# Patient Record
Sex: Male | Born: 1965 | Race: White | Hispanic: No | Marital: Married | Smoking: Never smoker
Health system: Southern US, Community
[De-identification: ages and names within clinical notes are randomized; demographics above are authoritative.]

## PROBLEM LIST (undated history)

## (undated) DIAGNOSIS — N400 Enlarged prostate without lower urinary tract symptoms: Secondary | ICD-10-CM

## (undated) DIAGNOSIS — R51 Headache: Secondary | ICD-10-CM

## (undated) DIAGNOSIS — Z9109 Other allergy status, other than to drugs and biological substances: Secondary | ICD-10-CM

## (undated) DIAGNOSIS — K649 Unspecified hemorrhoids: Secondary | ICD-10-CM

## (undated) DIAGNOSIS — I2699 Other pulmonary embolism without acute cor pulmonale: Secondary | ICD-10-CM

## (undated) DIAGNOSIS — J40 Bronchitis, not specified as acute or chronic: Secondary | ICD-10-CM

## (undated) DIAGNOSIS — E663 Overweight: Secondary | ICD-10-CM

## (undated) DIAGNOSIS — M545 Low back pain, unspecified: Secondary | ICD-10-CM

## (undated) DIAGNOSIS — N529 Male erectile dysfunction, unspecified: Secondary | ICD-10-CM

## (undated) DIAGNOSIS — E119 Type 2 diabetes mellitus without complications: Secondary | ICD-10-CM

## (undated) DIAGNOSIS — E7800 Pure hypercholesterolemia, unspecified: Secondary | ICD-10-CM

## (undated) DIAGNOSIS — I82409 Acute embolism and thrombosis of unspecified deep veins of unspecified lower extremity: Secondary | ICD-10-CM

## (undated) DIAGNOSIS — I10 Essential (primary) hypertension: Secondary | ICD-10-CM

## (undated) DIAGNOSIS — J45909 Unspecified asthma, uncomplicated: Secondary | ICD-10-CM

## (undated) DIAGNOSIS — E291 Testicular hypofunction: Secondary | ICD-10-CM

## (undated) DIAGNOSIS — R519 Headache, unspecified: Secondary | ICD-10-CM

## (undated) HISTORY — DX: Other allergy status, other than to drugs and biological substances: Z91.09

## (undated) HISTORY — DX: Overweight: E66.3

## (undated) HISTORY — DX: Bronchitis, not specified as acute or chronic: J40

## (undated) HISTORY — DX: Benign prostatic hyperplasia without lower urinary tract symptoms: N40.0

## (undated) HISTORY — PX: CHOLECYSTECTOMY: SHX55

## (undated) HISTORY — DX: Testicular hypofunction: E29.1

## (undated) HISTORY — DX: Pure hypercholesterolemia, unspecified: E78.00

## (undated) HISTORY — DX: Essential (primary) hypertension: I10

## (undated) HISTORY — DX: Low back pain: M54.5

## (undated) HISTORY — DX: Unspecified asthma, uncomplicated: J45.909

## (undated) HISTORY — DX: Low back pain, unspecified: M54.50

## (undated) HISTORY — DX: Unspecified hemorrhoids: K64.9

## (undated) HISTORY — DX: Male erectile dysfunction, unspecified: N52.9

## (undated) HISTORY — DX: Type 2 diabetes mellitus without complications: E11.9

## (undated) SURGERY — AMPUTATION BELOW KNEE
Anesthesia: General | Site: Knee | Laterality: Right

---

## 1990-05-20 HISTORY — PX: BACK SURGERY: SHX140

## 2000-09-02 ENCOUNTER — Encounter: Admission: RE | Admit: 2000-09-02 | Discharge: 2000-09-02 | Payer: Self-pay | Admitting: Infectious Diseases

## 2007-02-09 ENCOUNTER — Ambulatory Visit: Payer: Self-pay | Admitting: Orthopaedic Surgery

## 2007-02-26 ENCOUNTER — Ambulatory Visit: Payer: Self-pay | Admitting: Unknown Physician Specialty

## 2007-03-21 ENCOUNTER — Ambulatory Visit: Payer: Self-pay | Admitting: Unknown Physician Specialty

## 2007-04-20 ENCOUNTER — Ambulatory Visit: Payer: Self-pay | Admitting: Unknown Physician Specialty

## 2007-05-21 ENCOUNTER — Ambulatory Visit: Payer: Self-pay | Admitting: Unknown Physician Specialty

## 2008-06-15 ENCOUNTER — Ambulatory Visit: Payer: Self-pay | Admitting: Family Medicine

## 2008-07-22 ENCOUNTER — Ambulatory Visit: Payer: Self-pay | Admitting: Gastroenterology

## 2008-08-30 ENCOUNTER — Ambulatory Visit: Payer: Self-pay | Admitting: Gastroenterology

## 2008-10-17 ENCOUNTER — Ambulatory Visit: Payer: Self-pay | Admitting: Unknown Physician Specialty

## 2008-10-18 ENCOUNTER — Ambulatory Visit: Payer: Self-pay | Admitting: Unknown Physician Specialty

## 2010-03-28 ENCOUNTER — Ambulatory Visit: Payer: Self-pay | Admitting: Unknown Physician Specialty

## 2011-06-27 ENCOUNTER — Ambulatory Visit: Payer: Self-pay | Admitting: Gastroenterology

## 2011-07-01 ENCOUNTER — Ambulatory Visit: Payer: Self-pay | Admitting: Gastroenterology

## 2011-07-30 ENCOUNTER — Ambulatory Visit: Payer: Self-pay | Admitting: Gastroenterology

## 2011-11-05 ENCOUNTER — Ambulatory Visit: Payer: Self-pay | Admitting: Family Medicine

## 2011-12-06 ENCOUNTER — Ambulatory Visit: Payer: Self-pay | Admitting: Gastroenterology

## 2011-12-30 ENCOUNTER — Ambulatory Visit: Payer: Self-pay | Admitting: Surgery

## 2011-12-30 DIAGNOSIS — I10 Essential (primary) hypertension: Secondary | ICD-10-CM

## 2012-01-06 ENCOUNTER — Ambulatory Visit: Payer: Self-pay | Admitting: Surgery

## 2012-01-07 LAB — PATHOLOGY REPORT

## 2012-04-09 ENCOUNTER — Ambulatory Visit: Payer: Self-pay | Admitting: Pain Medicine

## 2012-05-06 ENCOUNTER — Ambulatory Visit: Payer: Self-pay | Admitting: Family Medicine

## 2012-07-06 ENCOUNTER — Other Ambulatory Visit: Payer: Self-pay | Admitting: Neurosurgery

## 2012-07-06 DIAGNOSIS — M792 Neuralgia and neuritis, unspecified: Secondary | ICD-10-CM

## 2012-07-10 ENCOUNTER — Ambulatory Visit
Admission: RE | Admit: 2012-07-10 | Discharge: 2012-07-10 | Disposition: A | Discharge status: Home / Self Care | Payer: Medicare Other | Source: Ambulatory Visit | Attending: Neurosurgery | Admitting: Neurosurgery

## 2012-07-10 DIAGNOSIS — M792 Neuralgia and neuritis, unspecified: Secondary | ICD-10-CM

## 2012-07-10 MED ORDER — GADOBENATE DIMEGLUMINE 529 MG/ML IV SOLN
20.0000 mL | Freq: Once | INTRAVENOUS | Status: AC | PRN
  Administered 2012-07-10: 20 mL via INTRAVENOUS

## 2012-08-14 ENCOUNTER — Other Ambulatory Visit: Payer: Self-pay | Admitting: Neurosurgery

## 2012-08-14 DIAGNOSIS — M5417 Radiculopathy, lumbosacral region: Secondary | ICD-10-CM

## 2012-08-14 DIAGNOSIS — M545 Low back pain, unspecified: Secondary | ICD-10-CM

## 2012-08-14 DIAGNOSIS — M479 Spondylosis, unspecified: Secondary | ICD-10-CM

## 2012-08-20 ENCOUNTER — Other Ambulatory Visit: Payer: Medicare Other

## 2012-08-24 ENCOUNTER — Ambulatory Visit
Admission: RE | Admit: 2012-08-24 | Discharge: 2012-08-24 | Disposition: A | Discharge status: Home / Self Care | Payer: Medicare Other | Source: Ambulatory Visit | Attending: Neurosurgery | Admitting: Neurosurgery

## 2012-08-24 VITALS — BP 131/77 | HR 86 | Ht 70.0 in | Wt 264.0 lb

## 2012-08-24 DIAGNOSIS — M545 Low back pain, unspecified: Secondary | ICD-10-CM

## 2012-08-24 DIAGNOSIS — M5417 Radiculopathy, lumbosacral region: Secondary | ICD-10-CM

## 2012-08-24 DIAGNOSIS — M479 Spondylosis, unspecified: Secondary | ICD-10-CM

## 2012-08-24 MED ORDER — IOHEXOL 180 MG/ML  SOLN
18.0000 mL | Freq: Once | INTRAMUSCULAR | Status: AC | PRN
  Administered 2012-08-24: 18 mL via INTRATHECAL

## 2012-08-24 MED ORDER — DIAZEPAM 5 MG PO TABS
10.0000 mg | ORAL_TABLET | Freq: Once | ORAL | Status: AC
  Administered 2012-08-24: 10 mg via ORAL

## 2013-06-10 ENCOUNTER — Ambulatory Visit: Payer: Self-pay | Admitting: Pain Medicine

## 2013-06-18 ENCOUNTER — Ambulatory Visit: Payer: Self-pay | Admitting: Pain Medicine

## 2013-06-22 ENCOUNTER — Ambulatory Visit: Payer: Self-pay | Admitting: Pain Medicine

## 2013-07-08 ENCOUNTER — Ambulatory Visit: Payer: Self-pay | Admitting: Pain Medicine

## 2013-07-16 ENCOUNTER — Ambulatory Visit: Payer: Self-pay | Admitting: Orthopedic Surgery

## 2013-08-05 ENCOUNTER — Ambulatory Visit: Payer: Self-pay | Admitting: Pain Medicine

## 2013-08-16 ENCOUNTER — Ambulatory Visit: Payer: Self-pay | Admitting: Pain Medicine

## 2013-08-25 DIAGNOSIS — I10 Essential (primary) hypertension: Secondary | ICD-10-CM | POA: Insufficient documentation

## 2013-08-25 DIAGNOSIS — Z8719 Personal history of other diseases of the digestive system: Secondary | ICD-10-CM | POA: Insufficient documentation

## 2013-08-25 DIAGNOSIS — F32A Depression, unspecified: Secondary | ICD-10-CM | POA: Insufficient documentation

## 2013-08-25 DIAGNOSIS — M545 Low back pain, unspecified: Secondary | ICD-10-CM | POA: Insufficient documentation

## 2013-09-14 ENCOUNTER — Ambulatory Visit: Payer: Self-pay | Admitting: Pain Medicine

## 2013-09-27 ENCOUNTER — Ambulatory Visit: Payer: Self-pay | Admitting: Pain Medicine

## 2013-10-12 ENCOUNTER — Ambulatory Visit: Payer: Self-pay | Admitting: Pain Medicine

## 2013-10-25 ENCOUNTER — Ambulatory Visit: Payer: Self-pay | Admitting: Pain Medicine

## 2013-11-11 ENCOUNTER — Ambulatory Visit: Payer: Self-pay | Admitting: Pain Medicine

## 2013-12-09 ENCOUNTER — Ambulatory Visit: Payer: Self-pay | Admitting: Pain Medicine

## 2014-01-11 ENCOUNTER — Ambulatory Visit: Payer: Self-pay | Admitting: Pain Medicine

## 2014-02-09 ENCOUNTER — Ambulatory Visit: Payer: Self-pay | Admitting: Pain Medicine

## 2014-03-10 ENCOUNTER — Ambulatory Visit: Payer: Self-pay | Admitting: Pain Medicine

## 2014-04-12 ENCOUNTER — Ambulatory Visit: Payer: Self-pay | Admitting: Pain Medicine

## 2014-05-18 ENCOUNTER — Ambulatory Visit: Payer: Self-pay | Admitting: Pain Medicine

## 2014-06-14 ENCOUNTER — Ambulatory Visit: Payer: Self-pay | Admitting: Pain Medicine

## 2014-07-14 ENCOUNTER — Ambulatory Visit: Payer: Self-pay | Admitting: Pain Medicine

## 2014-08-11 ENCOUNTER — Ambulatory Visit: Payer: Self-pay | Admitting: Pain Medicine

## 2014-09-06 NOTE — Op Note (Signed)
PATIENT NAME:  Gerald Ellis, Gerald Ellis MR#:  [id number] DATE OF BIRTH:  [birth year]  DATE OF PROCEDURE:  01/06/2012  PREOPERATIVE DIAGNOSIS: Chronic cholecystitis.   POSTOPERATIVE DIAGNOSIS: Chronic cholecystitis, cholelithiasis.   PROCEDURE: Laparoscopic cholecystectomy.   SURGEON: Loreli Dollar, MD  ANESTHESIA: General.   INDICATIONS: This 49 year old male has prolonged history of nausea, abnormally low gallbladder ejection fraction of 5% and surgery was recommended for definitive treatment.   DESCRIPTION OF PROCEDURE: The patient was placed on the operating table in the supine position under general anesthesia. The abdomen was clipped and prepared with ChloraPrep, draped in a sterile manner.   A short incision was made in the inferior aspect of the umbilicus, carried down to the deep fascia which was grasped with laryngeal hook and elevated. A Veress needle was inserted, aspirated, and irrigated with a saline solution. Next, the peritoneal cavity was inflated with carbon dioxide. The Veress needle was removed. The 10 mm cannula was inserted. The 10 mm, 0 degrees laparoscope was inserted to view the peritoneal cavity. Another incision was made in the epigastrium slightly to the right of the midline to introduce an 11 mm cannula. Two incisions were made in the lateral aspect of the right upper quadrant to introduce two 5 mm cannulas.   Initial inspection revealed a fatty liver although the liver surface was smooth. The immediate omentum was noted and small portion of the stomach seen, appeared normal. Patient was turned to the reverse Trendelenburg position and turned some 5 degrees to the left. The gallbladder was retracted towards the right shoulder. Did have a thickened wall. A number of adhesions were taken down with blunt dissection. The infundibulum was retracted inferiorly and laterally. The porta hepatis was demonstrated. There was a large amount of fatty tissue attached to the neck of the  gallbladder and this fatty tissue was dissected away from the gallbladder exposing the cystic duct which was dissected free from surrounding structures. Also, the cystic artery was dissected free from surrounding structures. The gallbladder was further mobilized with incision of the visceral peritoneum. A critical view of safety was demonstrated. An endoclip was placed across the cystic duct. An incision was made in the cystic duct. An effort was made to thread in the Reddick catheter, however, the cystic duct appeared to be small in size and the Reddick catheter would not thread in therefore cholangiogram was not done. The Reddick catheter was removed. The cystic duct was doubly ligated with endoclips and divided. The cystic artery was controlled with endoclip and divided. The gallbladder was dissected free from the liver with hook and cautery. Bleeding was very minimal. Small amount of blood was aspirated. Hemostasis subsequently appeared to be intact. The gallbladder was brought up through the infraumbilical incision and opened and suctioned and with traction was removed. There was one small palpable stone within the gallbladder and the gallbladder was submitted in formalin for routine pathology. The right upper quadrant was further inspected. Hemostasis was intact. The cannulas were removed. Carbon dioxide allowed to escape from the peritoneal cavity. The fascial defect at the umbilicus was closed with a 0 Vicryl simple suture. The skin incisions were closed with interrupted 5-0 chromic subcuticular sutures, benzoin, and Steri-Strips. Dressings were applied with paper tape. The patient tolerated surgery satisfactorily and was then prepared for transfer to the recovery room.   ____________________________ Lenna Sciara. Rochel Brome, MD jws:cms D: 01/06/2012 09:03:25 ET T: 01/06/2012 11:03:36 ET JOB#: [id number]  cc: Loreli Dollar, MD, <Dictator> Loreli Dollar MD  ELECTRONICALLY SIGNED 01/09/2012 20:19

## 2014-09-13 ENCOUNTER — Ambulatory Visit
Admit: 2014-09-13 | Disposition: A | Discharge status: Home / Self Care | Payer: Self-pay | Attending: Pain Medicine | Admitting: Pain Medicine

## 2014-10-13 ENCOUNTER — Encounter: Payer: Self-pay | Admitting: Pain Medicine

## 2014-10-13 ENCOUNTER — Ambulatory Visit: Payer: Medicare Other | Attending: Pain Medicine | Admitting: Pain Medicine

## 2014-10-13 VITALS — BP 154/102 | HR 100 | Temp 98.3°F | Resp 18 | Ht 68.0 in | Wt 255.0 lb

## 2014-10-13 DIAGNOSIS — M4806 Spinal stenosis, lumbar region: Secondary | ICD-10-CM | POA: Insufficient documentation

## 2014-10-13 DIAGNOSIS — M5481 Occipital neuralgia: Secondary | ICD-10-CM

## 2014-10-13 DIAGNOSIS — Z9889 Other specified postprocedural states: Secondary | ICD-10-CM

## 2014-10-13 DIAGNOSIS — M19011 Primary osteoarthritis, right shoulder: Secondary | ICD-10-CM

## 2014-10-13 DIAGNOSIS — M19019 Primary osteoarthritis, unspecified shoulder: Secondary | ICD-10-CM | POA: Diagnosis not present

## 2014-10-13 DIAGNOSIS — M5136 Other intervertebral disc degeneration, lumbar region: Secondary | ICD-10-CM | POA: Diagnosis not present

## 2014-10-13 DIAGNOSIS — F329 Major depressive disorder, single episode, unspecified: Secondary | ICD-10-CM | POA: Insufficient documentation

## 2014-10-13 DIAGNOSIS — M5030 Other cervical disc degeneration, unspecified cervical region: Secondary | ICD-10-CM

## 2014-10-13 DIAGNOSIS — M5416 Radiculopathy, lumbar region: Secondary | ICD-10-CM | POA: Insufficient documentation

## 2014-10-13 DIAGNOSIS — M542 Cervicalgia: Secondary | ICD-10-CM | POA: Diagnosis present

## 2014-10-13 DIAGNOSIS — M51369 Other intervertebral disc degeneration, lumbar region without mention of lumbar back pain or lower extremity pain: Secondary | ICD-10-CM | POA: Insufficient documentation

## 2014-10-13 DIAGNOSIS — M545 Low back pain: Secondary | ICD-10-CM | POA: Diagnosis present

## 2014-10-13 MED ORDER — TRAMADOL HCL 50 MG PO TABS: ORAL_TABLET | ORAL | Status: DC

## 2014-10-13 MED ORDER — HYDROCODONE-ACETAMINOPHEN 5-325 MG PO TABS: ORAL_TABLET | ORAL | Status: DC

## 2014-10-13 NOTE — Patient Instructions (Addendum)
Continue present medications.  F/U PCP for evaliation of  BP and general medical  condition.. Please see your primary care physician today or this week for evaluation of blood pressure which is elevated  F/U surgical evaluation.. We have scheduled orthopedic evaluation of your hand. Please ask date of your appointment  F/U neurological evaluation.  May consider radiofrequency rhizolysis or intraspinal procedures pending response to present treatment and F/U evaluation.  [facility] to [facility] should patient have concerns prior to scheduled return appointment.  Marland Kitchenpm

## 2014-10-13 NOTE — Progress Notes (Signed)
Subjective:    Patient ID: Gerald Ellis, male    DOB: 1966/01/12, 49 y.o.   MRN: [id number]  HPI   patient is 49 year old gentleman [facility] further evaluation and treatment of pain involving the neck N regions and lower back lower extremity region. We discussed patient's condition including his blood pressure noted to be elevated on today's visit and patient stated he had not taken his antihypertensive medications. He also discuss patient's element of depression and proceed with scheduling patient for psych evaluation. The patient has been involved in litigation for very long time regarding his lower back lower extremity pain with there being discussion Re: Need for additional surgery or there being no need for additional surgery. This time we will avoid interventional treatment and continue patient's medications as prescribed as well as proceed with site evaluation and patient will follow up with primary care physician regarding his elevated blood pressure which she states is due to his not taking his medications on today's visit. The patient was understanding and agree with suggested treatment plan.      Review of Systems     Objective:   Physical Exam     palpation over the region of the cervical region cervical facet region reproduced pain of mild to moderate degree with mild to moderate tends of the splenius capitis and occipitalis muscles. Palpation of the acromioclavicular and glenohumeral joint region reproduced pain of mild to moderate degree with grip strength is slightly decreased. There was no crepitus of the thoracic region noted. Palpation over the thoracic muscles and cervical paraspinal musculature region reproduced mild to moderate discomfort.  Tinel and Phalen's maneuver without increased pain of significant degree. Patient of the lumbar paraspinal muscles and lumbar facet region reproduced severe disabling pain with limited range of motion  of the lumbar spine flexion-extension rotation and lateral bending all significantly reduced. Leg raising tolerates approximately 20 with without without a definite increase of pain with dorsiflexion noted. There was negative clonus negative Homans. There was question decreased sensation of the L5 dermatomal distribution. Decreased EHL strength noted. There was tennis over the PSIS and PII S region of moderate degree. Abdomen nontender no costovertebral angle tenderness noted.      Assessment & Plan:     degenerative disc disease of the cervical spine  Cervical facet syndrome  Occipital neuralgia   Degenerative disc disease lumbar spine  L3-4 or L4-5 and L5-S1 foraminal encroachment on the right L4-5 and L5-S1 levels. Prior L3 for bilateral laminectomy and L4-5 central stenosis and bilateral lateral recess stenosis, multifactorial L4-5 right lateral stenosis potentially affecting the S1 nerve root. status post surgical intervention of lumbar region   degenerative joint disease of shoulder  Relatively mildly displaced SLAP tear which extends into the biceps ankle or with fluid signal intensity between the subacromial and deltoid bursa and glenohumeral joint between the supraspinatus and infraspinatus tenderness suspicious for posterior supraspinatus full-thickness tear, partial tear and also insertional partial partial with tearing of the supraspinatus and infraspinatus along the articular surface with moderate supraspinatus and infraspinatus subscapularis tendinopathy   depression     Plan  Continue present medications.  F/U PCP for evaliation of  BP and general medical  Condition.   psych evaluation for elements of depression was scheduled today  F/U surgical evaluation.  F/U neurological evaluation.  May consider radiofrequency rhizolysis or intraspinal procedures pending response to present treatment and F/U evaluation.  [facility] to call [facility] should patient  have concerns prior to scheduled return appointment.

## 2014-10-13 NOTE — Progress Notes (Signed)
Discharged to home, ambulatory with script for tramadol and hydrocodone at 1134

## 2014-11-11 ENCOUNTER — Encounter: Payer: Self-pay | Admitting: *Deleted

## 2014-11-11 ENCOUNTER — Other Ambulatory Visit: Payer: Self-pay | Admitting: *Deleted

## 2014-11-15 ENCOUNTER — Ambulatory Visit: Payer: Medicare Other | Attending: Pain Medicine | Admitting: Pain Medicine

## 2014-11-15 VITALS — BP 136/93 | HR 106 | Temp 99.1°F | Resp 16 | Ht 70.0 in | Wt 255.0 lb

## 2014-11-15 DIAGNOSIS — M19019 Primary osteoarthritis, unspecified shoulder: Secondary | ICD-10-CM | POA: Diagnosis not present

## 2014-11-15 DIAGNOSIS — F419 Anxiety disorder, unspecified: Secondary | ICD-10-CM | POA: Insufficient documentation

## 2014-11-15 DIAGNOSIS — M5126 Other intervertebral disc displacement, lumbar region: Secondary | ICD-10-CM | POA: Diagnosis not present

## 2014-11-15 DIAGNOSIS — M5136 Other intervertebral disc degeneration, lumbar region: Secondary | ICD-10-CM

## 2014-11-15 DIAGNOSIS — M5481 Occipital neuralgia: Secondary | ICD-10-CM | POA: Insufficient documentation

## 2014-11-15 DIAGNOSIS — M5030 Other cervical disc degeneration, unspecified cervical region: Secondary | ICD-10-CM

## 2014-11-15 DIAGNOSIS — M4806 Spinal stenosis, lumbar region: Secondary | ICD-10-CM | POA: Insufficient documentation

## 2014-11-15 DIAGNOSIS — Z9889 Other specified postprocedural states: Secondary | ICD-10-CM

## 2014-11-15 DIAGNOSIS — M542 Cervicalgia: Secondary | ICD-10-CM | POA: Diagnosis present

## 2014-11-15 DIAGNOSIS — M19011 Primary osteoarthritis, right shoulder: Secondary | ICD-10-CM

## 2014-11-15 DIAGNOSIS — F329 Major depressive disorder, single episode, unspecified: Secondary | ICD-10-CM | POA: Diagnosis not present

## 2014-11-15 DIAGNOSIS — M5416 Radiculopathy, lumbar region: Secondary | ICD-10-CM

## 2014-11-15 DIAGNOSIS — M79601 Pain in right arm: Secondary | ICD-10-CM | POA: Diagnosis present

## 2014-11-15 DIAGNOSIS — M79602 Pain in left arm: Secondary | ICD-10-CM | POA: Diagnosis present

## 2014-11-15 MED ORDER — TRAMADOL HCL 50 MG PO TABS: ORAL_TABLET | ORAL | Status: DC

## 2014-11-15 MED ORDER — HYDROCODONE-ACETAMINOPHEN 5-325 MG PO TABS: ORAL_TABLET | ORAL | Status: DC

## 2014-11-15 NOTE — Progress Notes (Signed)
   Subjective:    Patient ID: Gerald Ellis, male    DOB: April 27, 1966, 49 y.o.   MRN: [id number]  HPI  Patient is 49 year old gentleman returns [facility] for further evaluation and treatment of pain involving the neck upper extremity regions headache lower back and lower extremity pain. Patient is continuing his negotiations with insurance company regarding need for more surgery of the lumbar region. Patient continues litigation in this regard. As patient's overall condition and patient appears to be with elements of depression and we will proceed with psych evaluation and will continue present medications of hydrocodone acetaminophen and tramadol. We will avoid interventional treatment as discussed with patient. The patient was understanding and in agreement with suggested treatment plan.     Review of Systems     Objective:   Physical Exam  There was tenderness over the splenius capitis and occipitalis musculature region of mild degree no new lesions of the head and neck were noted. There was tenderness over the cervical facet cervical paraspinal musculature region and thoracic facet thoracic paraspinal musculature region of moderate degree. Palpation of the acromioclavicular glenohumeral joint region was a tends to palpation of moderate degree with limited range of motion of the shoulders. Patient had some difficulty performing the drop test. Palpation over the thoracic facet thoracic paraspinal musculature region was a tends to palpation of mild degree with no crepitus of the thoracic region noted. Palpation over the lumbar paraspinal muscles region lumbar facet region was severely limited with extension and palpation of the lumbar facets reproducing severe discomfort. Straight leg raising was tolerates approximately 20 without an increase of pain with dorsiflexion noted. EHL strength appeared to be decreased definite sensory deficit of dermatomal distribution was detected and  there was negative clonus negative Homans. DTRs are difficult to elicit patient had developed relaxing. Mild tenderness of the greater trochanteric region iliotibial band region. Abdomen nontender and no costovertebral angle tenderness noted.      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  Degenerative joint disease of shoulder Mildly displaced SLAP tear that extends into the biceps anchor with fluid signal intensity between these's of arachnoid deltoid bursa and the glenohumeral joint between the supraspinatus and infraspinatus tendons suspicious for a posterior supraspinatus full-thickness tear, partial tear and was also insertional partial with tearing of the supraspinatus and infraspinatus along the articular surface with moderate supraspinatus and if spinatus subscapularis tendinopathy  Bilateral greater occipital neuralgia  Depression/Anxiety      Plan    Continue present medications tramadol and hydrocodone acetaminophen  F/U PCP Dr. Kary Kos for evaliation of  BP and general medical  condition.  F/U surgical evaluation  F/U neurological evaluation  Psych evaluation scheduled for evaluation of elements of depression/anxiety. We feel that psych evaluation including psych medications prescribed may be helpful in terms of reducing patient's pain and severity of symptoms  May consider radiofrequency rhizolysis or intraspinal procedures pending response to present treatment and F/U evaluation.  Patient to call [facility] should patient have concerns prior to scheduled return appointment.

## 2014-11-15 NOTE — Patient Instructions (Addendum)
Continue present medications tramadol and hydrocodone acetaminophen  F/U PCP for evaliation of  BP and general medical  condition.  F/U surgical evaluation.  F/U neurological evaluation.  Please obtain the date for your psych evaluation prior to discharge today  May consider radiofrequency rhizolysis or intraspinal procedures pending response to present treatment and F/U evaluation.  Patient to call [facility] should patient have concerns prior to scheduled return appointment.

## 2014-11-15 NOTE — Progress Notes (Signed)
Safety precautions to be maintained throughout the outpatient stay will include: orient to surroundings, keep bed in low position, maintain call bell within reach at all times, provide assistance with transfer out of bed and ambulation.  

## 2014-11-22 ENCOUNTER — Ambulatory Visit (INDEPENDENT_AMBULATORY_CARE_PROVIDER_SITE_OTHER): Payer: Self-pay | Admitting: Urology

## 2014-11-22 ENCOUNTER — Encounter: Payer: Self-pay | Admitting: Urology

## 2014-11-22 VITALS — BP 162/103 | HR 52 | Ht 70.0 in | Wt 234.0 lb

## 2014-11-22 DIAGNOSIS — N138 Other obstructive and reflux uropathy: Secondary | ICD-10-CM | POA: Insufficient documentation

## 2014-11-22 DIAGNOSIS — E291 Testicular hypofunction: Secondary | ICD-10-CM | POA: Insufficient documentation

## 2014-11-22 DIAGNOSIS — N401 Enlarged prostate with lower urinary tract symptoms: Secondary | ICD-10-CM

## 2014-11-22 DIAGNOSIS — N529 Male erectile dysfunction, unspecified: Secondary | ICD-10-CM | POA: Insufficient documentation

## 2014-11-22 DIAGNOSIS — N528 Other male erectile dysfunction: Secondary | ICD-10-CM

## 2014-11-22 LAB — BLADDER SCAN AMB NON-IMAGING: SCAN RESULT: 30

## 2014-11-22 MED ORDER — TESTOSTERONE CYPIONATE 200 MG/ML IM SOLN
200.0000 mg | Freq: Once | INTRAMUSCULAR | Status: AC
  Administered 2014-11-22: 200 mg via INTRAMUSCULAR

## 2014-11-22 MED ORDER — VARDENAFIL HCL 10 MG PO TABS: 10.0000 mg | ORAL_TABLET | Freq: Every day | ORAL | Status: DC | PRN

## 2014-11-22 NOTE — Progress Notes (Signed)
Testosterone IM Injection  Due to Hypogonadism patient is present today for a Testosterone Injection.  Medication: Testosterone Cypionate Dose: 24mL Location: right upper outer buttocks Lot: [id number] Exp:11/2015  Patient tolerated well, no complications were noted  Preformed by: Toniann Fail, LPN   Follow up: 3 weeks

## 2014-11-22 NOTE — Progress Notes (Signed)
11/22/2014 10:26 AM   [name] 05/08/1966 [id number]  Referring provider: No referring provider defined for this encounter.  Chief Complaint  Patient presents with  . Benign Prostatic Hypertrophy    6 month follow up  . Hypogonadism    HPI: Mr. Sereno is a 49 year old white male with hypogonadism, erectile dysfunction and BPH with LUTS who presents today for his biannual office visit.  Today, his IPS S score is 6/3, which is mild symptomatology. His PVR today is 30 mL.  He is not experiencing any dysuria, suprapubic pain or gross hematuria. He also denies any fevers, chills, nausea or vomiting.  PSA history:    0.3 ng/mL on 01/28/2013    0.5 ng/mL on 06/28/2013    0.3 ng/mL on 11/18/2013    0.4 ng/mL on 05/23/2014     IPSS      11/22/14 1000       International Prostate Symptom Score   How often have you had the sensation of not emptying your bladder? Not at All     How often have you had to urinate less than every two hours? About half the time     How often have you found you stopped and started again several times when you urinated? Not at All     How often have you found it difficult to postpone urination? Not at All     How often have you had a weak urinary stream? Not at All     How often have you had to strain to start urination? Not at All     How many times did you typically get up at night to urinate? None     Total IPSS Score 3     Quality of Life due to urinary symptoms   If you were to spend the rest of your life with your urinary condition just the way it is now how would you feel about that? Delighted        Score:  1-7 Mild 8-19 Moderate 20-35 Severe   Patient's hypogonadism is currently being treated with testosterone cypionate injections receiving 400 mg IM every 3 weeks. His last injection was the end of May.      Androgen Deficiency in the Aging Male      11/22/14 1000       Androgen Deficiency in the Aging Male   Do you  have a decrease in libido (sex drive) No     Do you have lack of energy Yes     Do you have a decrease in strength and/or endurance Yes     Have you lost height Yes     Have you noticed a decreased "enjoyment of life" Yes     Are you sad and/or grumpy Yes     Are your erections less strong Yes     Have you noticed a recent deterioration in your ability to play sports Yes     Are you falling asleep after dinner No     Has there been a recent deterioration in your work performance Yes        Patient's SH IM score today is 11. This is a moderate erectile dysfunction. He responds well to PDE 5 inhibitors, but he finds them cost prohibitive. His current sexual partner, his wife, is not interested in sexual activity.     SHIM      11/22/14 1017       SHIM: Over the last 6 months:  How do you rate your confidence that you could get and keep an erection? Low     When you had erections with sexual stimulation, how often were your erections hard enough for penetration (entering your partner)? A Few Times (much less than half the time)     During sexual intercourse, how often were you able to maintain your erection after you had penetrated (entered) your partner? Very Difficult     During sexual intercourse, how difficult was it to maintain your erection to completion of intercourse? Very Difficult     When you attempted sexual intercourse, how often was it satisfactory for you? Difficult     SHIM Total Score   SHIM 11        Score: 1-7 Severe ED 8-11 Moderate ED 12-16 Mild-Moderate ED 17-21 Mild ED 22-25 No ED   PMH: Past Medical History  Diagnosis Date  . Hypercholesteremia   . Hypertension   . Diabetes mellitus without complication   . Hemorrhoids   . Environmental allergies   . Lumbago   . Childhood asthma   . Hypogonadism in male   . Over weight   . Benign enlargement of prostate   . Erectile dysfunction     Surgical History: Past Surgical History  Procedure  Laterality Date  . Cholecystectomy    . Back surgery  1992    Home Medications:    Medication List       This list is accurate as of: 11/22/14 10:26 AM.  Always use your most recent med list.               cyclobenzaprine 10 MG tablet  Commonly known as:  FLEXERIL  Take 10 mg by mouth at bedtime.     DHA-EPA-VITAMIN E PO  Take by mouth.     diclofenac sodium 1 % Gel  Commonly known as:  VOLTAREN  Apply 2-4 g topically 4 (four) times daily.     fluticasone 50 MCG/ACT nasal spray  Commonly known as:  FLONASE  Place 1 spray into both nostrils daily.     FREESTYLE LITE test strip  Generic drug:  glucose blood  1 strip 3 (three) times daily before meals.     gabapentin 300 MG capsule  Commonly known as:  NEURONTIN  Take 300 mg by mouth at bedtime. 2 capsules at bedtime     glipiZIDE 10 MG tablet  Commonly known as:  GLUCOTROL  Take 10 mg by mouth daily before breakfast.     HYDROcodone-acetaminophen 5-325 MG per tablet  Commonly known as:  NORCO/VICODIN  Limit 2-3 tabs per mouth per day if tolerated     insulin aspart 100 UNIT/ML injection  Commonly known as:  novoLOG  Inject into the skin.     insulin detemir 100 UNIT/ML injection  Commonly known as:  LEVEMIR  Inject 40 Units into the skin at bedtime.     insulin lispro 100 UNIT/ML injection  Commonly known as:  HUMALOG  Inject 10 Units into the skin 3 (three) times daily before meals.     insulin NPH Human 100 UNIT/ML injection  Commonly known as:  HUMULIN N,NOVOLIN N  Inject into the skin.     insulin regular 100 units/mL injection  Commonly known as:  NOVOLIN R,HUMULIN R  14 units three times a day. Take 10 minutes before meals.     lisinopril 20 MG tablet  Commonly known as:  PRINIVIL,ZESTRIL  Take 20 mg by mouth 2 (two) times  daily.     metFORMIN 1000 MG tablet  Commonly known as:  GLUCOPHAGE  Take 1,000 mg by mouth 2 (two) times daily with a meal.     MULTI-VITAMINS Tabs  Take by mouth.       pantoprazole 40 MG tablet  Commonly known as:  PROTONIX  TAKE 1 TABLET BY MOUTH ONCE A DAY     pioglitazone 45 MG tablet  Commonly known as:  ACTOS  Take 45 mg by mouth daily.     simvastatin 40 MG tablet  Commonly known as:  ZOCOR  Take 40 mg by mouth daily.     testosterone cypionate 200 MG/ML injection  Commonly known as:  DEPOTESTOSTERONE CYPIONATE  Inject 200 mg into the muscle every 21 ( twenty-one) days.     traMADol 50 MG tablet  Commonly known as:  ULTRAM  Limit 1 tab by mouth twice a day 2 to 3 times a day if tolerated        Allergies:  Allergies  Allergen Reactions  . Ibuprofen Shortness Of Breath    Family History: Family History  Problem Relation Age of Onset  . Heart disease Mother     CABG  . Cancer Mother     Disseminated  . Diabetes Father     mother  . Alzheimer's disease    . Hypertension Mother   . Hyperlipidemia Mother   . Kidney disease Neg Hx   . Prostate cancer Neg Hx     Social History:  reports that he has never smoked. He has never used smokeless tobacco. He reports that he drinks alcohol. He reports that he does not use illicit drugs.  ROS: Urological Symptom Review  Patient is experiencing the following symptoms: Frequent urination Get up at night to urinate Erection problems (male only)   Review of Systems  Gastrointestinal (upper)  : Negative for upper GI symptoms  Gastrointestinal (lower) : Negative for lower GI symptoms  Constitutional : Negative for symptoms  Skin: Negative for skin symptoms  Eyes: Negative for eye symptoms  Ear/Nose/Throat : Negative for Ear/Nose/Throat symptoms  Hematologic/Lymphatic: Negative for Hematologic/Lymphatic symptoms  Cardiovascular : Negative for cardiovascular symptoms  Respiratory : Negative for respiratory symptoms  Endocrine: Negative for endocrine symptoms  Musculoskeletal: Back pain Joint pain  Neurological: Negative for neurological  symptoms  Psychologic: Negative for psychiatric symptoms   Physical Exam: BP 162/103 mmHg  Pulse 52  Ht 5\' 10"  (1.778 m)  Wt 234 lb (106.142 kg)  BMI 33.58 kg/m2  GU: Patient with uncircumcised phallus. Foreskin easily retracted  Urethral meatus is patent.  No penile discharge. No penile lesions or rashes. Scrotum without lesions, cysts, rashes and/or edema.  Testicles are located scrotally bilaterally. No masses are appreciated in the testicles. Left and right epididymis are normal. Rectal: Patient with  normal sphincter tone. Perineum without scarring or rashes. No rectal masses are appreciated. Prostate is approximately 45 grams, no nodules are appreciated. Seminal vesicles are normal.  Laboratory Data: Results for orders placed or performed in visit on 11/22/14  BLADDER SCAN AMB NON-IMAGING  Result Value Ref Range   Scan Result 30    No results found for: WBC, HGB, HCT, MCV, PLT  No results found for: CREATININE  No results found for: PSA  No results found for: TESTOSTERONE  No results found for: HGBA1C  Urinalysis No results found for: COLORURINE, APPEARANCEUR, LABSPEC, PHURINE, GLUCOSEU, HGBUR, BILIRUBINUR, KETONESUR, PROTEINUR, UROBILINOGEN, NITRITE, LEUKOCYTESUR  Pertinent Imaging:   Assessment & Plan:  1. BPH (benign prostatic hyperplasia) with LUTS:   Patient's IPS S score is 6/0. His PVR is 30 mL. Recheck his IPS S score, PVR, DRE and PSA in 6 months time.  - PSA - BLADDER SCAN AMB NON-IMAGING  2. Hypogonadism in male:   Patient's  hypogonadism is currently being managed with testosterone cypionate injections. He is receiving 400 mg IM every 3 weeks. He will have his morning testosterone checked every 3 months. His hematocrit will be checked on a biannual basis when he presents for office visit for his exam.  - Hematocrit  3. Erectile dysfunction:   Patient's SHIM score is 11. He responds well to PDE5 inhibitors, but finds them cost prohibitive. His wife  is not interested in sexual activity. He does not desire a prescription for PDE5 inhibitors because of the cost and his wife's lack of interest at this time.   Patient requested a presciprtion of Levitra after he received his injection.  No Follow-up on file.  Zara Council, [facility] [address] [place], [place] [address] [phone]

## 2014-11-23 ENCOUNTER — Telehealth: Payer: Self-pay

## 2014-11-23 DIAGNOSIS — E291 Testicular hypofunction: Secondary | ICD-10-CM

## 2014-11-23 LAB — HEMATOCRIT: HEMATOCRIT: 44.2 % (ref 37.5–51.0)

## 2014-11-23 LAB — PSA: Prostate Specific Ag, Serum: 0.4 ng/mL (ref 0.0–4.0)

## 2014-11-23 NOTE — Telephone Encounter (Signed)
-----   Message from Nori Riis, PA-C sent at 11/23/2014  8:09 AM EDT ----- Labs are good.  We will need to check a morning testosterone ( 8am-10am) in three months.

## 2014-11-23 NOTE — Telephone Encounter (Signed)
Spoke with pt and made aware of lab results. Pt is going to call back to make a lab appt. Future orders have been placed in epic. Cw,lpn

## 2014-12-08 ENCOUNTER — Encounter: Payer: Self-pay | Admitting: Pain Medicine

## 2014-12-08 ENCOUNTER — Ambulatory Visit: Payer: Medicare Other | Attending: Pain Medicine | Admitting: Pain Medicine

## 2014-12-08 VITALS — BP 151/89 | HR 97 | Temp 98.6°F | Resp 16 | Ht 70.0 in | Wt 255.0 lb

## 2014-12-08 DIAGNOSIS — M5416 Radiculopathy, lumbar region: Secondary | ICD-10-CM

## 2014-12-08 DIAGNOSIS — M5481 Occipital neuralgia: Secondary | ICD-10-CM | POA: Diagnosis not present

## 2014-12-08 DIAGNOSIS — M19019 Primary osteoarthritis, unspecified shoulder: Secondary | ICD-10-CM | POA: Insufficient documentation

## 2014-12-08 DIAGNOSIS — M51369 Other intervertebral disc degeneration, lumbar region without mention of lumbar back pain or lower extremity pain: Secondary | ICD-10-CM

## 2014-12-08 DIAGNOSIS — M5030 Other cervical disc degeneration, unspecified cervical region: Secondary | ICD-10-CM

## 2014-12-08 DIAGNOSIS — M19012 Primary osteoarthritis, left shoulder: Secondary | ICD-10-CM

## 2014-12-08 DIAGNOSIS — M19011 Primary osteoarthritis, right shoulder: Secondary | ICD-10-CM

## 2014-12-08 DIAGNOSIS — M542 Cervicalgia: Secondary | ICD-10-CM | POA: Diagnosis present

## 2014-12-08 DIAGNOSIS — M5136 Other intervertebral disc degeneration, lumbar region: Secondary | ICD-10-CM | POA: Diagnosis not present

## 2014-12-08 DIAGNOSIS — F329 Major depressive disorder, single episode, unspecified: Secondary | ICD-10-CM | POA: Insufficient documentation

## 2014-12-08 DIAGNOSIS — M4806 Spinal stenosis, lumbar region: Secondary | ICD-10-CM | POA: Insufficient documentation

## 2014-12-08 DIAGNOSIS — F419 Anxiety disorder, unspecified: Secondary | ICD-10-CM | POA: Insufficient documentation

## 2014-12-08 DIAGNOSIS — Z9889 Other specified postprocedural states: Secondary | ICD-10-CM

## 2014-12-08 DIAGNOSIS — M545 Low back pain: Secondary | ICD-10-CM | POA: Diagnosis present

## 2014-12-08 DIAGNOSIS — R51 Headache: Secondary | ICD-10-CM | POA: Diagnosis present

## 2014-12-08 MED ORDER — TRAMADOL HCL 50 MG PO TABS: ORAL_TABLET | ORAL | Status: DC

## 2014-12-08 MED ORDER — HYDROCODONE-ACETAMINOPHEN 5-325 MG PO TABS: ORAL_TABLET | ORAL | Status: DC

## 2014-12-08 NOTE — Progress Notes (Signed)
Safety precautions to be maintained throughout the outpatient stay will include: orient to surroundings, keep bed in low position, maintain call bell within reach at all times, provide assistance with transfer out of bed and ambulation.  

## 2014-12-08 NOTE — Patient Instructions (Signed)
Continue present medications tramadol and hydrocodone acetaminophen   F/U PCP  Dr. Kary Kos  for evaliation of  BP and general medical  condition.  F/U surgical evaluation as discussed  F/U neurological evaluation  May consider radiofrequency rhizolysis or intraspinal procedures pending response to present treatment and F/U evaluation.  [facility] to [facility] should patient have concerns prior to scheduled return appointment.

## 2014-12-08 NOTE — Progress Notes (Signed)
Subjective:    Patient ID: Gerald Ellis, male    DOB: [birth year], 49 y.o.   MRN: [id number]  HPI  Patient is 49 year old gentleman [facility] further evaluation and treatment of pain involving the neck headaches pain of the upper mid and lower back regions and lower extremity regions. Patient is without significant change in condition at this time. Patient is continuing to undergo evaluation with there being concern regarding the need for additional surgery versus the need to avoid additional surgery. Patient has been involved in litigation in this regard. Patient denies any trauma change in events of daily living the call significant change in symptomatology. We will continue presently prescribed medications at this time and will remain available to consider modification of treatment should there be significant change in patient's condition. The patient was understanding and in agreement with suggested treatment plan.    Review of Systems     Objective:   Physical Exam  There was tends to palpation splenius capitis and occipitalis musculature region of mild degree. There was tenderness over the cervical facet cervical paraspinal musculature region of mild to moderate degree. There was moderate tends to palpation of the acromioclavicular glenohumeral joint region with limited range of motion of the acromioclavicular glenohumeral joint region and difficulty performing the drop test. There was decreased grip strength. Tinel and Phalen's maneuver without increased pain of significant degree. There appeared to be unremarkable Spurling's maneuver. Palpation of the thoracic facet thoracic paraspinal muscular region was with evidence of significant muscle spasms in the lower thoracic paraspinal musculature. Palpation over the lumbar paraspinal muscles region lumbar facet region was increased pain with extension and palpation of the lumbar facets reproducing moderate to  moderately severe discomfort. Lateral bending and rotation extension and palpation of the lumbar facets reproduce moderate to moderately severe discomfort. Straight leg raising limited to approximately 20 without increased pain with dorsiflexion noted. There was negative clonus negative Homans. DTRs difficult to elicit patient had difficulty relaxing. There was mild tenderness of the greater trochanteric region. There was negative clonus negative Homans. Abdomen was nontender with no costovertebral angle tenderness noted.         Assessment & Plan:   degenerative disc disease lumbar spine  Progress Notes   KATRINA BROSH (MR# [id number])      Progress Notes Info    Author Note Status Last Update User Last Update Date/Time   Mohammed Kindle, MD Signed Mohammed Kindle, MD 11/15/2014 4:24 PM    Progress Notes    Expand All Collapse All     Subjective:    Patient ID: Gerald Ellis, male DOB: 01-Dec-1965, 49 y.o. MRN: [id number]  HPI  Patient is 49 year old gentleman returns [facility] for further evaluation and treatment of pain involving the neck upper extremity regions headache lower back and lower extremity pain. Patient is continuing his negotiations with insurance company regarding need for more surgery of the lumbar region. Patient continues litigation in this regard. As patient's overall condition and patient appears to be with elements of depression and we will proceed with psych evaluation and will continue present medications of hydrocodone acetaminophen and tramadol. We will avoid interventional treatment as discussed with patient. The patient was understanding and in agreement with suggested treatment plan.     Review of Systems     Objective:   Physical Exam  There was tenderness over the splenius capitis and occipitalis musculature region of mild degree no new lesions of  the head and neck were noted. There was tenderness over the cervical  facet cervical paraspinal musculature region and thoracic facet thoracic paraspinal musculature region of moderate degree. Palpation of the acromioclavicular glenohumeral joint region was a tends to palpation of moderate degree with limited range of motion of the shoulders. Patient had some difficulty performing the drop test. Palpation over the thoracic facet thoracic paraspinal musculature region was a tends to palpation of mild degree with no crepitus of the thoracic region noted. Palpation over the lumbar paraspinal muscles region lumbar facet region was severely limited with extension and palpation of the lumbar facets reproducing severe discomfort. Straight leg raising was tolerates approximately 20 without an increase of pain with dorsiflexion noted. EHL strength appeared to be decreased definite sensory deficit of dermatomal distribution was detected and there was negative clonus negative Homans. DTRs are difficult to elicit patient had developed relaxing. Mild tenderness of the greater trochanteric region iliotibial band region. Abdomen nontender and no costovertebral angle tenderness noted.      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  Degenerative joint disease of shoulder Mildly displaced SLAP tear that extends into the biceps anchor with fluid signal intensity between these's of arachnoid deltoid bursa and the glenohumeral joint between the supraspinatus and infraspinatus tendons suspicious for a posterior supraspinatus full-thickness tear, partial tear and was also insertional partial with tearing of the  supraspinatus and infraspinatus along the articular surface with moderate supraspinatus and if spinatus subscapularis tendinopathy  Bilateral greater occipital neuralgia  Depression/Anxiety      Plan    Continue present medications tramadol and hydrocodone acetaminophen  F/U PCP Dr. Kary Kos for evaliation of BP and general medical condition.  F/U surgical evaluation  F/U neurological evaluation  Psych evaluation as discussed  May consider radiofrequency rhizolysis or intraspinal procedures pending response to present treatment and F/U evaluation.  [facility] to call [facility] should patient have concerns prior to scheduled return appointment.

## 2014-12-12 ENCOUNTER — Encounter: Payer: Medicare Other | Admitting: Pain Medicine

## 2014-12-13 ENCOUNTER — Ambulatory Visit: Payer: Self-pay | Admitting: Urology

## 2014-12-20 ENCOUNTER — Other Ambulatory Visit: Payer: Self-pay | Admitting: Pain Medicine

## 2014-12-20 ENCOUNTER — Ambulatory Visit (INDEPENDENT_AMBULATORY_CARE_PROVIDER_SITE_OTHER): Payer: Medicare Other

## 2014-12-20 DIAGNOSIS — E291 Testicular hypofunction: Secondary | ICD-10-CM | POA: Diagnosis not present

## 2014-12-20 MED ORDER — TESTOSTERONE CYPIONATE 200 MG/ML IM SOLN
200.0000 mg | Freq: Once | INTRAMUSCULAR | Status: AC
  Administered 2014-12-20: 200 mg via INTRAMUSCULAR

## 2014-12-20 NOTE — Progress Notes (Signed)
Testosterone IM Injection  Due to Hypogonadism patient is present today for a Testosterone Injection.  Medication: Testosterone Cypionate Dose: 53mL Location: right upper outer buttocks Lot: [id number] Exp:11/2015  Patient tolerated well, no complications were noted  Preformed by: Toniann Fail, LPN  Follow up: 3 weeks

## 2014-12-30 ENCOUNTER — Other Ambulatory Visit: Payer: Self-pay

## 2014-12-30 DIAGNOSIS — Z7952 Long term (current) use of systemic steroids: Secondary | ICD-10-CM | POA: Diagnosis not present

## 2014-12-30 DIAGNOSIS — J209 Acute bronchitis, unspecified: Secondary | ICD-10-CM | POA: Insufficient documentation

## 2014-12-30 DIAGNOSIS — Z79899 Other long term (current) drug therapy: Secondary | ICD-10-CM | POA: Diagnosis not present

## 2014-12-30 DIAGNOSIS — Z791 Long term (current) use of non-steroidal anti-inflammatories (NSAID): Secondary | ICD-10-CM | POA: Insufficient documentation

## 2014-12-30 DIAGNOSIS — N481 Balanitis: Secondary | ICD-10-CM | POA: Diagnosis not present

## 2014-12-30 DIAGNOSIS — I10 Essential (primary) hypertension: Secondary | ICD-10-CM | POA: Diagnosis not present

## 2014-12-30 DIAGNOSIS — E1165 Type 2 diabetes mellitus with hyperglycemia: Secondary | ICD-10-CM | POA: Diagnosis not present

## 2014-12-30 DIAGNOSIS — Z794 Long term (current) use of insulin: Secondary | ICD-10-CM | POA: Insufficient documentation

## 2014-12-30 DIAGNOSIS — Z792 Long term (current) use of antibiotics: Secondary | ICD-10-CM | POA: Diagnosis not present

## 2014-12-30 DIAGNOSIS — R42 Dizziness and giddiness: Secondary | ICD-10-CM | POA: Diagnosis present

## 2014-12-30 NOTE — ED Notes (Signed)
Pt states he was treated for the flu 6 weeks ago but never got rid of the cough, states he went to the PCP at [facility] on Wednesday and place on 2 abx, cipro and amoxicillin..states being on them has had dizziness and confusion, just not felt himself with redness of the face and a tingling sensation, HA.Marland Kitchen

## 2014-12-30 NOTE — ED Notes (Signed)
Patient was seen by his doctor and labs were done there before he was sent to the emergency department.Triage  RN was able to pull lab results up, because patient requested we use the results from his doctor.

## 2014-12-31 ENCOUNTER — Emergency Department
Admission: EM | Admit: 2014-12-31 | Discharge: 2014-12-31 | Disposition: A | Discharge status: Home / Self Care | Payer: Medicare Other | Attending: Emergency Medicine | Admitting: Emergency Medicine

## 2014-12-31 DIAGNOSIS — E1165 Type 2 diabetes mellitus with hyperglycemia: Secondary | ICD-10-CM

## 2014-12-31 DIAGNOSIS — J40 Bronchitis, not specified as acute or chronic: Secondary | ICD-10-CM

## 2014-12-31 DIAGNOSIS — N481 Balanitis: Secondary | ICD-10-CM

## 2014-12-31 LAB — URINALYSIS COMPLETE WITH MICROSCOPIC (ARMC ONLY)
Bacteria, UA: NONE SEEN
Bilirubin Urine: NEGATIVE
Glucose, UA: >500 mg/dL — AB
HGB URINE DIPSTICK: NEGATIVE
KETONES UR: NEGATIVE mg/dL
LEUKOCYTES UA: NEGATIVE
NITRITE: NEGATIVE
Protein, ur: NEGATIVE mg/dL
SPECIFIC GRAVITY, URINE: 1.005 (ref 1.005–1.030)
pH: 5 (ref 5.0–8.0)

## 2014-12-31 LAB — BASIC METABOLIC PANEL
Anion gap: 10 (ref 5–15)
BUN: 6 mg/dL (ref 6–20)
CALCIUM: 8.8 mg/dL — AB (ref 8.9–10.3)
CHLORIDE: 101 mmol/L (ref 101–111)
CO2: 24 mmol/L (ref 22–32)
CREATININE: 0.7 mg/dL (ref 0.61–1.24)
GFR calc Af Amer: >60 mL/min (ref >60)
GLUCOSE: 199 mg/dL — AB (ref 65–99)
POTASSIUM: 3.2 mmol/L — AB (ref 3.5–5.1)
SODIUM: 135 mmol/L (ref 135–145)

## 2014-12-31 LAB — CBC
HCT: 45 % (ref 40.0–52.0)
Hemoglobin: 15.4 g/dL (ref 13.0–18.0)
MCH: 31.5 pg (ref 26.0–34.0)
MCHC: 34.2 g/dL (ref 32.0–36.0)
MCV: 91.9 fL (ref 80.0–100.0)
PLATELETS: 219 10*3/uL (ref 150–440)
RBC: 4.89 MIL/uL (ref 4.40–5.90)
RDW: 14.2 % (ref 11.5–14.5)
WBC: 10.2 10*3/uL (ref 3.8–10.6)

## 2014-12-31 MED ORDER — SODIUM CHLORIDE 0.9 % IV BOLUS (SEPSIS)
1000.0000 mL | Freq: Once | INTRAVENOUS | Status: AC
  Administered 2014-12-31: 1000 mL via INTRAVENOUS

## 2014-12-31 MED ORDER — FLUCONAZOLE 100 MG PO TABS
100.0000 mg | ORAL_TABLET | Freq: Once | ORAL | Status: AC
  Administered 2014-12-31: 100 mg via ORAL
  Filled 2014-12-31: qty 1

## 2014-12-31 NOTE — Discharge Instructions (Signed)
Your cough is likely due to a viral bronchitis. The redness and swelling of your penis could be from yeast and/or bacteria. Continue the antibiotic in and I used medication. Continue your current diabetes medications. Follow-up with your doctor this coming week. Return to the emergency department if you feel worse or have other urgent concerns.  Balanitis Balanitis is inflammation of the head of the penis (glans).  CAUSES  Balanitis has multiple causes, both infectious and noninfectious. Often balanitis is the result of poor personal hygiene, especially in uncircumcised males. Without adequate washing, viruses, bacteria, and yeast collect between the foreskin and the glans. This can cause an infection. Lack of air and irritation from a normal secretion called smegma contribute to the cause in uncircumcised males. Other causes include:  Chemical irritation from the use of certain soaps and shower gels (especially soaps with perfumes), condoms, personal lubricants, petroleum jelly, spermicides, and fabric conditioners.  Skin conditions, such as eczema, dermatitis, and psoriasis.  Allergies to drugs, such as tetracycline and sulfa.  Certain medical conditions, including liver cirrhosis, congestive heart failure, and kidney disease.  Morbid obesity. RISK FACTORS  Diabetes mellitus.  A tight foreskin that is difficult to pull back past the glans (phimosis).  Sex without the use of a condom. SIGNS AND SYMPTOMS  Symptoms may include:  Discharge coming from under the foreskin.  Tenderness.  Itching and inability to get an erection (because of the pain).  Redness and a rash.  Sores on the glans and on the foreskin. DIAGNOSIS Diagnosis of balanitis is confirmed through a physical exam. TREATMENT The treatment is based on the cause of the balanitis. Treatment may include:  Frequent cleansing.  Keeping the glans and foreskin dry.  Use of medicines such as creams, pain medicines,  antibiotics, or medicines to treat fungal infections.  Sitz baths. If the irritation has caused a scar on the foreskin that prevents easy retraction, a circumcision may be recommended.  HOME CARE INSTRUCTIONS  Sex should be avoided until the condition has cleared. MAKE SURE YOU:  Understand these instructions.  Will watch your condition.  Will get help right away if you are not doing well or get worse. Document Released: 09/22/2008 Document Revised: 05/11/2013 Document Reviewed: 10/26/2012 [facility] Patient Information 2015 [place], [place]. This information is not intended to replace advice given to you by your health care provider. Make sure you discuss any questions you have with your health care provider.

## 2014-12-31 NOTE — ED Provider Notes (Signed)
[facility] Emergency Department Provider Note  ____________________________________________  Time seen: 1:26 AM  I have reviewed the triage vital signs and the nursing notes.   HISTORY  Chief Complaint Dizziness and Altered Mental Status     HPI Gerald Ellis is a 49 y.o. male who has diabetes. He has had a cough and signs of bronchitis for 5-6 weeks. He reports this began when he had "the flu". He did not see a doctor when this problem first began, however he went this past Wednesday. The patient tells me how he was called today with abnormal results from his blood tests on Wednesday including concerns for an elevated white blood cell count and for elevated blood sugar level. Apparently his sugar on Wednesday was approximately 500. He was discharged from the doctor's office and went home, he mowed the yard, and he reports that over 2 hour. The glucose dropped from over 470 down to 74. He reports he was not feeling well with this sudden drop in glucose. He now reports his sugar has been running about 200-250.  He had a follow-up appointment at the [facility] office today to to reevaluate for his abnormally high white blood cell count, cough, and redness on his penis that was noted on Wednesday. He tells me at that time he was placed on amoxicillin and Cipro. Review of the records shows that he was actually prescribed Augmentin and Diflucan. The Diflucan was due to concerns for candidiasis with balanitis.  While in the office today, the patient felt uncomfortable, he became more red in the face, he felt weak and dizzy. While the chief complaint today list altered mental status as well, he denies having any altered mental status.  The patient gives a great deal of information, although some of it does not appear to be accurate. We have reviewed the records from his visit at [facility] clinic on Wednesday as well as the lab results that are available. See medical reasoning  below for further information on this.     Past Medical History  Diagnosis Date  . Hypercholesteremia   . Hypertension   . Diabetes mellitus without complication   . Hemorrhoids   . Environmental allergies   . Lumbago   . Childhood asthma   . Hypogonadism in male   . Over weight   . Benign enlargement of prostate   . Erectile dysfunction     Patient Active Problem List   Diagnosis Date Noted  . 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  . Hypogonadism in male 11/22/2014  . BPH with obstruction/lower urinary tract symptoms 11/22/2014  . DDD (degenerative disc disease), lumbar 10/13/2014  . Status post lumbar laminectomy 10/13/2014  . DJD of shoulder 10/13/2014  . Bilateral occipital neuralgia 10/13/2014  . Lumbar radiculopathy 10/13/2014  . DDD (degenerative disc disease), cervical 10/13/2014    Past Surgical History  Procedure Laterality Date  . Cholecystectomy    . Back surgery  1992    Current Outpatient Rx  Name  Route  Sig  Dispense  Refill  . amoxicillin-clavulanate (AUGMENTIN) 875-125 MG per tablet   Oral   Take 1 tablet by mouth 2 (two) times daily.         . cyclobenzaprine (FLEXERIL) 10 MG tablet   Oral   Take 10 mg by mouth at bedtime.         . DHA-EPA-VITAMIN E PO   Oral   Take 1 tablet by mouth daily.         Marland Kitchen  diclofenac sodium (VOLTAREN) 1 % GEL   Topical   Apply 2-4 g topically 4 (four) times daily.         . fluconazole (DIFLUCAN) 200 MG tablet   Oral   Take 1 tablet by mouth daily.         . fluticasone (FLONASE) 50 MCG/ACT nasal spray   Each Nare   Place 1 spray into both nostrils daily.         Marland Kitchen gabapentin (NEURONTIN) 300 MG capsule   Oral   Take 300 mg by mouth at bedtime. 2 capsules at bedtime         . glipiZIDE (GLUCOTROL) 10 MG tablet   Oral   Take 10 mg by mouth daily before breakfast.         . HYDROcodone-acetaminophen (NORCO/VICODIN) 5-325 MG per tablet      Limit 2-3 tabs per mouth per  day if tolerated   90 tablet   0   . insulin detemir (LEVEMIR) 100 UNIT/ML injection   Subcutaneous   Inject 28 Units into the skin every morning.          . insulin lispro (HUMALOG) 100 UNIT/ML injection   Subcutaneous   Inject 15 Units into the skin 3 (three) times daily with meals.          Marland Kitchen lisinopril (PRINIVIL,ZESTRIL) 20 MG tablet   Oral   Take 20 mg by mouth 2 (two) times daily.         . metFORMIN (GLUCOPHAGE) 1000 MG tablet   Oral   Take 1,000 mg by mouth 2 (two) times daily with a meal.         . Multiple Vitamins-Minerals (MULTIVITAMIN WITH MINERALS) tablet   Oral   Take 1 tablet by mouth daily.         . pioglitazone (ACTOS) 45 MG tablet   Oral   Take 45 mg by mouth daily.         . simvastatin (ZOCOR) 40 MG tablet   Oral   Take 40 mg by mouth daily.         Marland Kitchen testosterone cypionate (DEPOTESTOTERONE CYPIONATE) 200 MG/ML injection   Intramuscular   Inject 200 mg into the muscle every 21 ( twenty-one) days.         . traMADol (ULTRAM) 50 MG tablet      Limit 1 tab by mouth twice a day 2 to 3 times a day if tolerated   80 tablet   0     Allergies Ibuprofen  Family History  Problem Relation Age of Onset  . Heart disease Mother     CABG  . Cancer Mother     Disseminated  . Diabetes Father     mother  . Alzheimer's disease    . Hypertension Mother   . Hyperlipidemia Mother   . Kidney disease Neg Hx   . Prostate cancer Neg Hx     Social History Social History  Substance Use Topics  . Smoking status: Never Smoker   . Smokeless tobacco: Never Used  . Alcohol Use: 0.0 oz/week    0 Standard drinks or equivalent per week     Comment: occasionally    Review of Systems  Constitutional: Negative for fever. Uncontrolled blood sugar levels. Patient is diabetic2 ENT: Negative for sore throat. Cardiovascular: Negative for chest pain. Respiratory: positive for cough over the past 5-6 weeks. Gastrointestinal: Negative for abdominal  pain, vomiting and diarrhea. Genitourinary: redness on the  penis, diagnosed with balanitis and likely yeast infection. See history of present illness Musculoskeletal: No myalgias or injuries. Skin: Negative for rash. Neurological: Negative for headaches   10-point ROS otherwise negative.  ____________________________________________   PHYSICAL EXAM:  VITAL SIGNS: ED Triage Vitals  Enc Vitals Group     BP 12/30/14 1546 148/91 mmHg     Pulse Rate 12/30/14 1546 96     Resp 12/30/14 1546 18     Temp 12/30/14 1546 98.4 F (36.9 C)     Temp Source 12/30/14 1546 Oral     SpO2 12/30/14 1546 97 %     Weight 12/30/14 1546 238 lb (107.956 kg)     Height 12/30/14 1546 5\' 9"  (1.753 m)     Head Cir --      Peak Flow --      Pain Score --      Pain Loc --      Pain Edu? --      Excl. in [place]? --     Constitutional:  Alert and oriented. Overall well appearing and in no distress. ENT   Head: Normocephalic and atraumatic.   Nose: No congestion/rhinnorhea.   Mouth/Throat: Mucous membranes are moist. Cardiovascular: Normal rate, regular rhythm, no murmur noted Respiratory:  Normal respiratory effort, no tachypnea.    Breath sounds are clear and equal bilaterally.   O2 sat 100% on room air.  Gastrointestinal: Soft and nontender. No distention.  Genital: Uncircumcised. Mild erythema around the distal portion of the foreskin. There is some mild thickening, swelling to this area. No abnormal drainage. Back: No muscle spasm, no tenderness, no CVA tenderness. Musculoskeletal: No deformity noted. Nontender with normal range of motion in all extremities.  No noted edema. Neurologic:  Normal speech and language. No gross focal neurologic deficits are appreciated.  Skin:  Skin is warm, dry. No rash noted. Psychiatric: Mood and affect are overall normal, however the patient does provide a fair amount of extra strenuous information regarding his medical visits and medical  condition.  ____________________________________________    LABS (pertinent positives/negatives)  Labs Reviewed  BASIC METABOLIC PANEL - Abnormal; Notable for the following:    Potassium 3.2 (*)    Glucose, Bld 199 (*)    Calcium 8.8 (*)    All other components within normal limits  URINALYSIS COMPLETEWITH MICROSCOPIC ([facility] ONLY) - Abnormal; Notable for the following:    Color, Urine STRAW (*)    APPearance CLEAR (*)    Glucose, UA >500 (*)    Squamous Epithelial / LPF 0-5 (*)    All other components within normal limits  CBC     ____________________________________________   EKG  ED ECG REPORT I, Esthela Brandner W, the attending physician, personally viewed and interpreted this ECG.   Date: 12/31/2014  EKG Time: 2011  Rate: 88  Rhythm: Normal sinus rhythm  Axis: Normal  Intervals: Normal  ST&T Change: None noted  _____________________________________________   INITIAL IMPRESSION / ASSESSMENT AND PLAN / ED COURSE  Pertinent labs & imaging results that were available during my care of the patient were reviewed by me and considered in my medical decision making (see chart for details).  Overall well-appearing 49 year old male with diabetes and most likely bronchitis over the past 5-6 weeks. He also has balanitis which may be due to bacterial or yeast infection. He is already on Augmentin and Diflucan.   History of sudden labile lately. If they continue to be in the 200s, I am comfortable with that. We  will treat him with 1 L of normal saline.  We have reviewed the results from his visit at [facility] clinic on Wednesday. His white blood cell Was 10,000, which is overall normal. His blood sugar that time was elevated. His CO2 was normal.  ----------------------------------------- 4:17 AM on 12/31/2014 -----------------------------------------  The patient's white blood cell count is the same as it was 2 days ago. His hemoglobin level is good. He has a slightly low  potassium level at 3.2. Glucose of 199. Urine shows no sign of infection. He does have glucose greater than 500 in the urine.  The patient had received 1 L of normal saline IV. We have discussed his symptoms and the likelihood that his cough is due to a viral bronchitis.   I am more concerned for a bacterial infection with his balanitis. This inflammation and erythema may be due to yeast or bacteria. I would advise that he continue medicines for both.  Patient will continue with his current diabetes regimen and follow-up with his regular doctor, Dr. Kary Kos, this coming week.   ____________________________________________   FINAL CLINICAL IMPRESSION(S) / ED DIAGNOSES  Final diagnoses:  Bronchitis  Balanitis  Hyperglycemia due to type 2 diabetes mellitus      Ahmed Prima, MD 12/31/14 0422

## 2015-01-10 ENCOUNTER — Ambulatory Visit: Payer: Medicare Other

## 2015-01-10 ENCOUNTER — Encounter: Payer: Self-pay | Admitting: Pain Medicine

## 2015-01-10 ENCOUNTER — Ambulatory Visit: Payer: Medicare Other | Attending: Pain Medicine | Admitting: Pain Medicine

## 2015-01-10 ENCOUNTER — Ambulatory Visit (INDEPENDENT_AMBULATORY_CARE_PROVIDER_SITE_OTHER): Payer: Medicare Other

## 2015-01-10 VITALS — BP 121/84 | HR 88 | Temp 98.1°F | Resp 16 | Ht 70.0 in | Wt 255.0 lb

## 2015-01-10 DIAGNOSIS — M19019 Primary osteoarthritis, unspecified shoulder: Secondary | ICD-10-CM | POA: Diagnosis not present

## 2015-01-10 DIAGNOSIS — J40 Bronchitis, not specified as acute or chronic: Secondary | ICD-10-CM | POA: Insufficient documentation

## 2015-01-10 DIAGNOSIS — E291 Testicular hypofunction: Secondary | ICD-10-CM | POA: Diagnosis not present

## 2015-01-10 DIAGNOSIS — M4806 Spinal stenosis, lumbar region: Secondary | ICD-10-CM | POA: Diagnosis not present

## 2015-01-10 DIAGNOSIS — M5481 Occipital neuralgia: Secondary | ICD-10-CM | POA: Diagnosis not present

## 2015-01-10 DIAGNOSIS — M5136 Other intervertebral disc degeneration, lumbar region: Secondary | ICD-10-CM

## 2015-01-10 DIAGNOSIS — M5416 Radiculopathy, lumbar region: Secondary | ICD-10-CM

## 2015-01-10 DIAGNOSIS — M19011 Primary osteoarthritis, right shoulder: Secondary | ICD-10-CM

## 2015-01-10 DIAGNOSIS — M5126 Other intervertebral disc displacement, lumbar region: Secondary | ICD-10-CM | POA: Insufficient documentation

## 2015-01-10 DIAGNOSIS — M5030 Other cervical disc degeneration, unspecified cervical region: Secondary | ICD-10-CM

## 2015-01-10 DIAGNOSIS — Z9889 Other specified postprocedural states: Secondary | ICD-10-CM

## 2015-01-10 DIAGNOSIS — M25512 Pain in left shoulder: Secondary | ICD-10-CM | POA: Diagnosis present

## 2015-01-10 DIAGNOSIS — M19012 Primary osteoarthritis, left shoulder: Secondary | ICD-10-CM

## 2015-01-10 DIAGNOSIS — M25511 Pain in right shoulder: Secondary | ICD-10-CM | POA: Diagnosis present

## 2015-01-10 MED ORDER — TRAMADOL HCL 50 MG PO TABS: ORAL_TABLET | ORAL | Status: DC

## 2015-01-10 MED ORDER — TESTOSTERONE CYPIONATE 200 MG/ML IM SOLN
200.0000 mg | Freq: Once | INTRAMUSCULAR | Status: AC
  Administered 2015-01-10: 200 mg via INTRAMUSCULAR

## 2015-01-10 MED ORDER — HYDROCODONE-ACETAMINOPHEN 5-325 MG PO TABS: ORAL_TABLET | ORAL | Status: DC

## 2015-01-10 NOTE — Progress Notes (Signed)
Testosterone IM Injection  Due to Hypogonadism patient is present today for a Testosterone Injection.  Medication: Testosterone Cypionate Dose: 5mL Location: right upper outer buttocks Lot: [id number] Exp:01/2016  Patient tolerated well, no complications were noted  Preformed by: Toniann Fail, LPN  Follow up: 3 weeks

## 2015-01-10 NOTE — Patient Instructions (Addendum)
Continue present medicationsTramadol and hydrocodone acetaminophen  F/U PCP Dr. Kary Kos for evaliation of bronchitis, BP, and general medical  Condition.  Ask Dr. Kary Kos if you need to continue your antibiotic and if you need a chest x-ray or other studies to further evaluate your  condition  F/U surgical evaluation  F/U neurological evaluation  Psych evaluation as discussed  May consider radiofrequency rhizolysis or intraspinal procedures pending response to present treatment and F/U evaluation   Patient to call [facility] should patient have concerns prior to scheduled return appointmen.

## 2015-01-10 NOTE — Progress Notes (Signed)
Safety precautions to be maintained throughout the outpatient stay will include: orient to surroundings, keep bed in low position, maintain call bell within reach at all times, provide assistance with transfer out of bed and ambulation.  

## 2015-01-10 NOTE — Progress Notes (Signed)
Subjective:    Patient ID: Gerald Ellis, male    DOB: 08/15/1965, 49 y.o.   MRN: [id number]  HPI Patient is 49 year old gentleman [facility] and treatment of pain involving the shoulders upper back mid back lower back lower extremity region. Patient recently diagnosed with bronchitis. Patient is presently taking medications for treatment of his pulmonary condition. We will advise patient follow-up Dr. Kary Kos for further evaluation of his pulmonary condition and general medical condition at this time. We also reviewed patient's urine drug screen which was with some inconsistencies. We will have patient undergoes psych evaluation for treatment due to the inconsistent urine drug screen and due to some elements of depression. We will also reduce patient's medications on today's visit. We reduced the tramadol as well as her hydrocodone acetaminophen due to negative urine drug screen for these medications at time of previous appointment. The patient was understanding and states that he would comply and proceed with psych evaluation and follow-up with Dr. Kary Kos as discussed     Review of Systems     Objective:   Physical Exam  There was moderate tenderness to palpation of the splenius capitis and occipitalis musculature regions. There Was Moderate Tenderness of the Acromioclavicular Glenohumeral Joint Region. Patient with Mild Difficulty Performing the Drop Test. There Appeared to Be Unremarkable Spurling's Maneuver.. Tinel and Phalen's maneuver were without increased pain of significant degree. Palpation over the region of the thoracic facet thoracic paraspinal musculature region was with moderate muscle spasms of the lower thoracic region. Palpation over the lumbar paraspinal muscle lumbar facet region reproduced moderate to moderately severe discomfort. Lateral bending and rotation and extension to palpation of the lumbar facets reproduce moderately  severe discomfort. There was mild tenderness of the greater trochanteric region and iliotibial band region. DTRs difficult to elicit patient had difficulty relaxing straight leg raising was tolerates approximately 20 with what appeared to be questioned will increase of pain with dorsiflexion noted. There was negative clonus negative Homans. Mild tinnitus of the greater trochanteric region and iliotibial band region. Abdomen was nontender and no costovertebral angle tenderness was noted.     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  Degenerative joint disease of shoulder Mildly displaced SLAP tear that extends into the biceps anchor with fluid signal intensity between these's of arachnoid deltoid bursa and the glenohumeral joint between the supraspinatus and infraspinatus tendons suspicious for a posterior supraspinatus full-thickness tear, partial tear and was also insertional partial with tearing of the supraspinatus and infraspinatus along the articular surface with moderate supraspinatus and if spinatus subscapularis tendinopathy  Bilateral greater occipital neuralgia  Bronchitis   Plan   Continue present medication Ultram and hydrocodone acetaminophen. We reduced the quantities of Ultram and hydrocodone acetaminophen due to inconsistent urine drug screen results  F/U PCPDr. Kary Kos for evaliation of bronchitis BP and general medical  condition as discussed  F/U surgical evaluation as discussed  F/U neurological evaluation  Psych evaluation was scheduled. Patient with inconsistent urine drug screen and  with some elements of depression/anxiety patient will proceed with psych evaluation as discussed . Patient was in agreement with suggested psych evaluation  May consider radiofrequency rhizolysis or intraspinal procedures pending response to present treatment and F/U evaluation   [facility] should patient have concerns prior to scheduled return appointmen.

## 2015-01-10 NOTE — Progress Notes (Signed)
Dr Crisp notified of UDS report on chart 

## 2015-01-19 ENCOUNTER — Other Ambulatory Visit: Payer: Self-pay | Admitting: Pain Medicine

## 2015-01-31 ENCOUNTER — Ambulatory Visit (INDEPENDENT_AMBULATORY_CARE_PROVIDER_SITE_OTHER): Payer: Medicare Other

## 2015-01-31 DIAGNOSIS — E291 Testicular hypofunction: Secondary | ICD-10-CM

## 2015-01-31 MED ORDER — TESTOSTERONE CYPIONATE 200 MG/ML IM SOLN
200.0000 mg | Freq: Once | INTRAMUSCULAR | Status: AC
  Administered 2015-01-31: 200 mg via INTRAMUSCULAR

## 2015-01-31 NOTE — Progress Notes (Signed)
Testosterone IM Injection  Due to Hypogonadism patient is present today for a Testosterone Injection.  Medication: Testosterone Cypionate Dose: 49mL Location: right upper outer buttocks Lot: [id number] Exp:11/2015  Patient tolerated well, no complications were noted  Preformed by: Toniann Fail, LPN

## 2015-02-09 ENCOUNTER — Encounter: Payer: Self-pay | Admitting: Pain Medicine

## 2015-02-09 ENCOUNTER — Ambulatory Visit: Payer: Medicare Other | Attending: Pain Medicine | Admitting: Pain Medicine

## 2015-02-09 VITALS — BP 140/89 | HR 105 | Temp 97.1°F | Resp 18 | Ht 70.0 in | Wt 255.0 lb

## 2015-02-09 DIAGNOSIS — M4806 Spinal stenosis, lumbar region: Secondary | ICD-10-CM | POA: Insufficient documentation

## 2015-02-09 DIAGNOSIS — M5126 Other intervertebral disc displacement, lumbar region: Secondary | ICD-10-CM | POA: Insufficient documentation

## 2015-02-09 DIAGNOSIS — M25511 Pain in right shoulder: Secondary | ICD-10-CM | POA: Diagnosis present

## 2015-02-09 DIAGNOSIS — M5481 Occipital neuralgia: Secondary | ICD-10-CM

## 2015-02-09 DIAGNOSIS — M5030 Other cervical disc degeneration, unspecified cervical region: Secondary | ICD-10-CM

## 2015-02-09 DIAGNOSIS — Z9889 Other specified postprocedural states: Secondary | ICD-10-CM

## 2015-02-09 DIAGNOSIS — M25512 Pain in left shoulder: Secondary | ICD-10-CM | POA: Diagnosis present

## 2015-02-09 DIAGNOSIS — M19019 Primary osteoarthritis, unspecified shoulder: Secondary | ICD-10-CM | POA: Insufficient documentation

## 2015-02-09 DIAGNOSIS — M19012 Primary osteoarthritis, left shoulder: Secondary | ICD-10-CM

## 2015-02-09 DIAGNOSIS — M5136 Other intervertebral disc degeneration, lumbar region: Secondary | ICD-10-CM

## 2015-02-09 DIAGNOSIS — M5416 Radiculopathy, lumbar region: Secondary | ICD-10-CM

## 2015-02-09 DIAGNOSIS — M19011 Primary osteoarthritis, right shoulder: Secondary | ICD-10-CM

## 2015-02-09 MED ORDER — HYDROCODONE-ACETAMINOPHEN 5-325 MG PO TABS: ORAL_TABLET | ORAL | Status: DC

## 2015-02-09 MED ORDER — TRAMADOL HCL 50 MG PO TABS: ORAL_TABLET | ORAL | Status: DC

## 2015-02-09 NOTE — Patient Instructions (Addendum)
PLAN   Continue present medication tramadol and hydrocodone acetaminophen  F/U PCP for evaliation of  BP Dr. Kary Kos and general medical  condition  F/U surgical evaluation. May consider pending follow-up evaluations  F/U neurological evaluation. May consider pending follow-up evaluations  Psych evaluation as discussed Ask nurses and Caryl Pina the date of your psych appointment and please keep psych appointment as we discussed  May consider radiofrequency rhizolysis or intraspinal procedures pending response to present treatment and F/U evaluation   [facility] to call [facility] should patient have concerns prior to scheduled return appointment.

## 2015-02-09 NOTE — Progress Notes (Signed)
Safety precautions to be maintained throughout the outpatient stay will include: orient to surroundings, keep bed in low position, maintain call bell within reach at all times, provide assistance with transfer out of bed and ambulation.  

## 2015-02-09 NOTE — Progress Notes (Signed)
Subjective:    Patient ID: Gerald Ellis, male    DOB: [birth year], 49 y.o.   MRN: [id number]  HPI Patient is 49 year old gentleman [facility] further evaluation and treatment of pain involving the shoulders headache upper mid and lower back lower extremity region with predominant pain involving the lower back and lower extremity region. Patient is status post prior surgical intervention of the lumbar region. Patient has been involved in discussions regarding need for additional surgical intervention of the lumbar region versus no additional surgical intervention of the lumbar region. We discussed patient's condition on today's visit we also addressed patient's urine drug screen and inconsistency which is been on the urine drug screen. We informed patient that we wish to have patient undergoes psych evaluation to address the urine drug screen at inconsistencies and to address patient's anxiety and depression as well. The patient was in agreement with suggested treatment plan. We will continue medications as discussed on today's visit with the patient. The patient was in agreement with suggested treatment plan.     Review of Systems     Objective:   Physical Exam There was tenderness of the splenius capitis and occipitalis musculature region of moderate degree. There was moderate tends of the trapezius levator scapula and rhomboid musculature region. No masses of the head and neck were noted. There was limited range of motion of the shoulders. Patient had difficulty performing the drop test. Patient appeared to be with slightly decreased grip strength. Tinel and Phalen's maneuver were without increase of pain of significant degree. There was tends to palpation over the region of the thoracic facet thoracic paraspinal musculature region as well as the cervical facet cervical paraspinal musculature region with no crepitus of the thoracic region noted. Palpation over the  region of the lumbar paraspinal muscles region lumbar facet region was associated take to palpation of moderately severe degree. There was severely limited range of motion of the lower extremities. Straight leg raising limited to approximately 20 with no increased pain with dorsiflexion noted. DTRs are difficult to elicit patient had difficulty relaxing. There was tenderness of the greater trochanteric region iliotibial band region a mild degree. There was moderate tends to palpation of the PSIS PII S region as well as the gluteal and piriformis musculature region. There was question decreased sensation along the L5 dermatomal distribution. There was negative clonus negative Homans. Abdomen nontender with no costovertebral angle tenderness noted.     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  Degenerative joint disease of shoulder Mildly displaced SLAP tear that extends into the biceps anchor with fluid signal intensity between these's of arachnoid deltoid bursa and the glenohumeral joint between the supraspinatus and infraspinatus tendons suspicious for a posterior supraspinatus full-thickness tear, partial tear and was also insertional partial with tearing of the supraspinatus and infraspinatus along the articular surface with moderate supraspinatus and if spinatus subscapularis tendinopathy  Bilateral greater occipital neuralgia   PLAN   Continue present medication Tramadol and hydrocodone acetaminophen  F/U PCP Dr. Kary Kos for evaliation of  BP and general medical  condition  F/U surgical evaluation. Patient  undergo further evaluation as discussed and  as planned. There is been concern regarding the need for additional surgery of the lumbar region versus no need for additional surgery of the lumbar region  F/U neurological evaluation. May consider pending follow-up evaluations  Psych evaluation. We discussed patient's condition and schedule psych evaluation to address patient's urine drug screen and consistent since as well as patient's anxiety and depression. Patient aware of need to proceed with psych evaluation and expressed willingness to go to psych evaluation as scheduled  May consider radiofrequency rhizolysis or intraspinal procedures pending response to present treatment and F/U evaluation   [facility] to [facility] should patient have concerns prior to scheduled return appointment.

## 2015-02-21 ENCOUNTER — Ambulatory Visit: Payer: Medicare Other

## 2015-02-21 ENCOUNTER — Ambulatory Visit (INDEPENDENT_AMBULATORY_CARE_PROVIDER_SITE_OTHER): Payer: Medicare Other | Admitting: *Deleted

## 2015-02-21 DIAGNOSIS — E291 Testicular hypofunction: Secondary | ICD-10-CM | POA: Diagnosis not present

## 2015-02-21 MED ORDER — TESTOSTERONE CYPIONATE 200 MG/ML IM SOLN
200.0000 mg | Freq: Once | INTRAMUSCULAR | Status: AC
  Administered 2015-02-21: 200 mg via INTRAMUSCULAR

## 2015-02-21 NOTE — Progress Notes (Signed)
Testosterone IM Injection  Due to Hypogonadism patient is present today for a Testosterone Injection.  Medication: Testosterone Cypionate Dose: 1mL Location: right upper outer buttocks Lot: [id number] Exp:11/2015  Patient tolerated well, no complications were noted  Preformed by: Golden Hurter, CMA

## 2015-02-22 ENCOUNTER — Telehealth: Payer: Self-pay | Admitting: *Deleted

## 2015-02-22 LAB — TESTOSTERONE: TESTOSTERONE: 140 ng/dL — AB (ref 348–1197)

## 2015-02-22 NOTE — Telephone Encounter (Signed)
Spoke with patient and gave new results and new injection schedule. Patient ok with repeat labs in one month before 9. Patient was forwarded up front to change appointments.

## 2015-02-22 NOTE — Telephone Encounter (Signed)
-----   Message from Nori Riis, PA-C sent at 02/22/2015  9:10 AM EDT ----- Patient's testosterone level was low. I would suggest increasing his injection frequency from every 3 weeks to every 2 weeks. His new dosing will be testosterone cypionate 400 mg IM every 2 weeks.  Then recheck a morning testosterone before 9 AM in 1 month.

## 2015-02-23 ENCOUNTER — Other Ambulatory Visit: Payer: Self-pay

## 2015-03-07 ENCOUNTER — Encounter: Payer: Self-pay | Admitting: Pain Medicine

## 2015-03-07 ENCOUNTER — Ambulatory Visit: Payer: Medicare Other | Attending: Pain Medicine | Admitting: Pain Medicine

## 2015-03-07 ENCOUNTER — Ambulatory Visit (INDEPENDENT_AMBULATORY_CARE_PROVIDER_SITE_OTHER): Payer: Medicare Other

## 2015-03-07 VITALS — BP 166/91 | HR 108 | Temp 98.1°F | Resp 18

## 2015-03-07 DIAGNOSIS — M19019 Primary osteoarthritis, unspecified shoulder: Secondary | ICD-10-CM | POA: Diagnosis not present

## 2015-03-07 DIAGNOSIS — M5136 Other intervertebral disc degeneration, lumbar region: Secondary | ICD-10-CM | POA: Diagnosis not present

## 2015-03-07 DIAGNOSIS — R51 Headache: Secondary | ICD-10-CM | POA: Diagnosis present

## 2015-03-07 DIAGNOSIS — M4806 Spinal stenosis, lumbar region: Secondary | ICD-10-CM | POA: Insufficient documentation

## 2015-03-07 DIAGNOSIS — M19012 Primary osteoarthritis, left shoulder: Secondary | ICD-10-CM

## 2015-03-07 DIAGNOSIS — E291 Testicular hypofunction: Secondary | ICD-10-CM | POA: Diagnosis not present

## 2015-03-07 DIAGNOSIS — M542 Cervicalgia: Secondary | ICD-10-CM | POA: Diagnosis present

## 2015-03-07 DIAGNOSIS — M5481 Occipital neuralgia: Secondary | ICD-10-CM | POA: Diagnosis not present

## 2015-03-07 DIAGNOSIS — M5126 Other intervertebral disc displacement, lumbar region: Secondary | ICD-10-CM | POA: Insufficient documentation

## 2015-03-07 DIAGNOSIS — Z9889 Other specified postprocedural states: Secondary | ICD-10-CM

## 2015-03-07 DIAGNOSIS — M5416 Radiculopathy, lumbar region: Secondary | ICD-10-CM

## 2015-03-07 DIAGNOSIS — M19011 Primary osteoarthritis, right shoulder: Secondary | ICD-10-CM

## 2015-03-07 DIAGNOSIS — M5030 Other cervical disc degeneration, unspecified cervical region: Secondary | ICD-10-CM

## 2015-03-07 MED ORDER — TESTOSTERONE CYPIONATE 200 MG/ML IM SOLN
200.0000 mg | Freq: Once | INTRAMUSCULAR | Status: AC
  Administered 2015-03-07: 200 mg via INTRAMUSCULAR

## 2015-03-07 MED ORDER — TRAMADOL HCL 50 MG PO TABS: ORAL_TABLET | ORAL | Status: DC

## 2015-03-07 MED ORDER — HYDROCODONE-ACETAMINOPHEN 5-325 MG PO TABS: ORAL_TABLET | ORAL | Status: DC

## 2015-03-07 NOTE — Progress Notes (Signed)
Testosterone IM Injection  Due to Hypogonadism patient is present today for a Testosterone Injection.  Medication: Testosterone Cypionate Dose: 71mL Location: right upper outer buttocks Lot: [id number] Exp:11/2015  Patient tolerated well, no complications were noted  Preformed by: Toniann Fail, LPN

## 2015-03-07 NOTE — Progress Notes (Signed)
Subjective:    Patient ID: Gerald Ellis, male    DOB: 11/17/1965, 49 y.o.   MRN: [id number]  HPI Patient is 49 year old gentleman [facility] evaluation and treatment of pain involving neck shoulders headaches as well as lower back and lower extremity region. The patient is a significant pain of the lower back lower extremity region and continues to have pain involving the shoulders with pain radiating from the neck to the back of the hip precipitating headaches. We discussed patient's condition. We'll also discussed patient undergoing psych evaluation. We will have patient proceed with psych evaluation and will attempt to confirm psych evaluation prior to discharge to patient on today's visit. We discussed urine drug screen and consistent since and would like for patient undergoes psych evaluation as discussed for further assessment of patient's condition. The patient was understanding and in agreement status treatment plan. We will continue present medications at the present doses as explained to patient on today's visit Benjamine Mola was with understanding and agreement status treatment plan. The patient will proceed with psych evaluation as discussed and will remain available to consider additional modification of treatment pending follow-up evaluation. The patient also is being considered for additional surgical evaluation and we'll further discuss this with his physician and his legal counsel. The patient was with understanding and in agreement status treatment plan.       Review of Systems     Objective:   Physical Exam  There was tenderness of the splenius And occipitalis region of moderate degree. There was moderate tenderness of the cervical facet cervical paraspinal musculature region on the left as well as on the right. Palpation of the splenius capitis and occipitalis region reproduced moderate discomfort. There was tends to palpation of the  acromial clavicular and glenohumeral joint region a moderate degree. Patient was with difficulty performing the drop test. Tinel and Phalen's maneuver associated with increased pain of mild degree with minimal decreased grip strength. There was tends to palpation of the region of the thoracic facet thoracic paraspinal musculature region without crepitus of the thoracic region noted. Significant muscle spasms were noted and thoracic paraspinal musculature region. Palpation of the lumbar paraspinal muscle lumbar facet region was a tends to palpation with lateral bending and rotation status palpation of the lumbar facets reproducing moderately severe discomfort. Straight leg raising tolerated to 20 with increased pain with dorsiflexion noted. Was negative Homans. DTRs difficult tohave difficulty relaxing. Lateral bending and rotation and extension palpation lumbar facets reproduce moderate to moderately severe discomfort. There was moderate tends to palpation of the PSIS and PII S region. There was negative clonus negative Homans. No definite sensory deficit of dermatomal distribution of the lower extremities were noted. Abdomen was nontender with no costovertebral maintenance noted.      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  Degenerative joint disease of shoulder Mildly displaced SLAP tear that extends into the biceps anchor with fluid signal intensity between these's of arachnoid deltoid bursa and the glenohumeral joint between the supraspinatus and infraspinatus tendons suspicious for a posterior supraspinatus  full-thickness tear, partial tear and was also insertional partial with tearing of the supraspinatus  and infraspinatus along the articular surface with moderate supraspinatus and if spinatus subscapularis tendinopathy  Bilateral greater occipital neuralgia     PLAN   Continue present medication tramadol and hydrocodone acetaminophen at present doses as discussed. Patient will undergo psych evaluation and we will consider modifying medications pending results of psych evaluation. Patient with inconsistent urine drug screen  F/U PCP  Dr. Bertrum Sol for evaliation of  BP and general medical  condition  F/U surgical evaluation as discussed  F/U neurological evaluation. May consider pending follow-up evaluations  Psych evaluation as discussed and as planned. Patient with elements of depression and with inconsistent urine drug screen 5  May consider radiofrequency rhizolysis or intraspinal procedures pending response to present treatment and F/U evaluation   [facility] should patient have concerns prior to scheduled return appointment.

## 2015-03-07 NOTE — Patient Instructions (Addendum)
PLAN   Continue present medication tramadol and hydrocodone acetaminophen  F/U PCP Dr. Dudley Major for evaliation of  BP and general medical  condition  F/U surgical evaluation. as discussed  Please ask the staff the date of your psych evaluation prior to leaving today  F/U neurological evaluation. May consider pending follow-up evaluations  May consider radiofrequency rhizolysis or intraspinal procedures pending response to present treatment and F/U evaluation   [facility] to call [facility] should patient have concerns prior to scheduled return appointment.

## 2015-03-07 NOTE — Progress Notes (Signed)
Safety precautions to be maintained throughout the outpatient stay will include: orient to surroundings, keep bed in low position, maintain call bell within reach at all times, provide assistance with transfer out of bed and ambulation.  

## 2015-03-14 ENCOUNTER — Ambulatory Visit: Payer: Medicare Other

## 2015-03-21 ENCOUNTER — Ambulatory Visit (INDEPENDENT_AMBULATORY_CARE_PROVIDER_SITE_OTHER): Payer: Medicare Other

## 2015-03-21 ENCOUNTER — Other Ambulatory Visit: Payer: Medicare Other

## 2015-03-21 DIAGNOSIS — E291 Testicular hypofunction: Secondary | ICD-10-CM | POA: Diagnosis not present

## 2015-03-21 MED ORDER — TESTOSTERONE CYPIONATE 200 MG/ML IM SOLN
200.0000 mg | Freq: Once | INTRAMUSCULAR | Status: AC
  Administered 2015-03-21: 200 mg via INTRAMUSCULAR

## 2015-03-21 NOTE — Progress Notes (Signed)
Testosterone IM Injection  Due to Hypogonadism patient is present today for a Testosterone Injection.  Medication: Testosterone Cypionate Dose: 23mL Location: right upper outer buttocks Lot: [id number] Exp:01/2016  Patient tolerated well, no complications were noted  Preformed by: Toniann Fail, LPN  Follow up: Pt had labs drawn this morning.

## 2015-03-22 ENCOUNTER — Telehealth: Payer: Self-pay

## 2015-03-22 LAB — TESTOSTERONE: Testosterone: 247 ng/dL — ABNORMAL LOW (ref 348–1197)

## 2015-03-22 NOTE — Telephone Encounter (Signed)
LMOM

## 2015-03-22 NOTE — Telephone Encounter (Signed)
-----   Message from Nori Riis, PA-C sent at 03/22/2015  8:25 AM EDT ----- Testosterone is low, but this is the bane of the injections.  The peaks and troughs are so variable.  I am more concerned if the testosterone is elevated.  Keep present dose.

## 2015-03-23 NOTE — Telephone Encounter (Signed)
Spoke with pt in reference to testosterone results. Pt voiced understanding.

## 2015-04-04 ENCOUNTER — Ambulatory Visit (INDEPENDENT_AMBULATORY_CARE_PROVIDER_SITE_OTHER): Payer: Medicare Other

## 2015-04-04 DIAGNOSIS — E291 Testicular hypofunction: Secondary | ICD-10-CM

## 2015-04-04 MED ORDER — TESTOSTERONE CYPIONATE 200 MG/ML IM SOLN
200.0000 mg | Freq: Once | INTRAMUSCULAR | Status: AC
  Administered 2015-04-04: 200 mg via INTRAMUSCULAR

## 2015-04-04 NOTE — Progress Notes (Signed)
Testosterone IM Injection  Due to Hypogonadism patient is present today for a Testosterone Injection.  Medication: Testosterone Cypionate Dose: 76mL Location: right upper outer buttocks Lot: [id number] Exp:01/2016  Patient tolerated well, no complications were noted  Preformed by: Lyndel Pleasure, CMA

## 2015-04-06 ENCOUNTER — Ambulatory Visit: Payer: Medicare Other | Attending: Pain Medicine | Admitting: Pain Medicine

## 2015-04-06 ENCOUNTER — Encounter: Payer: Self-pay | Admitting: Pain Medicine

## 2015-04-06 VITALS — BP 145/94 | HR 101 | Temp 98.1°F | Resp 18 | Ht 70.0 in | Wt 255.0 lb

## 2015-04-06 DIAGNOSIS — J309 Allergic rhinitis, unspecified: Secondary | ICD-10-CM | POA: Diagnosis not present

## 2015-04-06 DIAGNOSIS — M19012 Primary osteoarthritis, left shoulder: Secondary | ICD-10-CM

## 2015-04-06 DIAGNOSIS — F419 Anxiety disorder, unspecified: Secondary | ICD-10-CM | POA: Insufficient documentation

## 2015-04-06 DIAGNOSIS — M5126 Other intervertebral disc displacement, lumbar region: Secondary | ICD-10-CM | POA: Insufficient documentation

## 2015-04-06 DIAGNOSIS — M5481 Occipital neuralgia: Secondary | ICD-10-CM | POA: Diagnosis not present

## 2015-04-06 DIAGNOSIS — F329 Major depressive disorder, single episode, unspecified: Secondary | ICD-10-CM | POA: Diagnosis not present

## 2015-04-06 DIAGNOSIS — M19019 Primary osteoarthritis, unspecified shoulder: Secondary | ICD-10-CM | POA: Diagnosis not present

## 2015-04-06 DIAGNOSIS — M5136 Other intervertebral disc degeneration, lumbar region: Secondary | ICD-10-CM

## 2015-04-06 DIAGNOSIS — M4806 Spinal stenosis, lumbar region: Secondary | ICD-10-CM | POA: Diagnosis not present

## 2015-04-06 DIAGNOSIS — M79604 Pain in right leg: Secondary | ICD-10-CM | POA: Diagnosis present

## 2015-04-06 DIAGNOSIS — M5416 Radiculopathy, lumbar region: Secondary | ICD-10-CM

## 2015-04-06 DIAGNOSIS — M5030 Other cervical disc degeneration, unspecified cervical region: Secondary | ICD-10-CM

## 2015-04-06 DIAGNOSIS — M79605 Pain in left leg: Secondary | ICD-10-CM | POA: Diagnosis present

## 2015-04-06 DIAGNOSIS — M19011 Primary osteoarthritis, right shoulder: Secondary | ICD-10-CM

## 2015-04-06 DIAGNOSIS — Z9889 Other specified postprocedural states: Secondary | ICD-10-CM

## 2015-04-06 DIAGNOSIS — M545 Low back pain: Secondary | ICD-10-CM | POA: Diagnosis present

## 2015-04-06 MED ORDER — TRAMADOL HCL 50 MG PO TABS: ORAL_TABLET | ORAL | Status: DC

## 2015-04-06 MED ORDER — HYDROCODONE-ACETAMINOPHEN 5-325 MG PO TABS: ORAL_TABLET | ORAL | Status: DC

## 2015-04-06 NOTE — Patient Instructions (Addendum)
PLAN   Continue present medication tramadol and hydrocodone acetaminophen  F/U PCP Dr. Dudley Major for evaliation of  BP and general medical  condition  F/U surgical evaluation. as discussed  Psych evaluation as discussed  F/U neurological evaluation. May consider pending follow-up evaluations  May consider radiofrequency rhizolysis or intraspinal procedures pending response to present treatment and F/U evaluation   [facility] to call [facility] should patient have concerns prior to scheduled return appointment.

## 2015-04-06 NOTE — Progress Notes (Signed)
Subjective:    Patient ID: Gerald Ellis, male    DOB: May 10, 1966, 49 y.o.   MRN: [id number]  HPI  The patient is a 49 year old gentleman who [facility] for further evaluation and treatment of pain involving the lower back and lower extremity regions as well as the shoulder and headaches. The patient is pain which is fairly well-controlled at this time. We discussed patient's overall condition and of the present time we will have patient undergo psych evaluation as discussed for treatment of elements of depression and anxiety. We will remain available to consider modifications of treatment pending follow-up evaluation. The patient has concerns regarding his house as well as education of his daughter as well as other concerns including his lower back and lower extremity conditions. We will continue medication as discussed and remain available to moderate plaque treatment treatment regimen pending follow-up evaluation. Patient will also undergo further evaluation of his general medical condition as well as evaluation of his sinus condition and allergy condition. All are understanding and agreement with suggested treatment plan.     Review of Systems     Objective:   Physical Exam There was tenderness to palpation over the region of the splenius capitis and a separate talus regions of mild degree. There was mild tenderness of the region of the cervical facet cervical paraspinal musculature and thoracic facet and thoracic paraspinal musculature region. Palpation over the region of the acromioclavicular and glenohumeral joint regions reproduces moderate discomfort with limited range of motion of the acromioclavicular and glenohumeral joint region and with difficulty performing drop test. Patient appeared to be without increased pain of significant degree with Tinel and Phalen's maneuver and appeared to be unremarkable Spurling's maneuver. Palpation over the thoracic facet  thoracic paraspinal muscles weren't associated with tenderness to palpation of mild to moderate degree with evidence of muscle spasms occurring in the thoracic region a moderate degree. Palpation over the lumbar paraspinal muscles lumbar facet region associated with tends to palpation of moderate to moderately severe degree with significant muscle spasms of the lumbar region. Palpation over the PSIS and PII S regions reproduce severe discomfort with straight leg raising limited to 20 without increased pain with dorsiflexion noted. There was no definite sensory deficit or dermatomal distribution detected. 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  Degenerative joint disease of shoulder Mildly displaced SLAP tear that extends into the biceps anchor with fluid signal intensity between these's of arachnoid deltoid bursa and the glenohumeral joint between the supraspinatus and infraspinatus tendons suspicious for a posterior supraspinatus full-thickness tear, partial tear and was also insertional partial with tearing of the supraspinatus and infraspinatus along the articular surface with moderate supraspinatus and if spinatus subscapularis tendinopathy  Bilateral greater occipital neuralgia  Depression/anxiety  Sinusitis/allergic rhinitis   PLAN  Continue present medication tramadol and hydrocodone acetaminophen  F/U PCP Dr. Dudley Major for evaliation of  BP and general medical  condition  F/U surgical  evaluation. as discussed  Psych evaluation as discussed  F/U neurological evaluation. May consider pending follow-up evaluations  May consider radiofrequency rhizolysis or intraspinal procedures pending response to present treatment and F/U evaluation   [facility] to call [facility] should patient have concerns prior to scheduled return appointment.

## 2015-04-06 NOTE — Progress Notes (Signed)
Safety precautions to be maintained throughout the outpatient stay will include: orient to surroundings, keep bed in low position, maintain call bell within reach at all times, provide assistance with transfer out of bed and ambulation.  

## 2015-04-18 ENCOUNTER — Ambulatory Visit (INDEPENDENT_AMBULATORY_CARE_PROVIDER_SITE_OTHER): Payer: Medicare Other

## 2015-04-18 DIAGNOSIS — E291 Testicular hypofunction: Secondary | ICD-10-CM

## 2015-04-18 MED ORDER — TESTOSTERONE CYPIONATE 200 MG/ML IM SOLN
200.0000 mg | Freq: Once | INTRAMUSCULAR | Status: AC
  Administered 2015-04-18: 200 mg via INTRAMUSCULAR

## 2015-04-18 NOTE — Progress Notes (Signed)
Testosterone IM Injection  Due to Hypogonadism patient is present today for a Testosterone Injection.  Medication: Testosterone Cypionate Dose: 40mL Location: right upper outer buttocks Lot: [id number] Exp:01/2016  Patient tolerated well, no complications were noted  Preformed by: Toniann Fail, LPN

## 2015-04-20 ENCOUNTER — Other Ambulatory Visit: Payer: Self-pay | Admitting: Pain Medicine

## 2015-04-21 ENCOUNTER — Telehealth: Payer: Self-pay | Admitting: Urology

## 2015-04-21 NOTE — Telephone Encounter (Signed)
Patient's nurse visit on 05/02/2015 should be an office visit with me or Ria Comment.  He is due for his 6 month follow up at that time.

## 2015-04-25 NOTE — Telephone Encounter (Signed)
CHANGED APPOINTMENT TO AN ov AND DEPO INJECTION. I CALLED AND LEFT A MESSAGE ON THE PATIENT'S VM THAT I HAD CHANGED IT.  MICHELLE

## 2015-05-02 ENCOUNTER — Ambulatory Visit (INDEPENDENT_AMBULATORY_CARE_PROVIDER_SITE_OTHER): Payer: Medicare Other | Admitting: Urology

## 2015-05-02 ENCOUNTER — Encounter: Payer: Self-pay | Admitting: Urology

## 2015-05-02 VITALS — BP 159/92 | HR 98 | Ht 70.0 in | Wt 252.4 lb

## 2015-05-02 DIAGNOSIS — N528 Other male erectile dysfunction: Secondary | ICD-10-CM

## 2015-05-02 DIAGNOSIS — E291 Testicular hypofunction: Secondary | ICD-10-CM

## 2015-05-02 DIAGNOSIS — E7800 Pure hypercholesterolemia, unspecified: Secondary | ICD-10-CM | POA: Diagnosis not present

## 2015-05-02 DIAGNOSIS — N401 Enlarged prostate with lower urinary tract symptoms: Secondary | ICD-10-CM

## 2015-05-02 DIAGNOSIS — N529 Male erectile dysfunction, unspecified: Secondary | ICD-10-CM

## 2015-05-02 DIAGNOSIS — N138 Other obstructive and reflux uropathy: Secondary | ICD-10-CM

## 2015-05-02 MED ORDER — TESTOSTERONE CYPIONATE 200 MG/ML IM SOLN
200.0000 mg | Freq: Once | INTRAMUSCULAR | Status: AC
  Administered 2015-05-02: 200 mg via INTRAMUSCULAR

## 2015-05-02 NOTE — Progress Notes (Signed)
9:59 PM   Gerald Ellis 28-Sep-1965 [id number]  Referring provider: Maryland Pink, MD [address] [facility] [place], [place] [address]  Chief Complaint  Patient presents with  . Hypogonadism    follow up    HPI: Patient is a 49 year old white male with hypogonadism, erectile dysfunction and BPH with LUTS who presents today for his biannual office visit.  Hypogonadism Patient presented with the symptoms of reduced libido,  erectile dysfunction, an increase of body fat, a decrease in physical and work performance, disturbances in sleep patterns, decreased energy and motivation, a decrease in cognitive function and mood changes.  This is indicated by his responses to the ADAM questionnaire.   He is currently managing his hypogonadism with testosterone cypionate 200mg /mL, 2cc IM every three weeks.          Androgen Deficiency in the Aging Male      05/02/15 1000       Androgen Deficiency in the Aging Male   Do you have a decrease in libido (sex drive) No     Do you have lack of energy Yes     Do you have a decrease in strength and/or endurance Yes     Have you lost height Yes     Have you noticed a decreased "enjoyment of life" Yes     Are you sad and/or grumpy Yes     Are your erections less strong Yes     Have you noticed a recent deterioration in your ability to play sports Yes     Are you falling asleep after dinner Yes     Has there been a recent deterioration in your work performance Yes        BPH with LUTS Today, his IPSS score is 8, which is moderate symptomatology. He is pleased with his urinary symptoms.   He is not experiencing any dysuria, suprapubic pain or gross hematuria. He also denies any fevers, chills, nausea or vomiting.  The patient does not have a family history of prostate cancer.         IPSS      05/02/15 1000       International Prostate Symptom Score   How often have you had the sensation of not emptying your bladder? Less  than 1 in 5     How often have you had to urinate less than every two hours? About half the time     How often have you found you stopped and started again several times when you urinated? Less than 1 in 5 times     How often have you found it difficult to postpone urination? Not at All     How often have you had a weak urinary stream? Less than 1 in 5 times     How often have you had to strain to start urination? Not at All     How many times did you typically get up at night to urinate? 2 Times     Total IPSS Score 8     Quality of Life due to urinary symptoms   If you were to spend the rest of your life with your urinary condition just the way it is now how would you feel about that? Pleased        Score:  1-7 Mild 8-19 Moderate 20-35 Severe   Erectile dysfunction Patient's SH IM score today is 13. This is a mild-moderate erectile dysfunction. He responds well to  PDE 5 inhibitors, but he finds them cost prohibitive. His current sexual partner, his wife, is not interested in sexual activity.     SHIM      05/02/15 1030       SHIM: Over the last 6 months:   How do you rate your confidence that you could get and keep an erection? Very Low     When you had erections with sexual stimulation, how often were your erections hard enough for penetration (entering your partner)? A Few Times (much less than half the time)     During sexual intercourse, how often were you able to maintain your erection after you had penetrated (entered) your partner? Very Difficult     During sexual intercourse, how difficult was it to maintain your erection to completion of intercourse? Difficult     When you attempted sexual intercourse, how often was it satisfactory for you? Not Difficult     SHIM Total Score   SHIM 13        Score: 1-7 Severe ED 8-11 Moderate ED 12-16 Mild-Moderate ED 17-21 Mild ED 22-25 No ED   PMH: Past Medical History  Diagnosis Date  . Hypercholesteremia   .  Hypertension   . Diabetes mellitus without complication ([place])   . Hemorrhoids   . Environmental allergies   . Lumbago   . Childhood asthma   . Hypogonadism in male   . Over weight   . Benign enlargement of prostate   . Erectile dysfunction   . Bronchitis     Surgical History: Past Surgical History  Procedure Laterality Date  . Cholecystectomy    . Back surgery  1992    Home Medications:    Medication List       This list is accurate as of: 05/02/15 11:59 PM.  Always use your most recent med list.               COOL BLOOD GLUCOSE TEST STRIPS VI     cyclobenzaprine 10 MG tablet  Commonly known as:  FLEXERIL  Take 10 mg by mouth at bedtime.     DHA-EPA-VITAMIN E PO  Take 1 tablet by mouth daily.     diclofenac sodium 1 % Gel  Commonly known as:  VOLTAREN  Apply 2-4 g topically 4 (four) times daily.     fluticasone 50 MCG/ACT nasal spray  Commonly known as:  FLONASE  Place 1 spray into both nostrils daily.     gabapentin 300 MG capsule  Commonly known as:  NEURONTIN  Take 300 mg by mouth at bedtime. 2 capsules at bedtime     glipiZIDE 10 MG tablet  Commonly known as:  GLUCOTROL  Take 10 mg by mouth daily before breakfast.     hydrochlorothiazide 12.5 MG tablet  Commonly known as:  HYDRODIURIL  Take by mouth.     HYDROcodone-acetaminophen 5-325 MG tablet  Commonly known as:  NORCO/VICODIN  Limit 1 tab per mouth per day or twice a day if tolerated     insulin detemir 100 UNIT/ML injection  Commonly known as:  LEVEMIR  Inject 28 Units into the skin every morning.     insulin lispro 100 UNIT/ML injection  Commonly known as:  HUMALOG  Inject 15 Units into the skin 3 (three) times daily with meals.     insulin regular 100 units/mL injection  Commonly known as:  NOVOLIN R,HUMULIN R  Reported on 05/03/2015     Lancets Misc  lisinopril 20 MG tablet  Commonly known as:  PRINIVIL,ZESTRIL  Take 20 mg by mouth 2 (two) times daily.     metFORMIN 1000  MG tablet  Commonly known as:  GLUCOPHAGE  Take 1,000 mg by mouth 2 (two) times daily with a meal.     multivitamin with minerals tablet  Take 1 tablet by mouth daily.     pantoprazole 40 MG tablet  Commonly known as:  PROTONIX  TAKE 1 TABLET BY MOUTH EVERY DAY     pioglitazone 45 MG tablet  Commonly known as:  ACTOS  Take 45 mg by mouth daily.     PROAIR HFA 108 (90 BASE) MCG/ACT inhaler  Generic drug:  albuterol  Inhale into the lungs.     simvastatin 40 MG tablet  Commonly known as:  ZOCOR  Take 40 mg by mouth daily.     testosterone cypionate 200 MG/ML injection  Commonly known as:  DEPOTESTOSTERONE CYPIONATE  Inject 200 mg into the muscle every 21 ( twenty-one) days.     traMADol 50 MG tablet  Commonly known as:  ULTRAM  Limit 1 tab by mouth a day or twice a day if tolerated        Allergies:  Allergies  Allergen Reactions  . Ibuprofen Shortness Of Breath    Family History: Family History  Problem Relation Age of Onset  . Heart disease Mother     CABG  . Cancer Mother     Disseminated  . Diabetes Father     mother  . Alzheimer's disease    . Hypertension Mother   . Hyperlipidemia Mother   . Kidney disease Neg Hx   . Prostate cancer Neg Hx     Social History:  reports that he has never smoked. He has never used smokeless tobacco. He reports that he drinks alcohol. He reports that he does not use illicit drugs.  ROS: Urological Symptom Review  Patient is experiencing the following symptoms: Frequent urination Get up at night to urinate Erection problems (male only)   Review of Systems  Gastrointestinal (upper)  : Negative for upper GI symptoms  Gastrointestinal (lower) : Negative for lower GI symptoms  Constitutional : Negative for symptoms  Skin: Negative for skin symptoms  Eyes: Negative for eye symptoms  Ear/Nose/Throat : Negative for Ear/Nose/Throat symptoms  Hematologic/Lymphatic: Negative for Hematologic/Lymphatic  symptoms  Cardiovascular : Negative for cardiovascular symptoms  Respiratory : Negative for respiratory symptoms  Endocrine: Negative for endocrine symptoms  Musculoskeletal: Back pain Joint pain  Neurological: Negative for neurological symptoms  Psychologic: Negative for psychiatric symptoms   Physical Exam: BP 159/92 mmHg  Pulse 98  Ht 5\' 10"  (1.778 m)  Wt 252 lb 6.4 oz (114.488 kg)  BMI 36.22 kg/m2  GU: Patient with uncircumcised phallus. Foreskin easily retracted  Urethral meatus is patent.  No penile discharge. No penile lesions or rashes. Scrotum without lesions, cysts, rashes and/or edema.  Testicles are located scrotally bilaterally. No masses are appreciated in the testicles. Left and right epididymis are normal. Rectal: Patient with  normal sphincter tone. Perineum without scarring or rashes. No rectal masses are appreciated. Prostate is approximately 45 grams, no nodules are appreciated. Seminal vesicles are normal.  Laboratory Data: Results for orders placed or performed in visit on 05/02/15  TSH  Result Value Ref Range   TSH 4.570 (H) 0.450 - 4.500 uIU/mL  Lipid panel  Result Value Ref Range   Cholesterol, Total 231 (H) 100 - 199 mg/dL  Triglycerides 1158 (HH) 0 - 149 mg/dL   HDL 24 (L) >39 mg/dL   VLDL Cholesterol Cal Comment 5 - 40 mg/dL   LDL Calculated Comment 0 - 99 mg/dL   Chol/HDL Ratio 9.6 (H) 0.0 - 5.0 ratio units  PSA  Result Value Ref Range   Prostate Specific Ag, Serum 0.4 0.0 - 4.0 ng/mL  Hematocrit  Result Value Ref Range   Hematocrit 54.2 (H) 37.5 - 51.0 %   Lab Results  Component Value Date   WBC 10.2 12/31/2014   HGB 15.4 12/31/2014   HCT 54.2* 05/02/2015   MCV 91.9 12/31/2014   PLT 219 12/31/2014    Lab Results  Component Value Date   CREATININE 0.70 12/31/2014  PSA history:  0.3 ng/mL on 01/28/2013  0.5 ng/mL on 06/28/2013  0.3 ng/mL on 11/18/2013  0.4 ng/mL on 05/23/2014  Lab Results  Component Value Date     PSA 0.4 05/02/2015   PSA 0.4 11/22/2014    Lab Results  Component Value Date   TESTOSTERONE 247* 03/21/2015     Assessment & Plan:    1. BPH (benign prostatic hyperplasia) with LUTS:   Patient's IPSS score is 8/1.   We will continue to monitor.  He will RTC in 6 months for an IPSS score, exam and PSA.  - PSA  2. Hypogonadism in male:   Patient's  hypogonadism is currently being managed with testosterone cypionate injections. He is receiving 400 mg IM every 3 weeks. He will RTC in 3 months for a serum testosterone and hematocrit.    - Hematocrit - lipids - TSH  3. Erectile dysfunction:   Patient's SHIM score is 13. He responds well to PDE5 inhibitors, but finds them cost prohibitive. His wife is not interested in sexual activity. He does not desire a prescription for PDE5 inhibitors because of the cost and his wife's lack of interest at this time.  Return in about 3 months (around 07/31/2015) for HCT and testosterone only.  Zara Council, [facility] [address] [place], [place] [address] [phone]

## 2015-05-02 NOTE — Progress Notes (Signed)
Testosterone IM Injection  Due to Hypogonadism patient is present today for a Testosterone Injection.  Medication: Testosterone Cypionate Dose: 2mL Location: right upper outer buttocks Lot: [id number] Exp:11/2015  Patient tolerated well, no complications were noted  Preformed by: Chelsea Watkins, LPN   

## 2015-05-03 ENCOUNTER — Telehealth: Payer: Self-pay

## 2015-05-03 ENCOUNTER — Ambulatory Visit: Payer: Medicare Other | Attending: Pain Medicine | Admitting: Pain Medicine

## 2015-05-03 VITALS — BP 142/92 | HR 120 | Temp 98.1°F | Resp 18 | Ht 69.0 in | Wt 245.0 lb

## 2015-05-03 DIAGNOSIS — M5136 Other intervertebral disc degeneration, lumbar region: Secondary | ICD-10-CM

## 2015-05-03 DIAGNOSIS — F419 Anxiety disorder, unspecified: Secondary | ICD-10-CM | POA: Insufficient documentation

## 2015-05-03 DIAGNOSIS — M4806 Spinal stenosis, lumbar region: Secondary | ICD-10-CM | POA: Insufficient documentation

## 2015-05-03 DIAGNOSIS — M5416 Radiculopathy, lumbar region: Secondary | ICD-10-CM

## 2015-05-03 DIAGNOSIS — M19019 Primary osteoarthritis, unspecified shoulder: Secondary | ICD-10-CM | POA: Diagnosis not present

## 2015-05-03 DIAGNOSIS — M5481 Occipital neuralgia: Secondary | ICD-10-CM | POA: Insufficient documentation

## 2015-05-03 DIAGNOSIS — M19012 Primary osteoarthritis, left shoulder: Secondary | ICD-10-CM

## 2015-05-03 DIAGNOSIS — M549 Dorsalgia, unspecified: Secondary | ICD-10-CM | POA: Diagnosis present

## 2015-05-03 DIAGNOSIS — M19011 Primary osteoarthritis, right shoulder: Secondary | ICD-10-CM

## 2015-05-03 DIAGNOSIS — F329 Major depressive disorder, single episode, unspecified: Secondary | ICD-10-CM | POA: Insufficient documentation

## 2015-05-03 DIAGNOSIS — Z9889 Other specified postprocedural states: Secondary | ICD-10-CM

## 2015-05-03 DIAGNOSIS — M5126 Other intervertebral disc displacement, lumbar region: Secondary | ICD-10-CM | POA: Insufficient documentation

## 2015-05-03 DIAGNOSIS — M5030 Other cervical disc degeneration, unspecified cervical region: Secondary | ICD-10-CM

## 2015-05-03 DIAGNOSIS — M542 Cervicalgia: Secondary | ICD-10-CM | POA: Diagnosis present

## 2015-05-03 LAB — LIPID PANEL
Chol/HDL Ratio: 9.6 ratio units — ABNORMAL HIGH (ref 0.0–5.0)
Cholesterol, Total: 231 mg/dL — ABNORMAL HIGH (ref 100–199)
HDL: 24 mg/dL — ABNORMAL LOW (ref >39)
Triglycerides: 1158 mg/dL (ref 0–149)

## 2015-05-03 LAB — HEMATOCRIT: Hematocrit: 54.2 % — ABNORMAL HIGH (ref 37.5–51.0)

## 2015-05-03 LAB — PSA: PROSTATE SPECIFIC AG, SERUM: 0.4 ng/mL (ref 0.0–4.0)

## 2015-05-03 LAB — TSH: TSH: 4.57 u[IU]/mL — ABNORMAL HIGH (ref 0.450–4.500)

## 2015-05-03 MED ORDER — HYDROCODONE-ACETAMINOPHEN 5-325 MG PO TABS: ORAL_TABLET | ORAL | Status: DC

## 2015-05-03 MED ORDER — TRAMADOL HCL 50 MG PO TABS: ORAL_TABLET | ORAL | Status: DC

## 2015-05-03 NOTE — Progress Notes (Signed)
Subjective:    Patient ID: Gerald Ellis, male    DOB: 1966-01-21, 49 y.o.   MRN: [id number]  HPI The patient is a 49 year old gentleman who returns to pain management for further evaluation and treatment of pain involving the neck entire back upper and lower extremity regions. The patient is with known degenerative changes of the shoulders as well as the lumbar region and is with prior surgical intervention of the lumbar region. The patient continues to be with significant lumbar lower extremity pain and is involved in further evaluation determining the need for further surgery of the lumbar region. At the present time patient is with pain fairly well-controlled with present medications. We discussed patient's further treatment and will continue medications as prescribed this time and avoid interventional treatment. The patient was with understanding and agreement suggested treatment plan. The patient will follow-up Dr. Kary Kos regarding his neurological evaluation and we will remain available to consider patient for modifications of treatment regimen pending further evaluation of patient's condition. The patient was with understanding and agreed to suggested treatment plan   Review of Systems     Objective:   Physical Exam  There was tenderness to palpation of the splenius capitis and occipitalis musculature regions a moderate degree with moderate tenderness to palpation over the cervical facet cervical paraspinal musculature region. The patient was at unremarkable Spurling's maneuver. There was tenderness to palpation of the acromioclavicular and glenohumeral joint regions a moderate to moderately severe degree. Patient had difficulty performing crepitus. The patient appeared to be with slightly decreased grip strength and Tinel and Phalen's maneuver were without increased pain of significant degree. There was tenderness over the thoracic facet thoracic paraspinal musculature region a moderate  degree with moderate muscle spasms involving the thoracic paraspinal musculature region. No crepitus of the thoracic region was noted. Palpation over the lumbar paraspinal musculature and lumbar facet region was attends to palpation of moderately severe degree there was severely limited range of motion of the lumbar spine. Palpation of the PSIS and PII S region reproduced moderate to severe discomfort. There was mild tenderness of the greater trochanteric region and iliotibial band region Straight leg raising was tolerates approximately 20 without increased pain with dorsiflexion noted. There was negative clonus negative Homans. There was questionable decreased sensation along the L5 dermatomal distribution. Abdomen nontender and no costovertebral tenderness noted..         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  Degenerative joint disease of shoulder Mildly displaced SLAP tear that extends into the biceps anchor with fluid signal intensity between these's of arachnoid deltoid bursa and the glenohumeral joint between the supraspinatus and infraspinatus tendons suspicious for a posterior supraspinatus full-thickness tear, partial tear and was also insertional partial with tearing of the supraspinatus and infraspinatus along the articular surface with moderate supraspinatus and if spinatus subscapularis tendinopathy  Bilateral greater occipital neuralgia  Depression/anxiety    PLAN  Continue present medication tramadol and hydrocodone acetaminophen  F/U PCP Dr. Kary Kos for evaliation of  BP , urological results  and general medical  condition  F/U surgical evaluation. as discussed  Psych evaluation as discussed. Please keep appointment as discussed  F/U  urologist as planned  F/U neurological evaluation. May consider pending follow-up evaluations  May consider radiofrequency rhizolysis or intraspinal procedures pending response to present treatment and F/U evaluation   Patient to call [facility] should patient have concerns prior to scheduled return appointment.

## 2015-05-03 NOTE — Telephone Encounter (Signed)
Spoke with pt in reference to lab results. Made pt aware he needs to f/u with PCP. Pt voiced understanding.

## 2015-05-03 NOTE — Patient Instructions (Addendum)
PLAN   Continue present medication tramadol and hydrocodone acetaminophen  F/U PCP Dr. Kary Kos for evaliation of  BP , urological results and general medical  condition  F/U surgical evaluation. as discussed  Psych evaluation as discussed. Please keep appointment as discussed  F/U urologist as planned  F/U neurological evaluation. May consider pending follow-up evaluations  May consider radiofrequency rhizolysis or intraspinal procedures pending response to present treatment and F/U evaluation   Patient to call [facility] should patient have concerns prior to scheduled return appointment.

## 2015-05-03 NOTE — Progress Notes (Signed)
Safety precautions to be maintained throughout the outpatient stay will include: orient to surroundings, keep bed in low position, maintain call bell within reach at all times, provide assistance with transfer out of bed and ambulation.  

## 2015-05-03 NOTE — Telephone Encounter (Signed)
-----   Message from Nori Riis, PA-C sent at 05/03/2015  8:31 AM EST ----- Patient's triglycerides, TSH and HCT are elevated.  I suggest we stop the testosterone and he needs to talk with his PCP about his thyroid and lipids.

## 2015-05-07 DIAGNOSIS — E7800 Pure hypercholesterolemia, unspecified: Secondary | ICD-10-CM | POA: Insufficient documentation

## 2015-05-16 ENCOUNTER — Ambulatory Visit: Payer: Medicare Other

## 2015-05-26 ENCOUNTER — Other Ambulatory Visit: Payer: Self-pay | Admitting: Pain Medicine

## 2015-06-01 ENCOUNTER — Ambulatory Visit: Payer: Medicare Other | Attending: Pain Medicine | Admitting: Pain Medicine

## 2015-06-01 ENCOUNTER — Encounter: Payer: Self-pay | Admitting: Pain Medicine

## 2015-06-01 VITALS — BP 137/79 | HR 87 | Temp 98.1°F | Resp 16 | Ht 69.0 in | Wt 255.0 lb

## 2015-06-01 DIAGNOSIS — M5126 Other intervertebral disc displacement, lumbar region: Secondary | ICD-10-CM | POA: Insufficient documentation

## 2015-06-01 DIAGNOSIS — M19011 Primary osteoarthritis, right shoulder: Secondary | ICD-10-CM

## 2015-06-01 DIAGNOSIS — X58XXXA Exposure to other specified factors, initial encounter: Secondary | ICD-10-CM | POA: Diagnosis not present

## 2015-06-01 DIAGNOSIS — M5136 Other intervertebral disc degeneration, lumbar region: Secondary | ICD-10-CM | POA: Diagnosis not present

## 2015-06-01 DIAGNOSIS — S43439A Superior glenoid labrum lesion of unspecified shoulder, initial encounter: Secondary | ICD-10-CM | POA: Diagnosis not present

## 2015-06-01 DIAGNOSIS — R51 Headache: Secondary | ICD-10-CM | POA: Insufficient documentation

## 2015-06-01 DIAGNOSIS — M4806 Spinal stenosis, lumbar region: Secondary | ICD-10-CM | POA: Diagnosis not present

## 2015-06-01 DIAGNOSIS — Z9889 Other specified postprocedural states: Secondary | ICD-10-CM

## 2015-06-01 DIAGNOSIS — M5416 Radiculopathy, lumbar region: Secondary | ICD-10-CM

## 2015-06-01 DIAGNOSIS — M75100 Unspecified rotator cuff tear or rupture of unspecified shoulder, not specified as traumatic: Secondary | ICD-10-CM | POA: Diagnosis not present

## 2015-06-01 DIAGNOSIS — F419 Anxiety disorder, unspecified: Secondary | ICD-10-CM | POA: Diagnosis not present

## 2015-06-01 DIAGNOSIS — M5481 Occipital neuralgia: Secondary | ICD-10-CM

## 2015-06-01 DIAGNOSIS — M542 Cervicalgia: Secondary | ICD-10-CM | POA: Diagnosis present

## 2015-06-01 DIAGNOSIS — F329 Major depressive disorder, single episode, unspecified: Secondary | ICD-10-CM | POA: Diagnosis not present

## 2015-06-01 DIAGNOSIS — M19019 Primary osteoarthritis, unspecified shoulder: Secondary | ICD-10-CM | POA: Diagnosis not present

## 2015-06-01 DIAGNOSIS — M5030 Other cervical disc degeneration, unspecified cervical region: Secondary | ICD-10-CM

## 2015-06-01 MED ORDER — HYDROCODONE-ACETAMINOPHEN 5-325 MG PO TABS: ORAL_TABLET | ORAL | Status: DC

## 2015-06-01 MED ORDER — TRAMADOL HCL 50 MG PO TABS: ORAL_TABLET | ORAL | Status: DC

## 2015-06-01 NOTE — Progress Notes (Signed)
Subjective:    Patient ID: Gerald Ellis, male    DOB: Jan 28, 1966, 50 y.o.   MRN: [id number]  HPI  The patient is a 50 year old gentleman who returns to pain management for further evaluation and treatment of pain involving the region of the neck headaches as well as pain of the thoracic and lumbar regions. The patient is with prior surgery of the lumbar region and there has been discussion regarding need for more surgery of the lumbar region. Patient also has significant pain involving the shoulders and has had some gradual improvement of range of motion of the shoulder with continued significantly limited range of motion and continued pain involving the region of the shoulders as well. The patient denies any recent trauma change in events of daily living the call significant change in symptomatology. We discussed patient's condition and will continue medications as prescribed and patient will follow-up with Dr. Bertrum Sol for evaluation of sinus condition and general medical condition as discussed above We also advised patient to proceed with psych evaluation as discussed. The patient was with understanding and agreed to suggested treatment plan. We'll continue tramadol and hydrocodone acetaminophen as discussed and as explained to patient and will consider modifications of treatment pending follow-up evaluations. The patient agreed to suggested treatment plan   Review of Systems     Objective:   Physical Exam  There was tenderness to palpation of paraspinal muscular region cervical region cervical facet region palpation which be produced pain of mild-to-moderate degree. There was mild to moderate tends to palpation over the splenius capitis and a separate talus musculature regions. There was tends to palpation over the region of the thoracic facet thoracic paraspinal musculature region with no crepitus of the thoracic region noted. Patient appeared to be with unremarkable Spurling's maneuver.  Tinel and Phalen's maneuver were without increased pain of significant degree. Patient had difficulty performing drop test. with limited range of motion of the acromioclavicular and glenohumeral joint region noted. Palpation over the lumbar paraspinal must reason lumbar facet region was with moderate to moderately severe tenderness to palpation with significantly limited range of motion of the lumbar spine infection rotation extension lateral bending. There was tends to palpation over the PSIS and PII S region a moderate degree. There was mild tinnitus of the greater trochanteric region iliotibial band region. Straight leg raise was limited to approximately 20 without increased pain with dorsiflexion noted. EHL strength appeared to be decreased. There was questionably decreased sensation of the L5 dermatomal distribution with negative clonus negative Homans. DTRs were difficult to elicit patient had difficulty relaxing. Abdomen was nontender and no costovertebral tenderness noted.      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  Degenerative joint disease of shoulder Mildly displaced SLAP tear that extends into the biceps anchor with fluid signal intensity between these's of arachnoid deltoid bursa and the glenohumeral joint between the supraspinatus and infraspinatus tendons suspicious for a posterior supraspinatus full-thickness tear, partial tear and was also insertional partial with tearing of the supraspinatus and infraspinatus along the articular surface with moderate supraspinatus and if spinatus subscapularis  tendinopathy  Bilateral greater occipital neuralgia  Depression/anxiety   PLAN  Continue present medication tramadol and hydrocodone acetaminophen  F/U PCP Dr. Kary Kos for evaliation of  BP , urological results and general medical  condition as well as sinus condition as discussed  F/U surgical evaluation. as discussed  Psych evaluation as discussed. Need to proceed with psych appointment as discussed  F/U urologist as planned  F/U neurological evaluation. May consider pending follow-up evaluations  May consider radiofrequency rhizolysis or intraspinal procedures pending response to present treatment and F/U evaluation   Patient to call [facility] Management Center should patient have concerns prior to scheduled return appointment.

## 2015-06-01 NOTE — Patient Instructions (Signed)
PLAN  Continue present medication tramadol and hydrocodone acetaminophen  F/U PCP Dr. Kary Kos for evaliation of  BP , urological results and general medical  condition as well as sinus condition as discussed  F/U surgical evaluation. as discussed  Psych evaluation as discussed. Need to proceed with psych appointment as discussed  F/U urologist as planned  F/U neurological evaluation. May consider pending follow-up evaluations  May consider radiofrequency rhizolysis or intraspinal procedures pending response to present treatment and F/U evaluation   Patient to call Pain Management Center should patient have concerns prior to scheduled return appointment.

## 2015-06-01 NOTE — Progress Notes (Signed)
Safety precautions to be maintained throughout the outpatient stay will include: orient to surroundings, keep bed in low position, maintain call bell within reach at all times, provide assistance with transfer out of bed and ambulation.  

## 2015-06-12 ENCOUNTER — Emergency Department
Admission: EM | Admit: 2015-06-12 | Discharge: 2015-06-12 | Disposition: A | Discharge status: Home / Self Care | Payer: Medicare Other | Attending: Emergency Medicine | Admitting: Emergency Medicine

## 2015-06-12 ENCOUNTER — Encounter: Payer: Self-pay | Admitting: Emergency Medicine

## 2015-06-12 ENCOUNTER — Emergency Department: Payer: Medicare Other

## 2015-06-12 DIAGNOSIS — Z7984 Long term (current) use of oral hypoglycemic drugs: Secondary | ICD-10-CM | POA: Diagnosis not present

## 2015-06-12 DIAGNOSIS — R1031 Right lower quadrant pain: Secondary | ICD-10-CM | POA: Diagnosis present

## 2015-06-12 DIAGNOSIS — Z791 Long term (current) use of non-steroidal anti-inflammatories (NSAID): Secondary | ICD-10-CM | POA: Insufficient documentation

## 2015-06-12 DIAGNOSIS — I10 Essential (primary) hypertension: Secondary | ICD-10-CM | POA: Insufficient documentation

## 2015-06-12 DIAGNOSIS — Z7951 Long term (current) use of inhaled steroids: Secondary | ICD-10-CM | POA: Insufficient documentation

## 2015-06-12 DIAGNOSIS — Z794 Long term (current) use of insulin: Secondary | ICD-10-CM | POA: Diagnosis not present

## 2015-06-12 DIAGNOSIS — Z79899 Other long term (current) drug therapy: Secondary | ICD-10-CM | POA: Diagnosis not present

## 2015-06-12 DIAGNOSIS — E119 Type 2 diabetes mellitus without complications: Secondary | ICD-10-CM | POA: Diagnosis not present

## 2015-06-12 DIAGNOSIS — B349 Viral infection, unspecified: Secondary | ICD-10-CM | POA: Insufficient documentation

## 2015-06-12 DIAGNOSIS — R109 Unspecified abdominal pain: Secondary | ICD-10-CM

## 2015-06-12 LAB — URINALYSIS COMPLETE WITH MICROSCOPIC (ARMC ONLY)
BILIRUBIN URINE: NEGATIVE
Bacteria, UA: NONE SEEN
HGB URINE DIPSTICK: NEGATIVE
Leukocytes, UA: NEGATIVE
Nitrite: NEGATIVE
Protein, ur: 100 mg/dL — AB
Specific Gravity, Urine: 1.042 — ABNORMAL HIGH (ref 1.005–1.030)
pH: 6 (ref 5.0–8.0)

## 2015-06-12 LAB — CBC
HEMATOCRIT: 48.9 % (ref 40.0–52.0)
HEMOGLOBIN: 17 g/dL (ref 13.0–18.0)
MCH: 30.2 pg (ref 26.0–34.0)
MCHC: 34.8 g/dL (ref 32.0–36.0)
MCV: 86.6 fL (ref 80.0–100.0)
Platelets: 240 10*3/uL (ref 150–440)
RBC: 5.64 MIL/uL (ref 4.40–5.90)
RDW: 12.4 % (ref 11.5–14.5)
WBC: 12.6 10*3/uL — ABNORMAL HIGH (ref 3.8–10.6)

## 2015-06-12 LAB — POCT RAPID STREP A: Streptococcus, Group A Screen (Direct): NEGATIVE

## 2015-06-12 LAB — COMPREHENSIVE METABOLIC PANEL
ALBUMIN: 3.8 g/dL (ref 3.5–5.0)
ALK PHOS: 65 U/L (ref 38–126)
ALT: 40 U/L (ref 17–63)
ANION GAP: 10 (ref 5–15)
AST: 26 U/L (ref 15–41)
BUN: 15 mg/dL (ref 6–20)
CALCIUM: 8.9 mg/dL (ref 8.9–10.3)
CHLORIDE: 101 mmol/L (ref 101–111)
CO2: 25 mmol/L (ref 22–32)
Creatinine, Ser: 0.7 mg/dL (ref 0.61–1.24)
GFR calc non Af Amer: >60 mL/min (ref >60)
GLUCOSE: 272 mg/dL — AB (ref 65–99)
Potassium: 3.7 mmol/L (ref 3.5–5.1)
SODIUM: 136 mmol/L (ref 135–145)
Total Bilirubin: 1 mg/dL (ref 0.3–1.2)
Total Protein: 7.5 g/dL (ref 6.5–8.1)

## 2015-06-12 LAB — LIPASE, BLOOD: LIPASE: 24 U/L (ref 11–51)

## 2015-06-12 LAB — GLUCOSE, CAPILLARY: Glucose-Capillary: 265 mg/dL — ABNORMAL HIGH (ref 65–99)

## 2015-06-12 MED ORDER — MORPHINE SULFATE (PF) 4 MG/ML IV SOLN
4.0000 mg | Freq: Once | INTRAVENOUS | Status: AC
  Administered 2015-06-12: 4 mg via INTRAVENOUS
  Filled 2015-06-12: qty 1

## 2015-06-12 MED ORDER — ONDANSETRON HCL 4 MG PO TABS: 4.0000 mg | ORAL_TABLET | Freq: Every day | ORAL | Status: DC | PRN

## 2015-06-12 MED ORDER — SODIUM CHLORIDE 0.9 % IV SOLN
1000.0000 mL | Freq: Once | INTRAVENOUS | Status: AC
  Administered 2015-06-12: 1000 mL via INTRAVENOUS

## 2015-06-12 MED ORDER — ONDANSETRON HCL 4 MG/2ML IJ SOLN
4.0000 mg | Freq: Once | INTRAMUSCULAR | Status: AC
  Administered 2015-06-12: 4 mg via INTRAVENOUS
  Filled 2015-06-12: qty 2

## 2015-06-12 NOTE — Discharge Instructions (Signed)

## 2015-06-12 NOTE — ED Notes (Signed)
Pt reports lower adbominal pain with right sided flank pain. Pt reports recent flu symptoms for about a week. Pt reports using alkaseltzer cough and cold and nyquil. Pt reports vomiting, fever, sore throat. Pt reports he has been taking his insulin but hasn't been able to take his PO meds x3 days.

## 2015-06-12 NOTE — ED Notes (Signed)
Pt with lower abd pain, tender with palpation. Nausea. + bowel sounds. BS clear

## 2015-06-12 NOTE — ED Provider Notes (Signed)
[facility] Emergency Department Provider Note  ____________________________________________    I have reviewed the triage vital signs and the nursing notes.   HISTORY  Chief Complaint Abdominal Pain    HPI Gerald Ellis is a 50 y.o. male who reports upper respiratory symptoms over the last 5-6 days including sore throat, cough congestion and fatigue. Over the last 2 days he developed pain in his right flank and right lower quadrant which has been consistent and constant. He denies fevers or chills. He denies a history of similar pain. No history of kidney stones. No blood in his urine. He has had nausea and vomiting and is unable to tolerate by mouth's.     Past Medical History  Diagnosis Date  . Hypercholesteremia   . Hypertension   . Diabetes mellitus without complication ([place])   . Hemorrhoids   . Environmental allergies   . Lumbago   . Childhood asthma   . Hypogonadism in male   . Over weight   . Benign enlargement of prostate   . Erectile dysfunction   . Bronchitis     Patient Active Problem List   Diagnosis Date Noted  . Hypercholesterolemia 05/07/2015  . 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  . Hypogonadism in male 11/22/2014  . BPH with obstruction/lower urinary tract symptoms 11/22/2014  . DDD (degenerative disc disease), lumbar 10/13/2014  . Status post lumbar laminectomy 10/13/2014  . DJD of shoulder 10/13/2014  . Bilateral occipital neuralgia 10/13/2014  . Lumbar radiculopathy 10/13/2014  . DDD (degenerative disc disease), cervical 10/13/2014    Past Surgical History  Procedure Laterality Date  . Cholecystectomy    . Back surgery  1992    Current Outpatient Rx  Name  Route  Sig  Dispense  Refill  . albuterol (PROAIR HFA) 108 (90 BASE) MCG/ACT inhaler   Inhalation   Inhale into the lungs.         . cyclobenzaprine (FLEXERIL) 10 MG tablet   Oral   Take 10 mg by mouth at bedtime.         .  DHA-EPA-VITAMIN E PO   Oral   Take 1 tablet by mouth daily.         . diclofenac sodium (VOLTAREN) 1 % GEL   Topical   Apply 2-4 g topically 4 (four) times daily.         . fluticasone (FLONASE) 50 MCG/ACT nasal spray   Each Nare   Place 1 spray into both nostrils daily.         Marland Kitchen gabapentin (NEURONTIN) 300 MG capsule   Oral   Take 300 mg by mouth at bedtime. 2 capsules at bedtime         . glipiZIDE (GLUCOTROL) 10 MG tablet   Oral   Take 10 mg by mouth daily before breakfast.         . Glucose Blood (COOL BLOOD GLUCOSE TEST STRIPS VI)               . hydrochlorothiazide (HYDRODIURIL) 12.5 MG tablet   Oral   Take by mouth.         Marland Kitchen HYDROcodone-acetaminophen (NORCO/VICODIN) 5-325 MG tablet      Limit 1 tab per mouth per day or twice a day if tolerated   50 tablet   0   . insulin detemir (LEVEMIR) 100 UNIT/ML injection   Subcutaneous   Inject 28 Units into the skin every morning.          Marland Kitchen  insulin lispro (HUMALOG) 100 UNIT/ML injection   Subcutaneous   Inject 15 Units into the skin 3 (three) times daily with meals.          . insulin regular (NOVOLIN R,HUMULIN R) 100 units/mL injection      Reported on 05/03/2015         . Lancets MISC               . lisinopril (PRINIVIL,ZESTRIL) 20 MG tablet   Oral   Take 20 mg by mouth 2 (two) times daily.         . metFORMIN (GLUCOPHAGE) 1000 MG tablet   Oral   Take 1,000 mg by mouth 2 (two) times daily with a meal.         . Multiple Vitamins-Minerals (MULTIVITAMIN WITH MINERALS) tablet   Oral   Take 1 tablet by mouth daily.         . pantoprazole (PROTONIX) 40 MG tablet      TAKE 1 TABLET BY MOUTH EVERY DAY         . pioglitazone (ACTOS) 45 MG tablet   Oral   Take 45 mg by mouth daily.         . simvastatin (ZOCOR) 40 MG tablet   Oral   Take 40 mg by mouth daily.         Marland Kitchen testosterone cypionate (DEPOTESTOTERONE CYPIONATE) 200 MG/ML injection   Intramuscular   Inject  200 mg into the muscle every 21 ( twenty-one) days. Reported on 06/01/2015         . traMADol (ULTRAM) 50 MG tablet      Limit 1 tab by mouth a day or twice a day if tolerated   40 tablet   0     Allergies Ibuprofen  Family History  Problem Relation Age of Onset  . Heart disease Mother     CABG  . Cancer Mother     Disseminated  . Diabetes Father     mother  . Alzheimer's disease    . Hypertension Mother   . Hyperlipidemia Mother   . Kidney disease Neg Hx   . Prostate cancer Neg Hx     Social History Social History  Substance Use Topics  . Smoking status: Never Smoker   . Smokeless tobacco: Never Used  . Alcohol Use: 0.0 oz/week    0 Standard drinks or equivalent per week     Comment: occasionally    Review of Systems  Constitutional: Negative for fever. Eyes: Negative for visual changes. ENT: Positive for sore throat Cardiovascular: Negative for chest pain. Respiratory: Negative for shortness of breath. Gastrointestinal: Positive for nausea and vomiting, no diarrhea Genitourinary: Negative for dysuria. No hematuria Musculoskeletal: Negative for back pain. Skin: Negative for rash. Neurological: Negative for headaches Psychiatric: No anxiety   ____________________________________________   PHYSICAL EXAM:  VITAL SIGNS: ED Triage Vitals  Enc Vitals Group     BP 06/12/15 1233 156/99 mmHg     Pulse Rate 06/12/15 1233 102     Resp 06/12/15 1233 20     Temp 06/12/15 1233 99.1 F (37.3 C)     Temp Source 06/12/15 1233 Oral     SpO2 06/12/15 1233 95 %     Weight 06/12/15 1233 255 lb (115.667 kg)     Height 06/12/15 1233 5\' 9"  (1.753 m)     Head Cir --      Peak Flow --      Pain Score 06/12/15  1251 8     Pain Loc --      Pain Edu? --      Excl. in [place]? --      Constitutional: Alert and oriented. Well appearing and in no distress. Eyes: Conjunctivae are normal.  ENT   Head: Normocephalic and atraumatic.   Mouth/Throat: Mucous membranes  are moist. Cardiovascular: Normal rate, regular rhythm. Normal and symmetric distal pulses are present in all extremities.  Respiratory: Normal respiratory effort without tachypnea nor retractions.  Gastrointestinal: Mild to moderate tenderness in the right lower quadrant and right flank. No distention. There is no CVA tenderness. Genitourinary: deferred Musculoskeletal: Nontender with normal range of motion in all extremities. No lower extremity tenderness nor edema. Neurologic:  Normal speech and language. No gross focal neurologic deficits are appreciated. Skin:  Skin is warm, dry and intact. No rash noted. Psychiatric: Mood and affect are normal. Patient exhibits appropriate insight and judgment.  ____________________________________________    LABS (pertinent positives/negatives)  Labs Reviewed  COMPREHENSIVE METABOLIC PANEL - Abnormal; Notable for the following:    Glucose, Bld 272 (*)    All other components within normal limits  CBC - Abnormal; Notable for the following:    WBC 12.6 (*)    All other components within normal limits  URINALYSIS COMPLETEWITH MICROSCOPIC ([facility] ONLY) - Abnormal; Notable for the following:    Color, Urine YELLOW (*)    APPearance CLEAR (*)    Glucose, UA >500 (*)    Ketones, ur TRACE (*)    Specific Gravity, Urine 1.042 (*)    Protein, ur 100 (*)    Squamous Epithelial / LPF 0-5 (*)    All other components within normal limits  GLUCOSE, CAPILLARY - Abnormal; Notable for the following:    Glucose-Capillary 265 (*)    All other components within normal limits  CULTURE, GROUP A STREP ([place])  LIPASE, BLOOD  POCT RAPID STREP A    ____________________________________________   EKG  None  ____________________________________________    RADIOLOGY I have personally reviewed any xrays that were ordered on this patient: CT scan unremarkable, noted coronary disease to patient   ____________________________________________   PROCEDURES  Procedure(s) performed: none  Critical Care performed: none  ____________________________________________   INITIAL IMPRESSION / ASSESSMENT AND PLAN / ED COURSE  Pertinent labs & imaging results that were available during my care of the patient were reviewed by me and considered in my medical decision making (see chart for details).  Unclear cause of patient's pain. His labs are overall unremarkable and his urine is normal. Differential includes appendicitis, enteritis, ureterolithiasis. We will give IV fluids, IV Zofran, IV morphine and obtain CT renal stone study  CT scan is negative for acute abdominal issues. Patient is feeling better after fluids, Zofran and morphine. I suspect a viral illnesses causing him to develop nausea and abdominal cramping. Recommend supportive care with instructions to return to the ED if worsening symptoms.  ____________________________________________   FINAL CLINICAL IMPRESSION(S) / ED DIAGNOSES  Final diagnoses:  Flank pain, acute  Viral illness     Lavonia Drafts, MD 06/12/15 2038

## 2015-06-14 LAB — CULTURE, GROUP A STREP (THRC)

## 2015-06-29 ENCOUNTER — Ambulatory Visit: Payer: Medicare Other | Attending: Pain Medicine | Admitting: Pain Medicine

## 2015-06-29 ENCOUNTER — Encounter: Payer: Self-pay | Admitting: Pain Medicine

## 2015-06-29 VITALS — BP 152/92 | HR 89 | Temp 98.5°F | Resp 18 | Ht 69.0 in | Wt 245.0 lb

## 2015-06-29 DIAGNOSIS — M5030 Other cervical disc degeneration, unspecified cervical region: Secondary | ICD-10-CM

## 2015-06-29 DIAGNOSIS — M5126 Other intervertebral disc displacement, lumbar region: Secondary | ICD-10-CM | POA: Diagnosis not present

## 2015-06-29 DIAGNOSIS — M19019 Primary osteoarthritis, unspecified shoulder: Secondary | ICD-10-CM | POA: Insufficient documentation

## 2015-06-29 DIAGNOSIS — F419 Anxiety disorder, unspecified: Secondary | ICD-10-CM | POA: Insufficient documentation

## 2015-06-29 DIAGNOSIS — Z9889 Other specified postprocedural states: Secondary | ICD-10-CM | POA: Diagnosis not present

## 2015-06-29 DIAGNOSIS — X58XXXA Exposure to other specified factors, initial encounter: Secondary | ICD-10-CM | POA: Insufficient documentation

## 2015-06-29 DIAGNOSIS — F329 Major depressive disorder, single episode, unspecified: Secondary | ICD-10-CM | POA: Diagnosis not present

## 2015-06-29 DIAGNOSIS — M546 Pain in thoracic spine: Secondary | ICD-10-CM | POA: Diagnosis present

## 2015-06-29 DIAGNOSIS — M5136 Other intervertebral disc degeneration, lumbar region: Secondary | ICD-10-CM

## 2015-06-29 DIAGNOSIS — M4806 Spinal stenosis, lumbar region: Secondary | ICD-10-CM | POA: Diagnosis not present

## 2015-06-29 DIAGNOSIS — M19011 Primary osteoarthritis, right shoulder: Secondary | ICD-10-CM

## 2015-06-29 DIAGNOSIS — S43439A Superior glenoid labrum lesion of unspecified shoulder, initial encounter: Secondary | ICD-10-CM | POA: Diagnosis not present

## 2015-06-29 DIAGNOSIS — M5481 Occipital neuralgia: Secondary | ICD-10-CM | POA: Diagnosis not present

## 2015-06-29 DIAGNOSIS — M19012 Primary osteoarthritis, left shoulder: Secondary | ICD-10-CM

## 2015-06-29 DIAGNOSIS — M542 Cervicalgia: Secondary | ICD-10-CM | POA: Diagnosis present

## 2015-06-29 DIAGNOSIS — M5416 Radiculopathy, lumbar region: Secondary | ICD-10-CM

## 2015-06-29 MED ORDER — TRAMADOL HCL 50 MG PO TABS: ORAL_TABLET | ORAL | Status: DC

## 2015-06-29 MED ORDER — HYDROCODONE-ACETAMINOPHEN 5-325 MG PO TABS: ORAL_TABLET | ORAL | Status: DC

## 2015-06-29 NOTE — Patient Instructions (Addendum)
PLAN  Continue present medication tramadol and hydrocodone acetaminophen  F/U PCP Dr. Kary Kos for evaliation of  BP, URI symptoms ,and general medical  condition as discussed  F/U surgical evaluation. as discussed  Psych evaluation as discussed. Need to proceed with psych appointment as discussed Ask the nurses and secretary the date of your psych evaluation  F/U urologist as planned  F/U neurological evaluation. May consider pending follow-up evaluations  May consider radiofrequency rhizolysis or intraspinal procedures pending response to present treatment and F/U evaluation   Patient to call [facility] Management Center should patient have concerns prior to scheduled return appointment.

## 2015-06-29 NOTE — Progress Notes (Signed)
Safety precautions to be maintained throughout the outpatient stay will include: orient to surroundings, keep bed in low position, maintain call bell within reach at all times, provide assistance with transfer out of bed and ambulation.  

## 2015-06-29 NOTE — Progress Notes (Signed)
Subjective:    Patient ID: Gerald Ellis, male    DOB: Feb 06, 1966, 50 y.o.   MRN: [id number]  HPI  The patient is a 50 year old gentleman who returns to pain management for further evaluation and treatment of pain involving the neck upper back lower back and lower extremity region with pain in the shoulders as well. The patient has had URI symptoms. We have asked patient to follow-up with Dr. Kary Kos as discussed. He'll also discussed patient undergoing psych evaluation. Patient is with elements of anxiety and depression. At the present time patient will continue hydrocodone acetaminophen and tramadol. We have discussed need for psych evaluation prior to considering modifications of patient's treatment regimen. The patient will proceed with psych evaluation as discussed. The patient was without any trauma change in events of daily living the call significant change in symptomatology. We will remain available to consider patient for modification of treatment pending follow-up evaluation as discussed and pending further assessment of patient's condition. The patient was with understanding and agreement suggested treatment plan               Review of Systems     Objective:   Physical Exam  There was tenderness to palpation of the splenius capitis and occipitalis region of mild to moderate degree. The patient was with tenderness over the cervical facet cervical paraspinal musculature region of mild to moderate degree. Palpation of the acromioclavicular and glenohumeral joint regions reproduces moderate discomfort. The patient had difficulty performing drop test. The patient was able to ABduct the upper extremities to 90 and had difficulty completing drop test . Tinel and Phalen's maneuver were without significant increased pain and patient appeared to be with bilaterally equal grip strength. Palpation over the thoracic facet thoracic paraspinal musculature region was attends to  palpation of moderate degree with moderate muscle spasms of the thoracic region noted. No crepitus of the thoracic region was noted. Palpation over the lumbar paraspinal must reason lumbar facet region was attends to palpation of moderate degree with severe muscle spasm over the lumbar paraspinal musculature region. Lateral bending rotation extension and palpation over the lumbar facets reproduced moderately severe discomfort. There was tenderness of moderate degree of the PSIS and PII S region as well as mild tenderness of the greater trochanteric region and iliotibial band region. Straight leg raise was tolerates approximately 20 without a definite increased pain with dorsiflexion noted. There was questionably decreased sensation of the L5 dermatomal distribution with negative clonus negative Homans. 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  Degenerative joint disease of shoulder Mildly displaced SLAP tear that extends into the biceps anchor with fluid signal intensity between these's of arachnoid deltoid bursa and the glenohumeral joint between the supraspinatus and infraspinatus tendons suspicious for a posterior supraspinatus full-thickness tear, partial tear and was also insertional partial with tearing of the supraspinatus and infraspinatus along the articular surface with moderate supraspinatus and if spinatus subscapularis tendinopathy  Bilateral greater occipital  neuralgia  Depression/anxiety     PLAN  Continue present medication  tramadol and hydrocodone acetaminophen  F/U PCP Dr. Kary Kos for evaliation of  BP, URI symptoms ,and general medical  condition as discussed  F/U surgical evaluation. as discussed  Psych evaluation as discussed. Need to proceed with psych appointment as discussed Ask the nurses and secretary the date of your psych evaluation  F/U urologist as planned  F/U neurological evaluation. May consider pending follow-up evaluations  May consider radiofrequency rhizolysis or intraspinal procedures pending response to present treatment and F/U evaluation   Patient to call Pain Management Center should patient have concerns prior to scheduled return appointment.

## 2015-07-27 ENCOUNTER — Ambulatory Visit: Payer: Medicare Other | Attending: Pain Medicine | Admitting: Pain Medicine

## 2015-07-27 ENCOUNTER — Encounter: Payer: Self-pay | Admitting: Pain Medicine

## 2015-07-27 VITALS — BP 140/87 | HR 93 | Temp 97.3°F | Resp 18 | Ht 70.0 in | Wt 255.0 lb

## 2015-07-27 DIAGNOSIS — F329 Major depressive disorder, single episode, unspecified: Secondary | ICD-10-CM | POA: Diagnosis not present

## 2015-07-27 DIAGNOSIS — M19019 Primary osteoarthritis, unspecified shoulder: Secondary | ICD-10-CM | POA: Insufficient documentation

## 2015-07-27 DIAGNOSIS — Z9889 Other specified postprocedural states: Secondary | ICD-10-CM

## 2015-07-27 DIAGNOSIS — M19011 Primary osteoarthritis, right shoulder: Secondary | ICD-10-CM

## 2015-07-27 DIAGNOSIS — M4806 Spinal stenosis, lumbar region: Secondary | ICD-10-CM | POA: Insufficient documentation

## 2015-07-27 DIAGNOSIS — S43439A Superior glenoid labrum lesion of unspecified shoulder, initial encounter: Secondary | ICD-10-CM | POA: Insufficient documentation

## 2015-07-27 DIAGNOSIS — M5136 Other intervertebral disc degeneration, lumbar region: Secondary | ICD-10-CM | POA: Insufficient documentation

## 2015-07-27 DIAGNOSIS — M5126 Other intervertebral disc displacement, lumbar region: Secondary | ICD-10-CM | POA: Diagnosis not present

## 2015-07-27 DIAGNOSIS — M5481 Occipital neuralgia: Secondary | ICD-10-CM | POA: Diagnosis not present

## 2015-07-27 DIAGNOSIS — M5416 Radiculopathy, lumbar region: Secondary | ICD-10-CM

## 2015-07-27 DIAGNOSIS — F419 Anxiety disorder, unspecified: Secondary | ICD-10-CM | POA: Diagnosis not present

## 2015-07-27 DIAGNOSIS — R51 Headache: Secondary | ICD-10-CM | POA: Diagnosis not present

## 2015-07-27 DIAGNOSIS — M19012 Primary osteoarthritis, left shoulder: Secondary | ICD-10-CM

## 2015-07-27 DIAGNOSIS — M542 Cervicalgia: Secondary | ICD-10-CM | POA: Diagnosis present

## 2015-07-27 DIAGNOSIS — M5030 Other cervical disc degeneration, unspecified cervical region: Secondary | ICD-10-CM

## 2015-07-27 DIAGNOSIS — X58XXXA Exposure to other specified factors, initial encounter: Secondary | ICD-10-CM | POA: Diagnosis not present

## 2015-07-27 DIAGNOSIS — M25511 Pain in right shoulder: Secondary | ICD-10-CM | POA: Diagnosis present

## 2015-07-27 DIAGNOSIS — M25512 Pain in left shoulder: Secondary | ICD-10-CM | POA: Diagnosis present

## 2015-07-27 MED ORDER — TRAMADOL HCL 50 MG PO TABS: ORAL_TABLET | ORAL | Status: DC

## 2015-07-27 MED ORDER — HYDROCODONE-ACETAMINOPHEN 5-325 MG PO TABS: ORAL_TABLET | ORAL | Status: DC

## 2015-07-27 NOTE — Progress Notes (Signed)
Subjective:    Patient ID: Gerald Ellis, male    DOB: 11/07/1965, 50 y.o.   MRN: [id number]  HPI  The patient is a 50 year old gentleman who returns to pain management for further evaluation and treatment of pain involving the neck shoulders entire back upper and lower extremity regions. The patient denies any trauma since prior visit to pain management. The patient states that he continues to be with some you are I symptoms. The patient has received several courses of antibiotics. The patient will follow-up with Dr. Kary Kos as discussed. We will continue present medications and will avoid interventional treatment. The patient also will proceed with psych evaluation as discussed and we will consider modifications of treatment pending results of psych evaluation and follow-up evaluation of patient and pain management. The patient continues to have significant lower back and lower extremity pain as well as pain involving the shoulders with headaches. The patient states that there is been no significant change in his symptoms we will remain available to consider modification of treatment pending follow-up evaluation and assessment. We'll continue tramadol and hydrocodone acetaminophen as prescribed this time. All agreed to suggested treatment plan      Review of Systems     Objective:   Physical Exam   Palpation of the splenius capitis and occipitalis musculature region reproduced moderate discomfort. There was moderate tenderness of the cervical facet cervical paraspinal musculature region. The patient appeared to be with moderate tenderness over the thoracic facet thoracic paraspinal musculature region with no crepitus of the thoracic region noted. Palpation over the region of the acromioclavicular and glenohumeral joint regions associated with moderate increase of pain. The patient has significantly edited range of motion of the shoulders with inability to perform drop test. Patient  appeared to be with slightly decreased grip strength and Tinel and Phalen's maneuver were without increased pain of significant degree. Palpation over the lumbar paraspinal must reason lumbar facet region associated with moderate to moderately severe discomfort with lateral bending rotation extension and palpation of the lumbar facets reproducing moderately severe discomfort. Straight leg raising was limited to approximately 20 with questionably increased pain with dorsiflexion noted. EHL strength appeared to be slightly decreased. There was negative clonus negative Homans. There was questionably decreased sensation of the L5 dermatomal distribution with negative clonus negative Homans. Palpation over the PSIS and PII S region reproduced moderately severe discomfort. There was mild tenderness of the greater trochanteric region and iliotibial band region. Abdomen was nontender with no costovertebral tenderness noted.     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  Degenerative joint disease of shoulder Mildly displaced SLAP tear that extends into the biceps anchor with fluid signal intensity between these's of arachnoid deltoid bursa and the glenohumeral joint between the supraspinatus and infraspinatus tendons suspicious for a posterior supraspinatus full-thickness tear, partial tear and was also insertional partial with tearing of the supraspinatus and infraspinatus along the articular surface with moderate supraspinatus and if spinatus subscapularis tendinopathy  Bilateral greater occipital  neuralgia  Depression/anxiety       PLAN  Continue present medication tramadol and hydrocodone acetaminophen  F/U PCP Dr. Kary Kos for evaliation of  BP URI  symptoms and general medical  condition as discussed  F/U surgical evaluation. as discussed  Psych evaluation as discussed. Need to proceed with psych appointment as discussed   F/U urologist as previously discussed  F/U neurological evaluation. May consider PNCV/EMG studies and other studies pending follow-up evaluations  May consider radiofrequency rhizolysis or intraspinal procedures pending response to present treatment and F/U evaluation We will avoid such procedures at this time  Patient to call Pain Management Center should patient have concerns prior to scheduled return appointment.

## 2015-07-27 NOTE — Progress Notes (Signed)
Safety precautions to be maintained throughout the outpatient stay will include: orient to surroundings, keep bed in low position, maintain call bell within reach at all times, provide assistance with transfer out of bed and ambulation.  

## 2015-07-27 NOTE — Patient Instructions (Addendum)
PLAN  Continue present medication tramadol and hydrocodone acetaminophen  F/U PCP Dr. Kary Kos for evaliation of  BP and general medical  condition as discussed  F/U surgical evaluation. as discussed  Psych evaluation as discussed. Need to proceed with psych appointment as discussed   F/U urologist as previously discussed  F/U neurological evaluation. May consider PNCV/EMG studies and other studies pending follow-up evaluations  May consider radiofrequency rhizolysis or intraspinal procedures pending response to present treatment and F/U evaluation We will avoid such procedures at this time  Patient to call Pain Management Center should patient have concerns prior to scheduled return appointment.

## 2015-07-31 ENCOUNTER — Other Ambulatory Visit: Payer: Medicare Other

## 2015-07-31 DIAGNOSIS — Z79899 Other long term (current) drug therapy: Secondary | ICD-10-CM

## 2015-07-31 DIAGNOSIS — E291 Testicular hypofunction: Secondary | ICD-10-CM

## 2015-08-01 LAB — LIPID PANEL
CHOLESTEROL TOTAL: 264 mg/dL — AB (ref 100–199)
Chol/HDL Ratio: 9.8 ratio units — ABNORMAL HIGH (ref 0.0–5.0)
HDL: 27 mg/dL — AB (ref >39)
TRIGLYCERIDES: 830 mg/dL — AB (ref 0–149)

## 2015-08-01 LAB — HEMATOCRIT: HEMATOCRIT: 48.9 % (ref 37.5–51.0)

## 2015-08-01 LAB — TSH: TSH: 1.88 u[IU]/mL (ref 0.450–4.500)

## 2015-08-01 LAB — TESTOSTERONE: Testosterone: 165 ng/dL — ABNORMAL LOW (ref 348–1197)

## 2015-08-07 ENCOUNTER — Ambulatory Visit: Payer: Medicare Other | Admitting: Anesthesiology

## 2015-08-07 ENCOUNTER — Other Ambulatory Visit: Payer: Self-pay | Admitting: Gastroenterology

## 2015-08-07 ENCOUNTER — Ambulatory Visit
Admission: RE | Admit: 2015-08-07 | Discharge: 2015-08-07 | Disposition: A | Discharge status: Home / Self Care | Payer: Medicare Other | Source: Ambulatory Visit | Attending: Gastroenterology | Admitting: Gastroenterology

## 2015-08-07 ENCOUNTER — Encounter
Admission: RE | Disposition: A | Discharge status: Home / Self Care | Payer: Self-pay | Source: Ambulatory Visit | Attending: Gastroenterology

## 2015-08-07 ENCOUNTER — Telehealth: Payer: Self-pay

## 2015-08-07 ENCOUNTER — Encounter: Payer: Self-pay | Admitting: *Deleted

## 2015-08-07 DIAGNOSIS — I10 Essential (primary) hypertension: Secondary | ICD-10-CM | POA: Diagnosis not present

## 2015-08-07 DIAGNOSIS — R1013 Epigastric pain: Secondary | ICD-10-CM | POA: Diagnosis not present

## 2015-08-07 DIAGNOSIS — K645 Perianal venous thrombosis: Secondary | ICD-10-CM | POA: Diagnosis not present

## 2015-08-07 DIAGNOSIS — E669 Obesity, unspecified: Secondary | ICD-10-CM | POA: Diagnosis not present

## 2015-08-07 DIAGNOSIS — Z794 Long term (current) use of insulin: Secondary | ICD-10-CM | POA: Insufficient documentation

## 2015-08-07 DIAGNOSIS — Z888 Allergy status to other drugs, medicaments and biological substances status: Secondary | ICD-10-CM | POA: Diagnosis not present

## 2015-08-07 DIAGNOSIS — N400 Enlarged prostate without lower urinary tract symptoms: Secondary | ICD-10-CM | POA: Insufficient documentation

## 2015-08-07 DIAGNOSIS — M545 Low back pain: Secondary | ICD-10-CM | POA: Diagnosis not present

## 2015-08-07 DIAGNOSIS — E7800 Pure hypercholesterolemia, unspecified: Secondary | ICD-10-CM | POA: Insufficient documentation

## 2015-08-07 DIAGNOSIS — K2960 Other gastritis without bleeding: Secondary | ICD-10-CM | POA: Insufficient documentation

## 2015-08-07 DIAGNOSIS — Z79899 Other long term (current) drug therapy: Secondary | ICD-10-CM | POA: Diagnosis not present

## 2015-08-07 DIAGNOSIS — R1084 Generalized abdominal pain: Secondary | ICD-10-CM | POA: Insufficient documentation

## 2015-08-07 DIAGNOSIS — K2970 Gastritis, unspecified, without bleeding: Secondary | ICD-10-CM | POA: Diagnosis not present

## 2015-08-07 DIAGNOSIS — E119 Type 2 diabetes mellitus without complications: Secondary | ICD-10-CM | POA: Diagnosis not present

## 2015-08-07 DIAGNOSIS — Z7951 Long term (current) use of inhaled steroids: Secondary | ICD-10-CM | POA: Insufficient documentation

## 2015-08-07 DIAGNOSIS — Z886 Allergy status to analgesic agent status: Secondary | ICD-10-CM | POA: Insufficient documentation

## 2015-08-07 DIAGNOSIS — K644 Residual hemorrhoidal skin tags: Secondary | ICD-10-CM | POA: Insufficient documentation

## 2015-08-07 HISTORY — PX: ESOPHAGOGASTRODUODENOSCOPY (EGD) WITH PROPOFOL: SHX5813

## 2015-08-07 HISTORY — PX: COLONOSCOPY WITH PROPOFOL: SHX5780

## 2015-08-07 HISTORY — DX: Headache: R51

## 2015-08-07 HISTORY — DX: Headache, unspecified: R51.9

## 2015-08-07 LAB — GLUCOSE, CAPILLARY: Glucose-Capillary: 176 mg/dL — ABNORMAL HIGH (ref 65–99)

## 2015-08-07 SURGERY — COLONOSCOPY WITH PROPOFOL
Anesthesia: General

## 2015-08-07 MED ORDER — SODIUM CHLORIDE 0.9 % IV SOLN
INTRAVENOUS | Status: DC
  Administered 2015-08-07: 1000 mL via INTRAVENOUS

## 2015-08-07 MED ORDER — PROPOFOL 500 MG/50ML IV EMUL
INTRAVENOUS | Status: DC | PRN
  Administered 2015-08-07: 120 ug/kg/min via INTRAVENOUS

## 2015-08-07 MED ORDER — MIDAZOLAM HCL 2 MG/2ML IJ SOLN
INTRAMUSCULAR | Status: DC | PRN
  Administered 2015-08-07: 2 mg via INTRAVENOUS

## 2015-08-07 MED ORDER — PROPOFOL 10 MG/ML IV BOLUS
INTRAVENOUS | Status: DC | PRN
  Administered 2015-08-07: 40 mg via INTRAVENOUS

## 2015-08-07 MED ORDER — SODIUM CHLORIDE 0.9 % IV SOLN: INTRAVENOUS | Status: DC

## 2015-08-07 MED ORDER — FENTANYL CITRATE (PF) 100 MCG/2ML IJ SOLN
INTRAMUSCULAR | Status: DC | PRN
  Administered 2015-08-07: 2 ug via INTRAVENOUS

## 2015-08-07 NOTE — Telephone Encounter (Signed)
Left pt mess to call/SW 

## 2015-08-07 NOTE — Telephone Encounter (Signed)
-----   Message from Nori Riis, PA-C sent at 08/01/2015  8:29 AM EDT ----- Patient's thyroid levels are normal, but his lipid panel is still abnormal.  Has he seen his PCP concerning this?  We cannot administer injections until this is addressed.

## 2015-08-07 NOTE — Op Note (Signed)
[facility] Gastroenterology Patient Name: Gerald Ellis Procedure Date: 08/07/2015 10:37 AM MRN: [id number] Account #: [id number] Date of Birth: 05/22/1965 Admit Type: Outpatient Age: 50 Room: [facility] ENDO ROOM 2 Gender: Male Note Status: Finalized Procedure:            Upper GI endoscopy Indications:          Epigastric abdominal pain, Generalized abdominal pain,                        Dyspepsia Providers:            Lollie Sails, MD Referring MD:         Irven Easterly. Kary Kos, MD (Referring MD) Medicines:            Monitored Anesthesia Care Complications:        No immediate complications. Procedure:            Pre-Anesthesia Assessment:                       - ASA Grade Assessment: III - A patient with severe                        systemic disease.                       After obtaining informed consent, the endoscope was                        passed under direct vision. Throughout the procedure,                        the patient's blood pressure, pulse, and oxygen                        saturations were monitored continuously. The Endoscope                        was introduced through the mouth, and advanced to the                        third part of duodenum. The upper GI endoscopy was                        accomplished without difficulty. The patient tolerated                        the procedure well. Findings:      The Z-line was irregular. Biopsies were taken with a cold forceps for       histology.      The exam of the esophagus was otherwise normal.      Patchy mild inflammation characterized by congestion (edema) and       erythema was found in the gastric body and in the gastric antrum.       Biopsies were taken with a cold forceps for histology. Biopsies were       taken with a cold forceps for Helicobacter pylori testing.      The cardia and gastric fundus were normal on retroflexion.      Patchy mild inflammation characterized by  congestion (edema) and       erythema was found in the duodenal  bulb and in the second portion of the       duodenum. Biopsies were taken with a cold forceps for histology. Impression:           - Z-line irregular. Biopsied.                       - Bile gastritis. Biopsied.                       - Duodenitis. Biopsied. Recommendation:       - Continue present medications.                       - Do a gastric emptying study at appointment to be                        scheduled. Procedure Code(s):    --- Professional ---                       [id number], Esophagogastroduodenoscopy, flexible, transoral;                        with biopsy, single or multiple Diagnosis Code(s):    --- Professional ---                       K22.8, Other specified diseases of esophagus                       K29.60, Other gastritis without bleeding                       K29.80, Duodenitis without bleeding                       R10.13, Epigastric pain                       R10.84, Generalized abdominal pain CPT copyright 2016 [employer]. All rights reserved. The codes documented in this report are preliminary and upon coder review may  be revised to meet current compliance requirements. Lollie Sails, MD 08/07/2015 11:04:17 AM This report has been signed electronically. Number of Addenda: 0 Note Initiated On: 08/07/2015 10:37 AM      [facility]

## 2015-08-07 NOTE — Anesthesia Postprocedure Evaluation (Signed)
Anesthesia Post Note  Patient: Gerald Ellis  Procedure(s) Performed: Procedure(s) (LRB): COLONOSCOPY WITH PROPOFOL (N/A) ESOPHAGOGASTRODUODENOSCOPY (EGD) WITH PROPOFOL (N/A)  Patient location during evaluation: PACU Anesthesia Type: General Level of consciousness: awake Pain management: pain level controlled Vital Signs Assessment: post-procedure vital signs reviewed and stable Respiratory status: spontaneous breathing Cardiovascular status: stable Anesthetic complications: no    Last Vitals:  Filed Vitals:   08/07/15 1019  BP: 161/94  Pulse: 93  Temp: 37.1 C  Resp: 18    Last Pain: There were no vitals filed for this visit.               VAN STAVEREN,Lotoya Casella

## 2015-08-07 NOTE — Op Note (Signed)
[facility] Gastroenterology Patient Name: Gerald Ellis Procedure Date: 08/07/2015 10:37 AM MRN: [id number] Account #: [id number] Date of Birth: 04/16/1966 Admit Type: Outpatient Age: 50 Room: [facility] ENDO ROOM 2 Gender: Male Note Status: Finalized Procedure:            Colonoscopy Indications:          Generalized abdominal pain Providers:            Lollie Sails, MD Referring MD:         Irven Easterly. Kary Kos, MD (Referring MD) Medicines:            Monitored Anesthesia Care Complications:        No immediate complications. Procedure:            Pre-Anesthesia Assessment:                       - ASA Grade Assessment: III - A patient with severe                        systemic disease.                       After obtaining informed consent, the colonoscope was                        passed under direct vision. Throughout the procedure,                        the patient's blood pressure, pulse, and oxygen                        saturations were monitored continuously. The                        Colonoscope was introduced through the anus with the                        intention of advancing to the cecum. The scope was                        advanced to the sigmoid colon before the procedure was                        aborted. Medications were given. The colonoscopy was                        unusually difficult due to poor bowel prep with stool                        present. The patient tolerated the procedure well. Findings:      A moderate amount of semi-solid solid stool was found in the rectum and       in the sigmoid colon, precluding visualization.      The digital rectal exam was normal.      The digital rectal exam findings include thrombosed external hemorrhoids. Impression:           - Stool in the rectum and in the sigmoid colon.                       - Thrombosed external hemorrhoids  found on digital                        rectal exam.              - No specimens collected. Recommendation:       - Make the patient NPO starting today. Procedure Code(s):    --- Professional ---                       [id number], 53, Colonoscopy, flexible; diagnostic, including                        collection of specimen(s) by brushing or washing, when                        performed (separate procedure) Diagnosis Code(s):    --- Professional ---                       K64.5, Perianal venous thrombosis                       R10.84, Generalized abdominal pain CPT copyright 2016 American Medical Association. All rights reserved. The codes documented in this report are preliminary and upon coder review may  be revised to meet current compliance requirements. Lollie Sails, MD 08/07/2015 11:11:12 AM This report has been signed electronically. Number of Addenda: 0 Note Initiated On: 08/07/2015 10:37 AM Total Procedure Duration: 0 hours 1 minute 15 seconds       [facility]

## 2015-08-07 NOTE — Transfer of Care (Signed)
Immediate Anesthesia Transfer of Care Note  Patient: Gerald Ellis  Procedure(s) Performed: Procedure(s): COLONOSCOPY WITH PROPOFOL (N/A) ESOPHAGOGASTRODUODENOSCOPY (EGD) WITH PROPOFOL (N/A)  Patient Location: PACU  Anesthesia Type:General  Level of Consciousness: awake and alert   Airway & Oxygen Therapy: Patient Spontanous Breathing and Patient connected to nasal cannula oxygen  Post-op Assessment: Report given to RN and Post -op Vital signs reviewed and stable  Post vital signs: Reviewed and stable  Last Vitals:  Filed Vitals:   08/07/15 1019  BP: 161/94  Pulse: 93  Temp: 37.1 C  Resp: 18    Complications: No apparent anesthesia complications

## 2015-08-07 NOTE — H&P (Signed)
Outpatient short stay form Pre-procedure 08/07/2015 10:37 AM Gerald Sails MD  Primary Physician: Maryland Pink, M.D.  Reason for visit:  EGD and colonoscopy  History of present illness:  Patient is a 50 year old male presenting today for as above. He is been having issues with generalized abdominal pain although mostly in the epigastric region. He has been taking Protonix once a day. He denies use of any blood thinning products or aspirin in the past week. He does however take Alka-Seltzer's on regular basis otherwise. There is remote history of alcohol abuse. Currently only drinks on occasion. Platelet count on 06/16/2015 was 268.    Current facility-administered medications:  .  0.9 %  sodium chloride infusion, , Intravenous, Continuous, Gerald Sails, MD, Last Rate: 20 mL/hr at 08/07/15 1029, 1,000 mL at 08/07/15 1029 .  0.9 %  sodium chloride infusion, , Intravenous, Continuous, Gerald Sails, MD  Prescriptions prior to admission  Medication Sig Dispense Refill Last Dose  . albuterol (PROAIR HFA) 108 (90 BASE) MCG/ACT inhaler Inhale into the lungs.   Taking  . cyclobenzaprine (FLEXERIL) 10 MG tablet Take 10 mg by mouth at bedtime.   Taking  . DHA-EPA-VITAMIN E PO Take 1 tablet by mouth daily.   Taking  . diclofenac sodium (VOLTAREN) 1 % GEL Apply 2-4 g topically 4 (four) times daily. Reported on 06/29/2015   Taking  . fluticasone (FLONASE) 50 MCG/ACT nasal spray Place 1 spray into both nostrils daily.   Taking  . gabapentin (NEURONTIN) 300 MG capsule Take 300 mg by mouth at bedtime. 2 capsules at bedtime   Taking  . glipiZIDE (GLUCOTROL) 10 MG tablet Take 10 mg by mouth daily before breakfast.   Taking  . Glucose Blood (COOL BLOOD GLUCOSE TEST STRIPS VI)    Taking  . hydrochlorothiazide (HYDRODIURIL) 12.5 MG tablet Take by mouth.   Taking  . HYDROcodone-acetaminophen (NORCO/VICODIN) 5-325 MG tablet Limit 1 tab by mouth per day or twice a day if tolerated 50 tablet 0 Taking   . insulin detemir (LEVEMIR) 100 UNIT/ML injection Inject 28 Units into the skin every morning. Reported on 06/29/2015   Taking  . insulin lispro (HUMALOG) 100 UNIT/ML injection Inject 15 Units into the skin 3 (three) times daily with meals.    Taking  . insulin regular (NOVOLIN R,HUMULIN R) 100 units/mL injection Reported on 05/03/2015   Taking  . Lancets MISC    Taking  . lisinopril (PRINIVIL,ZESTRIL) 20 MG tablet Take 20 mg by mouth 2 (two) times daily.   Taking  . metFORMIN (GLUCOPHAGE) 1000 MG tablet Take 1,000 mg by mouth 2 (two) times daily with a meal.   Taking  . Multiple Vitamins-Minerals (MULTIVITAMIN WITH MINERALS) tablet Take 1 tablet by mouth daily.   Taking  . ondansetron (ZOFRAN) 4 MG tablet Take 1 tablet (4 mg total) by mouth daily as needed for nausea or vomiting. 20 tablet 1 Taking  . pantoprazole (PROTONIX) 40 MG tablet TAKE 1 TABLET BY MOUTH EVERY DAY   Taking  . pioglitazone (ACTOS) 45 MG tablet Take 45 mg by mouth daily.   Taking  . simvastatin (ZOCOR) 40 MG tablet Take 40 mg by mouth daily.   Taking  . testosterone cypionate (DEPOTESTOTERONE CYPIONATE) 200 MG/ML injection Inject 200 mg into the muscle every 21 ( twenty-one) days. Reported on 06/29/2015   Taking  . traMADol (ULTRAM) 50 MG tablet Limit 1 tab by mouth a day or twice a day if tolerated 40 tablet 0 Taking  Allergies  Allergen Reactions  . Ibuprofen Shortness Of Breath  . Nsaids Swelling    Swelling of throat     Past Medical History  Diagnosis Date  . Hypercholesteremia   . Hypertension   . Diabetes mellitus without complication ([place])   . Hemorrhoids   . Environmental allergies   . Lumbago   . Childhood asthma   . Hypogonadism in male   . Over weight   . Benign enlargement of prostate   . Erectile dysfunction   . Bronchitis   . Headache     Review of systems:      Physical Exam    Heart and lungs: Regular rate and rhythm without rub or gallop, lungs are bilaterally clear    HEENT:  Normocephalic atraumatic eyes are anicteric    Other: Ruddy complexion, angiectasia is over the anterior malar surfaces.    Pertinant exam for procedure: Soft, generalized tenderness to palpation. No rebound. No masses noted although patient has a protuberant abdomen. I'll sounds are positive    Planned proceedures: EGD and colonoscopy with indicated procedures. Outpatient short stay form Pre-procedure 08/07/2015 10:40 AM Gerald Sails MD

## 2015-08-07 NOTE — Anesthesia Preprocedure Evaluation (Signed)
Anesthesia Evaluation  Patient identified by MRN, date of birth, ID band Patient awake    Reviewed: Allergy & Precautions, NPO status , Patient's Chart, lab work & pertinent test results  Airway Mallampati: III       Dental  (+) Poor Dentition   Pulmonary asthma ,     + decreased breath sounds      Cardiovascular Exercise Tolerance: Poor hypertension, Pt. on medications  Rhythm:Regular     Neuro/Psych    GI/Hepatic negative GI ROS, Neg liver ROS,   Endo/Other  diabetes, Well Controlled, Type 1, Insulin DependentMorbid obesity  Renal/GU negative Renal ROS     Musculoskeletal   Abdominal (+) + obese,   Peds  Hematology negative hematology ROS (+)   Anesthesia Other Findings   Reproductive/Obstetrics                             Anesthesia Physical Anesthesia Plan  ASA: III  Anesthesia Plan: General   Post-op Pain Management:    Induction: Intravenous  Airway Management Planned: Natural Airway and Nasal Cannula  Additional Equipment:   Intra-op Plan:   Post-operative Plan:   Informed Consent: I have reviewed the patients History and Physical, chart, labs and discussed the procedure including the risks, benefits and alternatives for the proposed anesthesia with the patient or authorized representative who has indicated his/her understanding and acceptance.     Plan Discussed with: CRNA  Anesthesia Plan Comments:         Anesthesia Quick Evaluation

## 2015-08-08 ENCOUNTER — Encounter
Admission: RE | Disposition: A | Discharge status: Home / Self Care | Payer: Self-pay | Source: Ambulatory Visit | Attending: Gastroenterology

## 2015-08-08 ENCOUNTER — Ambulatory Visit
Admission: RE | Admit: 2015-08-08 | Discharge: 2015-08-08 | Disposition: A | Discharge status: Home / Self Care | Payer: Medicare Other | Source: Ambulatory Visit | Attending: Gastroenterology | Admitting: Gastroenterology

## 2015-08-08 ENCOUNTER — Ambulatory Visit: Payer: Medicare Other | Admitting: Anesthesiology

## 2015-08-08 ENCOUNTER — Encounter: Payer: Self-pay | Admitting: *Deleted

## 2015-08-08 DIAGNOSIS — I10 Essential (primary) hypertension: Secondary | ICD-10-CM | POA: Diagnosis not present

## 2015-08-08 DIAGNOSIS — D122 Benign neoplasm of ascending colon: Secondary | ICD-10-CM | POA: Insufficient documentation

## 2015-08-08 DIAGNOSIS — D123 Benign neoplasm of transverse colon: Secondary | ICD-10-CM | POA: Diagnosis not present

## 2015-08-08 DIAGNOSIS — Z794 Long term (current) use of insulin: Secondary | ICD-10-CM | POA: Diagnosis not present

## 2015-08-08 DIAGNOSIS — K5730 Diverticulosis of large intestine without perforation or abscess without bleeding: Secondary | ICD-10-CM | POA: Diagnosis not present

## 2015-08-08 DIAGNOSIS — E7800 Pure hypercholesterolemia, unspecified: Secondary | ICD-10-CM | POA: Insufficient documentation

## 2015-08-08 DIAGNOSIS — Z886 Allergy status to analgesic agent status: Secondary | ICD-10-CM | POA: Diagnosis not present

## 2015-08-08 DIAGNOSIS — D125 Benign neoplasm of sigmoid colon: Secondary | ICD-10-CM | POA: Insufficient documentation

## 2015-08-08 DIAGNOSIS — J45909 Unspecified asthma, uncomplicated: Secondary | ICD-10-CM | POA: Insufficient documentation

## 2015-08-08 DIAGNOSIS — Z79899 Other long term (current) drug therapy: Secondary | ICD-10-CM | POA: Diagnosis not present

## 2015-08-08 DIAGNOSIS — R1084 Generalized abdominal pain: Secondary | ICD-10-CM | POA: Diagnosis present

## 2015-08-08 DIAGNOSIS — E109 Type 1 diabetes mellitus without complications: Secondary | ICD-10-CM | POA: Insufficient documentation

## 2015-08-08 HISTORY — PX: COLONOSCOPY WITH PROPOFOL: SHX5780

## 2015-08-08 LAB — GLUCOSE, CAPILLARY: GLUCOSE-CAPILLARY: 159 mg/dL — AB (ref 65–99)

## 2015-08-08 LAB — SURGICAL PATHOLOGY

## 2015-08-08 SURGERY — COLONOSCOPY WITH PROPOFOL
Anesthesia: General

## 2015-08-08 MED ORDER — SODIUM CHLORIDE 0.9 % IV SOLN
INTRAVENOUS | Status: DC
  Administered 2015-08-08: 16:00:00 via INTRAVENOUS
  Administered 2015-08-08: 1000 mL via INTRAVENOUS

## 2015-08-08 MED ORDER — SODIUM CHLORIDE 0.9 % IV SOLN: INTRAVENOUS | Status: DC

## 2015-08-08 MED ORDER — FENTANYL CITRATE (PF) 100 MCG/2ML IJ SOLN
INTRAMUSCULAR | Status: DC | PRN
  Administered 2015-08-08: 50 ug via INTRAVENOUS
  Administered 2015-08-08 (×2): 25 ug via INTRAVENOUS

## 2015-08-08 MED ORDER — PHENYLEPHRINE HCL 10 MG/ML IJ SOLN
INTRAMUSCULAR | Status: DC | PRN
  Administered 2015-08-08 (×4): 100 ug via INTRAVENOUS

## 2015-08-08 MED ORDER — PROPOFOL 500 MG/50ML IV EMUL
INTRAVENOUS | Status: DC | PRN
  Administered 2015-08-08: 160 ug/kg/min via INTRAVENOUS

## 2015-08-08 MED ORDER — PROPOFOL 10 MG/ML IV BOLUS
INTRAVENOUS | Status: DC | PRN
  Administered 2015-08-08: 30 mg via INTRAVENOUS

## 2015-08-08 MED ORDER — MIDAZOLAM HCL 2 MG/2ML IJ SOLN
INTRAMUSCULAR | Status: DC | PRN
  Administered 2015-08-08: 1 mg via INTRAVENOUS

## 2015-08-08 NOTE — Anesthesia Preprocedure Evaluation (Signed)
Anesthesia Evaluation  Patient identified by MRN, date of birth, ID band Patient awake    Reviewed: Allergy & Precautions, NPO status , Patient's Chart, lab work & pertinent test results  Airway Mallampati: III       Dental  (+) Poor Dentition   Pulmonary asthma ,     + decreased breath sounds      Cardiovascular Exercise Tolerance: Poor hypertension, Pt. on medications  Rhythm:Regular     Neuro/Psych    GI/Hepatic negative GI ROS, Neg liver ROS,   Endo/Other  diabetes, Well Controlled, Type 1, Insulin DependentMorbid obesity  Renal/GU negative Renal ROS     Musculoskeletal   Abdominal (+) + obese,   Peds  Hematology negative hematology ROS (+)   Anesthesia Other Findings   Reproductive/Obstetrics                             Anesthesia Physical  Anesthesia Plan  ASA: III  Anesthesia Plan: General   Post-op Pain Management:    Induction: Intravenous  Airway Management Planned: Natural Airway and Nasal Cannula  Additional Equipment:   Intra-op Plan:   Post-operative Plan:   Informed Consent: I have reviewed the patients History and Physical, chart, labs and discussed the procedure including the risks, benefits and alternatives for the proposed anesthesia with the patient or authorized representative who has indicated his/her understanding and acceptance.     Plan Discussed with: CRNA  Anesthesia Plan Comments:         Anesthesia Quick Evaluation

## 2015-08-08 NOTE — Transfer of Care (Signed)
Immediate Anesthesia Transfer of Care Note  Patient: Gerald Ellis  Procedure(s) Performed: Procedure(s): COLONOSCOPY WITH PROPOFOL (N/A)  Patient Location: PACU  Anesthesia Type:General  Level of Consciousness: sedated  Airway & Oxygen Therapy: Patient Spontanous Breathing and Patient connected to nasal cannula oxygen  Post-op Assessment: Report given to RN and Post -op Vital signs reviewed and stable  Post vital signs: Reviewed  Last Vitals:  Filed Vitals:   08/08/15 1526 08/08/15 1710  BP: 145/91 133/84  Pulse: 94 88  Temp: 37.1 C 36 C  Resp: 20 14    Complications: No apparent anesthesia complications

## 2015-08-08 NOTE — H&P (Signed)
Outpatient short stay form Pre-procedure 08/08/2015 3:37 PM Gerald Sails MD  Primary Physician: Dr. Maryland Pink  Reason for visit:  Colonoscopy  History of present illness:  Patient is a 50 year old male presenting today for colonoscopy. He actually came in yesterday for EGD as well as colonoscopy however the lower scope could not be done due to poor prep. He was reprepped overnight is present and is presenting today for the repeat colonoscopy. There is some remote history of alcohol use. Currently he only drinks on occasion. His last platelet count was 06/16/2015 at 268.    Current facility-administered medications:  .  0.9 %  sodium chloride infusion, , Intravenous, Continuous, Gerald Sails, MD, Last Rate: 20 mL/hr at 08/08/15 1525, 1,000 mL at 08/08/15 1525 .  0.9 %  sodium chloride infusion, , Intravenous, Continuous, Gerald Sails, MD  Prescriptions prior to admission  Medication Sig Dispense Refill Last Dose  . albuterol (PROAIR HFA) 108 (90 BASE) MCG/ACT inhaler Inhale into the lungs.   Taking  . cyclobenzaprine (FLEXERIL) 10 MG tablet Take 10 mg by mouth at bedtime.   Taking  . DHA-EPA-VITAMIN E PO Take 1 tablet by mouth daily.   Taking  . diclofenac sodium (VOLTAREN) 1 % GEL Apply 2-4 g topically 4 (four) times daily. Reported on 06/29/2015   Taking  . fluticasone (FLONASE) 50 MCG/ACT nasal spray Place 1 spray into both nostrils daily.   Taking  . gabapentin (NEURONTIN) 300 MG capsule Take 300 mg by mouth at bedtime. 2 capsules at bedtime   Taking  . glipiZIDE (GLUCOTROL) 10 MG tablet Take 10 mg by mouth daily before breakfast.   Taking  . Glucose Blood (COOL BLOOD GLUCOSE TEST STRIPS VI)    Taking  . hydrochlorothiazide (HYDRODIURIL) 12.5 MG tablet Take by mouth.   Taking  . HYDROcodone-acetaminophen (NORCO/VICODIN) 5-325 MG tablet Limit 1 tab by mouth per day or twice a day if tolerated 50 tablet 0 Taking  . insulin detemir (LEVEMIR) 100 UNIT/ML injection Inject 28  Units into the skin every morning. Reported on 06/29/2015   Taking  . insulin lispro (HUMALOG) 100 UNIT/ML injection Inject 15 Units into the skin 3 (three) times daily with meals.    Taking  . insulin regular (NOVOLIN R,HUMULIN R) 100 units/mL injection Reported on 05/03/2015   Taking  . Lancets MISC    Taking  . lisinopril (PRINIVIL,ZESTRIL) 20 MG tablet Take 20 mg by mouth 2 (two) times daily.   Taking  . metFORMIN (GLUCOPHAGE) 1000 MG tablet Take 1,000 mg by mouth 2 (two) times daily with a meal.   Taking  . Multiple Vitamins-Minerals (MULTIVITAMIN WITH MINERALS) tablet Take 1 tablet by mouth daily.   Taking  . ondansetron (ZOFRAN) 4 MG tablet Take 1 tablet (4 mg total) by mouth daily as needed for nausea or vomiting. 20 tablet 1 Taking  . pantoprazole (PROTONIX) 40 MG tablet TAKE 1 TABLET BY MOUTH EVERY DAY   Taking  . pioglitazone (ACTOS) 45 MG tablet Take 45 mg by mouth daily.   Taking  . simvastatin (ZOCOR) 40 MG tablet Take 40 mg by mouth daily.   Taking  . testosterone cypionate (DEPOTESTOTERONE CYPIONATE) 200 MG/ML injection Inject 200 mg into the muscle every 21 ( twenty-one) days. Reported on 06/29/2015   Taking  . traMADol (ULTRAM) 50 MG tablet Limit 1 tab by mouth a day or twice a day if tolerated 40 tablet 0 Taking     Allergies  Allergen  Reactions  . Ibuprofen Shortness Of Breath  . Nsaids Swelling    Swelling of throat     Past Medical History  Diagnosis Date  . Hypercholesteremia   . Hypertension   . Diabetes mellitus without complication ([place])   . Hemorrhoids   . Environmental allergies   . Lumbago   . Childhood asthma   . Hypogonadism in male   . Over weight   . Benign enlargement of prostate   . Erectile dysfunction   . Bronchitis   . Headache     Review of systems:      Physical Exam    Heart and lungs: Regular rate and rhythm without rub or gallop, lungs are bilaterally clear.    HEENT: Normocephalic atraumatic eyes are anicteric    Other:      Pertinant exam for procedure: Soft mildly protuberant bowel sounds are positive there is no rebound however he does have generalized abdominal tenderness. Is is more so in the center of the abdomen.    Planned proceedures: Colonoscopy and indicated procedures. I have discussed the risks benefits and complications of procedures to include not limited to bleeding, infection, perforation and the risk of sedation and the patient wishes to proceed.    Gerald Sails, MD Gastroenterology 08/08/2015  3:37 PM

## 2015-08-08 NOTE — Op Note (Signed)
[facility] Gastroenterology Patient Name: Gerald Ellis Procedure Date: 08/08/2015 4:08 PM MRN: [id number] Account #: [id number] Date of Birth: 1965/06/29 Admit Type: Outpatient Age: 50 Room: [facility] ENDO ROOM 3 Gender: Male Note Status: Finalized Procedure:            Colonoscopy Indications:          Generalized abdominal pain Providers:            Lollie Sails, MD Referring MD:         Irven Easterly. Kary Kos, MD (Referring MD) Medicines:            Monitored Anesthesia Care Complications:        No immediate complications. Procedure:            Pre-Anesthesia Assessment:                       - ASA Grade Assessment: III - A patient with severe                        systemic disease.                       After obtaining informed consent, the colonoscope was                        passed under direct vision. Throughout the procedure,                        the patient's blood pressure, pulse, and oxygen                        saturations were monitored continuously. The                        Colonoscope was introduced through the anus and                        advanced to the the cecum, identified by appendiceal                        orifice and ileocecal valve. The colonoscopy was                        performed with moderate difficulty. The patient                        tolerated the procedure well. Findings:      Multiple small and large-mouthed diverticula were found in the sigmoid       colon, descending colon and transverse colon.      A 6 mm polyp was found in the distal descending colon. The polyp was       sessile. The polyp was removed with a hot snare. Resection and retrieval       were complete.      Two sessile polyps were found in the proximal ascending colon. The       polyps were 4 to 7 mm in size. These polyps were removed with a hot       snare. Resection and retrieval were complete.      A 4 mm polyp was found in the distal ascending  colon. The polyp was  flat. The polyp was removed with a cold snare. Resection and retrieval       were complete.      A 15 mm polyp was found in the proximal sigmoid colon. The polyp was       sessile. The polyp was removed with a hot snare. Resection and retrieval       were complete. To prevent bleeding after the polypectomy, one hemostatic       clip was successfully placed. There was no bleeding at the end of the       maneuver.      A 16 mm polyp was found in the mid sigmoid colon. The polyp was       pedunculated. The polyp was removed with a hot snare. Resection and       retrieval were complete. To prevent bleeding after the polypectomy, one       hemostatic clip was successfully placed. There was no bleeding at the       end of the maneuver.      The retroflexed view of the distal rectum and anal verge was normal and       showed no anal or rectal abnormalities.      The digital rectal exam was normal. Impression:           - Diverticulosis in the sigmoid colon, in the                        descending colon and in the transverse colon.                       - One 6 mm polyp in the distal descending colon,                        removed with a hot snare. Resected and retrieved.                       - Two 4 to 7 mm polyps in the proximal ascending colon,                        removed with a hot snare. Resected and retrieved.                       - One 4 mm polyp in the distal ascending colon, removed                        with a cold snare. Resected and retrieved.                       - One 15 mm polyp in the proximal sigmoid colon,                        removed with a hot snare. Resected and retrieved. Clip                        was placed.                       - One 16 mm polyp in the mid sigmoid colon, removed  with a hot snare. Resected and retrieved. Clip was                        placed.                       - The distal rectum and  anal verge are normal on                        retroflexion view. Recommendation:       - Discharge patient to home.                       - Full liquid diet for 2 days, then advance as                        tolerated to soft diet for 1 week.                       - consider ct abd for continued symptoms. Procedure Code(s):    --- Professional ---                       [phone], Colonoscopy, flexible; with removal of tumor(s),                        polyp(s), or other lesion(s) by snare technique Diagnosis Code(s):    --- Professional ---                       D12.4, Benign neoplasm of descending colon                       D12.2, Benign neoplasm of ascending colon                       D12.5, Benign neoplasm of sigmoid colon                       R10.84, Generalized abdominal pain                       K57.30, Diverticulosis of large intestine without                        perforation or abscess without bleeding CPT copyright 2016 American Medical Association. All rights reserved. The codes documented in this report are preliminary and upon coder review may  be revised to meet current compliance requirements. Lollie Sails, MD 08/08/2015 5:12:49 PM This report has been signed electronically. Number of Addenda: 0 Note Initiated On: 08/08/2015 4:08 PM Scope Withdrawal Time: 0 hours 28 minutes 16 seconds  Total Procedure Duration: 0 hours 39 minutes 4 seconds       [facility]

## 2015-08-08 NOTE — Anesthesia Postprocedure Evaluation (Signed)
Anesthesia Post Note  Patient: Gerald Ellis  Procedure(s) Performed: Procedure(s) (LRB): COLONOSCOPY WITH PROPOFOL (N/A)  Patient location during evaluation: Endoscopy Anesthesia Type: General Level of consciousness: awake and alert Pain management: pain level controlled Vital Signs Assessment: post-procedure vital signs reviewed and stable Respiratory status: spontaneous breathing, nonlabored ventilation, respiratory function stable and patient connected to nasal cannula oxygen Cardiovascular status: blood pressure returned to baseline and stable Postop Assessment: no signs of nausea or vomiting Anesthetic complications: no    Last Vitals:  Filed Vitals:   08/08/15 1730 08/08/15 1740  BP: 135/85 132/95  Pulse: 86 81  Temp:    Resp: 18 20    Last Pain: There were no vitals filed for this visit.               Martha Clan

## 2015-08-09 ENCOUNTER — Encounter: Payer: Self-pay | Admitting: Gastroenterology

## 2015-08-10 LAB — SURGICAL PATHOLOGY

## 2015-08-10 NOTE — Telephone Encounter (Signed)
Patient notified/SW 

## 2015-08-11 ENCOUNTER — Other Ambulatory Visit: Payer: Self-pay | Admitting: Pain Medicine

## 2015-08-17 ENCOUNTER — Ambulatory Visit
Admission: RE | Admit: 2015-08-17 | Discharge: 2015-08-17 | Disposition: A | Discharge status: Home / Self Care | Payer: Medicare Other | Source: Ambulatory Visit | Attending: Gastroenterology | Admitting: Gastroenterology

## 2015-08-17 DIAGNOSIS — R1084 Generalized abdominal pain: Secondary | ICD-10-CM | POA: Diagnosis not present

## 2015-08-17 MED ORDER — TECHNETIUM TC 99M SULFUR COLLOID
2.0000 | Freq: Once | INTRAVENOUS | Status: AC | PRN
  Administered 2015-08-17: 2.16 via INTRAVENOUS

## 2015-08-23 ENCOUNTER — Ambulatory Visit: Payer: Medicare Other | Attending: Pain Medicine | Admitting: Pain Medicine

## 2015-08-23 ENCOUNTER — Encounter: Payer: Self-pay | Admitting: Pain Medicine

## 2015-08-23 VITALS — BP 143/90 | HR 102 | Temp 98.5°F | Resp 16 | Ht 70.0 in | Wt 244.0 lb

## 2015-08-23 DIAGNOSIS — R51 Headache: Secondary | ICD-10-CM | POA: Insufficient documentation

## 2015-08-23 DIAGNOSIS — M19019 Primary osteoarthritis, unspecified shoulder: Secondary | ICD-10-CM | POA: Diagnosis not present

## 2015-08-23 DIAGNOSIS — M5126 Other intervertebral disc displacement, lumbar region: Secondary | ICD-10-CM | POA: Diagnosis not present

## 2015-08-23 DIAGNOSIS — M4806 Spinal stenosis, lumbar region: Secondary | ICD-10-CM | POA: Insufficient documentation

## 2015-08-23 DIAGNOSIS — X58XXXA Exposure to other specified factors, initial encounter: Secondary | ICD-10-CM | POA: Diagnosis not present

## 2015-08-23 DIAGNOSIS — S46919A Strain of unspecified muscle, fascia and tendon at shoulder and upper arm level, unspecified arm, initial encounter: Secondary | ICD-10-CM | POA: Diagnosis not present

## 2015-08-23 DIAGNOSIS — Z9889 Other specified postprocedural states: Secondary | ICD-10-CM

## 2015-08-23 DIAGNOSIS — M19011 Primary osteoarthritis, right shoulder: Secondary | ICD-10-CM

## 2015-08-23 DIAGNOSIS — M5416 Radiculopathy, lumbar region: Secondary | ICD-10-CM

## 2015-08-23 DIAGNOSIS — M5030 Other cervical disc degeneration, unspecified cervical region: Secondary | ICD-10-CM

## 2015-08-23 DIAGNOSIS — M5136 Other intervertebral disc degeneration, lumbar region: Secondary | ICD-10-CM | POA: Insufficient documentation

## 2015-08-23 DIAGNOSIS — F329 Major depressive disorder, single episode, unspecified: Secondary | ICD-10-CM | POA: Insufficient documentation

## 2015-08-23 DIAGNOSIS — M5481 Occipital neuralgia: Secondary | ICD-10-CM | POA: Insufficient documentation

## 2015-08-23 DIAGNOSIS — F419 Anxiety disorder, unspecified: Secondary | ICD-10-CM | POA: Insufficient documentation

## 2015-08-23 DIAGNOSIS — M542 Cervicalgia: Secondary | ICD-10-CM | POA: Diagnosis present

## 2015-08-23 DIAGNOSIS — M545 Low back pain: Secondary | ICD-10-CM | POA: Diagnosis present

## 2015-08-23 DIAGNOSIS — M19012 Primary osteoarthritis, left shoulder: Secondary | ICD-10-CM

## 2015-08-23 MED ORDER — HYDROCODONE-ACETAMINOPHEN 5-325 MG PO TABS: ORAL_TABLET | ORAL | Status: DC

## 2015-08-23 MED ORDER — TRAMADOL HCL 50 MG PO TABS: ORAL_TABLET | ORAL | Status: DC

## 2015-08-23 NOTE — Patient Instructions (Signed)
PLAN  Continue present medication tramadol and hydrocodone acetaminophen  F/U PCP Dr. Kary Kos for evaliation of  BP and general medical  condition as discussed  F/U surgical evaluation. as discussed  Psych evaluation as discussed. Need to proceed with psych appointment as previously discussed   F/U urologist as previously discussed  F/U neurological evaluation. May consider PNCV/EMG studies and other studies pending follow-up evaluations  May consider radiofrequency rhizolysis or intraspinal procedures pending response to present treatment and F/U evaluation We will avoid such procedures at this time  Patient to call [facility] should patient have concerns prior to scheduled return appointment.

## 2015-08-23 NOTE — Progress Notes (Signed)
Subjective:    Patient ID: Gerald Ellis, male    DOB: 1965/11/15, 50 y.o.   MRN: [id number]  HPI   The patient is a 50 year old gentleman who returns to pain management for further evaluation and treatment of pain involving neck headaches shoulders lower back lower extremity region. The patient is undergone surgeries of the lumbar region and has been and involved with decision regarding need for additional surgery versus no need for additional surgeries of the lumbar region. The patient is with headaches in pain involving the neck shoulders as well. The patient states that recently he was informed that he had oblique in his stomach. He will undergo follow-up gastroenterological evaluations at this time. We will also discuss need for patient undergo psych evaluation and we'll continue medications as prescribed at this time. All agreed to suggested treatment plan.   Review of Systems     Objective:   Physical Exam   There was tenderness to palpation over the paraspinal muscular region cervical region cervical facet region palpation which be produced pain of mild-to-moderate degree with moderate tenderness over the cervical facet cervical paraspinal muscular region. Palpation of the acromioclavicular and glenohumeral joint regions were attends to palpation of moderate degree to moderately severe degree with limited range of motion of the shoulder. The patient was with slightly decreased grip strength and Tinel and Phalen's maneuver were without increase of pain of significant degree. Palpation over the thoracic facet thoracic paraspinal musculature region was attends to palpation of moderate degree moderate muscle spasms involving the cervical thoracic and lumbar regions. Palpation over the lumbar paraspinal must reason lumbar facet region was with moderate tends to palpation with lateral bending rotation extension and palpation of the lumbar facets reproducing moderately severe discomfort to  severe tenderness over the region of the lumbar paraspinal must reason on the left and right with palpation over the PSIS and PII S region reproducing moderate discomfort as well. Patient was straight leg raising tolerate approximately 20 without a definite increase of pain with dorsiflexion noted. Patient with moderate difficulty attending to stand on tiptoes and heels. No definite sensory deficit or dermatomal distribution detected. Knees were tennis to palpation with negative anterior and posterior drawer signs. There was negative clonus negative Homans. Abdomen with tenderness to palpation without definite rebound tenderness and without fluid wave or shifting dullness. 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  Degenerative joint disease of shoulder Mildly displaced SLAP tear that extends into the biceps anchor with fluid signal intensity between these's of arachnoid deltoid bursa and the glenohumeral joint between the supraspinatus and infraspinatus tendons suspicious for a posterior supraspinatus full-thickness tear, partial tear and was also insertional partial with tearing of the supraspinatus and infraspinatus along the articular surface with moderate supraspinatus and if spinatus subscapularis tendinopathy  Bilateral greater occipital neuralgia  Depression/anxiety  Gastroenterological disorder     PLAN  Continue present medication tramadol and hydrocodone acetaminophen  F/U PCP Dr. Kary Kos for evaliation of  BP and general medical  condition as discussed  F/U  gastroenterological evaluation and treatment as planned  F/U surgical evaluation. as discussed  Psych evaluation  as discussed. Need to proceed with psych appointment as previously discussed   F/U urologist as previously discussed  F/U neurological evaluation. May consider PNCV/EMG studies and other studies pending follow-up evaluations  May consider radiofrequency rhizolysis or intraspinal procedures pending response to present treatment and F/U evaluation We will avoid such procedures at this time  Patient to call [facility] should patient have concerns prior to scheduled return appointment.

## 2015-09-21 ENCOUNTER — Encounter: Payer: Self-pay | Admitting: Pain Medicine

## 2015-09-21 ENCOUNTER — Ambulatory Visit: Payer: Medicare Other | Attending: Pain Medicine | Admitting: Pain Medicine

## 2015-09-21 VITALS — BP 142/91 | HR 110 | Temp 98.1°F | Resp 16 | Ht 69.0 in | Wt 245.0 lb

## 2015-09-21 DIAGNOSIS — M5126 Other intervertebral disc displacement, lumbar region: Secondary | ICD-10-CM | POA: Insufficient documentation

## 2015-09-21 DIAGNOSIS — M75100 Unspecified rotator cuff tear or rupture of unspecified shoulder, not specified as traumatic: Secondary | ICD-10-CM | POA: Insufficient documentation

## 2015-09-21 DIAGNOSIS — Z9889 Other specified postprocedural states: Secondary | ICD-10-CM

## 2015-09-21 DIAGNOSIS — M5136 Other intervertebral disc degeneration, lumbar region: Secondary | ICD-10-CM | POA: Diagnosis not present

## 2015-09-21 DIAGNOSIS — M25512 Pain in left shoulder: Secondary | ICD-10-CM | POA: Diagnosis present

## 2015-09-21 DIAGNOSIS — M4806 Spinal stenosis, lumbar region: Secondary | ICD-10-CM | POA: Insufficient documentation

## 2015-09-21 DIAGNOSIS — F419 Anxiety disorder, unspecified: Secondary | ICD-10-CM | POA: Insufficient documentation

## 2015-09-21 DIAGNOSIS — M19012 Primary osteoarthritis, left shoulder: Secondary | ICD-10-CM

## 2015-09-21 DIAGNOSIS — M5030 Other cervical disc degeneration, unspecified cervical region: Secondary | ICD-10-CM

## 2015-09-21 DIAGNOSIS — M25511 Pain in right shoulder: Secondary | ICD-10-CM | POA: Diagnosis present

## 2015-09-21 DIAGNOSIS — M19019 Primary osteoarthritis, unspecified shoulder: Secondary | ICD-10-CM | POA: Diagnosis not present

## 2015-09-21 DIAGNOSIS — M5416 Radiculopathy, lumbar region: Secondary | ICD-10-CM

## 2015-09-21 DIAGNOSIS — M19011 Primary osteoarthritis, right shoulder: Secondary | ICD-10-CM

## 2015-09-21 DIAGNOSIS — M5481 Occipital neuralgia: Secondary | ICD-10-CM | POA: Insufficient documentation

## 2015-09-21 MED ORDER — HYDROCODONE-ACETAMINOPHEN 5-325 MG PO TABS: ORAL_TABLET | ORAL | Status: DC

## 2015-09-21 MED ORDER — TRAMADOL HCL 50 MG PO TABS: ORAL_TABLET | ORAL | Status: DC

## 2015-09-21 NOTE — Patient Instructions (Addendum)
PLAN  Continue present medication tramadol and hydrocodone acetaminophen  F/U PCP Dr. Kary Kos for evaliation of  BP and general medical  condition as discussed  F/U surgical evaluation. as discussed. Patient will follow up with surgeon as needed and as discussed  Psych evaluation as discussed. Need to proceed with psych appointment as previously discussed   F/U urologist as previously discussed  F/U neurological evaluation. May consider PNCV/EMG studies and other studies pending follow-up evaluations  May consider radiofrequency rhizolysis or intraspinal procedures pending response to present treatment and F/U evaluation We will avoid such procedures at this time  Patient to call [facility] should patient have concerns prior to scheduled return appointment.

## 2015-09-21 NOTE — Progress Notes (Signed)
Safety precautions to be maintained throughout the outpatient stay will include: orient to surroundings, keep bed in low position, maintain call bell within reach at all times, provide assistance with transfer out of bed and ambulation.  

## 2015-09-22 NOTE — Progress Notes (Signed)
Subjective:    Patient ID: Gerald Ellis, male    DOB: Aug 19, 1965, 50 y.o.   MRN: [id number]  HPI  The patient is a 50 year old gentleman who returns to pain management for further evaluation and treatment of pain involving the shoulders neck entire back upper and lower extremity regions. The patient is a pain fairly well-controlled present time and states that he is tolerating medications well without undesirable side effects. The patient has been considered for additional surgery of the lumbar region at the present time is without plans to proceed with surgery. The patient also has pain radiating from the neck to the back of the hip precipitating headaches. We will continue present treatment regimen and we'll consider interventional treatment should patient be with severely disabling pain despite present treatment regimen. The patient denies any recent trauma change in events of daily living the call significant change in symptomatology. All agreed to suggested treatment plan.  Review of Systems     Objective:   Physical Exam  Palpation of the paraspinal misreading cervical and cervical facet region was attends to palpation of moderate degree with moderate tenderness of the splenius capitis and occipitalis musculature regions. Palpation over the region of the cervical facet cervical paraspinal musculature region was attends to palpation of moderate degree with moderate tenderness over the thoracic facet thoracic paraspinal musculature region is well. The patient was with limited range of motion of the shoulder especially on the left and was with difficulty performing drop test. There was severe tenderness of the acromioclavicular and glenohumeral joint regions and patient appeared to be with unremarkable Spurling's maneuver. Tinel and Phalen's maneuver were without increased pain of significant degree and patient appeared to be with bilaterally equal grip strength. There was tenderness to  palpation over the thoracic paraspinal musculature region lower thoracic region of moderately severe degree with moderately severe tenderness to palpation over the lumbar paraspinal musculature region lumbar facet region. Lateral bending rotation extension and palpation of the lumbar facets reproduced moderately severe discomfort. DTRs appeared to be trace at the knees and there was negative clonus negative Homans. EHL strength was decreased. No definite sensory deficit or dermatomal distribution detected. There was tenderness to palpation of moderate degree over the PSIS and PII S region with mild tenderness of the greater trochanteric region iliotibial band region. 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  Degenerative joint disease of shoulder Mildly displaced SLAP tear that extends into the biceps anchor with fluid signal intensity between these's of arachnoid deltoid bursa and the glenohumeral joint between the supraspinatus and infraspinatus tendons suspicious for a posterior supraspinatus full-thickness tear, partial tear and was also insertional partial with tearing of the supraspinatus and infraspinatus along the articular surface with moderate supraspinatus and if spinatus subscapularis tendinopathy  Bilateral greater occipital neuralgia  Depression/anxiety  Gastroenterological disorder       PLAN  Continue present medication tramadol and hydrocodone acetaminophen  F/U PCP Dr. Kary Kos for evaliation of  BP and general medical   condition as discussed  F/U surgical evaluation. as discussed. Patient will follow up with surgeon  as needed and as discussed  Psych evaluation as discussed. Need to proceed with psych appointment as previously discussed   F/U urologist as previously discussed  F/U neurological evaluation. May consider PNCV/EMG studies and other studies pending follow-up evaluations  May consider radiofrequency rhizolysis or intraspinal procedures pending response to present treatment and F/U evaluation We will avoid such procedures at this time  Patient to call [facility] should patient have concerns prior to scheduled return appointment.

## 2015-10-23 ENCOUNTER — Encounter: Payer: Self-pay | Admitting: Pain Medicine

## 2015-10-23 ENCOUNTER — Ambulatory Visit: Payer: Medicare Other | Attending: Pain Medicine | Admitting: Pain Medicine

## 2015-10-23 VITALS — BP 165/94 | HR 85 | Temp 98.3°F | Resp 16 | Ht 70.0 in | Wt 245.0 lb

## 2015-10-23 DIAGNOSIS — M5126 Other intervertebral disc displacement, lumbar region: Secondary | ICD-10-CM | POA: Diagnosis not present

## 2015-10-23 DIAGNOSIS — M5481 Occipital neuralgia: Secondary | ICD-10-CM | POA: Diagnosis not present

## 2015-10-23 DIAGNOSIS — K319 Disease of stomach and duodenum, unspecified: Secondary | ICD-10-CM | POA: Diagnosis not present

## 2015-10-23 DIAGNOSIS — M19011 Primary osteoarthritis, right shoulder: Secondary | ICD-10-CM

## 2015-10-23 DIAGNOSIS — M19012 Primary osteoarthritis, left shoulder: Secondary | ICD-10-CM

## 2015-10-23 DIAGNOSIS — R51 Headache: Secondary | ICD-10-CM | POA: Diagnosis present

## 2015-10-23 DIAGNOSIS — M5136 Other intervertebral disc degeneration, lumbar region: Secondary | ICD-10-CM | POA: Insufficient documentation

## 2015-10-23 DIAGNOSIS — M5030 Other cervical disc degeneration, unspecified cervical region: Secondary | ICD-10-CM

## 2015-10-23 DIAGNOSIS — F419 Anxiety disorder, unspecified: Secondary | ICD-10-CM | POA: Insufficient documentation

## 2015-10-23 DIAGNOSIS — M542 Cervicalgia: Secondary | ICD-10-CM | POA: Diagnosis present

## 2015-10-23 DIAGNOSIS — M5416 Radiculopathy, lumbar region: Secondary | ICD-10-CM

## 2015-10-23 DIAGNOSIS — M4806 Spinal stenosis, lumbar region: Secondary | ICD-10-CM | POA: Insufficient documentation

## 2015-10-23 DIAGNOSIS — Z9889 Other specified postprocedural states: Secondary | ICD-10-CM

## 2015-10-23 DIAGNOSIS — M19019 Primary osteoarthritis, unspecified shoulder: Secondary | ICD-10-CM | POA: Insufficient documentation

## 2015-10-23 MED ORDER — HYDROCODONE-ACETAMINOPHEN 5-325 MG PO TABS: ORAL_TABLET | ORAL | Status: DC

## 2015-10-23 MED ORDER — TRAMADOL HCL 50 MG PO TABS: ORAL_TABLET | ORAL | Status: DC

## 2015-10-23 NOTE — Patient Instructions (Signed)
PLAN  Continue present medication tramadol and hydrocodone acetaminophen  F/U PCP Dr. Kary Kos for evaliation of  BP and general medical  condition as discussed  F/U surgical evaluation. as discussed. Patient will follow up with surgeon as needed and as discussed  Psych evaluation as discussed. Need to proceed with psych appointment as previously discussed   F/U urologist as discussed  F/U neurological evaluation. May consider PNCV/EMG studies and other studies pending follow-up evaluations  May consider radiofrequency rhizolysis or intraspinal procedures pending response to present treatment and F/U evaluation We will avoid such procedures at this time  Patient to call [facility] Center should patient have concerns prior to scheduled return appointment.

## 2015-10-23 NOTE — Progress Notes (Signed)
Subjective:    Patient ID: Gerald Ellis, male    DOB: 1965-11-27, 50 y.o.   MRN: [id number]  HPI  The patient is a 50 year old gentleman who returns to pain management for further evaluation and treatment of pain involving the neck associated with headaches with pain in the neck radiating to the back of the head and continuing to behind the eyes patient is a pain involving the shoulders entire back upper and lower extremity regions with prior surgical intervention of the lumbar region with there being discussion regarding the need for additional surgery of the lumbar region. At the present time patient pain is fairly well-controlled and we have discussed patient undergoing psych evaluation in addition to surgical and general medical follow-up evaluations. We will continue tramadol and hydrocodone acetaminophen as prescribed at this time. Patient denies any trauma change in events of daily living the call significant change in symptomatology. Patient to call pain management should they be concerns regarding condition prior to scheduled return appointment. All agreed to suggested treatment plan  Review of Systems     Objective:   Physical Exam  There was tenderness of the splenius capitis and occipitalis regions of mild to moderate degree with mild to moderate tenderness over the cervical facet cervical paraspinal musculature in the thoracic facet thoracic paraspinal musculature region. There were no bounding pulsations of the temporal region and palpation of the splenius capitis and occipitalis regions reproduce moderate discomfort. No new lesions of the head and neck were noted. There was tenderness of the acromioclavicular and glenohumeral joint region and decreased range of motion of the shoulder with patient having mild difficulty performing drop test patient was with slightly decreased grip strength and Tinel and Phalen's maneuver were without increased pain of significant degree there was  tenderness of the thoracic region thoracic facet region a moderate degree with moderate muscle spasms involving the thoracic paraspinal musculature region. Palpation over the lumbar paraspinal muscular region lumbar facet region was attends to palpation of moderate degree with lateral bending rotation extension and palpation of the lumbar facets reproducing moderate discomfort. Straight leg raising was tolerates approximately 20 with questionably increased pain with dorsiflexion noted. There was questionably decreased sensation of the L5 dermatomal distribution with negative clonus negative Homans. DTRs were difficult to this patient had difficulty relaxing. 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  Degenerative joint disease of shoulder Mildly displaced SLAP tear that extends into the biceps anchor with fluid signal intensity between these's of arachnoid deltoid bursa and the glenohumeral joint between the supraspinatus and infraspinatus tendons suspicious for a posterior supraspinatus full-thickness tear, partial tear and was also insertional partial with tearing of the supraspinatus and infraspinatus along the articular surface with moderate supraspinatus and if spinatus subscapularis tendinopathy  Bilateral greater occipital neuralgia  Depression/anxiety  Gastroenterological disorder      PLAN  Continue present medication tramadol and hydrocodone acetaminophen  F/U PCP Dr. Kary Kos for evaliation of  BP and general medical  condition as  discussed  F/U surgical evaluation. as discussed. Patient will follow up with surgeon as needed and as discussed  Psych evaluation as discussed. Need to proceed with psych appointment as previously discussed   F/U urologist as discussed  F/U neurological evaluation. May consider PNCV/EMG studies and other studies pending follow-up evaluations  May consider radiofrequency rhizolysis or intraspinal procedures pending response to present treatment and F/U evaluation We will avoid such procedures at this time  Patient to call [facility] Management Center should patient have concerns prior to scheduled return appointment.

## 2015-10-23 NOTE — Progress Notes (Signed)
Patient here for medication management Safety precautions to be maintained throughout the outpatient stay will include: orient to surroundings, keep bed in low position, maintain call bell within reach at all times, provide assistance with transfer out of bed and ambulation.  

## 2015-10-30 LAB — TOXASSURE SELECT 13 (MW), URINE: PDF: 0

## 2015-10-30 NOTE — Progress Notes (Signed)
Quick Note:  Reviewed. ______ 

## 2015-11-07 ENCOUNTER — Encounter: Payer: Self-pay | Admitting: Pain Medicine

## 2015-11-07 ENCOUNTER — Ambulatory Visit: Payer: Medicare Other | Attending: Pain Medicine | Admitting: Pain Medicine

## 2015-11-07 VITALS — BP 152/87 | HR 97 | Temp 96.8°F | Resp 18 | Ht 70.0 in | Wt 255.0 lb

## 2015-11-07 DIAGNOSIS — M19019 Primary osteoarthritis, unspecified shoulder: Secondary | ICD-10-CM | POA: Insufficient documentation

## 2015-11-07 DIAGNOSIS — F418 Other specified anxiety disorders: Secondary | ICD-10-CM | POA: Insufficient documentation

## 2015-11-07 DIAGNOSIS — M19011 Primary osteoarthritis, right shoulder: Secondary | ICD-10-CM

## 2015-11-07 DIAGNOSIS — M5416 Radiculopathy, lumbar region: Secondary | ICD-10-CM

## 2015-11-07 DIAGNOSIS — M5481 Occipital neuralgia: Secondary | ICD-10-CM

## 2015-11-07 DIAGNOSIS — S43409A Unspecified sprain of unspecified shoulder joint, initial encounter: Secondary | ICD-10-CM | POA: Diagnosis not present

## 2015-11-07 DIAGNOSIS — M5126 Other intervertebral disc displacement, lumbar region: Secondary | ICD-10-CM | POA: Insufficient documentation

## 2015-11-07 DIAGNOSIS — M62830 Muscle spasm of back: Secondary | ICD-10-CM | POA: Diagnosis not present

## 2015-11-07 DIAGNOSIS — M51369 Other intervertebral disc degeneration, lumbar region without mention of lumbar back pain or lower extremity pain: Secondary | ICD-10-CM

## 2015-11-07 DIAGNOSIS — M4806 Spinal stenosis, lumbar region: Secondary | ICD-10-CM | POA: Insufficient documentation

## 2015-11-07 DIAGNOSIS — M5136 Other intervertebral disc degeneration, lumbar region: Secondary | ICD-10-CM | POA: Diagnosis not present

## 2015-11-07 DIAGNOSIS — M25511 Pain in right shoulder: Secondary | ICD-10-CM | POA: Diagnosis present

## 2015-11-07 DIAGNOSIS — M25512 Pain in left shoulder: Secondary | ICD-10-CM | POA: Diagnosis present

## 2015-11-07 DIAGNOSIS — Z9889 Other specified postprocedural states: Secondary | ICD-10-CM

## 2015-11-07 DIAGNOSIS — M19012 Primary osteoarthritis, left shoulder: Secondary | ICD-10-CM

## 2015-11-07 DIAGNOSIS — X58XXXA Exposure to other specified factors, initial encounter: Secondary | ICD-10-CM | POA: Insufficient documentation

## 2015-11-07 DIAGNOSIS — M5030 Other cervical disc degeneration, unspecified cervical region: Secondary | ICD-10-CM

## 2015-11-07 MED ORDER — HYDROCODONE-ACETAMINOPHEN 5-325 MG PO TABS: ORAL_TABLET | ORAL | Status: DC

## 2015-11-07 MED ORDER — TRAMADOL HCL 50 MG PO TABS: ORAL_TABLET | ORAL | Status: DC

## 2015-11-07 NOTE — Progress Notes (Signed)
Safety precautions to be maintained throughout the outpatient stay will include: orient to surroundings, keep bed in low position, maintain call bell within reach at all times, provide assistance with transfer out of bed and ambulation.  

## 2015-11-07 NOTE — Patient Instructions (Addendum)
PLAN  Continue present medication tramadol and hydrocodone acetaminophen  Will consider genicular nerve blocks of the knee if pain continues in the region of the right knee  F/U PCP Dr. Kary Kos for evaliation of  BP and general medical  condition as discussed  F/U surgical evaluation. as discussed. Patient will follow up with surgeon as needed and as discussed  Psych evaluation as discussed. Need to proceed with psych appointment as previously discussed   F/U urologist as discussed  F/U neurological evaluation. May consider PNCV/EMG studies and other studies pending follow-up evaluations  May consider radiofrequency rhizolysis or intraspinal procedures pending response to present treatment and F/U evaluation We will avoid such procedures at this time  Patient to call Pain Management Center should patient have concerns prior to scheduled return appointment.Pain Management Discharge Instructions  General Discharge Instructions :  If you need to reach your doctor call: Monday-Friday 8:00 am - 4:00 pm at [phone] or toll free [phone].  After clinic hours [phone] to have operator reach doctor.  Bring all of your medication bottles to all your appointments in the pain clinic.  To cancel or reschedule your appointment with Pain Management please remember to call 24 hours in advance to avoid a fee.  Refer to the educational materials which you have been given on: General Risks, I had my Procedure. Discharge Instructions, Post Sedation.  Post Procedure Instructions:  The drugs you were given will stay in your system until tomorrow, so for the next 24 hours you should not drive, make any legal decisions or drink any alcoholic beverages.  You may eat anything you prefer, but it is better to start with liquids then soups and crackers, and gradually work up to solid foods.  Please notify your doctor immediately if you have any unusual bleeding, trouble breathing or pain that is  not related to your normal pain.  Depending on the type of procedure that was done, some parts of your body may feel week and/or numb.  This usually clears up by tonight or the next day.  Walk with the use of an assistive device or accompanied by an adult for the 24 hours.  You may use ice on the affected area for the first 24 hours.  Put ice in a Ziploc bag and cover with a towel and place against area 15 minutes on 15 minutes off.  You may switch to heat after 24 hours.

## 2015-11-07 NOTE — Progress Notes (Signed)
Subjective:    Patient ID: Gerald Ellis, male    DOB: Oct 12, 1965, 50 y.o.   MRN: [id number]  HPI  The patient is a 50 year old gentleman who returns to pain management for further evaluation and treatment of pain involving the shoulders neck entire back upper and lower extremity regions with prior surgical intervention of the lumbar region. On today's visit the patient states that his right knee is the most bothersome. The patient stated that he injured the knee in the past and that he has had return of significant pain involving the right knee. The patient denies any recent trauma or change in events of daily living the cost change in symptoms. The patient continues tramadol and hydrocodone acetaminophen. We will consider patient for genicular nerve blocks of the knee as well as other treatment pending response to the present treatment and follow-up evaluation. The patient was with understanding and agreed to suggested treatment plan  Review of Systems     Objective:   Physical Exam   There was tenderness of the splenius capitis and occipitalis region of mild to moderate degree. There was mild to moderate tenderness of the cervical facet cervical paraspinal musculature region and thoracic facet thoracic paraspinal musculature region with moderate tenderness to palpation and muscle spasm of the thoracic region noted. No crepitus of the thoracic region was noted. Palpation of the acromioclavicular and glenohumeral joint regions reproduce moderate discomfort. The patient was able to perform drop test with mild to moderate discomfort. The patient appeared to be with bilaterally equal grip strength and Tinel and Phalen's maneuver reproducing minimal discomfort. There was moderate tenderness over the lumbar facet lumbar paraspinal musculature region with lateral bending rotation extension and palpation of the lumbar facets reproducing moderate to moderately severe discomfort. There was moderate  tenderness over the PSIS PII S region and mild tenderness of the greater trochanteric region iliotibial band region. Straight leg raising was limited to 20 without an increase of pain with dorsiflexion noted. EHL strength appeared to be decreased. DTRs were difficult to elicit. The knee was attends to palpation with negative anterior and posterior drawer signs there was significant crepitus of the knee without increased warmth erythema of the knee noted. Range of motion maneuvers of the knee cause severe pain especially the right knee. There was negative clonus negative Homans. 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  Degenerative joint disease of shoulder Mildly displaced SLAP tear that extends into the biceps anchor with fluid signal intensity between these's of arachnoid deltoid bursa and the glenohumeral joint between the supraspinatus and infraspinatus tendons suspicious for a posterior supraspinatus full-thickness tear, partial tear and was also insertional partial with tearing of the supraspinatus and infraspinatus along the articular surface with moderate supraspinatus and if spinatus subscapularis tendinopathy  Bilateral greater occipital neuralgia  Depression/anxiety    PLAN  Continue present medication tramadol and hydrocodone acetaminophen  Will consider genicular nerve blocks of the knee if pain continues in the region of the right knee  F/U PCP Dr. Kary Kos for evaliation of  BP and general medical  condition as discussed  F/U surgical  evaluation. as discussed. Patient will follow up with surgeon as needed and as discussed  Psych evaluation as discussed. Need to proceed with psych appointment as previously discussed   F/U urologist as discussed  F/U neurological evaluation. May consider PNCV/EMG studies and other studies pending follow-up evaluations  May consider radiofrequency rhizolysis or intraspinal procedures pending response to present treatment and F/U evaluation We will avoid such procedures at this time  Patient to call Pain Management Center should patient have concerns prior to scheduled return appointment

## 2015-11-22 ENCOUNTER — Ambulatory Visit: Payer: Medicare Other | Admitting: Pain Medicine

## 2015-12-06 ENCOUNTER — Ambulatory Visit: Payer: Medicare Other | Attending: Pain Medicine | Admitting: Pain Medicine

## 2015-12-06 ENCOUNTER — Encounter: Payer: Self-pay | Admitting: Pain Medicine

## 2015-12-06 VITALS — BP 136/87 | HR 74 | Temp 98.2°F | Resp 16 | Ht 69.0 in | Wt 255.0 lb

## 2015-12-06 DIAGNOSIS — M5136 Other intervertebral disc degeneration, lumbar region: Secondary | ICD-10-CM

## 2015-12-06 DIAGNOSIS — M5030 Other cervical disc degeneration, unspecified cervical region: Secondary | ICD-10-CM

## 2015-12-06 DIAGNOSIS — Z9889 Other specified postprocedural states: Secondary | ICD-10-CM

## 2015-12-06 DIAGNOSIS — M19012 Primary osteoarthritis, left shoulder: Secondary | ICD-10-CM

## 2015-12-06 DIAGNOSIS — M5416 Radiculopathy, lumbar region: Secondary | ICD-10-CM

## 2015-12-06 DIAGNOSIS — M5481 Occipital neuralgia: Secondary | ICD-10-CM

## 2015-12-06 DIAGNOSIS — M19011 Primary osteoarthritis, right shoulder: Secondary | ICD-10-CM

## 2015-12-06 MED ORDER — TRAMADOL HCL 50 MG PO TABS: ORAL_TABLET | ORAL | Status: DC

## 2015-12-06 MED ORDER — HYDROCODONE-ACETAMINOPHEN 5-325 MG PO TABS: ORAL_TABLET | ORAL | Status: DC

## 2015-12-06 NOTE — Progress Notes (Signed)
Subjective:    Patient ID: Gerald MATTHEY, male    DOB: 08-06-1965, 50 y.o.   MRN: [id number]  HPI  The patient is a 50 year old gentleman who returns to pain management for further evaluation and treatment of pain involving the neck shoulders and upper extremity regions mid back lower back and lower extremity regions. The patient also has significant pain involving the knees. The patient denies any trauma change in events of daily living the call significant change in symptomatology. We discussed patient's condition on today's visit and we'll continue medications consisting of tramadol and hydrocodone acetaminophen. The patient states the pain of the knees is increased with standing walking and becomes more intense as the day progresses. We have discussed interventional treatment of the knees consisting of genicular nerve blocks of the knee as well as intra-articular injections. We informed patient that we would prefer to avoid intra-articular injections and that we will consider patient for genicular Nerve blocks if he continues to have significant pain involving the knee. We'll also advise patient undergo orthopedic follow-up evaluation. We will continue tramadol and hydrocodone acetaminophen and patient will call pain management should they be change in condition prior to scheduled return appointment. All agreed to suggested treatment plan   Review of Systems     Objective:   Physical Exam  There was tenderness of the splenius capitis and occipitalis region a mild to moderate degree with patient appeared to be with unremarkable Spurling's maneuver palpation over the acromioclavicular and glenohumeral joint regions reproducing mild to moderate discomfort. There was moderate tenderness of the trapezius levator scapula and rhomboid musculature regions left greater than the right. The patient appeared to be with bilaterally equal grip strength without increased pain with Tinel and Phalen's  maneuver. The patient also appeared to be able to perform drop test with only mild to moderate difficulty. Palpation of the thoracic region was with tenderness to palpation without crepitus of the thoracic region noted there was moderate muscle spasm of the thoracic and lumbar regions noted. Lateral bending rotation extension and palpation of the lumbar facets reproduce moderate discomfort. There was moderate tenderness of the PSIS and PII S region as well as the gluteal and piriformis musculature regions. Straight leg raising was tolerated to 20 with questionably increased pain with dorsiflexion noted. There was negative clonus negative Homans. 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  Degenerative joint disease of shoulder Mildly displaced SLAP tear that extends into the biceps anchor with fluid signal intensity between these's of arachnoid deltoid bursa and the glenohumeral joint between the supraspinatus and infraspinatus tendons suspicious for a posterior supraspinatus full-thickness tear, partial tear and was also insertional partial with tearing of the supraspinatus and infraspinatus along the articular surface with moderate supraspinatus and if spinatus subscapularis tendinopathy  Bilateral greater occipital neuralgia  Depression/anxiety     PLAN  Continue present medication tramadol and hydrocodone acetaminophen  F/U PCP Dr. Kary Kos for evaliation of  BP and general medical  condition as  discussed  F/U surgical evaluation. as discussed. Patient will follow up with surgeon as  needed and as discussed  Psych evaluation as discussed.  F/U urologist as discussed  F/U neurological evaluation. May consider PNCV/EMG studies and other studies pending follow-up evaluations  May consider radiofrequency rhizolysis or intraspinal procedures pending response to present treatment and F/U evaluation We will avoid such procedures at this time  Patient to call Pain Management Center should patient have concerns prior to scheduled return appointment

## 2015-12-06 NOTE — Progress Notes (Signed)
Safety precautions to be maintained throughout the outpatient stay will include: orient to surroundings, keep bed in low position, maintain call bell within reach at all times, provide assistance with transfer out of bed and ambulation.  

## 2015-12-06 NOTE — Patient Instructions (Signed)
PLAN  Continue present medication tramadol and hydrocodone acetaminophen  F/U PCP Dr. Kary Kos for evaliation of  BP and general medical  condition as discussed  F/U surgical evaluation. as discussed. Patient will follow up with surgeon as needed and as discussed  Psych evaluation as discussed.  F/U urologist as discussed  F/U neurological evaluation. May consider PNCV/EMG studies and other studies pending follow-up evaluations  May consider radiofrequency rhizolysis or intraspinal procedures pending response to present treatment and F/U evaluation We will avoid such procedures at this time  Patient to call Pain Management Center should patient have concerns prior to scheduled return appointment

## 2016-01-04 ENCOUNTER — Ambulatory Visit: Payer: Medicare Other | Attending: Pain Medicine | Admitting: Pain Medicine

## 2016-01-04 ENCOUNTER — Encounter: Payer: Self-pay | Admitting: Pain Medicine

## 2016-01-04 VITALS — BP 146/89 | HR 95 | Temp 98.4°F | Resp 18 | Ht 69.0 in | Wt 240.0 lb

## 2016-01-04 DIAGNOSIS — F329 Major depressive disorder, single episode, unspecified: Secondary | ICD-10-CM | POA: Insufficient documentation

## 2016-01-04 DIAGNOSIS — M19019 Primary osteoarthritis, unspecified shoulder: Secondary | ICD-10-CM | POA: Diagnosis not present

## 2016-01-04 DIAGNOSIS — M19011 Primary osteoarthritis, right shoulder: Secondary | ICD-10-CM

## 2016-01-04 DIAGNOSIS — M5481 Occipital neuralgia: Secondary | ICD-10-CM | POA: Diagnosis not present

## 2016-01-04 DIAGNOSIS — M62830 Muscle spasm of back: Secondary | ICD-10-CM | POA: Insufficient documentation

## 2016-01-04 DIAGNOSIS — M5126 Other intervertebral disc displacement, lumbar region: Secondary | ICD-10-CM | POA: Insufficient documentation

## 2016-01-04 DIAGNOSIS — M25512 Pain in left shoulder: Secondary | ICD-10-CM | POA: Diagnosis present

## 2016-01-04 DIAGNOSIS — X58XXXA Exposure to other specified factors, initial encounter: Secondary | ICD-10-CM | POA: Insufficient documentation

## 2016-01-04 DIAGNOSIS — M5136 Other intervertebral disc degeneration, lumbar region: Secondary | ICD-10-CM | POA: Diagnosis not present

## 2016-01-04 DIAGNOSIS — M4806 Spinal stenosis, lumbar region: Secondary | ICD-10-CM | POA: Diagnosis not present

## 2016-01-04 DIAGNOSIS — M51369 Other intervertebral disc degeneration, lumbar region without mention of lumbar back pain or lower extremity pain: Secondary | ICD-10-CM

## 2016-01-04 DIAGNOSIS — M19012 Primary osteoarthritis, left shoulder: Secondary | ICD-10-CM

## 2016-01-04 DIAGNOSIS — F419 Anxiety disorder, unspecified: Secondary | ICD-10-CM | POA: Insufficient documentation

## 2016-01-04 DIAGNOSIS — M25511 Pain in right shoulder: Secondary | ICD-10-CM | POA: Diagnosis present

## 2016-01-04 DIAGNOSIS — M5416 Radiculopathy, lumbar region: Secondary | ICD-10-CM

## 2016-01-04 DIAGNOSIS — S43499A Other sprain of unspecified shoulder joint, initial encounter: Secondary | ICD-10-CM | POA: Diagnosis not present

## 2016-01-04 DIAGNOSIS — M542 Cervicalgia: Secondary | ICD-10-CM | POA: Diagnosis present

## 2016-01-04 DIAGNOSIS — Z9889 Other specified postprocedural states: Secondary | ICD-10-CM

## 2016-01-04 DIAGNOSIS — M5030 Other cervical disc degeneration, unspecified cervical region: Secondary | ICD-10-CM

## 2016-01-04 MED ORDER — HYDROCODONE-ACETAMINOPHEN 5-325 MG PO TABS: ORAL_TABLET | ORAL | 0 refills | Status: DC

## 2016-01-04 MED ORDER — TRAMADOL HCL 50 MG PO TABS: ORAL_TABLET | ORAL | 0 refills | Status: DC

## 2016-01-04 NOTE — Progress Notes (Signed)
The patient is a 50 year old gentleman who returns to pain management for further evaluation and treatment of pain involving the neck shoulders and upper mid lower back and lower extremity regions. The patient also has had pain involving the region of the knees on today's visit we discussed patient's condition and patient has had some exacerbation of pain involving the region of the shoulders. We will consider patient for interventional treatment of the shoulder and we'll continue medications as discussed at this time. The patient is without plans with additional surgery of the lumbar region at this time. The patient significant abnormalities of the lumbar spine and is undergone surgical reevaluation with there being concern regarding need for additional surgery. At the present time patient is without plans to proceed with additional surgery. We discussed patient's condition and we will observe response to present treatment regimen and we will remain available to consider interventional treatment of the shoulder or knees pending response to treatment and follow-up evaluation. The patient was with understanding and agreed to treatment plan. Patient will continue tramadol and hydrocodone acetaminophen as prescribed. All agreed to suggested treatment plan.    Physical examination  There was tenderness to palpation of the splenius capitis and occipitalis region a mild degree with mild tenderness of the cervical facet cervical paraspinal musculature region. Palpation of the acromioclavicular and glenohumeral joint regions reproduced pain of moderate degree with moderate to moderately severe tenderness to palpation on the right compared to the left. There was decreased range of motion of the shoulders noted with patient having moderate difficulty performing drop test especially on the right compared to the left. There appeared to be unremarkable Spurling's maneuver. Thoracic region was attends to palpation  with moderate muscle spasm of the thoracic region with no crepitus of the thoracic region noted. Palpation over the lumbar paraspinal musculatures and lumbar facet region was with moderate muscle spasms with lateral bending rotation extension and palpation of the lumbar facets reproducing moderate discomfort. Palpation over the PSIS and PII S region reproduces moderate discomfort with straight leg raising tolerate approximately 20 without increased pain with dorsiflexion noted. DTRs appeared to be trace at the knees. There was negative clonus negative Homans EHL strength appeared to be decreased. There was questionably decreased sensation of the L5 dermatomal distribution detected. Abdomen nontender with no costovertebral tenderness noted.    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  Degenerative joint disease of shoulder Mildly displaced SLAP tear that extends into the biceps anchor with fluid signal intensity between these's of arachnoid deltoid bursa and the glenohumeral joint between the supraspinatus and infraspinatus tendons suspicious for a posterior supraspinatus full-thickness tear, partial tear and was also insertional partial with tearing of the supraspinatus and infraspinatus along the articular surface with moderate supraspinatus and if spinatus subscapularis tendinopathy  Bilateral greater occipital neuralgia  Depression/anxiety     PLAN    Continue present medications.  May consider injection of shoulder as well as genicular nerve blocks for pain of the knees  F/U PCP for evaliation of  BP and general  medical  condition..  F/U surgical evaluation.. We have scheduled orthopedic evaluation of your hand. Please ask date of your appointment  F/U neurological evaluation.  May consider radiofrequency rhizolysis or intraspinal procedures pending response to present treatment and F/U evaluation.  Patient to call Pain Management Center should patient have concerns prior to scheduled return appointmen  Hydrocodone and Tramadol scripts given to patient.

## 2016-01-04 NOTE — Patient Instructions (Addendum)
PLAN    Continue present medications.  May consider injection of shoulder as well as genicular nerve blocks for pain of the knees  F/U PCP for evaliation of  BP and general medical  condition..  F/U surgical evaluation.. We have scheduled orthopedic evaluation of your hand. Please ask date of your appointment  F/U neurological evaluation.  May consider radiofrequency rhizolysis or intraspinal procedures pending response to present treatment and F/U evaluation.  Patient to call Pain Management Center should patient have concerns prior to scheduled return appointmen  Hydrocodone and Tramadol scripts given to patient.

## 2016-01-04 NOTE — Progress Notes (Signed)
Safety precautions to be maintained throughout the outpatient stay will include: orient to surroundings, keep bed in low position, maintain call bell within reach at all times, provide assistance with transfer out of bed and ambulation.  

## 2016-01-26 ENCOUNTER — Telehealth: Payer: Self-pay | Admitting: *Deleted

## 2016-02-05 ENCOUNTER — Ambulatory Visit: Payer: Medicare Other | Admitting: Sports Medicine

## 2016-02-05 ENCOUNTER — Encounter (INDEPENDENT_AMBULATORY_CARE_PROVIDER_SITE_OTHER): Payer: Self-pay

## 2016-02-11 ENCOUNTER — Other Ambulatory Visit: Payer: Self-pay | Admitting: Pain Medicine

## 2016-02-12 ENCOUNTER — Other Ambulatory Visit: Payer: Self-pay | Admitting: Pain Medicine

## 2016-02-15 ENCOUNTER — Ambulatory Visit: Payer: Medicare Other | Admitting: Pain Medicine

## 2017-09-15 ENCOUNTER — Encounter: Payer: Self-pay | Admitting: *Deleted

## 2017-09-16 ENCOUNTER — Encounter
Admission: RE | Disposition: A | Discharge status: Home / Self Care | Payer: Self-pay | Source: Ambulatory Visit | Attending: Gastroenterology

## 2017-09-16 ENCOUNTER — Encounter: Payer: Self-pay | Admitting: *Deleted

## 2017-09-16 ENCOUNTER — Ambulatory Visit: Payer: Medicare Other | Admitting: Certified Registered Nurse Anesthetist

## 2017-09-16 ENCOUNTER — Ambulatory Visit
Admission: RE | Admit: 2017-09-16 | Discharge: 2017-09-16 | Disposition: A | Discharge status: Home / Self Care | Payer: Medicare Other | Source: Ambulatory Visit | Attending: Gastroenterology | Admitting: Gastroenterology

## 2017-09-16 DIAGNOSIS — D123 Benign neoplasm of transverse colon: Secondary | ICD-10-CM | POA: Diagnosis not present

## 2017-09-16 DIAGNOSIS — D124 Benign neoplasm of descending colon: Secondary | ICD-10-CM | POA: Insufficient documentation

## 2017-09-16 DIAGNOSIS — Z86010 Personal history of colonic polyps: Secondary | ICD-10-CM | POA: Insufficient documentation

## 2017-09-16 DIAGNOSIS — E7800 Pure hypercholesterolemia, unspecified: Secondary | ICD-10-CM | POA: Insufficient documentation

## 2017-09-16 DIAGNOSIS — K2950 Unspecified chronic gastritis without bleeding: Secondary | ICD-10-CM | POA: Diagnosis not present

## 2017-09-16 DIAGNOSIS — K3189 Other diseases of stomach and duodenum: Secondary | ICD-10-CM | POA: Diagnosis not present

## 2017-09-16 DIAGNOSIS — E119 Type 2 diabetes mellitus without complications: Secondary | ICD-10-CM | POA: Diagnosis not present

## 2017-09-16 DIAGNOSIS — Z6832 Body mass index (BMI) 32.0-32.9, adult: Secondary | ICD-10-CM | POA: Diagnosis not present

## 2017-09-16 DIAGNOSIS — J45909 Unspecified asthma, uncomplicated: Secondary | ICD-10-CM | POA: Diagnosis not present

## 2017-09-16 DIAGNOSIS — K5730 Diverticulosis of large intestine without perforation or abscess without bleeding: Secondary | ICD-10-CM | POA: Insufficient documentation

## 2017-09-16 DIAGNOSIS — E291 Testicular hypofunction: Secondary | ICD-10-CM | POA: Insufficient documentation

## 2017-09-16 DIAGNOSIS — Z79899 Other long term (current) drug therapy: Secondary | ICD-10-CM | POA: Insufficient documentation

## 2017-09-16 DIAGNOSIS — I10 Essential (primary) hypertension: Secondary | ICD-10-CM | POA: Diagnosis not present

## 2017-09-16 DIAGNOSIS — K209 Esophagitis, unspecified: Secondary | ICD-10-CM | POA: Diagnosis not present

## 2017-09-16 DIAGNOSIS — Z886 Allergy status to analgesic agent status: Secondary | ICD-10-CM | POA: Insufficient documentation

## 2017-09-16 DIAGNOSIS — Z794 Long term (current) use of insulin: Secondary | ICD-10-CM | POA: Insufficient documentation

## 2017-09-16 DIAGNOSIS — Z1211 Encounter for screening for malignant neoplasm of colon: Secondary | ICD-10-CM | POA: Insufficient documentation

## 2017-09-16 HISTORY — PX: ESOPHAGOGASTRODUODENOSCOPY (EGD) WITH PROPOFOL: SHX5813

## 2017-09-16 HISTORY — PX: COLONOSCOPY WITH PROPOFOL: SHX5780

## 2017-09-16 LAB — GLUCOSE, CAPILLARY: Glucose-Capillary: 303 mg/dL — ABNORMAL HIGH (ref 65–99)

## 2017-09-16 SURGERY — COLONOSCOPY WITH PROPOFOL
Anesthesia: General

## 2017-09-16 MED ORDER — MIDAZOLAM HCL 2 MG/2ML IJ SOLN
INTRAMUSCULAR | Status: AC
  Filled 2017-09-16: qty 2

## 2017-09-16 MED ORDER — LIDOCAINE HCL (PF) 2 % IJ SOLN
INTRAMUSCULAR | Status: AC
  Filled 2017-09-16: qty 10

## 2017-09-16 MED ORDER — PROPOFOL 500 MG/50ML IV EMUL
INTRAVENOUS | Status: AC
Start: 2017-09-16 — End: ?
  Filled 2017-09-16: qty 50

## 2017-09-16 MED ORDER — PROPOFOL 10 MG/ML IV BOLUS
INTRAVENOUS | Status: DC | PRN
  Administered 2017-09-16: 80 mg via INTRAVENOUS

## 2017-09-16 MED ORDER — PROPOFOL 10 MG/ML IV BOLUS
INTRAVENOUS | Status: AC
  Filled 2017-09-16: qty 20

## 2017-09-16 MED ORDER — SODIUM CHLORIDE 0.9 % IV SOLN
INTRAVENOUS | Status: DC
  Administered 2017-09-16: 1000 mL via INTRAVENOUS

## 2017-09-16 MED ORDER — PROPOFOL 500 MG/50ML IV EMUL
INTRAVENOUS | Status: AC
  Filled 2017-09-16: qty 50

## 2017-09-16 MED ORDER — PHENYLEPHRINE HCL 10 MG/ML IJ SOLN
INTRAMUSCULAR | Status: DC | PRN
  Administered 2017-09-16 (×5): 100 ug via INTRAVENOUS

## 2017-09-16 MED ORDER — FENTANYL CITRATE (PF) 100 MCG/2ML IJ SOLN
INTRAMUSCULAR | Status: AC
  Filled 2017-09-16: qty 2

## 2017-09-16 MED ORDER — SODIUM CHLORIDE 0.9 % IV SOLN: INTRAVENOUS | Status: DC

## 2017-09-16 MED ORDER — FENTANYL CITRATE (PF) 100 MCG/2ML IJ SOLN
INTRAMUSCULAR | Status: DC | PRN
  Administered 2017-09-16: 50 ug via INTRAVENOUS

## 2017-09-16 MED ORDER — MIDAZOLAM HCL 2 MG/2ML IJ SOLN
INTRAMUSCULAR | Status: DC | PRN
  Administered 2017-09-16: 2 mg via INTRAVENOUS

## 2017-09-16 MED ORDER — LIDOCAINE HCL (CARDIAC) PF 100 MG/5ML IV SOSY
PREFILLED_SYRINGE | INTRAVENOUS | Status: DC | PRN
  Administered 2017-09-16: 50 mg via INTRAVENOUS

## 2017-09-16 MED ORDER — PROPOFOL 500 MG/50ML IV EMUL
INTRAVENOUS | Status: DC | PRN
  Administered 2017-09-16: 140 ug/kg/min via INTRAVENOUS

## 2017-09-16 NOTE — Anesthesia Post-op Follow-up Note (Signed)
Anesthesia QCDR form completed.        

## 2017-09-16 NOTE — Transfer of Care (Signed)
Immediate Anesthesia Transfer of Care Note  Patient: Gerald Ellis  Procedure(s) Performed: COLONOSCOPY WITH PROPOFOL (N/A ) ESOPHAGOGASTRODUODENOSCOPY (EGD) WITH PROPOFOL (N/A )  Patient Location: PACU and Endoscopy Unit  Anesthesia Type:General  Level of Consciousness: drowsy  Airway & Oxygen Therapy: Patient Spontanous Breathing and Patient connected to nasal cannula oxygen  Post-op Assessment: Report given to RN and Post -op Vital signs reviewed and stable  Post vital signs: Reviewed and stable  Last Vitals:  Vitals Value Taken Time  BP 103/67 09/16/2017 11:19 AM  Temp 35.8 C 09/16/2017 11:18 AM  Pulse 84 09/16/2017 11:20 AM  Resp 16 09/16/2017 11:20 AM  SpO2 99 % 09/16/2017 11:20 AM  Vitals shown include unvalidated device data.  Last Pain:  Vitals:   09/16/17 0927  TempSrc: Tympanic  PainSc: 8          Complications: No apparent anesthesia complications

## 2017-09-16 NOTE — Anesthesia Postprocedure Evaluation (Signed)
Anesthesia Post Note  Patient: Gerald Ellis  Procedure(s) Performed: COLONOSCOPY WITH PROPOFOL (N/A ) ESOPHAGOGASTRODUODENOSCOPY (EGD) WITH PROPOFOL (N/A )  Patient location during evaluation: Endoscopy Anesthesia Type: General Level of consciousness: awake and alert Pain management: pain level controlled Vital Signs Assessment: post-procedure vital signs reviewed and stable Respiratory status: spontaneous breathing, nonlabored ventilation, respiratory function stable and patient connected to nasal cannula oxygen Cardiovascular status: blood pressure returned to baseline and stable Postop Assessment: no apparent nausea or vomiting Anesthetic complications: no     Last Vitals:  Vitals:   09/16/17 1140 09/16/17 1150  BP: (!) 135/99 (!) 135/99  Pulse: 86 84  Resp: 20 19  Temp:    SpO2: 98% 99%    Last Pain:  Vitals:   09/16/17 1128  TempSrc:   PainSc: 0-No pain                 Martha Clan

## 2017-09-16 NOTE — Op Note (Addendum)
[facility] Patient Name: Gerald Ellis Procedure Date: 09/16/2017 10:18 AM MRN: [id number] Account #: [id number] Date of Birth: Mar 22, 1966 Admit Type: Outpatient Age: 52 Room: [facility] ENDO ROOM 3 Gender: Male Note Status: Finalized Procedure:            Upper GI endoscopy Indications:          Abdominal pain in the left upper quadrant, Dyspepsia Providers:            Lollie Sails, MD Referring MD:         Irven Easterly. Kary Kos, MD (Referring MD) Medicines:            Monitored Anesthesia Care Complications:        No immediate complications. Procedure:            Pre-Anesthesia Assessment:                       - ASA Grade Assessment: III - A patient with severe                        systemic disease.                       After obtaining informed consent, the endoscope was                        passed under direct vision. Throughout the procedure,                        the patient's blood pressure, pulse, and oxygen                        saturations were monitored continuously. The Endoscope                        was introduced through the mouth, and advanced to the                        third part of duodenum. The upper GI endoscopy was                        accomplished without difficulty. The patient tolerated                        the procedure well. Findings:      LA Grade C (one or more mucosal breaks continuous between tops of 2 or       more mucosal folds, less than 75% circumference) esophagitis with no       bleeding was found. Biopsies were taken with a cold forceps for       histology.      The exam of the esophagus was otherwise normal.      Diffuse moderate inflammation characterized by congestion (edema) and       erythema was found in the entire examined stomach. Biopsies were taken       with a cold forceps for histology. Biopsies were taken with a cold       forceps for Helicobacter pylori testing.      The cardia  and gastric fundus were normal on retroflexion otherwise.      Patchy mild inflammation characterized by congestion (edema) and  erythema was found in the first portion of the duodenum. Biopsies were       taken with a cold forceps for histology. Impression:           - LA Grade C erosive esophagitis. Biopsied.                       - Gastritis. Biopsied.                       - Erosive duodenitis. Biopsied. Recommendation:       - Low residue diet daily. Procedure Code(s):    --- Professional ---                       [phone], Esophagogastroduodenoscopy, flexible, transoral;                        with biopsy, single or multiple Diagnosis Code(s):    --- Professional ---                       K20.8, Other esophagitis                       K29.70, Gastritis, unspecified, without bleeding                       K29.80, Duodenitis without bleeding                       R10.12, Left upper quadrant pain                       R10.13, Epigastric pain CPT copyright 2017 [employer]. All rights reserved. The codes documented in this report are preliminary and upon coder review may  be revised to meet current compliance requirements. Lollie Sails, MD 09/16/2017 10:43:13 AM This report has been signed electronically. Number of Addenda: 0 Note Initiated On: 09/16/2017 10:18 AM      [facility]

## 2017-09-16 NOTE — H&P (Addendum)
Outpatient short stay form Pre-procedure 09/16/2017 10:15 AM Gerald Sails MD  Primary Physician: [place] Pink, MD  Reason for visit: EGD and colonoscopy  History of present illness: Patient is a 52 year old male presenting today as above.  He has a history of some left-sided abdominal pain generalized abdominal discomfort history of adenomatous colon polyps and diabetic gastroparesis.  He is poorly controlled on his blood sugars.  He currently takes a proton pump inhibitor as well as low-dose Reglan.  This has been beneficial for him.  He tolerated his prep well.  He denies use of any aspirin or blood thinning agent.    Current Facility-Administered Medications:  .  0.9 %  sodium chloride infusion, , Intravenous, Continuous, Gerald Sails, MD, Last Rate: 20 mL/hr at 09/16/17 0944, 1,000 mL at 09/16/17 0944 .  0.9 %  sodium chloride infusion, , Intravenous, Continuous, Gerald Sails, MD  Medications Prior to Admission  Medication Sig Dispense Refill Last Dose  . cyclobenzaprine (FLEXERIL) 10 MG tablet Take 10 mg by mouth at bedtime.   Past Week at Unknown time  . DHA-EPA-VITAMIN E PO Take 1 tablet by mouth daily.   Past Week at Unknown time  . diclofenac sodium (VOLTAREN) 1 % GEL Apply 2-4 g topically 4 (four) times daily. Reported on 10/23/2015   Past Week at Unknown time  . fluticasone (FLONASE) 50 MCG/ACT nasal spray Place 1 spray into both nostrils daily.   Past Week at Unknown time  . gabapentin (NEURONTIN) 300 MG capsule Take 300 mg by mouth at bedtime. 2 capsules at bedtime   Past Week at Unknown time  . glipiZIDE (GLUCOTROL) 10 MG tablet Take 10 mg by mouth daily before breakfast. Reported on 10/23/2015   Past Week at Unknown time  . HYDROcodone-acetaminophen (NORCO/VICODIN) 5-325 MG tablet Limit 1 tab by mouth per day or twice a day if tolerated 50 tablet 0 Past Week at Unknown time  . insulin detemir (LEVEMIR) 100 UNIT/ML injection Inject 28 Units into the skin every  morning. Reported on 10/23/2015   Past Week at Unknown time  . insulin lispro (HUMALOG) 100 UNIT/ML injection Inject 20 Units into the skin 3 (three) times daily with meals. Reported on 10/23/2015   Past Week at Unknown time  . insulin NPH Human (HUMULIN N,NOVOLIN N) 100 UNIT/ML injection Inject into the skin. 60 units in the a.m. And 70 units at night   Past Week at Unknown time  . insulin regular (NOVOLIN R,HUMULIN R) 100 units/mL injection Reported on 05/03/2015   Past Week at Unknown time  . lisinopril (PRINIVIL,ZESTRIL) 20 MG tablet Take 20 mg by mouth 2 (two) times daily.   Past Week at Unknown time  . metFORMIN (GLUCOPHAGE) 1000 MG tablet Take 1,000 mg by mouth 1 day or 1 dose.    Past Week at Unknown time  . Multiple Vitamins-Minerals (MULTIVITAMIN WITH MINERALS) tablet Take 1 tablet by mouth daily.   Past Week at Unknown time  . ondansetron (ZOFRAN) 4 MG tablet Take 1 tablet (4 mg total) by mouth daily as needed for nausea or vomiting. 20 tablet 1 Past Week at Unknown time  . pantoprazole (PROTONIX) 40 MG tablet TAKE 1 TABLET BY MOUTH EVERY DAY   Past Week at Unknown time  . pioglitazone (ACTOS) 45 MG tablet Take 45 mg by mouth daily. Reported on 10/23/2015   Past Week at Unknown time  . simvastatin (ZOCOR) 40 MG tablet Take 40 mg by mouth daily.   Past  Week at Unknown time  . testosterone cypionate (DEPOTESTOTERONE CYPIONATE) 200 MG/ML injection Inject 200 mg into the muscle every 21 ( twenty-one) days. Reported on 10/23/2015   Past Week at Unknown time  . traMADol (ULTRAM) 50 MG tablet Limit 1 tab by mouth a day or twice a day if tolerated 60 tablet 0 Past Week at Unknown time  . albuterol (PROAIR HFA) 108 (90 BASE) MCG/ACT inhaler Inhale into the lungs.   Taking  . hydrochlorothiazide (HYDRODIURIL) 12.5 MG tablet Take by mouth.   Taking     Allergies  Allergen Reactions  . Ibuprofen Shortness Of Breath  . Nsaids Swelling    Swelling of throat     Past Medical History:  Diagnosis Date   . Benign enlargement of prostate   . Bronchitis   . Childhood asthma   . Diabetes mellitus without complication ([place])   . Environmental allergies   . Erectile dysfunction   . Headache   . Hemorrhoids   . Hypercholesteremia   . Hypertension   . Hypogonadism in male   . Lumbago   . Over weight   Review of systems:      Physical Exam    Heart and lungs: Regular rate and rhythm without rub or gallop, lungs are bilaterally clear.    HEENT: Normocephalic atraumatic eyes are anicteric    Other:   Pertinant exam for procedure: Soft tender to palpation in the left abdomen, no masses or rebound, positive fullness in the left lower quadrant. nondistended bowel sounds positive normoactive    Planned proceedures: EGD, colonoscopy and indicated procedures. I have discussed the risks benefits and complications of procedures to include not limited to bleeding, infection, perforation and the risk of sedation and the patient wishes to proceed.    Gerald Sails, MD Gastroenterology 09/16/2017  10:15 AM

## 2017-09-16 NOTE — Anesthesia Preprocedure Evaluation (Signed)
Anesthesia Evaluation  Patient identified by MRN, date of birth, ID band Patient awake    Reviewed: Allergy & Precautions, NPO status , Patient's Chart, lab work & pertinent test results  History of Anesthesia Complications Negative for: history of anesthetic complications  Airway Mallampati: III       Dental  (+) Poor Dentition   Pulmonary asthma ,     + decreased breath sounds      Cardiovascular Exercise Tolerance: Poor hypertension, Pt. on medications  Rhythm:Regular     Neuro/Psych    GI/Hepatic negative GI ROS, Neg liver ROS,   Endo/Other  diabetes, Well Controlled, Type 1, Insulin DependentMorbid obesity  Renal/GU negative Renal ROS     Musculoskeletal   Abdominal (+) + obese,   Peds  Hematology negative hematology ROS (+)   Anesthesia Other Findings Past Medical History: No date: Benign enlargement of prostate No date: Bronchitis No date: Childhood asthma No date: Diabetes mellitus without complication (H[facility]) No date: Environmental allergies No date: Erectile dysfunction No date: Headache No date: Hemorrhoids No date: Hypercholesteremia No date: Hypertension No date: Hypogonadism in male No date: Lumbago No date: Over weight   Reproductive/Obstetrics                             Anesthesia Physical  Anesthesia Plan  ASA: III  Anesthesia Plan: General   Post-op Pain Management:    Induction: Intravenous  PONV Risk Score and Plan: 1 and Propofol infusion  Airway Management Planned: Natural Airway and Nasal Cannula  Additional Equipment:   Intra-op Plan:   Post-operative Plan:   Informed Consent: I have reviewed the patients History and Physical, chart, labs and discussed the procedure including the risks, benefits and alternatives for the proposed anesthesia with the patient or authorized representative who has indicated his/her understanding and acceptance.      Plan Discussed with: CRNA  Anesthesia Plan Comments:         Anesthesia Quick Evaluation

## 2017-09-16 NOTE — Op Note (Signed)
[facility] Gastroenterology Patient Name: Gerald Ellis Procedure Date: 09/16/2017 10:17 AM MRN: [id number] Account #: [id number] Date of Birth: Aug 26, 1965 Admit Type: Outpatient Age: 52 Room: [facility] ENDO ROOM 3 Gender: Male Note Status: Finalized Procedure:            Colonoscopy Indications:          Personal history of colonic polyps Providers:            Lollie Sails, MD Referring MD:         Irven Easterly. Kary Kos, MD (Referring MD) Medicines:            Monitored Anesthesia Care Complications:        No immediate complications. Procedure:            Pre-Anesthesia Assessment:                       - ASA Grade Assessment: III - A patient with severe                        systemic disease.                       After obtaining informed consent, the colonoscope was                        passed under direct vision. Throughout the procedure,                        the patient's blood pressure, pulse, and oxygen                        saturations were monitored continuously. The                        Colonoscope was introduced through the anus and                        advanced to the the cecum, identified by appendiceal                        orifice and ileocecal valve. The colonoscopy was                        performed without difficulty. The patient tolerated the                        procedure well. The quality of the bowel preparation                        was good. Findings:      The digital rectal exam was normal.      A 10 mm polyp was found in the descending colon. The polyp was sessile.       The polyp was removed with a cold snare. Resection and retrieval were       complete.      Two sessile polyps were found in the transverse colon. The polyps were 7       to 11 mm in size. These polyps were removed with a cold snare. Resection       and retrieval were complete. To prevent bleeding after the polypectomy,  one hemostatic clip was  successfully placed (MR conditional). There was       no bleeding at the end of the maneuver.      Two sessile polyps were found in the proximal transverse colon. The       polyps were 5 to 9 mm in size. These polyps were removed with a cold       snare. Resection and retrieval were complete.      Multiple medium-mouthed diverticula were found in the sigmoid colon and       descending colon.      No evidence of diverticulitis.      The exam was otherwise normal throughout the examined colon.      The retroflexed view of the distal rectum and anal verge was normal and       showed no anal or rectal abnormalities. Impression:           - One 10 mm polyp in the descending colon, removed with                        a cold snare. Resected and retrieved.                       - Two 7 to 11 mm polyps in the transverse colon,                        removed with a cold snare. Resected and retrieved. Clip                        (MR conditional) was placed.                       - Two 5 to 9 mm polyps in the proximal transverse                        colon, removed with a cold snare. Resected and                        retrieved.                       - Diverticulosis in the sigmoid colon and in the                        descending colon.                       - The distal rectum and anal verge are normal on                        retroflexion view. Recommendation:       - Discharge patient to home.                       - Await pathology results.                       - Full liquid diet for 1 day, then advance as tolerated                        to low residue diet for the rest of the patient's life.                       -  Await pathology results.                       - Return to GI clinic in 3 weeks.                       - Telephone GI clinic for pathology results in 1 week. Procedure Code(s):    --- Professional ---                       [phone], Colonoscopy, flexible; with removal of  tumor(s),                        polyp(s), or other lesion(s) by snare technique Diagnosis Code(s):    --- Professional ---                       D12.4, Benign neoplasm of descending colon                       D12.3, Benign neoplasm of transverse colon (hepatic                        flexure or splenic flexure)                       Z86.010, Personal history of colonic polyps                       K57.30, Diverticulosis of large intestine without                        perforation or abscess without bleeding CPT copyright 2017 American Medical Association. All rights reserved. The codes documented in this report are preliminary and upon coder review may  be revised to meet current compliance requirements. Lollie Sails, MD 09/16/2017 11:17:26 AM This report has been signed electronically. Number of Addenda: 0 Note Initiated On: 09/16/2017 10:17 AM Scope Withdrawal Time: 0 hours 13 minutes 21 seconds  Total Procedure Duration: 0 hours 24 minutes 44 seconds       [facility]

## 2017-09-18 ENCOUNTER — Encounter: Payer: Self-pay | Admitting: Gastroenterology

## 2017-09-19 LAB — SURGICAL PATHOLOGY

## 2017-11-08 ENCOUNTER — Encounter: Payer: Self-pay | Admitting: Emergency Medicine

## 2017-11-08 ENCOUNTER — Other Ambulatory Visit: Payer: Self-pay

## 2017-11-08 ENCOUNTER — Emergency Department
Admission: EM | Admit: 2017-11-08 | Discharge: 2017-11-09 | Disposition: A | Discharge status: Home / Self Care | Payer: Medicare Other | Attending: Emergency Medicine | Admitting: Emergency Medicine

## 2017-11-08 DIAGNOSIS — Z79899 Other long term (current) drug therapy: Secondary | ICD-10-CM | POA: Diagnosis not present

## 2017-11-08 DIAGNOSIS — E119 Type 2 diabetes mellitus without complications: Secondary | ICD-10-CM | POA: Diagnosis not present

## 2017-11-08 DIAGNOSIS — Z794 Long term (current) use of insulin: Secondary | ICD-10-CM | POA: Diagnosis not present

## 2017-11-08 DIAGNOSIS — R112 Nausea with vomiting, unspecified: Secondary | ICD-10-CM | POA: Insufficient documentation

## 2017-11-08 DIAGNOSIS — R109 Unspecified abdominal pain: Secondary | ICD-10-CM | POA: Diagnosis not present

## 2017-11-08 LAB — COMPREHENSIVE METABOLIC PANEL
ALBUMIN: 3.6 g/dL (ref 3.5–5.0)
ALK PHOS: 67 U/L (ref 38–126)
ALT: 33 U/L (ref 17–63)
ANION GAP: 9 (ref 5–15)
AST: 30 U/L (ref 15–41)
BILIRUBIN TOTAL: 1 mg/dL (ref 0.3–1.2)
BUN: 11 mg/dL (ref 6–20)
CALCIUM: 8.8 mg/dL — AB (ref 8.9–10.3)
CO2: 23 mmol/L (ref 22–32)
CREATININE: 0.63 mg/dL (ref 0.61–1.24)
Chloride: 102 mmol/L (ref 101–111)
GFR calc non Af Amer: >60 mL/min (ref >60)
GLUCOSE: 255 mg/dL — AB (ref 65–99)
Potassium: 3.7 mmol/L (ref 3.5–5.1)
SODIUM: 134 mmol/L — AB (ref 135–145)
TOTAL PROTEIN: 7.2 g/dL (ref 6.5–8.1)

## 2017-11-08 LAB — CBC
HCT: 44.2 % (ref 40.0–52.0)
HEMOGLOBIN: 15.9 g/dL (ref 13.0–18.0)
MCH: 32.3 pg (ref 26.0–34.0)
MCHC: 36 g/dL (ref 32.0–36.0)
MCV: 89.7 fL (ref 80.0–100.0)
PLATELETS: 229 10*3/uL (ref 150–440)
RBC: 4.92 MIL/uL (ref 4.40–5.90)
RDW: 13.2 % (ref 11.5–14.5)
WBC: 9.8 10*3/uL (ref 3.8–10.6)

## 2017-11-08 LAB — LIPASE, BLOOD: Lipase: 24 U/L (ref 11–51)

## 2017-11-08 MED ORDER — DICYCLOMINE HCL 20 MG PO TABS: 20.0000 mg | ORAL_TABLET | Freq: Three times a day (TID) | ORAL | 0 refills | Status: DC | PRN

## 2017-11-08 MED ORDER — ONDANSETRON HCL 4 MG PO TABS: 4.0000 mg | ORAL_TABLET | Freq: Three times a day (TID) | ORAL | 0 refills | Status: DC | PRN

## 2017-11-08 MED ORDER — DICYCLOMINE HCL 10 MG PO CAPS
10.0000 mg | ORAL_CAPSULE | Freq: Once | ORAL | Status: AC
  Administered 2017-11-08: 10 mg via ORAL
  Filled 2017-11-08: qty 1

## 2017-11-08 MED ORDER — SODIUM CHLORIDE 0.9 % IV BOLUS
1000.0000 mL | Freq: Once | INTRAVENOUS | Status: AC
  Administered 2017-11-08: 1000 mL via INTRAVENOUS

## 2017-11-08 MED ORDER — ONDANSETRON HCL 4 MG/2ML IJ SOLN
4.0000 mg | Freq: Once | INTRAMUSCULAR | Status: AC
Start: 2017-11-08 — End: 2017-11-08
  Administered 2017-11-08: 4 mg via INTRAVENOUS
  Filled 2017-11-08: qty 2

## 2017-11-08 MED ORDER — PROCHLORPERAZINE EDISYLATE 10 MG/2ML IJ SOLN
10.0000 mg | Freq: Once | INTRAMUSCULAR | Status: AC
  Administered 2017-11-08: 10 mg via INTRAVENOUS
  Filled 2017-11-08: qty 2

## 2017-11-08 NOTE — ED Notes (Signed)
Patient denies nausea at this time. Patient states he feels better with NS infusion. Report given to April B. RN.

## 2017-11-08 NOTE — ED Provider Notes (Signed)
[facility] Emergency Department Provider Note   ____________________________________________   I have reviewed the triage vital signs and the nursing notes.   HISTORY  Chief Complaint Emesis   History limited by: Not Limited   HPI Gerald Ellis is a 52 y.o. male who presents to the emergency department today because of concerns for abdominal pain and emesis.  Patient states the abdominal pain started 3 days ago.  Located in the epigastric and central area.  He states it has been fairly constant.  It is now accompanied by vomiting.  He is unable to keep down oral intake or medications.  Family states he has been having issues with his stomach for some time.  He states he recently underwent endoscopy which showed inflammation to his stomach and esophagus.   Per medical record review patient has a history of recent gi visit with diagnosis of gastroparesis.   Past Medical History:  Diagnosis Date  . Benign enlargement of prostate   . Bronchitis   . Childhood asthma   . Diabetes mellitus without complication ([place])   . Environmental allergies   . Erectile dysfunction   . Headache   . Hemorrhoids   . Hypercholesteremia   . Hypertension   . Hypogonadism in male   . Lumbago   . Over weight     Patient Active Problem List   Diagnosis Date Noted  . Hypercholesterolemia 05/07/2015  . 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  . Hypogonadism in male 11/22/2014  . BPH with obstruction/lower urinary tract symptoms 11/22/2014  . DDD (degenerative disc disease), lumbar 10/13/2014  . Status post lumbar laminectomy 10/13/2014  . DJD of shoulder 10/13/2014  . Bilateral occipital neuralgia 10/13/2014  . Lumbar radiculopathy 10/13/2014  . DDD (degenerative disc disease), cervical 10/13/2014    Past Surgical History:  Procedure Laterality Date  . BACK SURGERY  1992  . CHOLECYSTECTOMY    . COLONOSCOPY WITH PROPOFOL N/A 08/07/2015   Procedure:  COLONOSCOPY WITH PROPOFOL;  Surgeon: Lollie Sails, MD;  Location: [facility] ENDOSCOPY;  Service: Endoscopy;  Laterality: N/A;  . COLONOSCOPY WITH PROPOFOL N/A 08/08/2015   Procedure: COLONOSCOPY WITH PROPOFOL;  Surgeon: Lollie Sails, MD;  Location: [facility] ENDOSCOPY;  Service: Endoscopy;  Laterality: N/A;  . COLONOSCOPY WITH PROPOFOL N/A 09/16/2017   Procedure: COLONOSCOPY WITH PROPOFOL;  Surgeon: Lollie Sails, MD;  Location: [facility] ENDOSCOPY;  Service: Endoscopy;  Laterality: N/A;  . ESOPHAGOGASTRODUODENOSCOPY (EGD) WITH PROPOFOL N/A 08/07/2015   Procedure: ESOPHAGOGASTRODUODENOSCOPY (EGD) WITH PROPOFOL;  Surgeon: Lollie Sails, MD;  Location: [facility] ENDOSCOPY;  Service: Endoscopy;  Laterality: N/A;  . ESOPHAGOGASTRODUODENOSCOPY (EGD) WITH PROPOFOL N/A 09/16/2017   Procedure: ESOPHAGOGASTRODUODENOSCOPY (EGD) WITH PROPOFOL;  Surgeon: Lollie Sails, MD;  Location: [facility] ENDOSCOPY;  Service: Endoscopy;  Laterality: N/A;    Prior to Admission medications   Medication Sig Start Date End Date Taking? Authorizing Provider  albuterol (PROAIR HFA) 108 (90 BASE) MCG/ACT inhaler Inhale into the lungs. 01/02/15 01/02/16  [provider]  cyclobenzaprine (FLEXERIL) 10 MG tablet Take 10 mg by mouth at bedtime.    [provider]  DHA-EPA-VITAMIN E PO Take 1 tablet by mouth daily.    [provider]  diclofenac sodium (VOLTAREN) 1 % GEL Apply 2-4 g topically 4 (four) times daily. Reported on 10/23/2015    [provider]  fluticasone (FLONASE) 50 MCG/ACT nasal spray Place 1 spray into both nostrils daily.    [provider]  gabapentin (NEURONTIN) 300 MG capsule  Take 300 mg by mouth at bedtime. 2 capsules at bedtime    [provider]  glipiZIDE (GLUCOTROL) 10 MG tablet Take 10 mg by mouth daily before breakfast. Reported on 10/23/2015    [provider]  hydrochlorothiazide (HYDRODIURIL) 12.5 MG tablet Take by mouth. 02/13/15 02/13/16  [provider]  HYDROcodone-acetaminophen (NORCO/VICODIN) 5-325 MG tablet Limit 1 tab by mouth per day or twice a day if tolerated 01/04/16   Mohammed Kindle, MD  insulin detemir (LEVEMIR) 100 UNIT/ML injection Inject 28 Units into the skin every morning. Reported on 10/23/2015    [provider]  insulin lispro (HUMALOG) 100 UNIT/ML injection Inject 20 Units into the skin 3 (three) times daily with meals. Reported on 10/23/2015    [provider]  insulin NPH Human (HUMULIN N,NOVOLIN N) 100 UNIT/ML injection Inject into the skin. 60 units in the a.m. And 70 units at night    [provider]  insulin regular (NOVOLIN R,HUMULIN R) 100 units/mL injection Reported on 05/03/2015 08/30/14   [provider]  lisinopril (PRINIVIL,ZESTRIL) 20 MG tablet Take 20 mg by mouth 2 (two) times daily.    [provider]  metFORMIN (GLUCOPHAGE) 1000 MG tablet Take 1,000 mg by mouth 1 day or 1 dose.     [provider]  Multiple Vitamins-Minerals (MULTIVITAMIN WITH MINERALS) tablet Take 1 tablet by mouth daily.    [provider]  ondansetron (ZOFRAN) 4 MG tablet Take 1 tablet (4 mg total) by mouth daily as needed for nausea or vomiting. 06/12/15   Lavonia Drafts, MD  pantoprazole (PROTONIX) 40 MG tablet TAKE 1 TABLET BY MOUTH EVERY DAY 04/07/15   [provider]  pioglitazone (ACTOS) 45 MG tablet Take 45 mg by mouth daily. Reported on 10/23/2015    [provider]  simvastatin (ZOCOR) 40 MG tablet Take 40 mg by mouth daily.    [provider]  testosterone cypionate (DEPOTESTOTERONE CYPIONATE) 200 MG/ML injection Inject 200 mg into the muscle every 21 ( twenty-one) days. Reported on 10/23/2015    [provider]  traMADol Veatrice Bourbon) 50 MG tablet Limit 1 tab by mouth a day or twice a day if tolerated 01/04/16   Mohammed Kindle, MD    Allergies Ibuprofen and Nsaids  Family History  Problem Relation Age of Onset  . Heart disease  Mother        CABG  . Cancer Mother        Disseminated  . Hypertension Mother   . Hyperlipidemia Mother   . Diabetes Father        mother  . Alzheimer's disease Unknown   . Kidney disease Neg Hx   . Prostate cancer Neg Hx     Social History Social History   Tobacco Use  . Smoking status: Never Smoker  . Smokeless tobacco: Never Used  Substance Use Topics  . Alcohol use: Yes    Alcohol/week: 0.0 oz    Comment: occasionally  . Drug use: No    Review of Systems Constitutional: No fever/chills Eyes: No visual changes. ENT: No sore throat. Cardiovascular: Denies chest pain. Respiratory: Denies shortness of breath. Gastrointestinal: Positive for abdominal pain, nausea and vomiting.  Genitourinary: Negative for dysuria. Musculoskeletal: Negative for back pain. Skin: Negative for rash. Neurological: Negative for headaches, focal weakness or numbness.  ____________________________________________   PHYSICAL EXAM:  VITAL SIGNS: ED Triage Vitals  Enc Vitals Group     BP 11/08/17 2049 (!) 161/106     Pulse  Rate 11/08/17 2049 (!) 106     Resp 11/08/17 2049 16     Temp 11/08/17 2049 98.1 F (36.7 C)     Temp Source 11/08/17 2049 Oral     SpO2 11/08/17 2049 100 %     Weight 11/08/17 2050 215 lb (97.5 kg)     Height 11/08/17 2050 6' (1.829 m)     Head Circumference --      Peak Flow --      Pain Score 11/08/17 2049 8   Constitutional: Alert and oriented.  Eyes: Conjunctivae are normal.  ENT      Head: Normocephalic and atraumatic.      Nose: No congestion/rhinnorhea.      Mouth/Throat: Mucous membranes are moist.      Neck: No stridor. Hematological/Lymphatic/Immunilogical: No cervical lymphadenopathy. Cardiovascular: Normal rate, regular rhythm.  No murmurs, rubs, or gallops.  Respiratory: Normal respiratory effort without tachypnea nor retractions. Breath sounds are clear and equal bilaterally. No wheezes/rales/rhonchi. Gastrointestinal: Soft and minimally  tender in the central abdomen.  Genitourinary: Deferred Musculoskeletal: Normal range of motion in all extremities. No lower extremity edema. Neurologic:  Normal speech and language. No gross focal neurologic deficits are appreciated.  Skin:  Skin is warm, dry and intact. No rash noted. Psychiatric: Mood and affect are normal. Speech and behavior are normal. Patient exhibits appropriate insight and judgment.  ____________________________________________    LABS (pertinent positives/negatives)  CBC wbc 9.8, hgb 15.9, plt 229 Lipase 24 CMP na 134, glu 255, cr 0.63 ____________________________________________   EKG  None  ____________________________________________    RADIOLOGY  None  ____________________________________________   PROCEDURES  Procedures  ____________________________________________   INITIAL IMPRESSION / ASSESSMENT AND PLAN / ED COURSE  Pertinent labs & imaging results that were available during my care of the patient were reviewed by me and considered in my medical decision making (see chart for details).   Patient presented to the emergency department today with primary complaints of abdominal pain nausea and vomiting.  Differential would be broad including gastroparesis, gastritis, gallbladder disease, gastroenteritis amongst other etiologies.  Initial blood work without concerning findings.  Patient was given some medication which did not significantly improve.  Upon reassessment however patient also complained of being quite upset after an argument with his father couple of days ago.  He states that is kind of when the symptoms started.  He does complain of some neck pain.  Will add on a troponin and give further medications to help alleviate symptoms.  If patient's troponin is negative and patient improved  would anticipate discharge.   ____________________________________________   FINAL CLINICAL IMPRESSION(S) / ED DIAGNOSES  Final diagnoses:   Abdominal pain, unspecified abdominal location  Nausea and vomiting, intractability of vomiting not specified, unspecified vomiting type     Note: This dictation was prepared with Dragon dictation. Any transcriptional errors that result from this process are unintentional     Nance Pear, MD 11/08/17 2351

## 2017-11-08 NOTE — Discharge Instructions (Signed)
Please seek medical attention for any high fevers, chest pain, shortness of breath, change in behavior, persistent vomiting, bloody stool or any other new or concerning symptoms.  

## 2017-11-08 NOTE — ED Triage Notes (Signed)
Pt to ED from home c/o nausea and vomiting since Wednesday, denies diarrhea, states center abd pain, last BM today that was normal, states feeling diaphoretic and chills intermittently.  Worse after eating and drinking.

## 2017-11-08 NOTE — ED Notes (Signed)
Report from sonja, rn.  

## 2017-11-09 LAB — TROPONIN I

## 2018-05-22 ENCOUNTER — Other Ambulatory Visit: Payer: Self-pay

## 2018-05-22 ENCOUNTER — Emergency Department (HOSPITAL_COMMUNITY)
Admission: EM | Admit: 2018-05-22 | Discharge: 2018-05-22 | Disposition: A | Discharge status: Home / Self Care | Payer: Medicare HMO | Attending: Emergency Medicine | Admitting: Emergency Medicine

## 2018-05-22 ENCOUNTER — Encounter (HOSPITAL_COMMUNITY): Payer: Self-pay | Admitting: Emergency Medicine

## 2018-05-22 DIAGNOSIS — R112 Nausea with vomiting, unspecified: Secondary | ICD-10-CM | POA: Insufficient documentation

## 2018-05-22 DIAGNOSIS — Z5321 Procedure and treatment not carried out due to patient leaving prior to being seen by health care provider: Secondary | ICD-10-CM | POA: Diagnosis not present

## 2018-05-22 LAB — COMPREHENSIVE METABOLIC PANEL
ALT: 19 U/L (ref 0–44)
AST: 18 U/L (ref 15–41)
Albumin: 4 g/dL (ref 3.5–5.0)
Alkaline Phosphatase: 79 U/L (ref 38–126)
Anion gap: 12 (ref 5–15)
BUN: 10 mg/dL (ref 6–20)
CHLORIDE: 98 mmol/L (ref 98–111)
CO2: 26 mmol/L (ref 22–32)
Calcium: 9.2 mg/dL (ref 8.9–10.3)
Creatinine, Ser: 1.47 mg/dL — ABNORMAL HIGH (ref 0.61–1.24)
GFR, EST NON AFRICAN AMERICAN: 54 mL/min — AB (ref >60)
Glucose, Bld: 406 mg/dL — ABNORMAL HIGH (ref 70–99)
POTASSIUM: 3.4 mmol/L — AB (ref 3.5–5.1)
Sodium: 136 mmol/L (ref 135–145)
Total Bilirubin: 1.6 mg/dL — ABNORMAL HIGH (ref 0.3–1.2)
Total Protein: 7.1 g/dL (ref 6.5–8.1)

## 2018-05-22 LAB — CBG MONITORING, ED: GLUCOSE-CAPILLARY: 380 mg/dL — AB (ref 70–99)

## 2018-05-22 LAB — CBC
HEMATOCRIT: 46.9 % (ref 39.0–52.0)
HEMOGLOBIN: 16.6 g/dL (ref 13.0–17.0)
MCH: 29.9 pg (ref 26.0–34.0)
MCHC: 35.4 g/dL (ref 30.0–36.0)
MCV: 84.5 fL (ref 80.0–100.0)
NRBC: 0 % (ref 0.0–0.2)
Platelets: 285 10*3/uL (ref 150–400)
RBC: 5.55 MIL/uL (ref 4.22–5.81)
RDW: 11.6 % (ref 11.5–15.5)
WBC: 15.8 10*3/uL — AB (ref 4.0–10.5)

## 2018-05-22 LAB — LIPASE, BLOOD: LIPASE: 22 U/L (ref 11–51)

## 2018-05-22 MED ORDER — ONDANSETRON 4 MG PO TBDP
4.0000 mg | ORAL_TABLET | Freq: Once | ORAL | Status: DC | PRN
  Filled 2018-05-22: qty 1

## 2018-05-22 NOTE — ED Notes (Signed)
Pt didn't answer for vitals recheck

## 2018-05-22 NOTE — ED Triage Notes (Signed)
Pt reports eating a bologna sandwhich at 2130 last evening. Pt denies any symptoms prior to same.

## 2018-05-22 NOTE — ED Notes (Signed)
PT Stated he is Type 2 Diabetic and would like sugar checked.

## 2018-05-29 ENCOUNTER — Ambulatory Visit
Admission: RE | Admit: 2018-05-29 | Discharge: 2018-05-29 | Disposition: A | Discharge status: Home / Self Care | Payer: Medicare HMO | Source: Ambulatory Visit | Attending: Gastroenterology | Admitting: Gastroenterology

## 2018-05-29 ENCOUNTER — Other Ambulatory Visit: Payer: Self-pay | Admitting: Gastroenterology

## 2018-05-29 ENCOUNTER — Encounter (INDEPENDENT_AMBULATORY_CARE_PROVIDER_SITE_OTHER): Payer: Self-pay

## 2018-05-29 DIAGNOSIS — K210 Gastro-esophageal reflux disease with esophagitis: Secondary | ICD-10-CM | POA: Diagnosis not present

## 2018-05-29 DIAGNOSIS — K5730 Diverticulosis of large intestine without perforation or abscess without bleeding: Secondary | ICD-10-CM | POA: Diagnosis not present

## 2018-05-29 DIAGNOSIS — E1143 Type 2 diabetes mellitus with diabetic autonomic (poly)neuropathy: Secondary | ICD-10-CM | POA: Diagnosis not present

## 2018-05-29 DIAGNOSIS — R1032 Left lower quadrant pain: Secondary | ICD-10-CM | POA: Diagnosis not present

## 2018-05-29 DIAGNOSIS — K3184 Gastroparesis: Secondary | ICD-10-CM | POA: Diagnosis not present

## 2018-05-29 MED ORDER — IOHEXOL 300 MG/ML  SOLN
80.0000 mL | Freq: Once | INTRAMUSCULAR | Status: AC | PRN
  Administered 2018-05-29: 80 mL via INTRAVENOUS

## 2018-06-01 ENCOUNTER — Ambulatory Visit
Admission: RE | Admit: 2018-06-01 | Discharge: 2018-06-01 | Disposition: A | Discharge status: Home / Self Care | Payer: Medicare HMO | Source: Ambulatory Visit | Attending: Gastroenterology | Admitting: Gastroenterology

## 2018-06-01 ENCOUNTER — Other Ambulatory Visit: Payer: Self-pay | Admitting: Gastroenterology

## 2018-06-01 ENCOUNTER — Ambulatory Visit
Admission: RE | Admit: 2018-06-01 | Discharge: 2018-06-01 | Disposition: A | Discharge status: Home / Self Care | Payer: Medicare HMO | Attending: Gastroenterology | Admitting: Gastroenterology

## 2018-06-01 DIAGNOSIS — K529 Noninfective gastroenteritis and colitis, unspecified: Secondary | ICD-10-CM

## 2018-06-01 DIAGNOSIS — K5730 Diverticulosis of large intestine without perforation or abscess without bleeding: Secondary | ICD-10-CM | POA: Diagnosis not present

## 2018-06-03 DIAGNOSIS — M5136 Other intervertebral disc degeneration, lumbar region: Secondary | ICD-10-CM | POA: Diagnosis not present

## 2018-06-03 DIAGNOSIS — M5030 Other cervical disc degeneration, unspecified cervical region: Secondary | ICD-10-CM | POA: Diagnosis not present

## 2018-06-03 DIAGNOSIS — G039 Meningitis, unspecified: Secondary | ICD-10-CM | POA: Diagnosis not present

## 2018-06-03 DIAGNOSIS — Z5181 Encounter for therapeutic drug level monitoring: Secondary | ICD-10-CM | POA: Diagnosis not present

## 2018-06-05 DIAGNOSIS — K210 Gastro-esophageal reflux disease with esophagitis: Secondary | ICD-10-CM | POA: Diagnosis not present

## 2018-06-05 DIAGNOSIS — E1143 Type 2 diabetes mellitus with diabetic autonomic (poly)neuropathy: Secondary | ICD-10-CM | POA: Diagnosis not present

## 2018-06-05 DIAGNOSIS — K529 Noninfective gastroenteritis and colitis, unspecified: Secondary | ICD-10-CM | POA: Diagnosis not present

## 2018-06-05 DIAGNOSIS — K3184 Gastroparesis: Secondary | ICD-10-CM | POA: Diagnosis not present

## 2018-07-01 DIAGNOSIS — M791 Myalgia, unspecified site: Secondary | ICD-10-CM | POA: Diagnosis not present

## 2018-07-01 DIAGNOSIS — R202 Paresthesia of skin: Secondary | ICD-10-CM | POA: Diagnosis not present

## 2018-07-01 DIAGNOSIS — I10 Essential (primary) hypertension: Secondary | ICD-10-CM | POA: Diagnosis not present

## 2018-07-01 DIAGNOSIS — G894 Chronic pain syndrome: Secondary | ICD-10-CM | POA: Diagnosis not present

## 2018-07-01 DIAGNOSIS — E785 Hyperlipidemia, unspecified: Secondary | ICD-10-CM | POA: Diagnosis not present

## 2018-07-01 DIAGNOSIS — R197 Diarrhea, unspecified: Secondary | ICD-10-CM | POA: Diagnosis not present

## 2018-07-29 DIAGNOSIS — G039 Meningitis, unspecified: Secondary | ICD-10-CM | POA: Diagnosis not present

## 2018-07-29 DIAGNOSIS — Z5181 Encounter for therapeutic drug level monitoring: Secondary | ICD-10-CM | POA: Diagnosis not present

## 2018-07-29 DIAGNOSIS — M5030 Other cervical disc degeneration, unspecified cervical region: Secondary | ICD-10-CM | POA: Diagnosis not present

## 2018-07-29 DIAGNOSIS — M5136 Other intervertebral disc degeneration, lumbar region: Secondary | ICD-10-CM | POA: Diagnosis not present

## 2018-08-26 DIAGNOSIS — M5136 Other intervertebral disc degeneration, lumbar region: Secondary | ICD-10-CM | POA: Diagnosis not present

## 2018-08-26 DIAGNOSIS — M5030 Other cervical disc degeneration, unspecified cervical region: Secondary | ICD-10-CM | POA: Diagnosis not present

## 2018-08-26 DIAGNOSIS — G039 Meningitis, unspecified: Secondary | ICD-10-CM | POA: Diagnosis not present

## 2018-08-26 DIAGNOSIS — Z5181 Encounter for therapeutic drug level monitoring: Secondary | ICD-10-CM | POA: Diagnosis not present

## 2018-09-21 DIAGNOSIS — Z0000 Encounter for general adult medical examination without abnormal findings: Secondary | ICD-10-CM | POA: Diagnosis not present

## 2018-09-21 DIAGNOSIS — E785 Hyperlipidemia, unspecified: Secondary | ICD-10-CM | POA: Diagnosis not present

## 2018-09-21 DIAGNOSIS — Z794 Long term (current) use of insulin: Secondary | ICD-10-CM | POA: Diagnosis not present

## 2018-09-21 DIAGNOSIS — I10 Essential (primary) hypertension: Secondary | ICD-10-CM | POA: Diagnosis not present

## 2018-09-21 DIAGNOSIS — E119 Type 2 diabetes mellitus without complications: Secondary | ICD-10-CM | POA: Diagnosis not present

## 2018-09-23 DIAGNOSIS — M5030 Other cervical disc degeneration, unspecified cervical region: Secondary | ICD-10-CM | POA: Diagnosis not present

## 2018-09-23 DIAGNOSIS — Z5181 Encounter for therapeutic drug level monitoring: Secondary | ICD-10-CM | POA: Diagnosis not present

## 2018-09-23 DIAGNOSIS — G039 Meningitis, unspecified: Secondary | ICD-10-CM | POA: Diagnosis not present

## 2018-09-23 DIAGNOSIS — M5136 Other intervertebral disc degeneration, lumbar region: Secondary | ICD-10-CM | POA: Diagnosis not present

## 2018-10-21 DIAGNOSIS — M19019 Primary osteoarthritis, unspecified shoulder: Secondary | ICD-10-CM | POA: Diagnosis not present

## 2018-10-21 DIAGNOSIS — Z5181 Encounter for therapeutic drug level monitoring: Secondary | ICD-10-CM | POA: Diagnosis not present

## 2018-10-21 DIAGNOSIS — G039 Meningitis, unspecified: Secondary | ICD-10-CM | POA: Diagnosis not present

## 2018-10-21 DIAGNOSIS — M5481 Occipital neuralgia: Secondary | ICD-10-CM | POA: Diagnosis not present

## 2018-10-21 DIAGNOSIS — G894 Chronic pain syndrome: Secondary | ICD-10-CM | POA: Diagnosis not present

## 2018-10-21 DIAGNOSIS — M5030 Other cervical disc degeneration, unspecified cervical region: Secondary | ICD-10-CM | POA: Diagnosis not present

## 2018-10-21 DIAGNOSIS — M5136 Other intervertebral disc degeneration, lumbar region: Secondary | ICD-10-CM | POA: Diagnosis not present

## 2018-11-05 DIAGNOSIS — K3184 Gastroparesis: Secondary | ICD-10-CM | POA: Diagnosis not present

## 2018-11-05 DIAGNOSIS — E1143 Type 2 diabetes mellitus with diabetic autonomic (poly)neuropathy: Secondary | ICD-10-CM | POA: Diagnosis not present

## 2018-11-05 DIAGNOSIS — K210 Gastro-esophageal reflux disease with esophagitis: Secondary | ICD-10-CM | POA: Diagnosis not present

## 2018-11-05 DIAGNOSIS — K529 Noninfective gastroenteritis and colitis, unspecified: Secondary | ICD-10-CM | POA: Diagnosis not present

## 2019-03-18 DIAGNOSIS — R079 Chest pain, unspecified: Secondary | ICD-10-CM | POA: Insufficient documentation

## 2019-03-18 DIAGNOSIS — R9431 Abnormal electrocardiogram [ECG] [EKG]: Secondary | ICD-10-CM | POA: Insufficient documentation

## 2019-03-18 DIAGNOSIS — R0602 Shortness of breath: Secondary | ICD-10-CM | POA: Insufficient documentation

## 2019-04-16 ENCOUNTER — Other Ambulatory Visit
Admission: RE | Admit: 2019-04-16 | Discharge: 2019-04-16 | Disposition: A | Discharge status: Home / Self Care | Payer: Medicare Other | Source: Ambulatory Visit | Attending: Cardiology | Admitting: Cardiology

## 2019-04-16 ENCOUNTER — Other Ambulatory Visit: Payer: Self-pay

## 2019-04-16 DIAGNOSIS — Z20828 Contact with and (suspected) exposure to other viral communicable diseases: Secondary | ICD-10-CM | POA: Insufficient documentation

## 2019-04-16 DIAGNOSIS — Z01812 Encounter for preprocedural laboratory examination: Secondary | ICD-10-CM | POA: Diagnosis present

## 2019-04-17 LAB — SARS CORONAVIRUS 2 (TAT 6-24 HRS): SARS Coronavirus 2: NEGATIVE

## 2019-04-20 ENCOUNTER — Other Ambulatory Visit: Payer: Self-pay

## 2019-04-20 ENCOUNTER — Encounter
Admission: RE | Disposition: A | Discharge status: Home / Self Care | Payer: Self-pay | Source: Home / Self Care | Attending: Cardiology

## 2019-04-20 ENCOUNTER — Observation Stay
Admission: RE | Admit: 2019-04-20 | Discharge: 2019-04-21 | Disposition: A | Discharge status: Home / Self Care | Payer: Medicare Other | Attending: Cardiology | Admitting: Cardiology

## 2019-04-20 DIAGNOSIS — R9430 Abnormal result of cardiovascular function study, unspecified: Secondary | ICD-10-CM

## 2019-04-20 DIAGNOSIS — I2582 Chronic total occlusion of coronary artery: Secondary | ICD-10-CM | POA: Insufficient documentation

## 2019-04-20 DIAGNOSIS — E7800 Pure hypercholesterolemia, unspecified: Secondary | ICD-10-CM | POA: Insufficient documentation

## 2019-04-20 DIAGNOSIS — I10 Essential (primary) hypertension: Secondary | ICD-10-CM | POA: Diagnosis not present

## 2019-04-20 DIAGNOSIS — I25118 Atherosclerotic heart disease of native coronary artery with other forms of angina pectoris: Principal | ICD-10-CM | POA: Insufficient documentation

## 2019-04-20 DIAGNOSIS — I2510 Atherosclerotic heart disease of native coronary artery without angina pectoris: Secondary | ICD-10-CM | POA: Diagnosis present

## 2019-04-20 HISTORY — PX: CORONARY STENT INTERVENTION: CATH118234

## 2019-04-20 HISTORY — PX: LEFT HEART CATH AND CORONARY ANGIOGRAPHY: CATH118249

## 2019-04-20 LAB — GLUCOSE, CAPILLARY
Glucose-Capillary: 121 mg/dL — ABNORMAL HIGH (ref 70–99)
Glucose-Capillary: 213 mg/dL — ABNORMAL HIGH (ref 70–99)
Glucose-Capillary: 243 mg/dL — ABNORMAL HIGH (ref 70–99)

## 2019-04-20 LAB — POCT ACTIVATED CLOTTING TIME
Activated Clotting Time: 224 seconds
Activated Clotting Time: 235 seconds
Activated Clotting Time: 263 seconds
Activated Clotting Time: 318 seconds

## 2019-04-20 SURGERY — LEFT HEART CATH AND CORONARY ANGIOGRAPHY
Anesthesia: Moderate Sedation

## 2019-04-20 MED ORDER — MIDAZOLAM HCL 2 MG/2ML IJ SOLN
INTRAMUSCULAR | Status: AC
  Filled 2019-04-20: qty 2

## 2019-04-20 MED ORDER — SODIUM CHLORIDE 0.9 % WEIGHT BASED INFUSION: 1.0000 mL/kg/h | INTRAVENOUS | Status: DC

## 2019-04-20 MED ORDER — INSULIN REGULAR HUMAN 100 UNIT/ML IJ SOLN: 30.0000 [IU] | Freq: Three times a day (TID) | INTRAMUSCULAR | Status: DC

## 2019-04-20 MED ORDER — SODIUM CHLORIDE 0.9% FLUSH: 3.0000 mL | INTRAVENOUS | Status: DC | PRN

## 2019-04-20 MED ORDER — HEPARIN SODIUM (PORCINE) 1000 UNIT/ML IJ SOLN
INTRAMUSCULAR | Status: AC
  Filled 2019-04-20: qty 1

## 2019-04-20 MED ORDER — SODIUM CHLORIDE 0.9 % WEIGHT BASED INFUSION
1.0000 mL/kg/h | INTRAVENOUS | Status: AC
  Administered 2019-04-20 (×2): 1 mL/kg/h via INTRAVENOUS

## 2019-04-20 MED ORDER — PANTOPRAZOLE SODIUM 40 MG PO TBEC
40.0000 mg | DELAYED_RELEASE_TABLET | Freq: Two times a day (BID) | ORAL | Status: DC
  Administered 2019-04-20 – 2019-04-21 (×2): 40 mg via ORAL
  Filled 2019-04-20 (×2): qty 1

## 2019-04-20 MED ORDER — ASPIRIN 81 MG PO CHEW
CHEWABLE_TABLET | ORAL | Status: DC | PRN
  Administered 2019-04-20: 81 mg via ORAL
  Administered 2019-04-20: 243 mg via ORAL

## 2019-04-20 MED ORDER — VERAPAMIL HCL 2.5 MG/ML IV SOLN
INTRAVENOUS | Status: AC
  Filled 2019-04-20: qty 2

## 2019-04-20 MED ORDER — ASPIRIN 81 MG PO CHEW
CHEWABLE_TABLET | ORAL | Status: AC
  Filled 2019-04-20: qty 3

## 2019-04-20 MED ORDER — MIDAZOLAM HCL 2 MG/2ML IJ SOLN
INTRAMUSCULAR | Status: DC | PRN
  Administered 2019-04-20 (×4): 1 mg via INTRAVENOUS

## 2019-04-20 MED ORDER — SIMVASTATIN 20 MG PO TABS
40.0000 mg | ORAL_TABLET | Freq: Every day | ORAL | Status: DC
  Administered 2019-04-20: 40 mg via ORAL
  Filled 2019-04-20: qty 1

## 2019-04-20 MED ORDER — SODIUM CHLORIDE 0.9% FLUSH
3.0000 mL | Freq: Two times a day (BID) | INTRAVENOUS | Status: DC
  Administered 2019-04-21: 3 mL via INTRAVENOUS

## 2019-04-20 MED ORDER — ASPIRIN 81 MG PO CHEW
CHEWABLE_TABLET | ORAL | Status: AC
  Filled 2019-04-20: qty 1

## 2019-04-20 MED ORDER — FENTANYL CITRATE (PF) 100 MCG/2ML IJ SOLN
INTRAMUSCULAR | Status: DC | PRN
  Administered 2019-04-20 (×2): 50 ug via INTRAVENOUS
  Administered 2019-04-20 (×3): 25 ug via INTRAVENOUS

## 2019-04-20 MED ORDER — FENTANYL CITRATE (PF) 100 MCG/2ML IJ SOLN
INTRAMUSCULAR | Status: AC
  Filled 2019-04-20: qty 2

## 2019-04-20 MED ORDER — HEPARIN (PORCINE) IN NACL 1000-0.9 UT/500ML-% IV SOLN
INTRAVENOUS | Status: AC
  Filled 2019-04-20: qty 1000

## 2019-04-20 MED ORDER — PRASUGREL HCL 10 MG PO TABS
ORAL_TABLET | ORAL | Status: AC
  Filled 2019-04-20: qty 6

## 2019-04-20 MED ORDER — ASPIRIN 81 MG PO CHEW
81.0000 mg | CHEWABLE_TABLET | Freq: Every day | ORAL | Status: DC
  Administered 2019-04-21: 81 mg via ORAL
  Filled 2019-04-20: qty 1

## 2019-04-20 MED ORDER — NITROGLYCERIN 1 MG/10 ML FOR IR/CATH LAB
INTRA_ARTERIAL | Status: DC | PRN
  Administered 2019-04-20: 200 ug via INTRACORONARY

## 2019-04-20 MED ORDER — GABAPENTIN 600 MG PO TABS
600.0000 mg | ORAL_TABLET | Freq: Every day | ORAL | Status: DC
  Administered 2019-04-20: 600 mg via ORAL
  Filled 2019-04-20: qty 1

## 2019-04-20 MED ORDER — METOPROLOL SUCCINATE ER 50 MG PO TB24
50.0000 mg | ORAL_TABLET | Freq: Every day | ORAL | Status: DC
  Administered 2019-04-20 – 2019-04-21 (×2): 50 mg via ORAL
  Filled 2019-04-20 (×2): qty 1

## 2019-04-20 MED ORDER — PRASUGREL HCL 10 MG PO TABS
ORAL_TABLET | ORAL | Status: DC | PRN
  Administered 2019-04-20: 60 mg via ORAL

## 2019-04-20 MED ORDER — BUSPIRONE HCL 15 MG PO TABS
7.5000 mg | ORAL_TABLET | Freq: Three times a day (TID) | ORAL | Status: DC
  Administered 2019-04-20 – 2019-04-21 (×3): 7.5 mg via ORAL
  Filled 2019-04-20 (×5): qty 1

## 2019-04-20 MED ORDER — ONDANSETRON HCL 4 MG/2ML IJ SOLN: 4.0000 mg | Freq: Four times a day (QID) | INTRAMUSCULAR | Status: DC | PRN

## 2019-04-20 MED ORDER — INSULIN ASPART 100 UNIT/ML ~~LOC~~ SOLN
30.0000 [IU] | Freq: Three times a day (TID) | SUBCUTANEOUS | Status: DC
  Administered 2019-04-21: 30 [IU] via SUBCUTANEOUS
  Filled 2019-04-20: qty 1

## 2019-04-20 MED ORDER — CLOPIDOGREL BISULFATE 75 MG PO TABS
75.0000 mg | ORAL_TABLET | Freq: Every day | ORAL | Status: DC
  Administered 2019-04-21: 75 mg via ORAL
  Filled 2019-04-20: qty 1

## 2019-04-20 MED ORDER — ACETAMINOPHEN 325 MG PO TABS
650.0000 mg | ORAL_TABLET | ORAL | Status: DC | PRN
  Administered 2019-04-20: 650 mg via ORAL

## 2019-04-20 MED ORDER — LISINOPRIL 20 MG PO TABS
20.0000 mg | ORAL_TABLET | Freq: Every day | ORAL | Status: DC
  Administered 2019-04-20 – 2019-04-21 (×2): 20 mg via ORAL
  Filled 2019-04-20 (×2): qty 1

## 2019-04-20 MED ORDER — PIOGLITAZONE HCL 30 MG PO TABS
30.0000 mg | ORAL_TABLET | Freq: Every day | ORAL | Status: DC
  Administered 2019-04-20 – 2019-04-21 (×2): 30 mg via ORAL
  Filled 2019-04-20 (×2): qty 1

## 2019-04-20 MED ORDER — SODIUM CHLORIDE 0.9% FLUSH
3.0000 mL | Freq: Two times a day (BID) | INTRAVENOUS | Status: DC
  Administered 2019-04-20 – 2019-04-21 (×2): 3 mL via INTRAVENOUS

## 2019-04-20 MED ORDER — HEPARIN SODIUM (PORCINE) 1000 UNIT/ML IJ SOLN
INTRAMUSCULAR | Status: DC | PRN
  Administered 2019-04-20: 4000 [IU] via INTRAVENOUS
  Administered 2019-04-20: 5000 [IU] via INTRAVENOUS
  Administered 2019-04-20: 7000 [IU] via INTRAVENOUS
  Administered 2019-04-20: 4000 [IU] via INTRAVENOUS
  Administered 2019-04-20: 5000 [IU] via INTRAVENOUS

## 2019-04-20 MED ORDER — HYDROCODONE-ACETAMINOPHEN 5-325 MG PO TABS
1.0000 | ORAL_TABLET | ORAL | Status: DC | PRN
  Administered 2019-04-20 – 2019-04-21 (×2): 1 via ORAL
  Filled 2019-04-20 (×2): qty 1

## 2019-04-20 MED ORDER — IOHEXOL 300 MG/ML  SOLN
INTRAMUSCULAR | Status: DC | PRN
  Administered 2019-04-20: 330 mL

## 2019-04-20 MED ORDER — AMLODIPINE BESYLATE 5 MG PO TABS
5.0000 mg | ORAL_TABLET | Freq: Every day | ORAL | Status: DC
  Administered 2019-04-20 – 2019-04-21 (×2): 5 mg via ORAL
  Filled 2019-04-20 (×2): qty 1

## 2019-04-20 MED ORDER — HYDRALAZINE HCL 20 MG/ML IJ SOLN: 10.0000 mg | INTRAMUSCULAR | Status: AC | PRN

## 2019-04-20 MED ORDER — LABETALOL HCL 5 MG/ML IV SOLN: 10.0000 mg | INTRAVENOUS | Status: AC | PRN

## 2019-04-20 MED ORDER — HEPARIN (PORCINE) IN NACL 1000-0.9 UT/500ML-% IV SOLN
INTRAVENOUS | Status: DC | PRN
  Administered 2019-04-20: 1000 mL

## 2019-04-20 MED ORDER — SODIUM CHLORIDE 0.9 % IV SOLN: 250.0000 mL | INTRAVENOUS | Status: DC | PRN

## 2019-04-20 MED ORDER — NITROGLYCERIN 1 MG/10 ML FOR IR/CATH LAB
INTRA_ARTERIAL | Status: AC
  Filled 2019-04-20: qty 10

## 2019-04-20 MED ORDER — SODIUM CHLORIDE 0.9 % WEIGHT BASED INFUSION
3.0000 mL/kg/h | INTRAVENOUS | Status: AC
  Administered 2019-04-20: 3 mL/kg/h via INTRAVENOUS

## 2019-04-20 MED ORDER — ACETAMINOPHEN 325 MG PO TABS
ORAL_TABLET | ORAL | Status: AC
  Administered 2019-04-20: 15:00:00
  Filled 2019-04-20: qty 2

## 2019-04-20 MED ORDER — TRAMADOL HCL 50 MG PO TABS
50.0000 mg | ORAL_TABLET | Freq: Two times a day (BID) | ORAL | Status: DC
  Administered 2019-04-20 – 2019-04-21 (×3): 50 mg via ORAL
  Filled 2019-04-20 (×3): qty 1

## 2019-04-20 SURGICAL SUPPLY — 19 items
BALLN TREK RX 2.25X20 (BALLOONS) ×3
BALLN ~~LOC~~ EUPHORA RX 2.75X20 (BALLOONS) ×3
BALLOON TREK RX 2.25X20 (BALLOONS) IMPLANT
BALLOON ~~LOC~~ EUPHORA RX 2.75X20 (BALLOONS) IMPLANT
CATH 5F 110X4 TIG (CATHETERS) ×2 IMPLANT
CATH INFINITI 5FR ANG PIGTAIL (CATHETERS) ×2 IMPLANT
CATH VISTA GUIDE 6FR XB3.5 (CATHETERS) ×1 IMPLANT
DEVICE INFLAT 30 PLUS (MISCELLANEOUS) ×1 IMPLANT
DEVICE RAD TR BAND REGULAR (VASCULAR PRODUCTS) ×1 IMPLANT
GLIDESHEATH SLEND SS 6F .021 (SHEATH) ×1 IMPLANT
GUIDELINER 6F (CATHETERS) ×1 IMPLANT
KIT MANI 3VAL PERCEP (MISCELLANEOUS) ×3 IMPLANT
PACK CARDIAC CATH (CUSTOM PROCEDURE TRAY) ×3 IMPLANT
STENT RESOLUTE ONYX 2.5X15 (Permanent Stent) ×1 IMPLANT
STENT RESOLUTE ONYX 2.5X26 (Permanent Stent) ×2 IMPLANT
STENT RESOLUTE ONYX 2.75X12 (Permanent Stent) ×2 IMPLANT
WIRE ASAHI PROWATER 180CM (WIRE) ×1 IMPLANT
WIRE G HI TQ BMW 190 (WIRE) ×1 IMPLANT
WIRE ROSEN-J .035X260CM (WIRE) ×2 IMPLANT

## 2019-04-21 DIAGNOSIS — I25118 Atherosclerotic heart disease of native coronary artery with other forms of angina pectoris: Secondary | ICD-10-CM | POA: Diagnosis not present

## 2019-04-21 DIAGNOSIS — I2510 Atherosclerotic heart disease of native coronary artery without angina pectoris: Secondary | ICD-10-CM

## 2019-04-21 LAB — BASIC METABOLIC PANEL
Anion gap: 8 (ref 5–15)
BUN: 12 mg/dL (ref 6–20)
CO2: 25 mmol/L (ref 22–32)
Calcium: 8.4 mg/dL — ABNORMAL LOW (ref 8.9–10.3)
Chloride: 103 mmol/L (ref 98–111)
Creatinine, Ser: 0.64 mg/dL (ref 0.61–1.24)
GFR calc Af Amer: >60 mL/min (ref >60)
GFR calc non Af Amer: >60 mL/min (ref >60)
Glucose, Bld: 229 mg/dL — ABNORMAL HIGH (ref 70–99)
Potassium: 3.8 mmol/L (ref 3.5–5.1)
Sodium: 136 mmol/L (ref 135–145)

## 2019-04-21 LAB — CBC
HCT: 37.1 % — ABNORMAL LOW (ref 39.0–52.0)
Hemoglobin: 13.4 g/dL (ref 13.0–17.0)
MCH: 30.1 pg (ref 26.0–34.0)
MCHC: 36.1 g/dL — ABNORMAL HIGH (ref 30.0–36.0)
MCV: 83.4 fL (ref 80.0–100.0)
Platelets: 220 10*3/uL (ref 150–400)
RBC: 4.45 MIL/uL (ref 4.22–5.81)
RDW: 12.5 % (ref 11.5–15.5)
WBC: 10.1 10*3/uL (ref 4.0–10.5)
nRBC: 0 % (ref 0.0–0.2)

## 2019-04-21 MED ORDER — PRASUGREL HCL 10 MG PO TABS: 10.0000 mg | ORAL_TABLET | Freq: Every day | ORAL | Status: DC

## 2019-04-21 MED ORDER — SIMVASTATIN 80 MG PO TABS: 80.0000 mg | ORAL_TABLET | Freq: Every day | ORAL | 11 refills | Status: DC

## 2019-04-21 MED ORDER — PRASUGREL HCL 10 MG PO TABS
10.0000 mg | ORAL_TABLET | Freq: Every day | ORAL | Status: DC
  Administered 2019-04-21: 10 mg via ORAL
  Filled 2019-04-21 (×2): qty 1

## 2019-04-21 MED ORDER — AMLODIPINE BESYLATE 5 MG PO TABS: 5.0000 mg | ORAL_TABLET | Freq: Every day | ORAL | 11 refills | Status: DC

## 2019-04-21 MED ORDER — ATORVASTATIN CALCIUM 80 MG PO TABS: 80.0000 mg | ORAL_TABLET | Freq: Every day | ORAL | 11 refills | Status: AC

## 2019-04-21 MED ORDER — CLOPIDOGREL BISULFATE 75 MG PO TABS: 75.0000 mg | ORAL_TABLET | Freq: Every day | ORAL | 10 refills | Status: DC

## 2019-04-21 MED ORDER — LISINOPRIL 20 MG PO TABS: 20.0000 mg | ORAL_TABLET | Freq: Every day | ORAL | 11 refills | Status: DC

## 2019-04-21 MED ORDER — ASPIRIN 81 MG PO CHEW: 81.0000 mg | CHEWABLE_TABLET | Freq: Every day | ORAL | 11 refills | Status: AC

## 2019-04-21 MED ORDER — SIMVASTATIN 40 MG PO TABS: 40.0000 mg | ORAL_TABLET | Freq: Every day | ORAL | 11 refills | Status: DC

## 2019-04-21 NOTE — Discharge Instructions (Signed)
You may remove the bandage off of your wrist this evening. Avoid lifting greater than 15 pounds for the next week. Try to avoid strenuous activity for the next week.

## 2019-04-21 NOTE — Progress Notes (Signed)
Cardiovascular and Pulmonary Nurse Navigator Note:    53 year old male with history of essential hypertension, hypercholesterolemia, and recent abnormal functional study which revealed mild to moderate anterioapical wall ischemia.  The patient presented for outpatient cardiac catheterization on 04/20/2019, which revealed two-vessel coronary artery disease with occluded mid codominant RCA and diffuse 80% stenosis mid LAD, with normal left ventricular function.  The patient underwent successful PCI with overlapping 2.25 x 15, 2.5 x 26, and 2.75 x 12 resolute Onyx stents in the mid and proximal LAD.   04/20/2019:   CORONARY STENT INTERVENTION  LEFT HEART CATH AND CORONARY ANGIOGRAPHY  Conclusion    Mid Cx lesion is 50% stenosed.  2nd Mrg lesion is 60% stenosed.  Mid LAD-1 lesion is 90% stenosed.  Mid LAD-2 lesion is 80% stenosed.  Prox LAD lesion is 50% stenosed.  Mid RCA lesion is 100% stenosed.  A drug-eluting stent was successfully placed using a STENT RESOLUTE ONYX 2.5X15.  Post intervention, there is a 0% residual stenosis.  A drug-eluting stent was successfully placed using a STENT RESOLUTE ONYX 2.5X26.  Post intervention, there is a 0% residual stenosis.  A drug-eluting stent was successfully placed using a [place] [id number].  Post intervention, there is a 0% residual stenosis.   1.  Two-vessel coronary artery disease with occluded mid codominant RCA, and diffuse 80% stenosis mid LAD 2.  Normal left ventricular function 3.  Successful PCI with overlapping 2.25 x 15, 2.5 x 26, and 2.75 x 12 resolute Onyx stents in the mid and proximal LAD    EDUCATION:   Rounded on patient to discuss CAD and cardiac rehab.   Patient lying in bed with head of bed elevated.   Discussed the definition of CAD. Reviewed the location of CAD and where his stents were placed. Informed patient he will be given stent cards. Explained the purpose of the stent cards. Instructed patient to  keep stent cards in his wallet.  ? Discussed modifiable risk factors including controlling blood pressure, cholesterol, and blood sugar; following heart healthy diet; maintaining healthy weight; exercise; and smoking cessation, if applicable.   ? Discussed cardiac medications including rationale for taking, mechanisms of action, and side effects. Stressed the importance of taking medications as prescribed. Patient being discharged on the following medications:  AmLodipine, Aspirin, Plavix, Lisinopril, Metoprolol, Zocor, Fish Oil.  Patient on Actos and Metformin for Diabetes.   ? Discussed emergency plan for heart attack symptoms. Patient verbalized understanding of need to call 911 and not to drive himself to ER if having cardiac symptoms / chest pain.   Diet of low sodium, low fat, low cholesterol heart healthy / carb modified diet discussed. Patient reported attending Diabetes classes at [facility] in the past.  Patient also reported eating vegetables from his garden.  Patient informed this RN that he knows about what he should be eating,  but is just limited with his activities due to his back issues.   ? Smoking Cessation - Patient is a NEVER smoker.   ? Exercise - Benefits of exercised discussed. Patient tries to stay active, but does not exercise per se.  Patient reporting that he is on disability for a chronic back issue / pain from back injury.    Informed patient his cardiologist has referred him to outpatient Cardiac Rehab. Patient reported he would not be able to participate in an ongoing exercise program due to his back issues and cost of program.  Patient reported he is on a fixed  income, has traditional Medicare and would not be able to afford the 20% co-pay required. Encouraged patient to remain as active as possible.   Patient appreciative of the above information.  ? Roanna Epley, RN, BSN, [place]  [facility] Cardiac & Pulmonary Rehab  Cardiovascular & Pulmonary Nurse Navigator   Direct Line: [phone]  Department Phone #: [phone] Fax: [phone]  Email Address: [email or web address]

## 2019-04-21 NOTE — Discharge Summary (Signed)
Physician Discharge Summary  Patient ID: Gerald Ellis MRN: [id number] DOB/AGE: June 24, 1965 52 y.o.  Admit date: 04/20/2019 Discharge date: 04/21/2019  Primary Discharge Diagnosis Coronary artery disease with stable angina Secondary Discharge Diagnosis same  Significant Diagnostic Studies: Arteriography:   Consults: None  Hospital Course: 53 year old male with history of essential hypertension, hypercholesterolemia, and recent abnormal functional study which revealed mild to moderate anterioapical wall ischemia.  The patient elected for cardiac catheterization with selective coronary arteriography on 04/20/2019, which revealed two-vessel coronary artery disease with occluded mid codominant RCA and diffuse 80% stenosis mid LAD, with normal left ventricular function.  The patient underwent successful PCI with overlapping 2.25 x 15, 2.5 x 26, and 2.75 x 12 resolute Onyx stents in the mid and proximal LAD.  There were no apparent perioperative complications.  The patient reports feeling much better this morning.  He denies recurrent chest pain or shortness of breath.   Discharge Exam: Blood pressure (!) 148/93, pulse 81, temperature 98.5 F (36.9 C), temperature source Oral, resp. rate 16, height 5\' 10"  (1.778 m), weight 105.2 kg, SpO2 97 %.  General appearance: alert, cooperative, appears stated age and no distress Head: Normocephalic, without obvious abnormality, atraumatic Eyes: negative Neck: no JVD Back: negative Resp: clear to auscultation bilaterally Cardio: regular rate and rhythm, S1, S2 normal, no murmur, click, rub or gallop Extremities: extremities normal, atraumatic, no cyanosis or edema Pulses: radial pulses 2+  Incision: R wrist with bandage in place with no surrounding erythema or edema, with no drainage. Labs:   Lab Results  Component Value Date   WBC 10.1 04/21/2019   HGB 13.4 04/21/2019   HCT 37.1 (L) 04/21/2019   MCV 83.4 04/21/2019   PLT 220 04/21/2019     Recent Labs  Lab 04/21/19 0515  NA 136  K 3.8  CL 103  CO2 25  BUN 12  CREATININE 0.64  CALCIUM 8.4*  GLUCOSE 229*     FOLLOW UP PLANS AND APPOINTMENTS Discharge Instructions    AMB Referral to Cardiac Rehabilitation - Phase II   Complete by: As directed    Diagnosis: Coronary Stents   After initial evaluation and assessments completed: Virtual Based Care may be provided alone or in conjunction with Phase 2 Cardiac Rehab based on patient barriers.: Yes     Allergies as of 04/21/2019      Reactions   Ibuprofen Shortness Of Breath   Nsaids Swelling   Swelling of throat      Medication List    TAKE these medications   amLODipine 5 MG tablet Commonly known as: NORVASC Take 1 tablet (5 mg total) by mouth daily.   aspirin 81 MG chewable tablet Chew 1 tablet (81 mg total) by mouth daily.   clopidogrel 75 MG tablet Commonly known as: PLAVIX Take 1 tablet (75 mg total) by mouth daily with breakfast.   Fish Oil 500 MG Caps Take 500 mg by mouth 2 (two) times daily.   fluticasone 50 MCG/ACT nasal spray Commonly known as: FLONASE Place 1 spray into both nostrils 2 (two) times daily as needed (nasal congestion).   HumuLIN R U-500 KwikPen 500 UNIT/ML kwikpen Generic drug: insulin regular human CONCENTRATED Inject 50 Units into the skin 2 (two) times daily.   HYDROcodone-acetaminophen 5-325 MG tablet Commonly known as: NORCO/VICODIN Limit 1 tab by mouth per day or twice a day if tolerated What changed:   how much to take  how to take this  when to take this  reasons to take this  additional instructions   lisinopril 20 MG tablet Commonly known as: ZESTRIL Take 1 tablet (20 mg total) by mouth daily. What changed: when to take this   metFORMIN 1000 MG tablet Commonly known as: GLUCOPHAGE Take 1,000 mg by mouth 2 (two) times daily.   metoCLOPramide 5 MG tablet Commonly known as: REGLAN Take 5 mg by mouth 2 (two) times daily.   metoprolol succinate 50 MG  24 hr tablet Commonly known as: TOPROL-XL Take 50 mg by mouth daily. Take with or immediately following a meal.   multivitamin with minerals tablet Take 1 tablet by mouth 2 (two) times daily.   pantoprazole 40 MG tablet Commonly known as: PROTONIX Take 40 mg by mouth 2 (two) times daily.   pioglitazone 15 MG tablet Commonly known as: ACTOS Take 15 mg by mouth 2 (two) times daily.   simvastatin 40 MG tablet Commonly known as: ZOCOR Take 1 tablet (40 mg total) by mouth daily at 6 PM.      Follow-up Information    Paraschos, Alexander, MD. Go in 1 week(s).   Specialty: Cardiology Contact information: [facility] [place] [address] [phone]           BRING ALL MEDICATIONS WITH YOU TO FOLLOW UP APPOINTMENTS  Time spent with patient to include physician time: 30 min Signed:  Clabe Seal MD, PHD, [facility] 04/21/2019, 8:25 AM

## 2019-04-30 DIAGNOSIS — Z955 Presence of coronary angioplasty implant and graft: Secondary | ICD-10-CM | POA: Insufficient documentation

## 2019-10-06 ENCOUNTER — Encounter: Payer: Self-pay | Admitting: Urology

## 2019-10-06 ENCOUNTER — Ambulatory Visit (INDEPENDENT_AMBULATORY_CARE_PROVIDER_SITE_OTHER): Payer: Medicare Other | Admitting: Urology

## 2019-10-06 ENCOUNTER — Other Ambulatory Visit: Payer: Self-pay

## 2019-10-06 VITALS — BP 151/83 | HR 105 | Ht 70.0 in | Wt 238.0 lb

## 2019-10-06 DIAGNOSIS — N5201 Erectile dysfunction due to arterial insufficiency: Secondary | ICD-10-CM

## 2019-10-06 NOTE — Progress Notes (Signed)
10/05/19 11:36 AM   Gerald Ellis Dec 01, 1965 [id number]  Referring provider: [place] Pink, MD [address] [facility] [place],  [place] [address] Chief Complaint  Patient presents with  . Erectile Dysfunction    HPI: Gerald Ellis is a 54 y.o. male who presents for the evaluation and management of erectile dysfunction.   - 4 year history ED - Initially treated with tadalafil which he states was effective for 1 year -Progressive worsening of ED -Presently erections not firm enough for penetration and the rare times he achieves he has difficulty maintaining -No prior pain or curvature with erections -SHIM 5/25 indicating severe ED -Organic risk factors include coronary artery disease, hypertension, hyperlipidemia, anti-hypertensive medications, diabetes, coronary artery disease -No prior tobacco history -History hypogonadism on TRT however states was not beneficial and discontinued   PMH: Past Medical History:  Diagnosis Date  . Benign enlargement of prostate   . Bronchitis   . Childhood asthma   . Diabetes mellitus without complication ([place])   . Environmental allergies   . Erectile dysfunction   . Headache   . Hemorrhoids   . Hypercholesteremia   . Hypertension   . Hypogonadism in male   . Lumbago   . Over weight     Surgical History: Past Surgical History:  Procedure Laterality Date  . BACK SURGERY  1992  . CHOLECYSTECTOMY    . COLONOSCOPY WITH PROPOFOL N/A 08/07/2015   Procedure: COLONOSCOPY WITH PROPOFOL;  Surgeon: Lollie Sails, MD;  Location: [facility] ENDOSCOPY;  Service: Endoscopy;  Laterality: N/A;  . COLONOSCOPY WITH PROPOFOL N/A 08/08/2015   Procedure: COLONOSCOPY WITH PROPOFOL;  Surgeon: Lollie Sails, MD;  Location: [facility] ENDOSCOPY;  Service: Endoscopy;  Laterality: N/A;  . COLONOSCOPY WITH PROPOFOL N/A 09/16/2017   Procedure: COLONOSCOPY WITH PROPOFOL;  Surgeon: Lollie Sails, MD;  Location: [facility] ENDOSCOPY;  Service: Endoscopy;   Laterality: N/A;  . CORONARY STENT INTERVENTION N/A 04/20/2019   Procedure: CORONARY STENT INTERVENTION;  Surgeon: Isaias Cowman, MD;  Location: [place] CV LAB;  Service: Cardiovascular;  Laterality: N/A;  . ESOPHAGOGASTRODUODENOSCOPY (EGD) WITH PROPOFOL N/A 08/07/2015   Procedure: ESOPHAGOGASTRODUODENOSCOPY (EGD) WITH PROPOFOL;  Surgeon: Lollie Sails, MD;  Location: [facility] ENDOSCOPY;  Service: Endoscopy;  Laterality: N/A;  . ESOPHAGOGASTRODUODENOSCOPY (EGD) WITH PROPOFOL N/A 09/16/2017   Procedure: ESOPHAGOGASTRODUODENOSCOPY (EGD) WITH PROPOFOL;  Surgeon: Lollie Sails, MD;  Location: [facility] ENDOSCOPY;  Service: Endoscopy;  Laterality: N/A;  . LEFT HEART CATH AND CORONARY ANGIOGRAPHY Left 04/20/2019   Procedure: LEFT HEART CATH AND CORONARY ANGIOGRAPHY;  Surgeon: Isaias Cowman, MD;  Location: [place] CV LAB;  Service: Cardiovascular;  Laterality: Left;    Home Medications:  Allergies as of 10/06/2019      Reactions   Ibuprofen Shortness Of Breath   Nsaids Swelling   Swelling of throat      Medication List       Accurate as of Oct 06, 2019 11:59 PM. If you have any questions, ask your nurse or doctor.        amLODipine 5 MG tablet Commonly known as: NORVASC Take 1 tablet (5 mg total) by mouth daily.   aspirin 81 MG chewable tablet Chew 1 tablet (81 mg total) by mouth daily.   atorvastatin 80 MG tablet Commonly known as: Lipitor Take 1 tablet (80 mg total) by mouth daily at 6 PM.   Fish Oil 500 MG Caps Take 500 mg by mouth 2 (two) times daily.   fluticasone 50  MCG/ACT nasal spray Commonly known as: FLONASE Place 1 spray into both nostrils 2 (two) times daily as needed (nasal congestion).   HumuLIN R U-500 KwikPen 500 UNIT/ML kwikpen Generic drug: insulin regular human CONCENTRATED Inject 50 Units into the skin 2 (two) times daily.   HYDROcodone-acetaminophen 5-325 MG tablet Commonly known as: NORCO/VICODIN Limit 1 tab by mouth per day or  twice a day if tolerated What changed:   how much to take  how to take this  when to take this  reasons to take this  additional instructions   lisinopril 20 MG tablet Commonly known as: ZESTRIL Take 1 tablet (20 mg total) by mouth daily.   metFORMIN 1000 MG tablet Commonly known as: GLUCOPHAGE Take 1,000 mg by mouth 2 (two) times daily.   metoCLOPramide 5 MG tablet Commonly known as: REGLAN Take 5 mg by mouth 2 (two) times daily.   metoprolol succinate 50 MG 24 hr tablet Commonly known as: TOPROL-XL Take 50 mg by mouth daily. Take with or immediately following a meal.   multivitamin with minerals tablet Take 1 tablet by mouth 2 (two) times daily.   pantoprazole 40 MG tablet Commonly known as: PROTONIX Take 40 mg by mouth 2 (two) times daily.   pioglitazone 15 MG tablet Commonly known as: ACTOS Take 15 mg by mouth 2 (two) times daily.   prasugrel 10 MG Tabs tablet Commonly known as: EFFIENT Take 1 tablet (10 mg total) by mouth daily.       Allergies:  Allergies  Allergen Reactions  . Ibuprofen Shortness Of Breath  . Nsaids Swelling    Swelling of throat    Family History: Family History  Problem Relation Age of Onset  . Heart disease Mother        CABG  . Cancer Mother        Disseminated  . Hypertension Mother   . Hyperlipidemia Mother   . Diabetes Father        mother  . Alzheimer's disease Other   . Kidney disease Neg Hx   . Prostate cancer Neg Hx     Social History:  reports that he has never smoked. He has never used smokeless tobacco. He reports current alcohol use. He reports that he does not use drugs.   Physical Exam: BP (!) 151/83   Pulse (!) 105   Ht 5\' 10"  (1.778 m)   Wt 238 lb (108 kg)   BMI 34.15 kg/m   Constitutional:  Alert and oriented, No acute distress. HEENT: [place] AT, moist mucus membranes.  Trachea midline, no masses. Cardiovascular: No clubbing, cyanosis, or edema. Respiratory: Normal respiratory effort, no  increased work of breathing. GU: Phallus uncircumcised without lesions, testes descend bilaterally, no masses or tenderness.  Spermatic cord/epididymis palpably normal bilaterally Skin: No rashes, bruises or suspicious lesions. Neurologic: Grossly intact, no focal deficits, moving all 4 extremities. Psychiatric: Normal mood and affect.   Assessment & Plan:    1. Erectile dysfunction -Multiple risk factors, has failed viagra and cialis. Alternatives were discussed including penile injections, vacuum erection devices and penile implant surgery -He was not sure if he wanted to proceed with any of these options.  -He was given literature and he will call back if he is interested with pursuing.   [place] [address] [place], [place] [address] [phone]  I, Joneen Boers Peace, am acting as a Education administrator for Dr. Nicki Reaper C. Naziah Portee.  I have reviewed the above documentation for accuracy  and completeness, and I agree with the above.   Abbie Sons, MD

## 2019-10-10 ENCOUNTER — Encounter: Payer: Self-pay | Admitting: Urology

## 2019-10-26 IMAGING — CT CT ABD-PELV W/ CM
1 of 3 series · 13 of 32 positions shown, 18 images · IV contrast (omnipaque)
Comparison: 06/12/2015 CT abdomen/pelvis.

CLINICAL DATA: Left lower quadrant abdominal pain for several weeks
with diarrhea. History of cholecystectomy.

EXAM:
CT ABDOMEN AND PELVIS WITH CONTRAST
TECHNIQUE: Multidetector CT imaging of the abdomen and pelvis was performed
using the standard protocol following bolus administration of
intravenous contrast.
CONTRAST:  80mL OMNIPAQUE IOHEXOL 300 MG/ML  SOLN

[Series 2: axial st · axial · 0.88mm/px · z∈[-873,-398]mm · 13 of 107 slices shown, 18 images]
[im 6/107  soft-tissue]
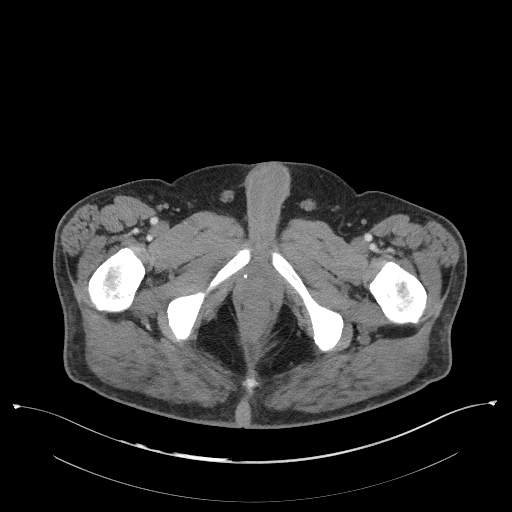
[im 6/107  bone]
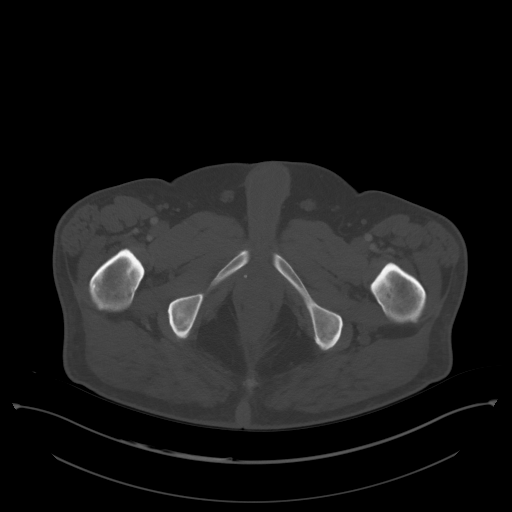
[im 16/107  soft-tissue]
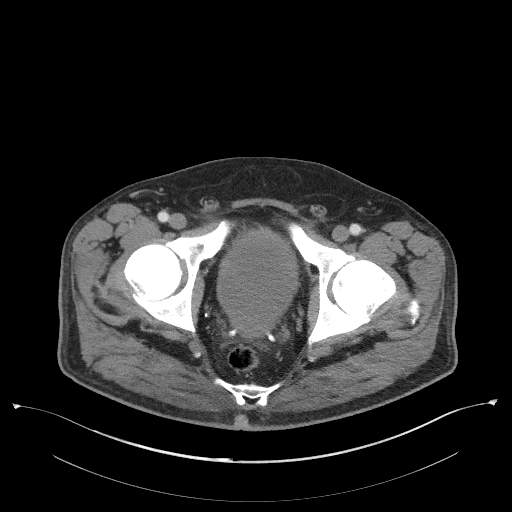
[im 22/107  soft-tissue]
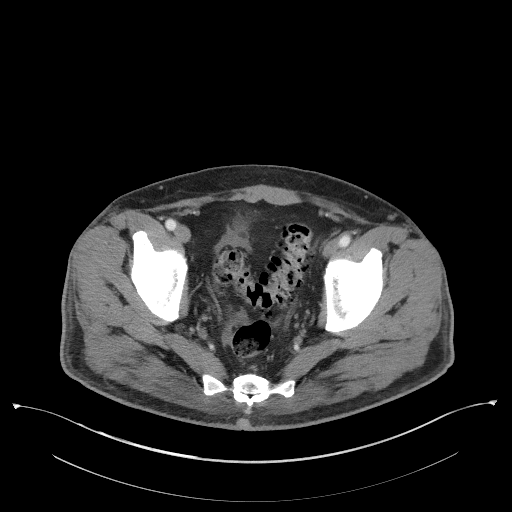
[im 32/107  soft-tissue]
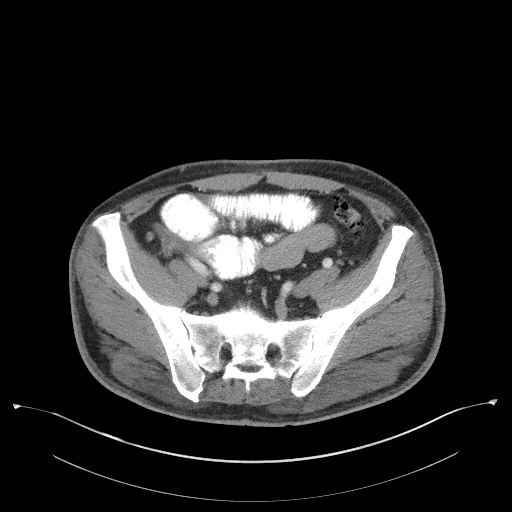
[im 43/107  soft-tissue]
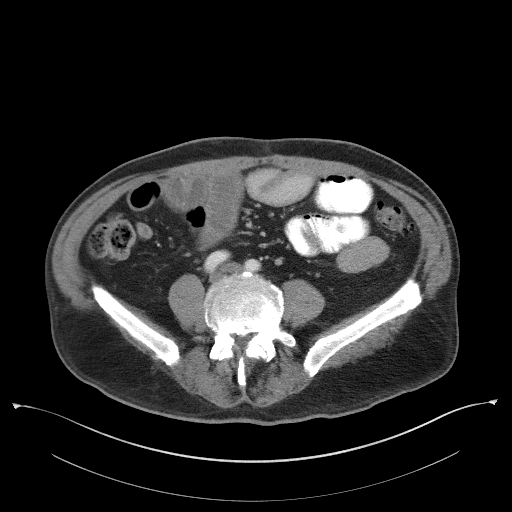
[im 48/107  soft-tissue]
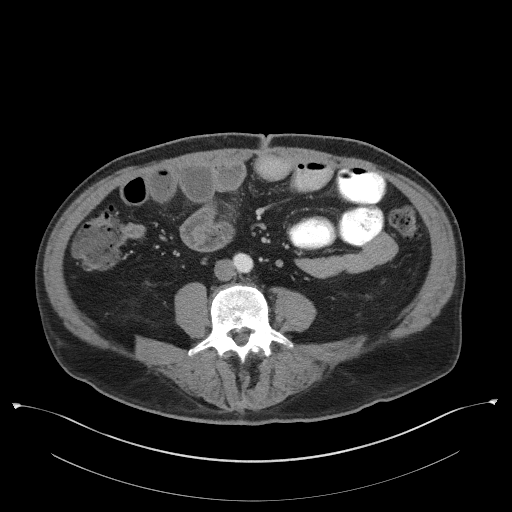
[im 59/107  soft-tissue]
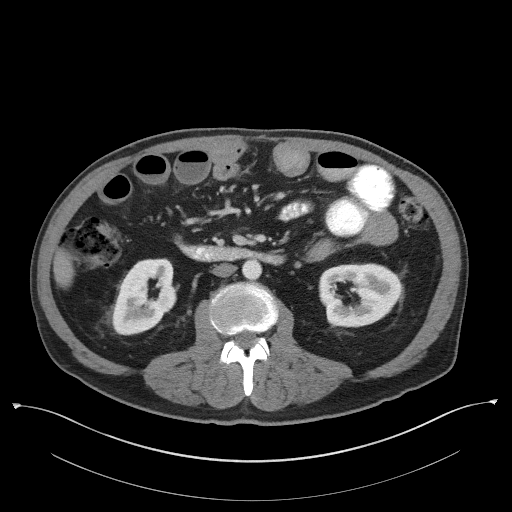
[im 64/107  soft-tissue]
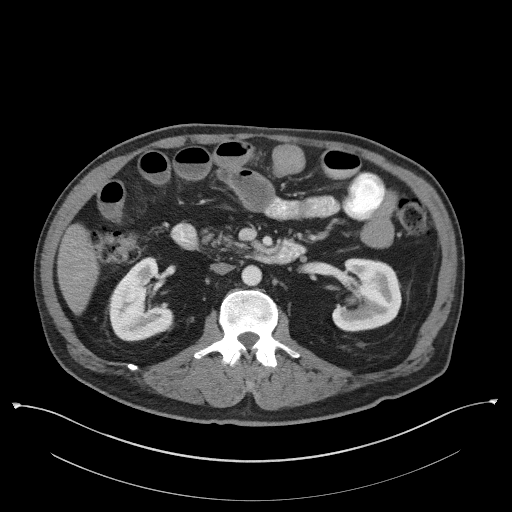
[im 75/107  soft-tissue]
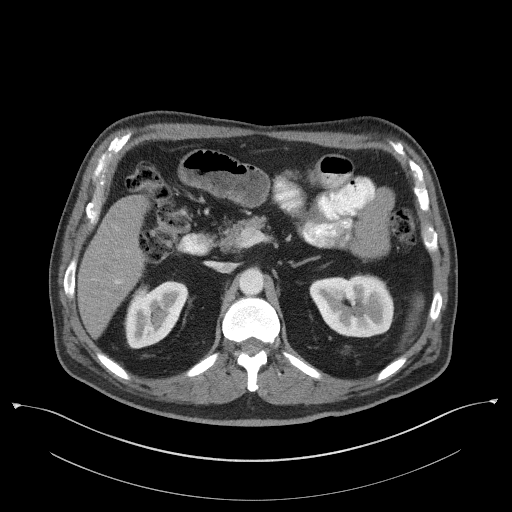
[im 75/107  bone]
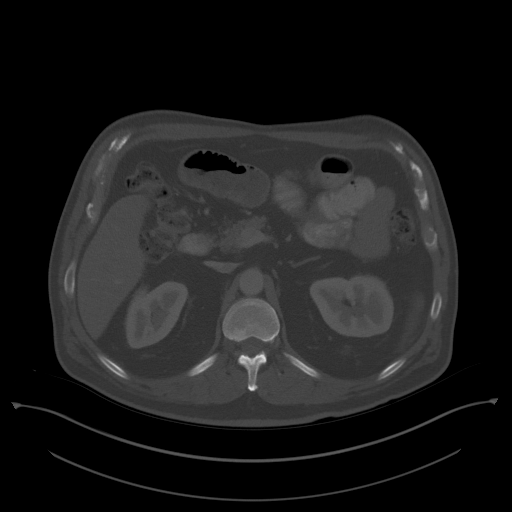
[im 85/107  soft-tissue]
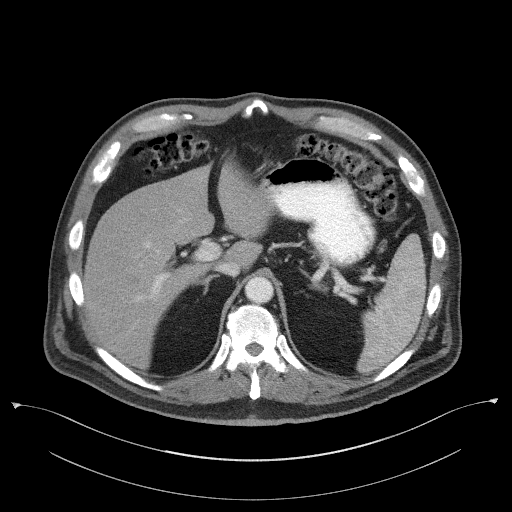
[im 85/107  lung]
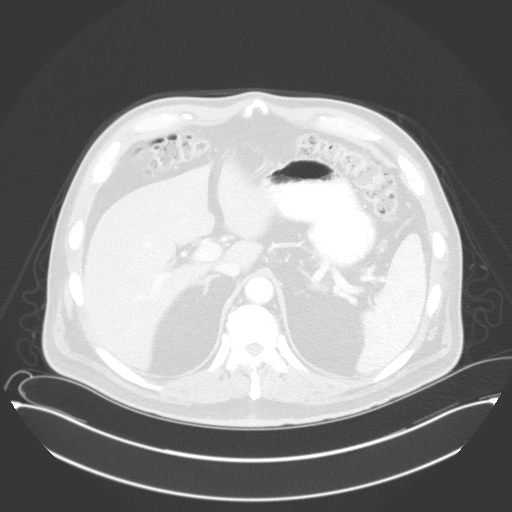
[im 91/107  soft-tissue]
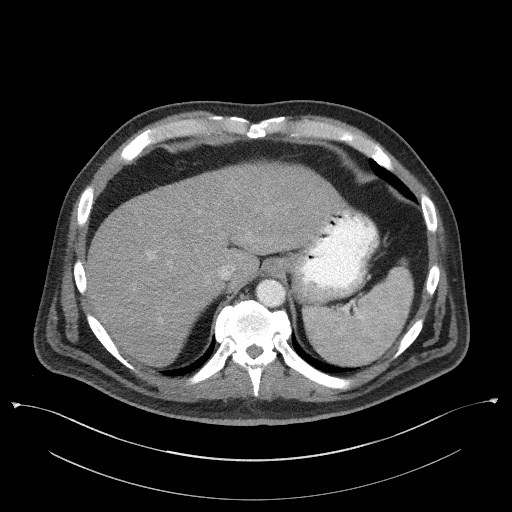
[im 91/107  lung]
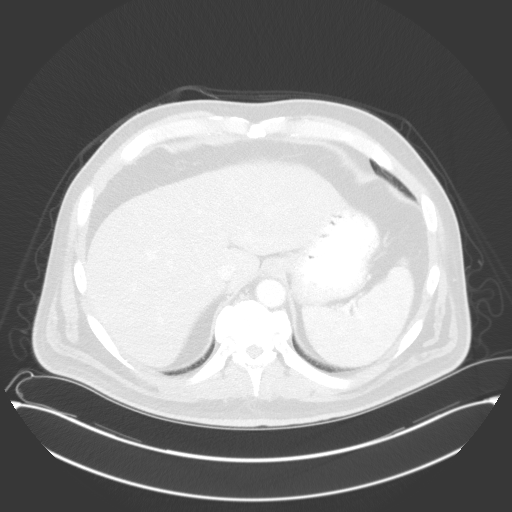
[im 96/107  lung]
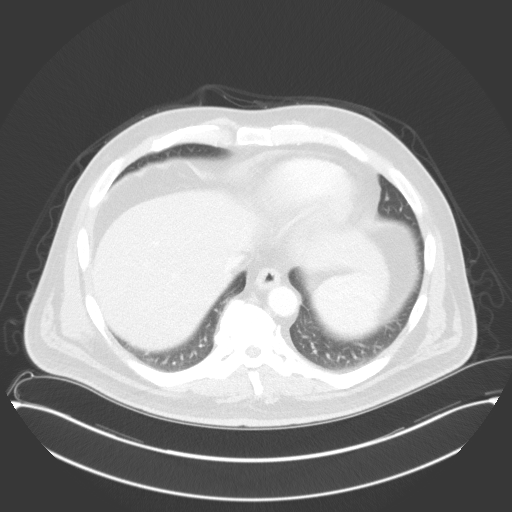
[im 101/107  soft-tissue]
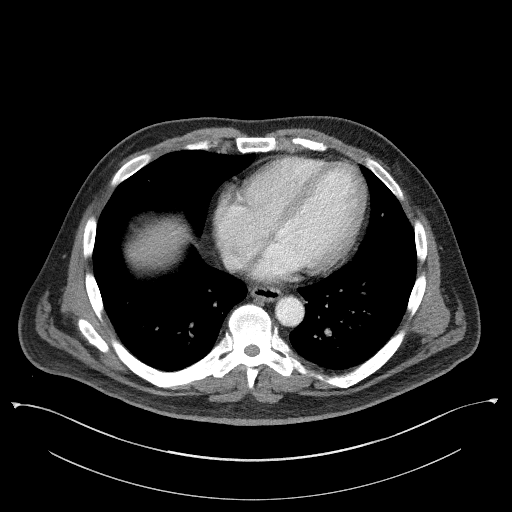
[im 101/107  lung]
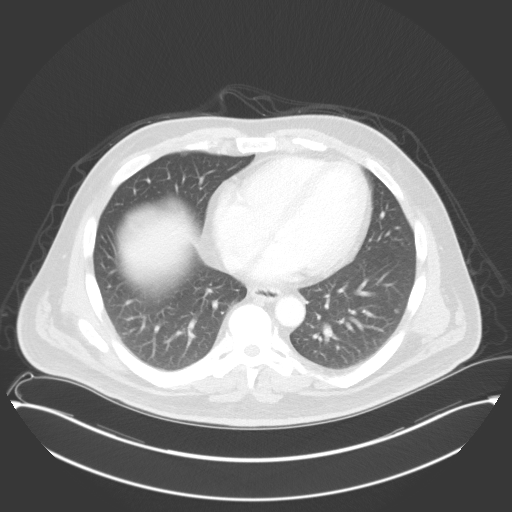

[13 of 32 positions shown; findings below may reference images not displayed]

FINDINGS: Lower chest: Left lower lobe 4 mm solid pulmonary nodule (series
6/image 7) is stable since 06/12/2015 CT, considered benign. No
acute abnormality at the lung bases. Coronary atherosclerosis.

Hepatobiliary: Normal liver size. No liver mass. Cholecystectomy. No
biliary ductal dilatation. Small periampullary duodenal diverticula.

Pancreas: Normal, with no mass or duct dilation.

Spleen: Normal size. No mass.

Adrenals/Urinary Tract: Normal adrenals. Normal kidneys with no
hydronephrosis and no renal mass. Normal bladder.

Stomach/Bowel: Normal non-distended stomach. Multiple mildly dilated
small bowel loops are noted throughout the abdomen measuring up to
4.1 cm diameter, with associated scattered air-fluid levels. There
is mild wall thickening within a few of mildly dilated small bowel
loops in the right abdomen. There is trace free fluid and haziness
of the surrounding fat associated with the dilated small bowel
loops. No discrete small bowel caliber transition is identified,
noting that the distal and terminal ileum are relatively collapsed.
No pneumatosis. Oral contrast transits to the mid to distal small
bowel. Normal appendix. Marked left colonic diverticulosis, with no
large bowel wall thickening or significant pericolonic fat
stranding.

Vascular/Lymphatic: Atherosclerotic nonaneurysmal abdominal aorta.
Patent portal, splenic, hepatic and renal veins. No pathologically
enlarged lymph nodes in the abdomen or pelvis.

Reproductive: Top-normal size prostate.

Other: Trace free fluid in lower peritoneal cavity. No focal fluid
collections. No pneumoperitoneum.

Musculoskeletal: No aggressive appearing focal osseous lesions.
Moderate lumbar spondylosis.
IMPRESSION: 1. Diffuse mild small bowel dilatation with relatively collapsed
distal and terminal ileum. No focal small bowel caliber transition
identified. Mild wall thickening within a few of the dilated right
abdominal small bowel loops. Most likely, findings represent a
partial distal small bowel obstruction due to adhesions. Small bowel
enteritis with secondary ileus could have this appearance.
2. Marked left colonic diverticulosis, with no evidence of acute
diverticulitis.
3.  Aortic Atherosclerosis (8G122-WCS.S).  Coronary atherosclerosis.

## 2019-10-29 IMAGING — CR DG ABDOMEN 2V
4 series · 4 of 4 positions shown · non-contrast
Comparison: CT abdomen pelvis of 05/29/2018

CLINICAL DATA: Nausea, vomiting over the last week and half,
possible nonspecific gastroenteritis

EXAM:
ABDOMEN - 2 VIEW

[abdomen erect (1 of 2)]
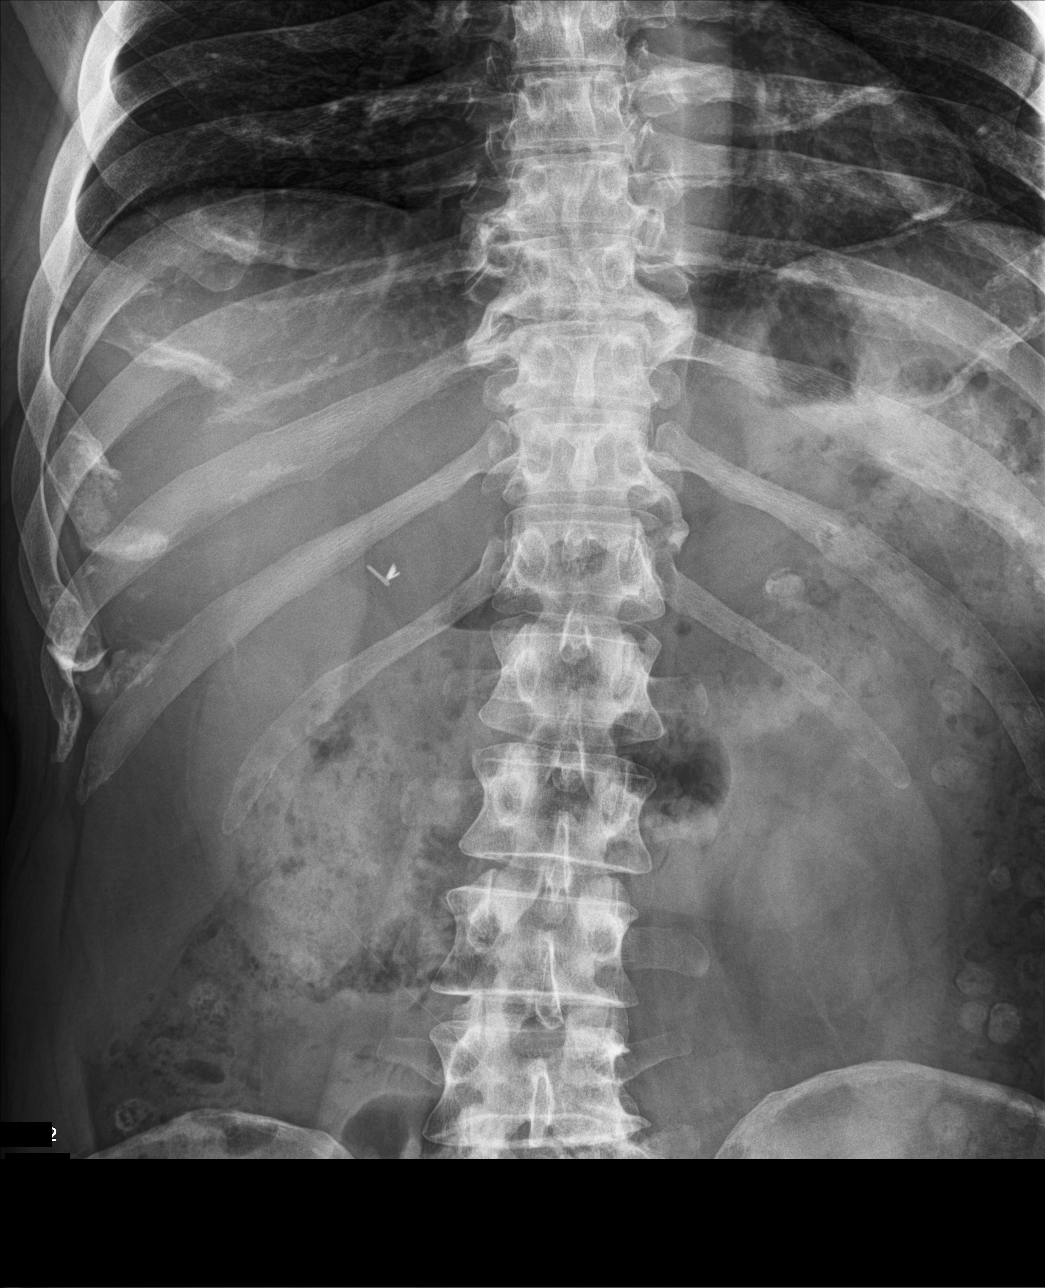

[abdomen erect (2 of 2)]
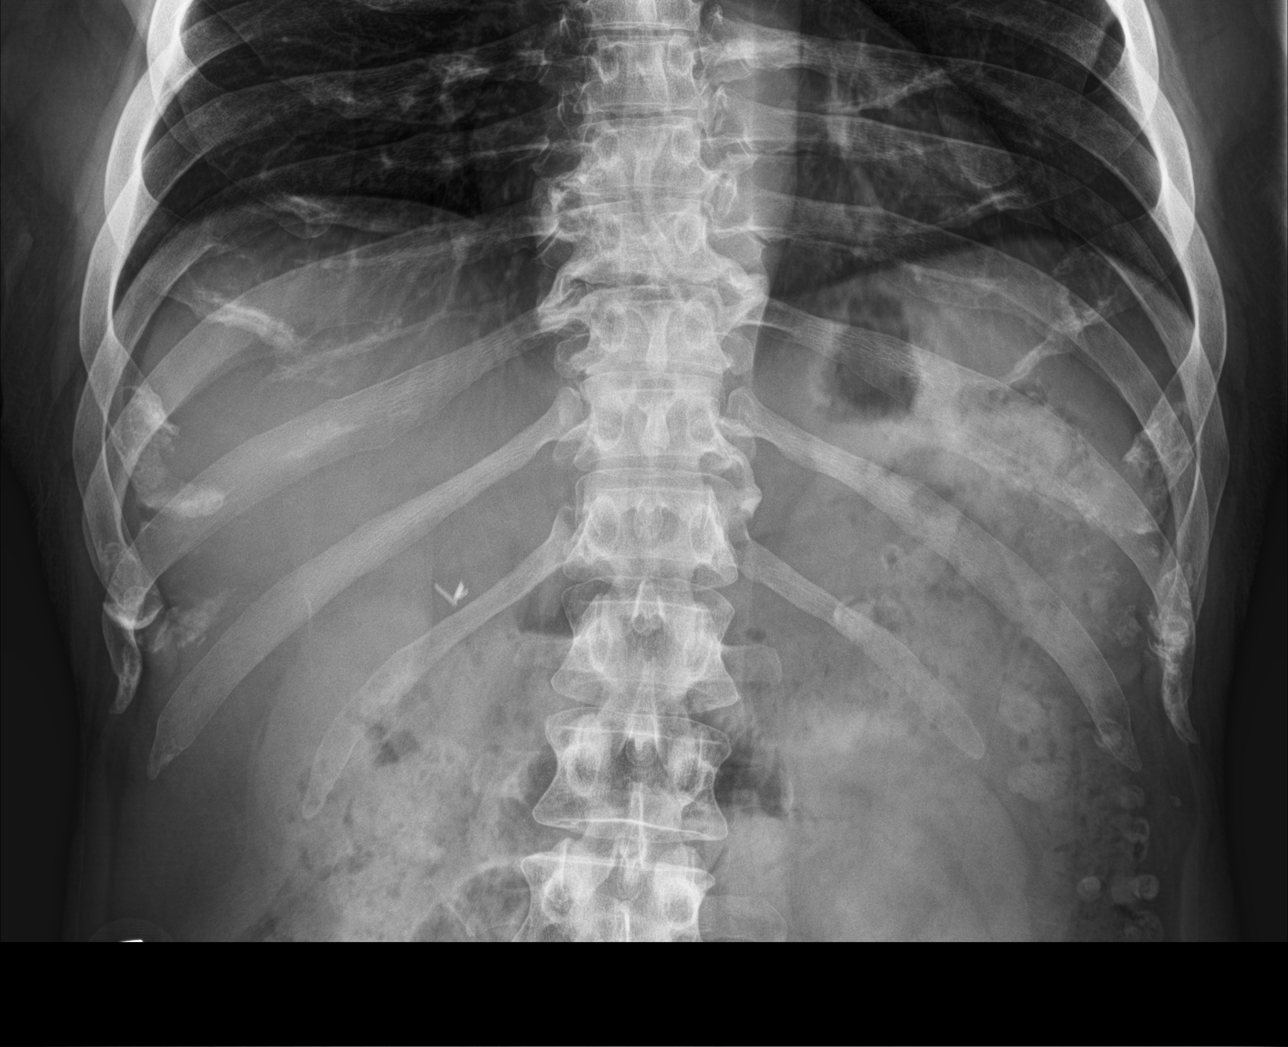

[abdomen supine (1 of 2)]
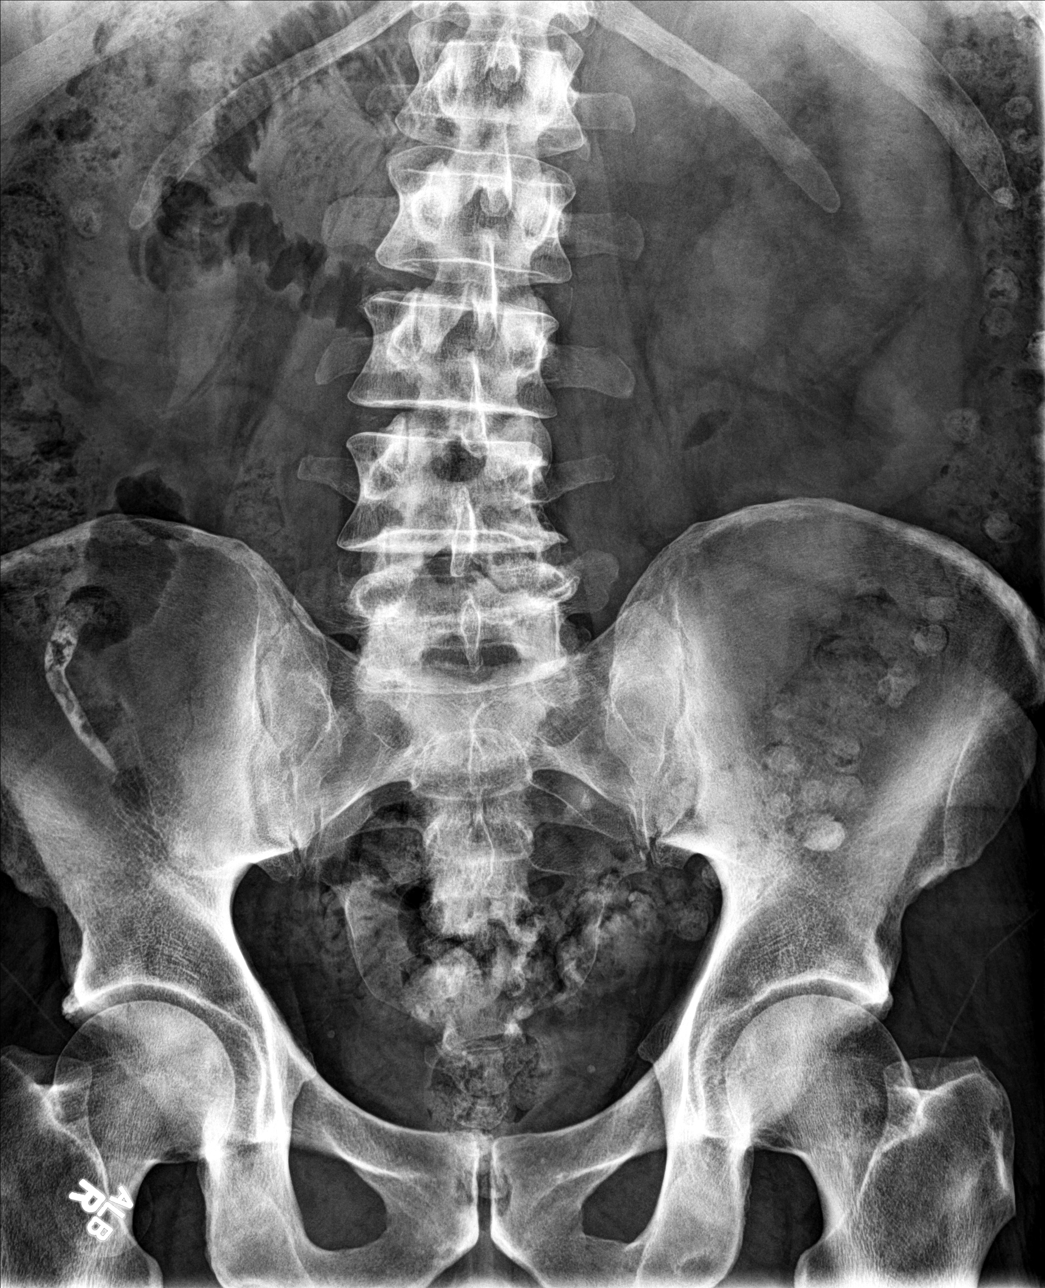

[abdomen supine (2 of 2)]
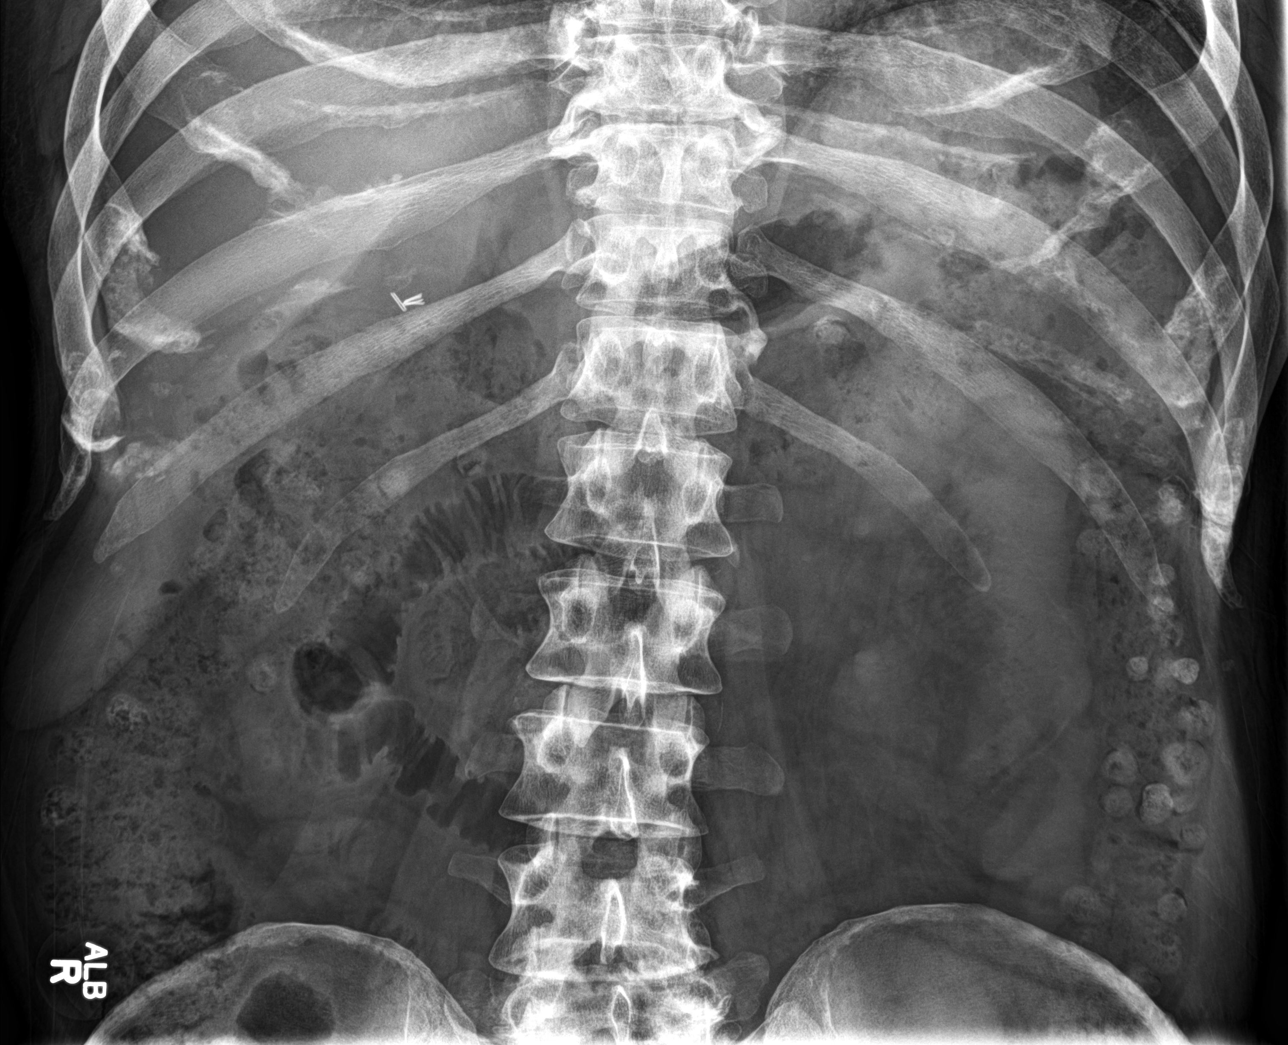

[4 of 4 positions shown; findings below may reference images not displayed]

FINDINGS: Compared to the prior CT, there is very little gaseous distention of
large or small bowel. Only a small amount of small-bowel is
gas-filled within the epigastrium but appears relatively
nonspecific. The colon is decompressed and there are multiple
colonic diverticula containing some residual contrast. On the erect
view, no obstruction is seen and no free air is noted. There is a
moderate amount of feces throughout the colon. No opaque calculi are
seen. The bones are unremarkable. Surgical clips are present in the
right upper quadrant from prior cholecystectomy.
IMPRESSION: 1. No definite evidence of small bowel obstruction with only minimal
small bowel gas-filled loops in the mid epigastrium.
2. No bowel obstruction.  No free air.
3. Multiple colonic diverticula with a moderate amount of feces
throughout the colon.

## 2019-12-08 ENCOUNTER — Other Ambulatory Visit
Admission: RE | Admit: 2019-12-08 | Discharge: 2019-12-08 | Disposition: A | Discharge status: Home / Self Care | Payer: Medicare Other | Source: Ambulatory Visit | Attending: Pulmonary Disease | Admitting: Pulmonary Disease

## 2019-12-08 DIAGNOSIS — R0600 Dyspnea, unspecified: Secondary | ICD-10-CM | POA: Insufficient documentation

## 2019-12-08 LAB — FIBRIN DERIVATIVES D-DIMER (ARMC ONLY): Fibrin derivatives D-dimer (ARMC): 443.16 ng/mL (FEU) (ref 0.00–499.00)

## 2020-04-19 DIAGNOSIS — I25118 Atherosclerotic heart disease of native coronary artery with other forms of angina pectoris: Secondary | ICD-10-CM | POA: Diagnosis not present

## 2020-05-23 ENCOUNTER — Other Ambulatory Visit (INDEPENDENT_AMBULATORY_CARE_PROVIDER_SITE_OTHER): Payer: Self-pay | Admitting: Vascular Surgery

## 2020-05-23 DIAGNOSIS — L97519 Non-pressure chronic ulcer of other part of right foot with unspecified severity: Secondary | ICD-10-CM

## 2020-05-29 ENCOUNTER — Ambulatory Visit (INDEPENDENT_AMBULATORY_CARE_PROVIDER_SITE_OTHER): Payer: Medicare Other | Admitting: Vascular Surgery

## 2020-05-29 ENCOUNTER — Encounter (INDEPENDENT_AMBULATORY_CARE_PROVIDER_SITE_OTHER): Payer: Self-pay | Admitting: Vascular Surgery

## 2020-05-29 ENCOUNTER — Other Ambulatory Visit: Payer: Self-pay

## 2020-05-29 ENCOUNTER — Ambulatory Visit (INDEPENDENT_AMBULATORY_CARE_PROVIDER_SITE_OTHER): Payer: Medicare Other

## 2020-05-29 VITALS — BP 168/101 | HR 92 | Resp 16 | Ht 70.0 in | Wt 234.0 lb

## 2020-05-29 DIAGNOSIS — T7840XA Allergy, unspecified, initial encounter: Secondary | ICD-10-CM | POA: Insufficient documentation

## 2020-05-29 DIAGNOSIS — I25118 Atherosclerotic heart disease of native coronary artery with other forms of angina pectoris: Secondary | ICD-10-CM | POA: Diagnosis not present

## 2020-05-29 DIAGNOSIS — I739 Peripheral vascular disease, unspecified: Secondary | ICD-10-CM | POA: Diagnosis not present

## 2020-05-29 DIAGNOSIS — L97312 Non-pressure chronic ulcer of right ankle with fat layer exposed: Secondary | ICD-10-CM

## 2020-05-29 DIAGNOSIS — L97519 Non-pressure chronic ulcer of other part of right foot with unspecified severity: Secondary | ICD-10-CM

## 2020-05-29 DIAGNOSIS — E1165 Type 2 diabetes mellitus with hyperglycemia: Secondary | ICD-10-CM | POA: Insufficient documentation

## 2020-05-29 DIAGNOSIS — J45909 Unspecified asthma, uncomplicated: Secondary | ICD-10-CM

## 2020-05-29 DIAGNOSIS — I10 Essential (primary) hypertension: Secondary | ICD-10-CM

## 2020-05-29 DIAGNOSIS — E7800 Pure hypercholesterolemia, unspecified: Secondary | ICD-10-CM

## 2020-05-29 DIAGNOSIS — E119 Type 2 diabetes mellitus without complications: Secondary | ICD-10-CM | POA: Insufficient documentation

## 2020-05-30 ENCOUNTER — Encounter (INDEPENDENT_AMBULATORY_CARE_PROVIDER_SITE_OTHER): Payer: Self-pay | Admitting: Vascular Surgery

## 2020-05-30 DIAGNOSIS — L97312 Non-pressure chronic ulcer of right ankle with fat layer exposed: Secondary | ICD-10-CM | POA: Insufficient documentation

## 2020-05-30 DIAGNOSIS — I739 Peripheral vascular disease, unspecified: Secondary | ICD-10-CM | POA: Insufficient documentation

## 2020-05-30 MED ORDER — SILVER SULFADIAZINE 1 % EX CREA: 1.0000 "application " | TOPICAL_CREAM | Freq: Every day | CUTANEOUS | 2 refills | Status: DC
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
## 2020-06-29 ENCOUNTER — Encounter (INDEPENDENT_AMBULATORY_CARE_PROVIDER_SITE_OTHER): Payer: Self-pay | Admitting: Vascular Surgery

## 2020-06-29 ENCOUNTER — Other Ambulatory Visit: Payer: Self-pay

## 2020-06-29 ENCOUNTER — Ambulatory Visit (INDEPENDENT_AMBULATORY_CARE_PROVIDER_SITE_OTHER): Payer: Medicare Other | Admitting: Vascular Surgery

## 2020-06-29 VITALS — BP 163/91 | HR 89 | Ht 70.0 in | Wt 239.0 lb

## 2020-06-29 DIAGNOSIS — I25118 Atherosclerotic heart disease of native coronary artery with other forms of angina pectoris: Secondary | ICD-10-CM | POA: Diagnosis not present

## 2020-06-29 DIAGNOSIS — I890 Lymphedema, not elsewhere classified: Secondary | ICD-10-CM

## 2020-06-29 DIAGNOSIS — I10 Essential (primary) hypertension: Secondary | ICD-10-CM

## 2020-06-29 DIAGNOSIS — L97312 Non-pressure chronic ulcer of right ankle with fat layer exposed: Secondary | ICD-10-CM | POA: Diagnosis not present

## 2020-06-29 DIAGNOSIS — I872 Venous insufficiency (chronic) (peripheral): Secondary | ICD-10-CM | POA: Diagnosis not present

## 2020-06-29 NOTE — Progress Notes (Signed)
MRN : [id number]  Gerald Ellis is a 55 y.o. (28-Nov-1965) male who presents with chief complaint of No chief complaint on file. Marland Kitchen  History of Present Illness:   The patient returns to the office for followup evaluation regarding leg swelling.  The swelling has improved quite a bit and the pain associated with swelling has decreased substantially. There have not been any interval development of new ulcerations or wounds.  The existing venus ulcer remains present and mildly painful.  Since the previous visit the patient has been wearing graduated compression stockings and has noted little significant improvement in the lymphedema. The patient has been using compression routinely morning until night.  The patient also states elevation during the day and exercise is being done too.   No outpatient medications have been marked as taking for the 06/29/20 encounter (Appointment) with Delana Meyer, Dolores Lory, MD.    Past Medical History:  Diagnosis Date  . Benign enlargement of prostate   . Bronchitis   . Childhood asthma   . Diabetes mellitus without complication ([place])   . Environmental allergies   . Erectile dysfunction   . Headache   . Hemorrhoids   . Hypercholesteremia   . Hypertension   . Hypogonadism in male   . Lumbago   . Over weight     Past Surgical History:  Procedure Laterality Date  . BACK SURGERY  1992  . CHOLECYSTECTOMY    . COLONOSCOPY WITH PROPOFOL N/A 08/07/2015   Procedure: COLONOSCOPY WITH PROPOFOL;  Surgeon: Lollie Sails, MD;  Location: [facility] ENDOSCOPY;  Service: Endoscopy;  Laterality: N/A;  . COLONOSCOPY WITH PROPOFOL N/A 08/08/2015   Procedure: COLONOSCOPY WITH PROPOFOL;  Surgeon: Lollie Sails, MD;  Location: [facility] ENDOSCOPY;  Service: Endoscopy;  Laterality: N/A;  . COLONOSCOPY WITH PROPOFOL N/A 09/16/2017   Procedure: COLONOSCOPY WITH PROPOFOL;  Surgeon: Lollie Sails, MD;  Location: [facility] ENDOSCOPY;  Service: Endoscopy;  Laterality: N/A;  .  CORONARY STENT INTERVENTION N/A 04/20/2019   Procedure: CORONARY STENT INTERVENTION;  Surgeon: Isaias Cowman, MD;  Location: [place] CV LAB;  Service: Cardiovascular;  Laterality: N/A;  . ESOPHAGOGASTRODUODENOSCOPY (EGD) WITH PROPOFOL N/A 08/07/2015   Procedure: ESOPHAGOGASTRODUODENOSCOPY (EGD) WITH PROPOFOL;  Surgeon: Lollie Sails, MD;  Location: [facility] ENDOSCOPY;  Service: Endoscopy;  Laterality: N/A;  . ESOPHAGOGASTRODUODENOSCOPY (EGD) WITH PROPOFOL N/A 09/16/2017   Procedure: ESOPHAGOGASTRODUODENOSCOPY (EGD) WITH PROPOFOL;  Surgeon: Lollie Sails, MD;  Location: [facility] ENDOSCOPY;  Service: Endoscopy;  Laterality: N/A;  . LEFT HEART CATH AND CORONARY ANGIOGRAPHY Left 04/20/2019   Procedure: LEFT HEART CATH AND CORONARY ANGIOGRAPHY;  Surgeon: Isaias Cowman, MD;  Location: [place] CV LAB;  Service: Cardiovascular;  Laterality: Left;    Social History Social History   Tobacco Use  . Smoking status: Never Smoker  . Smokeless tobacco: Never Used  Substance Use Topics  . Alcohol use: Yes    Alcohol/week: 0.0 standard drinks    Comment: occasionally  . Drug use: No    Family History Family History  Problem Relation Age of Onset  . Heart disease Mother        CABG  . Cancer Mother        Disseminated  . Hypertension Mother   . Hyperlipidemia Mother   . Diabetes Father        mother  . Alzheimer's disease Other   . Kidney disease Neg Hx   . Prostate cancer Neg Hx     Allergies  Allergen Reactions  .  Ibuprofen Shortness Of Breath  . Nsaids Swelling    Swelling of throat     REVIEW OF SYSTEMS (Negative unless checked)  Constitutional: [] Weight loss  [] Fever  [] Chills Cardiac: [] Chest pain   [] Chest pressure   [] Palpitations   [] Shortness of breath when laying flat   [] Shortness of breath with exertion. Vascular:  [] Pain in legs with walking   [] Pain in legs at rest  [] History of DVT   [] Phlebitis   [x] Swelling in legs   [] Varicose veins    [x] Non-healing ulcers Pulmonary:   [] Uses home oxygen   [] Productive cough   [] Hemoptysis   [] Wheeze  [] COPD   [] Asthma Neurologic:  [] Dizziness   [] Seizures   [] History of stroke   [] History of TIA  [] Aphasia   [] Vissual changes   [] Weakness or numbness in arm   [] Weakness or numbness in leg Musculoskeletal:   [] Joint swelling   [] Joint pain   [] Low back pain Hematologic:  [] Easy bruising  [] Easy bleeding   [] Hypercoagulable state   [] Anemic Gastrointestinal:  [] Diarrhea   [] Vomiting  [] Gastroesophageal reflux/heartburn   [] Difficulty swallowing. Genitourinary:  [] Chronic kidney disease   [] Difficult urination  [] Frequent urination   [] Blood in urine Skin:  [x] Rashes   [x] Ulcers  Psychological:  [] History of anxiety   []  History of major depression.  Physical Examination  There were no vitals filed for this visit. There is no height or weight on file to calculate BMI. Gen: WD/WN, NAD Head: [place]/AT, No temporalis wasting.  Ear/Nose/Throat: Hearing grossly intact, nares w/o erythema or drainage Eyes: PER, EOMI, sclera nonicteric.  Neck: Supple, no large masses.   Pulmonary:  Good air movement, no audible wheezing bilaterally, no use of accessory muscles.  Cardiac: RRR, no JVD Vascular: 2-3+ edema of the right leg with severe venous changes of the right leg.  Venous ulcer noted in the right ankle area on the right, noninfected and superficial Vessel Right Left  Radial Palpable Palpable  PT Palpable Palpable  DP Palpable Palpable  Gastrointestinal: Non-distended. No guarding/no peritoneal signs.  Musculoskeletal: M/S 5/5 throughout.  No deformity or atrophy.  Neurologic: CN 2-12 intact. Symmetrical.  Speech is fluent. Motor exam as listed above. Psychiatric: Judgment intact, Mood & affect appropriate for pt's clinical situation. Dermatologic:Moderate to severe venous rashes + ulcers noted.  No changes consistent with cellulitis. Lymph : + lichenification / skin changes of chronic  lymphedema.  CBC Lab Results  Component Value Date   WBC 10.1 04/21/2019   HGB 13.4 04/21/2019   HCT 37.1 (L) 04/21/2019   MCV 83.4 04/21/2019   PLT 220 04/21/2019    BMET    Component Value Date/Time   NA 136 04/21/2019 0515   K 3.8 04/21/2019 0515   CL 103 04/21/2019 0515   CO2 25 04/21/2019 0515   GLUCOSE 229 (H) 04/21/2019 0515   BUN 12 04/21/2019 0515   CREATININE 0.64 04/21/2019 0515   CALCIUM 8.4 (L) 04/21/2019 0515   GFRNONAA >60 04/21/2019 0515   GFRAA >60 04/21/2019 0515   CrCl cannot be calculated (Patient's most recent lab result is older than the maximum 21 days allowed.).  COAG No results found for: INR, PROTIME  Radiology No results found.   Assessment/Plan 1. Ankle ulcer, right, with fat layer exposed ([place]) The ulcer appears to be healing although it is doing so slowly.  His previous ABIs are normal with triphasic Doppler signals.  Given these findings I would continue local wound care for now I do not believe  angiography is indicated at this time.  I will send in a prescription for Silvadene cream.  2. Chronic venous insufficiency No surgery or intervention at this point in time.    I have had a long discussion with the patient regarding venous insufficiency and why it  causes symptoms. I have discussed with the patient the chronic skin changes that accompany venous insufficiency and the long term sequela such as infection and ulceration.  Patient will begin wearing graduated compression stockings class 1 (20-30 mmHg) or compression wraps on a daily basis a prescription was given. The patient will put the stockings on first thing in the morning and removing them in the evening. The patient is instructed specifically not to sleep in the stockings.    In addition, behavioral modification including several periods of elevation of the lower extremities during the day will be continued. I have demonstrated that proper elevation is a position with the ankles at  heart level.  The patient is instructed to begin routine exercise, especially walking on a daily basis  Following the review of the ultrasound the patient will follow up in 2 months to reassess the degree of swelling and the control that graduated compression stockings or compression wraps  is offering.   The patient can be assessed for a Lymph Pump at that time  3. Lymphedema I have had a long discussion with the patient regarding swelling and why it  causes symptoms.  Patient will begin wearing graduated compression stockings class 1 (20-30 mmHg) on a daily basis a prescription was given. The patient will  beginning wearing the stockings first thing in the morning and removing them in the evening. The patient is instructed specifically not to sleep in the stockings.   In addition, behavioral modification will be initiated.  This will include frequent elevation, use of over the counter pain medications and exercise such as walking.  I have reviewed systemic causes for chronic edema such as liver, kidney and cardiac etiologies.  The patient denies problems with these organ systems.    Consideration for a lymph pump will also be made based upon the effectiveness of conservative therapy.  This would help to improve the edema control and prevent sequela such as ulcers and infections   4. Coronary artery disease of native artery of native heart with stable angina pectoris (HCC) Continue cardiac and antihypertensive medications as already ordered and reviewed, no changes at this time.  Continue statin as ordered and reviewed, no changes at this time  Nitrates PRN for chest pain   5. Essential hypertension Continue antihypertensive medications as already ordered, these medications have been reviewed and there are no changes at this time.    Hortencia Pilar, MD  06/29/2020 12:32 PM

## 2020-07-02 ENCOUNTER — Encounter (INDEPENDENT_AMBULATORY_CARE_PROVIDER_SITE_OTHER): Payer: Self-pay | Admitting: Vascular Surgery

## 2020-08-30 NOTE — Progress Notes (Deleted)
MRN : [id number]  Gerald Ellis is a 55 y.o. (January 02, 1966) male who presents with chief complaint of No chief complaint on file. Marland Kitchen  History of Present Illness:   The patient returns to the office for followup evaluation regarding leg swelling.  The swelling has improved quite a bit and the pain associated with swelling has decreased substantially. There have not been any interval development of new ulcerations or wounds.  The existing venus ulcer remains present and mildly painful.  Since the previous visit the patient has been wearing graduated compression stockings and has noted little significant improvement in the lymphedema. The patient has been using compression routinely morning until night.  The patient also states elevation during the day and exercise is being done too.  No outpatient medications have been marked as taking for the 08/31/20 encounter (Appointment) with Delana Meyer, Dolores Lory, MD.    Past Medical History:  Diagnosis Date  . Benign enlargement of prostate   . Bronchitis   . Childhood asthma   . Diabetes mellitus without complication ([place])   . Environmental allergies   . Erectile dysfunction   . Headache   . Hemorrhoids   . Hypercholesteremia   . Hypertension   . Hypogonadism in male   . Lumbago   . Over weight     Past Surgical History:  Procedure Laterality Date  . BACK SURGERY  1992  . CHOLECYSTECTOMY    . COLONOSCOPY WITH PROPOFOL N/A 08/07/2015   Procedure: COLONOSCOPY WITH PROPOFOL;  Surgeon: Lollie Sails, MD;  Location: [facility] ENDOSCOPY;  Service: Endoscopy;  Laterality: N/A;  . COLONOSCOPY WITH PROPOFOL N/A 08/08/2015   Procedure: COLONOSCOPY WITH PROPOFOL;  Surgeon: Lollie Sails, MD;  Location: [facility] ENDOSCOPY;  Service: Endoscopy;  Laterality: N/A;  . COLONOSCOPY WITH PROPOFOL N/A 09/16/2017   Procedure: COLONOSCOPY WITH PROPOFOL;  Surgeon: Lollie Sails, MD;  Location: [facility] ENDOSCOPY;  Service: Endoscopy;  Laterality: N/A;  .  CORONARY STENT INTERVENTION N/A 04/20/2019   Procedure: CORONARY STENT INTERVENTION;  Surgeon: Isaias Cowman, MD;  Location: [place] CV LAB;  Service: Cardiovascular;  Laterality: N/A;  . ESOPHAGOGASTRODUODENOSCOPY (EGD) WITH PROPOFOL N/A 08/07/2015   Procedure: ESOPHAGOGASTRODUODENOSCOPY (EGD) WITH PROPOFOL;  Surgeon: Lollie Sails, MD;  Location: [facility] ENDOSCOPY;  Service: Endoscopy;  Laterality: N/A;  . ESOPHAGOGASTRODUODENOSCOPY (EGD) WITH PROPOFOL N/A 09/16/2017   Procedure: ESOPHAGOGASTRODUODENOSCOPY (EGD) WITH PROPOFOL;  Surgeon: Lollie Sails, MD;  Location: [facility] ENDOSCOPY;  Service: Endoscopy;  Laterality: N/A;  . LEFT HEART CATH AND CORONARY ANGIOGRAPHY Left 04/20/2019   Procedure: LEFT HEART CATH AND CORONARY ANGIOGRAPHY;  Surgeon: Isaias Cowman, MD;  Location: [place] CV LAB;  Service: Cardiovascular;  Laterality: Left;    Social History Social History   Tobacco Use  . Smoking status: Never Smoker  . Smokeless tobacco: Never Used  Substance Use Topics  . Alcohol use: Yes    Alcohol/week: 0.0 standard drinks    Comment: occasionally  . Drug use: No    Family History Family History  Problem Relation Age of Onset  . Heart disease Mother        CABG  . Cancer Mother        Disseminated  . Hypertension Mother   . Hyperlipidemia Mother   . Diabetes Father        mother  . Alzheimer's disease Other   . Kidney disease Neg Hx   . Prostate cancer Neg Hx     Allergies  Allergen Reactions  .  Ibuprofen Shortness Of Breath  . Nsaids Swelling    Swelling of throat     REVIEW OF SYSTEMS (Negative unless checked)  Constitutional: [] Weight loss  [] Fever  [] Chills Cardiac: [] Chest pain   [] Chest pressure   [] Palpitations   [] Shortness of breath when laying flat   [] Shortness of breath with exertion. Vascular:  [] Pain in legs with walking   [] Pain in legs at rest  [] History of DVT   [] Phlebitis   [] Swelling in legs   [] Varicose veins    [] Non-healing ulcers Pulmonary:   [] Uses home oxygen   [] Productive cough   [] Hemoptysis   [] Wheeze  [] COPD   [] Asthma Neurologic:  [] Dizziness   [] Seizures   [] History of stroke   [] History of TIA  [] Aphasia   [] Vissual changes   [] Weakness or numbness in arm   [] Weakness or numbness in leg Musculoskeletal:   [] Joint swelling   [] Joint pain   [] Low back pain Hematologic:  [] Easy bruising  [] Easy bleeding   [] Hypercoagulable state   [] Anemic Gastrointestinal:  [] Diarrhea   [] Vomiting  [] Gastroesophageal reflux/heartburn   [] Difficulty swallowing. Genitourinary:  [] Chronic kidney disease   [] Difficult urination  [] Frequent urination   [] Blood in urine Skin:  [] Rashes   [] Ulcers  Psychological:  [] History of anxiety   []  History of major depression.  Physical Examination  There were no vitals filed for this visit. There is no height or weight on file to calculate BMI. Gen: WD/WN, NAD Head: [place]/AT, No temporalis wasting.  Ear/Nose/Throat: Hearing grossly intact, nares w/o erythema or drainage Eyes: PER, EOMI, sclera nonicteric.  Neck: Supple, no large masses.   Pulmonary:  Good air movement, no audible wheezing bilaterally, no use of accessory muscles.  Cardiac: RRR, no JVD Vascular: *** Vessel Right Left  Radial Palpable Palpable  Ulnar Palpable Palpable  Brachial Palpable Palpable  Carotid Palpable Palpable  Femoral Palpable Palpable  Popliteal Palpable Palpable  PT Palpable Palpable  DP Palpable Palpable  Gastrointestinal: Non-distended. No guarding/no peritoneal signs.  Musculoskeletal: M/S 5/5 throughout.  No deformity or atrophy.  Neurologic: CN 2-12 intact. Symmetrical.  Speech is fluent. Motor exam as listed above. Psychiatric: Judgment intact, Mood & affect appropriate for pt's clinical situation. Dermatologic: No rashes or ulcers noted.  No changes consistent with cellulitis. Lymph : No lichenification or skin changes of chronic lymphedema.  CBC Lab Results  Component  Value Date   WBC 10.1 04/21/2019   HGB 13.4 04/21/2019   HCT 37.1 (L) 04/21/2019   MCV 83.4 04/21/2019   PLT 220 04/21/2019    BMET    Component Value Date/Time   NA 136 04/21/2019 0515   K 3.8 04/21/2019 0515   CL 103 04/21/2019 0515   CO2 25 04/21/2019 0515   GLUCOSE 229 (H) 04/21/2019 0515   BUN 12 04/21/2019 0515   CREATININE 0.64 04/21/2019 0515   CALCIUM 8.4 (L) 04/21/2019 0515   GFRNONAA >60 04/21/2019 0515   GFRAA >60 04/21/2019 0515   CrCl cannot be calculated (Patient's most recent lab result is older than the maximum 21 days allowed.).  COAG No results found for: INR, PROTIME  Radiology No results found.   Assessment/Plan 1. Ankle ulcer, right, with fat layer exposed ([place]) The ulcer appears to be healing although it is doing so slowly. His previous ABIs are normal with triphasic Doppler signals. Given these findings I would continue local wound care for now I do not believe angiography is indicated at this time. I will send in a prescription  for Silvadene cream.  2. Chronic venous insufficiency No surgery or intervention at this point in time.    I have had a long discussion with the patient regarding venous insufficiency and why it  causes symptoms. I have discussed with the patient the chronic skin changes that accompany venous insufficiency and the long term sequela such as infection and ulceration.  Patient will begin wearing graduated compression stockings class 1 (20-30 mmHg) or compression wraps on a daily basis a prescription was given. The patient will put the stockings on first thing in the morning and removing them in the evening. The patient is instructed specifically not to sleep in the stockings.    In addition, behavioral modification including several periods of elevation of the lower extremities during the day will be continued. I have demonstrated that proper elevation is a position with the ankles at heart level.  The patient is  instructed to begin routine exercise, especially walking on a daily basis  Following the review of the ultrasound the patient will follow up in 2 months to reassess the degree of swelling and the control that graduated compression stockings or compression wraps  is offering.   The patient can be assessed for a Lymph Pump at that time  3. Lymphedema I have had a long discussion with the patient regarding swelling and why it  causes symptoms.  Patient will begin wearing graduated compression stockings class 1 (20-30 mmHg) on a daily basis a prescription was given. The patient will  beginning wearing the stockings first thing in the morning and removing them in the evening. The patient is instructed specifically not to sleep in the stockings.   In addition, behavioral modification will be initiated.  This will include frequent elevation, use of over the counter pain medications and exercise such as walking.  I have reviewed systemic causes for chronic edema such as liver, kidney and cardiac etiologies.  The patient denies problems with these organ systems.    Consideration for a lymph pump will also be made based upon the effectiveness of conservative therapy.  This would help to improve the edema control and prevent sequela such as ulcers and infections   4. Coronary artery disease of native artery of native heart with stable angina pectoris (HCC) Continue cardiac and antihypertensive medications as already ordered and reviewed, no changes at this time.  Continue statin as ordered and reviewed, no changes at this time  Nitrates PRN for chest pain   5. Essential hypertension Continue antihypertensive medications as already ordered, these medications have been reviewed and there are no changes at this time.   Hortencia Pilar, MD  08/30/2020 9:42 PM

## 2020-08-31 ENCOUNTER — Ambulatory Visit (INDEPENDENT_AMBULATORY_CARE_PROVIDER_SITE_OTHER): Payer: Medicare Other | Admitting: Vascular Surgery

## 2020-08-31 DIAGNOSIS — L97312 Non-pressure chronic ulcer of right ankle with fat layer exposed: Secondary | ICD-10-CM

## 2020-08-31 DIAGNOSIS — I890 Lymphedema, not elsewhere classified: Secondary | ICD-10-CM

## 2020-08-31 DIAGNOSIS — I872 Venous insufficiency (chronic) (peripheral): Secondary | ICD-10-CM

## 2020-08-31 DIAGNOSIS — I10 Essential (primary) hypertension: Secondary | ICD-10-CM

## 2020-08-31 DIAGNOSIS — I25118 Atherosclerotic heart disease of native coronary artery with other forms of angina pectoris: Secondary | ICD-10-CM

## 2020-09-04 ENCOUNTER — Encounter (INDEPENDENT_AMBULATORY_CARE_PROVIDER_SITE_OTHER): Payer: Self-pay | Admitting: Vascular Surgery

## 2020-09-04 ENCOUNTER — Ambulatory Visit (INDEPENDENT_AMBULATORY_CARE_PROVIDER_SITE_OTHER): Payer: Medicare Other | Admitting: Vascular Surgery

## 2020-09-04 ENCOUNTER — Other Ambulatory Visit: Payer: Self-pay

## 2020-09-04 VITALS — BP 204/109 | HR 94 | Resp 16 | Wt 244.0 lb

## 2020-09-04 DIAGNOSIS — I872 Venous insufficiency (chronic) (peripheral): Secondary | ICD-10-CM

## 2020-09-04 DIAGNOSIS — I25118 Atherosclerotic heart disease of native coronary artery with other forms of angina pectoris: Secondary | ICD-10-CM | POA: Diagnosis not present

## 2020-09-04 DIAGNOSIS — L97312 Non-pressure chronic ulcer of right ankle with fat layer exposed: Secondary | ICD-10-CM | POA: Diagnosis not present

## 2020-09-04 DIAGNOSIS — E119 Type 2 diabetes mellitus without complications: Secondary | ICD-10-CM

## 2020-09-04 DIAGNOSIS — I10 Essential (primary) hypertension: Secondary | ICD-10-CM

## 2020-09-04 NOTE — Progress Notes (Signed)
MRN : [id number]  Gerald Ellis is a 55 y.o. (1966-02-27) male who presents with chief complaint of  Chief Complaint  Patient presents with  . Follow-up    2 month follow up  .  History of Present Illness:   The patient returns to the office for followup evaluation regarding leg swelling.  The swelling has continued to  improved quite a bit and the pain associated with swelling has decreased substantially. There have not been any interval development of new ulcerations or wounds.  The existing venus ulcer remains present and mildly painful.  Since the previous visit the patient has been wearing graduated compression stockings and has noted little significant improvement in the lymphedema. The patient has been using compression routinely morning until night.  No outpatient medications have been marked as taking for the 09/04/20 encounter (Office Visit) with Delana Meyer, Dolores Lory, MD.    Past Medical History:  Diagnosis Date  . Benign enlargement of prostate   . Bronchitis   . Childhood asthma   . Diabetes mellitus without complication ([place])   . Environmental allergies   . Erectile dysfunction   . Headache   . Hemorrhoids   . Hypercholesteremia   . Hypertension   . Hypogonadism in male   . Lumbago   . Over weight     Past Surgical History:  Procedure Laterality Date  . BACK SURGERY  1992  . CHOLECYSTECTOMY    . COLONOSCOPY WITH PROPOFOL N/A 08/07/2015   Procedure: COLONOSCOPY WITH PROPOFOL;  Surgeon: Lollie Sails, MD;  Location: [facility] ENDOSCOPY;  Service: Endoscopy;  Laterality: N/A;  . COLONOSCOPY WITH PROPOFOL N/A 08/08/2015   Procedure: COLONOSCOPY WITH PROPOFOL;  Surgeon: Lollie Sails, MD;  Location: [facility] ENDOSCOPY;  Service: Endoscopy;  Laterality: N/A;  . COLONOSCOPY WITH PROPOFOL N/A 09/16/2017   Procedure: COLONOSCOPY WITH PROPOFOL;  Surgeon: Lollie Sails, MD;  Location: [facility] ENDOSCOPY;  Service: Endoscopy;  Laterality: N/A;  . CORONARY STENT  INTERVENTION N/A 04/20/2019   Procedure: CORONARY STENT INTERVENTION;  Surgeon: Isaias Cowman, MD;  Location: [place] CV LAB;  Service: Cardiovascular;  Laterality: N/A;  . ESOPHAGOGASTRODUODENOSCOPY (EGD) WITH PROPOFOL N/A 08/07/2015   Procedure: ESOPHAGOGASTRODUODENOSCOPY (EGD) WITH PROPOFOL;  Surgeon: Lollie Sails, MD;  Location: [facility] ENDOSCOPY;  Service: Endoscopy;  Laterality: N/A;  . ESOPHAGOGASTRODUODENOSCOPY (EGD) WITH PROPOFOL N/A 09/16/2017   Procedure: ESOPHAGOGASTRODUODENOSCOPY (EGD) WITH PROPOFOL;  Surgeon: Lollie Sails, MD;  Location: [facility] ENDOSCOPY;  Service: Endoscopy;  Laterality: N/A;  . LEFT HEART CATH AND CORONARY ANGIOGRAPHY Left 04/20/2019   Procedure: LEFT HEART CATH AND CORONARY ANGIOGRAPHY;  Surgeon: Isaias Cowman, MD;  Location: [place] CV LAB;  Service: Cardiovascular;  Laterality: Left;    Social History Social History   Tobacco Use  . Smoking status: Never Smoker  . Smokeless tobacco: Never Used  Substance Use Topics  . Alcohol use: Yes    Alcohol/week: 0.0 standard drinks    Comment: occasionally  . Drug use: No    Family History Family History  Problem Relation Age of Onset  . Heart disease Mother        CABG  . Cancer Mother        Disseminated  . Hypertension Mother   . Hyperlipidemia Mother   . Diabetes Father        mother  . Alzheimer's disease Other   . Kidney disease Neg Hx   . Prostate cancer Neg Hx     Allergies  Allergen Reactions  .  Ibuprofen Shortness Of Breath  . Nsaids Swelling    Swelling of throat     REVIEW OF SYSTEMS (Negative unless checked)  Constitutional: [] Weight loss  [] Fever  [] Chills Cardiac: [] Chest pain   [] Chest pressure   [] Palpitations   [] Shortness of breath when laying flat   [] Shortness of breath with exertion. Vascular:  [] Pain in legs with walking   [] Pain in legs at rest  [] History of DVT   [] Phlebitis   [x] Swelling in legs   [] Varicose veins   [] Non-healing  ulcers Pulmonary:   [] Uses home oxygen   [] Productive cough   [] Hemoptysis   [] Wheeze  [] COPD   [] Asthma Neurologic:  [] Dizziness   [] Seizures   [] History of stroke   [] History of TIA  [] Aphasia   [] Vissual changes   [] Weakness or numbness in arm   [] Weakness or numbness in leg Musculoskeletal:   [] Joint swelling   [x] Joint pain   [] Low back pain Hematologic:  [] Easy bruising  [] Easy bleeding   [] Hypercoagulable state   [] Anemic Gastrointestinal:  [] Diarrhea   [] Vomiting  [] Gastroesophageal reflux/heartburn   [] Difficulty swallowing. Genitourinary:  [] Chronic kidney disease   [] Difficult urination  [] Frequent urination   [] Blood in urine Skin:  [x] Rashes   [x] Ulcers  Psychological:  [] History of anxiety   []  History of major depression.  Physical Examination  Vitals:   09/04/20 1518  BP: (!) 207/120  Pulse: 94  Resp: 16  Weight: 244 lb (110.7 kg)   Body mass index is 35.01 kg/m. Gen: WD/WN, NAD Head: [place]/AT, No temporalis wasting.  Ear/Nose/Throat: Hearing grossly intact, nares w/o erythema or drainage Eyes: PER, EOMI, sclera nonicteric.  Neck: Supple, no large masses.   Pulmonary:  Good air movement, no audible wheezing bilaterally, no use of accessory muscles.  Cardiac: RRR, no JVD Vascular:scattered varicosities present bilaterally.  Mild venous stasis changes to the legs bilaterally.  1-2+ soft pitting edema.  Right ankle ulcer clean and noninfected Vessel Right Left  Radial Palpable Palpable  PT Palpable Palpable  DP Palpable Palpable  Gastrointestinal: Non-distended. No guarding/no peritoneal signs.  Musculoskeletal: M/S 5/5 throughout.  No deformity or atrophy.  Neurologic: CN 2-12 intact. Symmetrical.  Speech is fluent. Motor exam as listed above. Psychiatric: Judgment intact, Mood & affect appropriate for pt's clinical situation. Dermatologic: + rashes + ulcers noted.  No changes consistent with cellulitis. Lymph : No lichenification or skin changes of chronic  lymphedema.  CBC Lab Results  Component Value Date   WBC 10.1 04/21/2019   HGB 13.4 04/21/2019   HCT 37.1 (L) 04/21/2019   MCV 83.4 04/21/2019   PLT 220 04/21/2019    BMET    Component Value Date/Time   NA 136 04/21/2019 0515   K 3.8 04/21/2019 0515   CL 103 04/21/2019 0515   CO2 25 04/21/2019 0515   GLUCOSE 229 (H) 04/21/2019 0515   BUN 12 04/21/2019 0515   CREATININE 0.64 04/21/2019 0515   CALCIUM 8.4 (L) 04/21/2019 0515   GFRNONAA >60 04/21/2019 0515   GFRAA >60 04/21/2019 0515   CrCl cannot be calculated (Patient's most recent lab result is older than the maximum 21 days allowed.).  COAG No results found for: INR, PROTIME  Radiology No results found.    Assessment/Plan 1. Ankle ulcer, right, with fat layer exposed ([place]) The ulcer appears to be healing although it is doing so slowly. His recent ABIs are normal with triphasic Doppler signals. Given these findings I would continue local wound care for now I do  not believe angiography is indicated at this time. Continue Silvadene cream.  2. Chronic venous insufficiency No surgery or intervention at this point in time.    I have had a long discussion with the patient regarding venous insufficiency and why it  causes symptoms. I have discussed with the patient the chronic skin changes that accompany venous insufficiency and the long term sequela such as infection and ulceration.  Patient will begin wearing graduated compression stockings class 1 (20-30 mmHg) or compression wraps on a daily basis a prescription was given. The patient will put the stockings on first thing in the morning and removing them in the evening. The patient is instructed specifically not to sleep in the stockings.    In addition, behavioral modification including several periods of elevation of the lower extremities during the day will be continued. I have demonstrated that proper elevation is a position with the ankles at heart level.  3.  Essential hypertension Continue antihypertensive medications as already ordered, these medications have been reviewed and there are no changes at this time.   4. Coronary artery disease of native artery of native heart with stable angina pectoris (HCC) Continue cardiac and antihypertensive medications as already ordered and reviewed, no changes at this time.  Continue statin as ordered and reviewed, no changes at this time  Nitrates PRN for chest pain   5. Type 2 diabetes mellitus without complication, unspecified whether long term insulin use (HCC) Continue hypoglycemic medications as already ordered, these medications have been reviewed and there are no changes at this time.  Hgb A1C to be monitored as already arranged by primary service     Hortencia Pilar, MD  09/04/2020 3:20 PM

## 2020-09-11 ENCOUNTER — Ambulatory Visit: Payer: Medicare Other | Attending: Family Medicine | Admitting: Physical Therapy

## 2020-09-11 ENCOUNTER — Other Ambulatory Visit: Payer: Self-pay

## 2020-09-11 ENCOUNTER — Encounter: Payer: Self-pay | Admitting: Physical Therapy

## 2020-09-11 DIAGNOSIS — G8929 Other chronic pain: Secondary | ICD-10-CM

## 2020-09-11 DIAGNOSIS — M5441 Lumbago with sciatica, right side: Secondary | ICD-10-CM | POA: Insufficient documentation

## 2020-09-11 NOTE — Therapy (Signed)
[facility] [address], [place], [address] Phone: [phone]   Fax:  [phone]  Physical Therapy Evaluation  Patient Details  Name: Gerald Ellis MRN: [id number] Date of Birth: [birth year] No data recorded  Encounter Date: 09/11/2020   PT End of Session - 09/11/20 1526    Visit Number 1    Number of Visits 17    Date for PT Re-Evaluation 11/10/20    PT Start Time 1300    PT Stop Time 1345    PT Time Calculation (min) 45 min    Activity Tolerance Patient tolerated treatment well    Behavior During Therapy [facility] for tasks assessed/performed           Past Medical History:  Diagnosis Date  . Benign enlargement of prostate   . Bronchitis   . Childhood asthma   . Diabetes mellitus without complication ([place])   . Environmental allergies   . Erectile dysfunction   . Headache   . Hemorrhoids   . Hypercholesteremia   . Hypertension   . Hypogonadism in male   . Lumbago   . Over weight     Past Surgical History:  Procedure Laterality Date  . BACK SURGERY  1992  . CHOLECYSTECTOMY    . COLONOSCOPY WITH PROPOFOL N/A 08/07/2015   Procedure: COLONOSCOPY WITH PROPOFOL;  Surgeon: Lollie Sails, MD;  Location: [facility] ENDOSCOPY;  Service: Endoscopy;  Laterality: N/A;  . COLONOSCOPY WITH PROPOFOL N/A 08/08/2015   Procedure: COLONOSCOPY WITH PROPOFOL;  Surgeon: Lollie Sails, MD;  Location: [facility] ENDOSCOPY;  Service: Endoscopy;  Laterality: N/A;  . COLONOSCOPY WITH PROPOFOL N/A 09/16/2017   Procedure: COLONOSCOPY WITH PROPOFOL;  Surgeon: Lollie Sails, MD;  Location: [facility] ENDOSCOPY;  Service: Endoscopy;  Laterality: N/A;  . CORONARY STENT INTERVENTION N/A 04/20/2019   Procedure: CORONARY STENT INTERVENTION;  Surgeon: Isaias Cowman, MD;  Location: [place] CV LAB;  Service: Cardiovascular;  Laterality: N/A;  . ESOPHAGOGASTRODUODENOSCOPY (EGD) WITH PROPOFOL N/A 08/07/2015   Procedure:  ESOPHAGOGASTRODUODENOSCOPY (EGD) WITH PROPOFOL;  Surgeon: Lollie Sails, MD;  Location: [facility] ENDOSCOPY;  Service: Endoscopy;  Laterality: N/A;  . ESOPHAGOGASTRODUODENOSCOPY (EGD) WITH PROPOFOL N/A 09/16/2017   Procedure: ESOPHAGOGASTRODUODENOSCOPY (EGD) WITH PROPOFOL;  Surgeon: Lollie Sails, MD;  Location: [facility] ENDOSCOPY;  Service: Endoscopy;  Laterality: N/A;  . LEFT HEART CATH AND CORONARY ANGIOGRAPHY Left 04/20/2019   Procedure: LEFT HEART CATH AND CORONARY ANGIOGRAPHY;  Surgeon: Isaias Cowman, MD;  Location: [place] CV LAB;  Service: Cardiovascular;  Laterality: Left;    There were no vitals filed for this visit.    Subjective Assessment - 09/11/20 1304    Pertinent History Patient is a 55 year old male presenting with chronic LBP with RLE rediculopathy since 1991. Reports back surgery (thinks this is laminectomy) in 1993.  Reports he has numbess in the posterior RLE that radiates down to his toes. Reports numbess/tingling is increased with bending forward, squatting, walking more than 2mins; pain is decreased by changing positions. Current pain level 7/10; worst 10/10, lowest 5/10. Patient is being seen by pain management and is on hydrocodone daily since 1991. 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 Has stairs to enter his home, has to use a handrail. No falls in past 6  months. Pt denies N/V, B&B changes, unexplained weight fluctuation, saddle paresthesia, fever, night sweats, or unrelenting night pain at this time.    Limitations Lifting;Walking;Sitting;Standing;House hold activities    How long can you sit comfortably? <6mins    How long can you stand comfortably? <40mins    How long can you walk comfortably? <41mins    Diagnostic tests none    Patient Stated Goals decrease pain     Currently in Pain? Yes    Pain Score 7     Pain Location Back    Pain Orientation Right    Pain Descriptors / Indicators Dull;Aching;Radiating    Pain Type Chronic pain    Pain Radiating Towards down RLE    Pain Onset More than a month ago    Pain Frequency Constant    Aggravating Factors  sitting, standing, walking, laying >68mins, stairs, bending forward, squatting    Pain Relieving Factors Pain medication    Effect of Pain on Daily Activities unable to complete ADLs without pain    Multiple Pain Sites No                OBJECTIVE  Mental Status Patient is oriented to person, place and time.  Recent memory is intact.  Remote memory is intact.  Attention span and concentration are intact.  Expressive speech is intact.  Patient's fund of knowledge is within normal limits for educational level.  SENSATION: Grossly intact to light touch bilateral LEs as determined by testing dermatomes L2-S2 Proprioception and hot/cold testing deferred on this date   MUSCULOSKELETAL: Tremor: None Bulk: Normal Tone: Normal No visible step-off along spinal column  Posture Lumbar lordosis: Increased Iliac crest height: equal bilaterally Lumbar lateral shift: negative Lower crossed syndrome (tight hip flexors and erector spinae; weak gluts and abs): positive  Gait RLE trendelenberg with compensated L trendelenberg; decreased lumbar rotation  10MWT: 0.87m/s Stair Negotiation: Leading with RLE step to for ascent and descent   Palpation  TTP at R sided lumbar paraspinals and QL with concordant pain sign. Concordant RLE pain with palpation to mid glute max over piriformis with palpable trigger points, and proximal hamstring  Tension without pain to R mid hamstring group and gastroc complex  Strength (out of 5) R/L 4*/4 Hip flexion 3+/4+ Hip ER 4/5 Hip IR 3+/4 Hip abduction 4/4+ Hip adduction 3+/4- Hip extension 5/5 Knee extension 5/5 Knee flexion 4/4 Ankle dorsiflexion 5/5 Ankle  plantarflexion 5 Trunk flexion 5 Trunk extension 5/5 Trunk rotation  *Indicates pain   AROM (degrees) R/L (all movements include overpressure unless otherwise stated) Lumbar forward flexion (65): WNL Lumbar extension (30): WNL  Lumbar lateral flexion (25): WNL Thoracic and Lumbar rotation (30 degrees): WNL pain with R rotation with overpressure Hip IR (0-45): 50% limited bilat Hip ER (0-45): 25% limited bilat Hip Flexion (0-125): approx 90d bilat Hip Abduction (0-40):WNL bilat Hip extension (0-15): WNL bilat *Indicates pain   PROM (degrees) PROM = AROM Except full bilat hip flex passively with pain to R side LBP with passive R hip flex   Repeated Movements No centralization or peripheralization of symptoms with repeated lumbar extension or flexion.    Muscle Length Hamstrings: shortened bilat [place]: shortened bilat Thomas:  Not tested Nicoletta Dress: WNL   Passive Accessory Intervertebral Motion (PAIVM) Pt denies reproduction of back pain with CPA L1-L5 and UPA bilaterally L1-L5. Generally hypomobile throughout  Passive Physiological Intervertebral Motion (PPIVM) Normal flexion and extension with PPIVM testing   SPECIAL TESTS Lumbar Radiculopathy and Discogenic:  Centralization and Peripheralization (SN 92, -LR 0.12): Negative Slump (SN 83, -LR 0.32): Negative bilat SLR (SN 92, -LR 0.29): Negative bilat Crossed SLR (SP 90): Negative bilat  Facet Joint: Extension-Rotation (SN 100, -LR 0.0): Positive with R rotation   Hip: FABER (SN 81): R:positive L negative FADIR (SN 94): R:positive L negative Hip scour (SN 50): positive bilat  SIJ:  Thigh Thrust (SN 88, -LR 0.18) : Negative bilat  Piriformis Syndrome: FAIR Test (SN 88, SP 83): R:positive L negative  Functional Tasks Sit to stand: use of UE evident with post trunk lean until stance  Lateral Step-Down Test: bilat hip drop, knee valgus, unable to lower with control from 6in step   Ther-Ex PT reviewed the following  HEP with patient with patient able to demonstrate a set of the following with min cuing for correction needed. PT educated patient on parameters of therex (how/when to inc/decrease intensity, frequency, rep/set range, stretch hold time, and purpose of therex) with verbalized understanding.  Access Code: [email or web address] Supine Lower Trunk Rotation - 2-3 x daily - 7 x weekly - 3 sets - 10 reps Supine Double Knee to Chest - 2-3 x daily - 7 x weekly - 3 reps - 10sec hold Seated Child's Pose with Table - 2-3 x daily - 7 x weekly - 12 reps - 2-3sec hold              Objective measurements completed on examination: See above findings.               PT Education - 09/11/20 1316    Education Details Patient was educated on diagnosis, anatomy and pathology involved, prognosis, role of PT, and was given an HEP, demonstrating exercise with proper form following verbal and tactile cues, and was given a paper hand out to continue exercise at home. Pt was educated on and agreed to plan of care.    Person(s) Educated Patient    Methods Explanation;Demonstration;Tactile cues;Verbal cues;Handout    Comprehension Tactile cues required;Verbal cues required;Returned demonstration;Verbalized understanding            PT Short Term Goals - 09/11/20 1551      PT SHORT TERM GOAL #1   Title Pt will be independent with HEP in order to improve strength and decrease back pain in order to improve pain-free function at home and work. P    Baseline 09/11/20    Time 8    Period Weeks    Status New             PT Long Term Goals - 09/11/20 1552      PT LONG TERM GOAL #1   Title Pt will decrease worst back pain as reported on NPRS by at least 2 points in order to demonstrate clinically significant reduction in back pain.    Baseline 09/11/20 10/10 worst pain    Time 8    Period Weeks    Status New      PT LONG TERM GOAL #2   Title Pt will demonstrate 10MWT speed of 1.57m/s or more in order to  demonstrate safe speed for community ambulation    Baseline 09/11/20 0.75m/s    Time 8    Period Weeks    Status New      PT LONG TERM GOAL #3   Title Patient will increase FOTO score to 79 to demonstrate predicted increase in functional mobility to complete ADLs    Baseline 09/11/20 30    Time 8  Period Weeks    Status New      PT LONG TERM GOAL #4   Title Pt will demonstrate normalized stair ambulation (4) with reciprocol pattern and unilateral handrail to demonstrate safe, efficient mechanics for stair negotation at home.    Baseline 09/11/20 step to pattern for ascent and descent with RLE leading.    Time 8    Period Weeks    Status New                  Plan - 09/11/20 1527    Clinical Impression Statement Pt is a 55 year old male presenting with chronic R sided LBP with RLE pain as well. Physical examination does not reveal conclusive nerve involvement from lumbar spine indicating RLE pain. Impairments in decreased lumbar mobility (all directions, pain with R rotation), decreased hip mobility (R>L), decreased hip strength (R>L), LBP, sensation deficits, gait abnormalities, and decreased motor control. Activity limitations in prolonged sitting and standing, household ambulation, stair negotiation, squatting, forward bending/reaching, tying shoes, donning/doffing pants, and lifting; inhibiting participation in household ADLs and community errands. Pt will benefit from skilled PT to address these impairments to return patient to optimal level of function.    Personal Factors and Comorbidities Comorbidity 1;Comorbidity 2;Comorbidity 3+;Past/Current Experience;Fitness;Time since onset of injury/illness/exacerbation;Age    Comorbidities DM, HTN, CAD, DDD, chronic LBP since 2 segment lumbar laminectomy in 1993    Examination-Activity Limitations Lift;Squat;Stairs;Stand;Locomotion Level;Reach Overhead;Carry;Sit;Transfers;Dressing;Sleep    Examination-Participation Restrictions  Cleaning;Laundry;Community Activity;Meal Prep;Occupation;Yard Work    [employer] Evolving/Moderate complexity    Clinical Decision Making Moderate    Rehab Potential Good    PT Frequency 2x / week    PT Duration 8 weeks    PT Treatment/Interventions ADLs/Self Care Home Management;Electrical Stimulation;Therapeutic activities;Patient/family education;Energy conservation;Spinal Manipulations;Joint Manipulations;Passive range of motion;Aquatic Therapy;Iontophoresis 4mg /ml Dexamethasone;DME Instruction;Therapeutic exercise;Vestibular;Taping;Dry needling;Manual techniques;Functional mobility training;Ultrasound;Cryotherapy;Traction;Moist Heat;Stair training;Gait training;Balance training;Neuromuscular re-education    PT Next Visit Plan 5xSTS, SLS balance, lateral step down    PT Home Exercise Plan lower lumbar rotations, DKTC, Childs pose    Consulted and Agree with Plan of Care Patient           Patient will benefit from skilled therapeutic intervention in order to improve the following deficits and impairments:  Decreased balance,Difficulty walking,Impaired flexibility,Obesity,Decreased activity tolerance,Decreased endurance,Decreased range of motion,Decreased strength,Hypomobility,Decreased coordination,Decreased mobility,Increased muscle spasms,Postural dysfunction  Visit Diagnosis: Chronic right-sided low back pain with right-sided sciatica     Problem List Patient Active Problem List   Diagnosis Date Noted  . Chronic venous insufficiency 06/29/2020  . Lymphedema 06/29/2020  . PAD (peripheral artery disease) ([place]) 05/30/2020  . Ankle ulcer, right, with fat layer exposed ([place]) 05/30/2020  . Allergy 05/29/2020  . Asthma without status asthmaticus 05/29/2020  . Diabetes mellitus type 2, uncomplicated ([place]) [id number]  . S/P coronary artery stent placement 04/30/2019  . CAD (coronary artery disease) 04/20/2019  . Abnormal ECG 03/18/2019  . Chest pain with high  risk for cardiac etiology 03/18/2019  . SOB (shortness of breath) on exertion 03/18/2019  . Hypercholesterolemia 05/07/2015  . 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  . Hypogonadism in male 11/22/2014  . BPH with obstruction/lower urinary tract symptoms 11/22/2014  . DDD (degenerative disc disease), lumbar 10/13/2014  . Status post lumbar laminectomy 10/13/2014  . DJD of shoulder 10/13/2014  . Bilateral occipital neuralgia 10/13/2014  . Lumbar radiculopathy 10/13/2014  . DDD (degenerative disc disease), cervical 10/13/2014  . Depression 08/25/2013  . Essential hypertension 08/25/2013  . History  of hemorrhoids 08/25/2013  . Low back pain 08/25/2013   Durwin Reges DPT Durwin Reges 09/11/2020, 4:00 PM  [facility] [address], [place], [address] Phone: [phone]   Fax:  [phone]  Name: JOAL EAKLE MRN: [id number] Date of Birth: Apr 24, 1966

## 2020-09-12 ENCOUNTER — Encounter (INDEPENDENT_AMBULATORY_CARE_PROVIDER_SITE_OTHER): Payer: Self-pay | Admitting: Vascular Surgery

## 2020-09-13 ENCOUNTER — Ambulatory Visit: Payer: Medicare Other | Admitting: Physical Therapy

## 2020-09-13 ENCOUNTER — Encounter: Payer: Self-pay | Admitting: Physical Therapy

## 2020-09-13 ENCOUNTER — Other Ambulatory Visit: Payer: Self-pay

## 2020-09-13 DIAGNOSIS — M5441 Lumbago with sciatica, right side: Secondary | ICD-10-CM | POA: Diagnosis not present

## 2020-09-13 DIAGNOSIS — G8929 Other chronic pain: Secondary | ICD-10-CM

## 2020-09-13 NOTE — Therapy (Signed)
[place] [address] [place], [address] Phone: [phone]   Fax:  [phone]  Physical Therapy Treatment  Patient Details  Name: Gerald Ellis MRN: [id number] Date of Birth: 06-Jun-1965 No data recorded  Encounter Date: 09/13/2020   PT End of Session - 09/13/20 1313    Visit Number 2    Number of Visits 17    Date for PT Re-Evaluation 11/10/20    PT Start Time 1300    PT Stop Time 1340    PT Time Calculation (min) 40 min    Activity Tolerance Patient tolerated treatment well    Behavior During Therapy [facility] for tasks assessed/performed           Past Medical History:  Diagnosis Date  . Benign enlargement of prostate   . Bronchitis   . Childhood asthma   . Diabetes mellitus without complication ([place])   . Environmental allergies   . Erectile dysfunction   . Headache   . Hemorrhoids   . Hypercholesteremia   . Hypertension   . Hypogonadism in male   . Lumbago   . Over weight     Past Surgical History:  Procedure Laterality Date  . BACK SURGERY  1992  . CHOLECYSTECTOMY    . COLONOSCOPY WITH PROPOFOL N/A 08/07/2015   Procedure: COLONOSCOPY WITH PROPOFOL;  Surgeon: Lollie Sails, MD;  Location: [facility] ENDOSCOPY;  Service: Endoscopy;  Laterality: N/A;  . COLONOSCOPY WITH PROPOFOL N/A 08/08/2015   Procedure: COLONOSCOPY WITH PROPOFOL;  Surgeon: Lollie Sails, MD;  Location: [facility] ENDOSCOPY;  Service: Endoscopy;  Laterality: N/A;  . COLONOSCOPY WITH PROPOFOL N/A 09/16/2017   Procedure: COLONOSCOPY WITH PROPOFOL;  Surgeon: Lollie Sails, MD;  Location: [facility] ENDOSCOPY;  Service: Endoscopy;  Laterality: N/A;  . CORONARY STENT INTERVENTION N/A 04/20/2019   Procedure: CORONARY STENT INTERVENTION;  Surgeon: Isaias Cowman, MD;  Location: [place] CV LAB;  Service: Cardiovascular;  Laterality: N/A;  . ESOPHAGOGASTRODUODENOSCOPY (EGD) WITH PROPOFOL N/A 08/07/2015   Procedure:  ESOPHAGOGASTRODUODENOSCOPY (EGD) WITH PROPOFOL;  Surgeon: Lollie Sails, MD;  Location: [facility] ENDOSCOPY;  Service: Endoscopy;  Laterality: N/A;  . ESOPHAGOGASTRODUODENOSCOPY (EGD) WITH PROPOFOL N/A 09/16/2017   Procedure: ESOPHAGOGASTRODUODENOSCOPY (EGD) WITH PROPOFOL;  Surgeon: Lollie Sails, MD;  Location: [facility] ENDOSCOPY;  Service: Endoscopy;  Laterality: N/A;  . LEFT HEART CATH AND CORONARY ANGIOGRAPHY Left 04/20/2019   Procedure: LEFT HEART CATH AND CORONARY ANGIOGRAPHY;  Surgeon: Isaias Cowman, MD;  Location: [place] CV LAB;  Service: Cardiovascular;  Laterality: Left;    There were no vitals filed for this visit.   Subjective Assessment - 09/13/20 1303    Subjective Patient reports 5/10 pain today, has been completing HEP, does not notice a difference yet.    Pertinent History Patient is a 55 year old male presenting with chronic LBP with RLE rediculopathy since 1991. Reports back surgery (thinks this is laminectomy) in 1993.  Reports he has numbess in the posterior RLE that radiates down to his toes. Reports numbess/tingling is increased with bending forward, squatting, walking more than 22mins; pain is decreased by changing positions. Current pain level 7/10; worst 10/10, lowest 5/10. Patient is being seen by pain management and is on hydrocodone daily since 1991. 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 Has stairs to enter his home, has to use a handrail. No falls in past 6 months. Pt denies N/V, B&B changes, unexplained weight fluctuation, saddle paresthesia, fever, night sweats, or unrelenting night pain at this time.    Limitations Lifting;Walking;Sitting;Standing;House hold activities    How long can you sit comfortably? <5mins    How long can you stand comfortably? <60mins    How long  can you walk comfortably? <60mins    Diagnostic tests none    Patient Stated Goals decrease pain    Pain Onset More than a month ago          Ther-Ex Nustep seat 9 UE 10 L4 87mins for gentle motion and strengthening  Supine Lower Trunk Rotation x20 with pain in R side of LB with L rotation Supine Double Knee to Chest 2x 10sec hold with correction for HEP needed SKTC x10sec bilat with pain with R SKTC  Open book with hand behind head x12 bilat with difficulty/decreased mobility  with L rotation Hip flex with RLE on chair to elevate >90d with mulligan belt for inferior distraction x6; with lumbar flex x12 with 2-3sec hold (130d hip flex measured)- added to HEP Bridge 2x 10 with min cuing initially for proper technique with good carry over; cuing for even push (decreased push from R hip) - added to HEP B  Manual Long axis R hip distraction x10 20sec distraction  Posterior hip mobilization x30sec G2 with good pain reduction in 90d flex; with G3 with movement into flexion/IR/ER 2x 30sec                           PT Education - 09/13/20 1312    Education Details therex form/technique    Person(s) Educated Patient    Methods Explanation;Demonstration;Verbal cues    Comprehension Verbalized understanding;Returned demonstration;Verbal cues required            PT Short Term Goals - 09/11/20 1551      PT SHORT TERM GOAL #1   Title Pt will be independent with HEP in order to improve strength and decrease back pain in order to improve pain-free function at home and work. P    Baseline 09/11/20    Time 8    Period Weeks    Status New             PT Long Term Goals - 09/11/20 1552      PT LONG TERM GOAL #1   Title Pt will decrease worst back pain as reported on NPRS by at least 2 points in order to demonstrate clinically significant reduction in back pain.    Baseline 09/11/20 10/10 worst pain    Time 8    Period Weeks    Status New      PT LONG TERM GOAL #2    Title Pt will demonstrate 10MWT speed of 1.26m/s or more in order to demonstrate safe speed for community ambulation    Baseline 09/11/20 0.15m/s    Time 8    Period Weeks    Status New      PT LONG TERM GOAL #3   Title Patient will increase FOTO score to 79 to demonstrate predicted increase in functional mobility to complete ADLs    Baseline 09/11/20 30    Time 8    Period Weeks    Status New      PT LONG TERM GOAL #4   Title Pt will  demonstrate normalized stair ambulation (4) with reciprocol pattern and unilateral handrail to demonstrate safe, efficient mechanics for stair negotation at home.    Baseline 09/11/20 step to pattern for ascent and descent with RLE leading.    Time 8    Period Weeks    Status New                 Plan - 09/13/20 1344    Clinical Impression Statement [place] with increased R hip pain today, probable from increased OA of R hip, with pain that responds well to manual techniques. PT updated HEP to reflect continued hip and lumbar mobility focus, and increased hip strengthening with addition of bridge exercise. Patient is able to demonstrate good carry over of all cuing for proper technique of therex with good carry over, allowing for increased R hip flex ROM (130d AAROM at end of session). PT will continue progression as able.    Personal Factors and Comorbidities Comorbidity 1;Comorbidity 2;Comorbidity 3+;Past/Current Experience;Fitness;Time since onset of injury/illness/exacerbation;Age    Comorbidities DM, HTN, CAD, DDD, chronic LBP since 2 segment lumbar laminectomy in 1993    Examination-Activity Limitations Lift;Squat;Stairs;Stand;Locomotion Level;Reach Overhead;Carry;Sit;Transfers;Dressing;Sleep    Examination-Participation Restrictions Cleaning;Laundry;Community Activity;Meal Prep;Occupation;Yard Work    [employer] officer Evolving/Moderate complexity    Clinical Decision Making Moderate    Rehab Potential Good    PT Frequency 2x  / week    PT Duration 8 weeks    PT Treatment/Interventions ADLs/Self Care Home Management;Electrical Stimulation;Therapeutic activities;Patient/family education;Energy conservation;Spinal Manipulations;Joint Manipulations;Passive range of motion;Aquatic Therapy;Iontophoresis 4mg /ml Dexamethasone;DME Instruction;Therapeutic exercise;Vestibular;Taping;Dry needling;Manual techniques;Functional mobility training;Ultrasound;Cryotherapy;Traction;Moist Heat;Stair training;Gait training;Balance training;Neuromuscular re-education    PT Next Visit Plan 5xSTS, SLS balance, lateral step down    PT Home Exercise Plan lower lumbar rotations, DKTC, Childs pose    Consulted and Agree with Plan of Care Patient           Patient will benefit from skilled therapeutic intervention in order to improve the following deficits and impairments:  Decreased balance,Difficulty walking,Impaired flexibility,Obesity,Decreased activity tolerance,Decreased endurance,Decreased range of motion,Decreased strength,Hypomobility,Decreased coordination,Decreased mobility,Increased muscle spasms,Postural dysfunction  Visit Diagnosis: Chronic right-sided low back pain with right-sided sciatica     Problem List Patient Active Problem List   Diagnosis Date Noted  . Chronic venous insufficiency 06/29/2020  . Lymphedema 06/29/2020  . PAD (peripheral artery disease) ([place]) 05/30/2020  . Ankle ulcer, right, with fat layer exposed ([place]) 05/30/2020  . Allergy 05/29/2020  . Asthma without status asthmaticus 05/29/2020  . Diabetes mellitus type 2, uncomplicated ([place]) 12/45/8099  . S/P coronary artery stent placement 04/30/2019  . CAD (coronary artery disease) 04/20/2019  . Abnormal ECG 03/18/2019  . Chest pain with high risk for cardiac etiology 03/18/2019  . SOB (shortness of breath) on exertion 03/18/2019  . Hypercholesterolemia 05/07/2015  . 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  . Hypogonadism in male 11/22/2014   . BPH with obstruction/lower urinary tract symptoms 11/22/2014  . DDD (degenerative disc disease), lumbar 10/13/2014  . Status post lumbar laminectomy 10/13/2014  . DJD of shoulder 10/13/2014  . Bilateral occipital neuralgia 10/13/2014  . Lumbar radiculopathy 10/13/2014  . DDD (degenerative disc disease), cervical 10/13/2014  . Depression 08/25/2013  . Essential hypertension 08/25/2013  . History of hemorrhoids 08/25/2013  . Low back pain 08/25/2013   Durwin Reges DPT Durwin Reges 09/13/2020, 2:20 PM  [place] [facility] [address], [place], [address] Phone: [phone]   Fax:  [phone]  Name: Gerald Chock  Ellis MRN: [id number] Date of Birth: 1965-11-13

## 2020-09-18 ENCOUNTER — Other Ambulatory Visit: Payer: Self-pay

## 2020-09-18 ENCOUNTER — Encounter: Payer: Self-pay | Admitting: Physical Therapy

## 2020-09-18 ENCOUNTER — Ambulatory Visit: Payer: Medicare Other | Attending: Family Medicine | Admitting: Physical Therapy

## 2020-09-18 DIAGNOSIS — G8929 Other chronic pain: Secondary | ICD-10-CM

## 2020-09-18 DIAGNOSIS — M5441 Lumbago with sciatica, right side: Secondary | ICD-10-CM | POA: Insufficient documentation

## 2020-09-18 NOTE — Therapy (Signed)
[facility] [facility] [address], [place], [address] Phone: [phone]   Fax:  [phone]  Physical Therapy Treatment  Patient Details  Name: Gerald Ellis MRN: [id number] Date of Birth: 1965/11/12 No data recorded  Encounter Date: 09/18/2020   PT End of Session - 09/18/20 1356    Visit Number 3    Number of Visits 17    Date for PT Re-Evaluation 11/10/20    PT Start Time 1884    PT Stop Time 1422    PT Time Calculation (min) 39 min    Activity Tolerance Patient tolerated treatment well    Behavior During Therapy [facility] for tasks assessed/performed           Past Medical History:  Diagnosis Date  . Benign enlargement of prostate   . Bronchitis   . Childhood asthma   . Diabetes mellitus without complication ([place])   . Environmental allergies   . Erectile dysfunction   . Headache   . Hemorrhoids   . Hypercholesteremia   . Hypertension   . Hypogonadism in male   . Lumbago   . Over weight     Past Surgical History:  Procedure Laterality Date  . BACK SURGERY  1992  . CHOLECYSTECTOMY    . COLONOSCOPY WITH PROPOFOL N/A 08/07/2015   Procedure: COLONOSCOPY WITH PROPOFOL;  Surgeon: Lollie Sails, MD;  Location: [facility] ENDOSCOPY;  Service: Endoscopy;  Laterality: N/A;  . COLONOSCOPY WITH PROPOFOL N/A 08/08/2015   Procedure: COLONOSCOPY WITH PROPOFOL;  Surgeon: Lollie Sails, MD;  Location: [facility] ENDOSCOPY;  Service: Endoscopy;  Laterality: N/A;  . COLONOSCOPY WITH PROPOFOL N/A 09/16/2017   Procedure: COLONOSCOPY WITH PROPOFOL;  Surgeon: Lollie Sails, MD;  Location: [facility] ENDOSCOPY;  Service: Endoscopy;  Laterality: N/A;  . CORONARY STENT INTERVENTION N/A 04/20/2019   Procedure: CORONARY STENT INTERVENTION;  Surgeon: Isaias Cowman, MD;  Location: [place] CV LAB;  Service: Cardiovascular;  Laterality: N/A;  . ESOPHAGOGASTRODUODENOSCOPY (EGD) WITH PROPOFOL N/A 08/07/2015   Procedure:  ESOPHAGOGASTRODUODENOSCOPY (EGD) WITH PROPOFOL;  Surgeon: Lollie Sails, MD;  Location: [facility] ENDOSCOPY;  Service: Endoscopy;  Laterality: N/A;  . ESOPHAGOGASTRODUODENOSCOPY (EGD) WITH PROPOFOL N/A 09/16/2017   Procedure: ESOPHAGOGASTRODUODENOSCOPY (EGD) WITH PROPOFOL;  Surgeon: Lollie Sails, MD;  Location: [facility] ENDOSCOPY;  Service: Endoscopy;  Laterality: N/A;  . LEFT HEART CATH AND CORONARY ANGIOGRAPHY Left 04/20/2019   Procedure: LEFT HEART CATH AND CORONARY ANGIOGRAPHY;  Surgeon: Isaias Cowman, MD;  Location: [place] CV LAB;  Service: Cardiovascular;  Laterality: Left;    There were no vitals filed for this visit.   Subjective Assessment - 09/18/20 1341    Subjective Patient reports his pain is about the same today, maybe a little worse than last time, reporting 7/10 pain today. He says the more he exercises the more pressure he feels in his back. PT asked if this is more of a muscle soreness than pain, patient is unable to differeniate.    Pertinent History Patient is a 55 year old male presenting with chronic LBP with RLE rediculopathy since 1991. Reports back surgery (thinks this is laminectomy) in 1993.  Reports he has numbess in the posterior RLE that radiates down to his toes. Reports numbess/tingling is increased with bending forward, squatting, walking more than 51mins; pain is decreased by changing positions. Current pain level 7/10; worst 10/10, lowest 5/10. Patient is being seen by pain management and is on hydrocodone daily since 1991. 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 Has stairs to enter his home, has to use a handrail. No falls in past 6 months. Pt denies N/V, B&B changes, unexplained weight fluctuation, saddle paresthesia, fever, night sweats, or unrelenting night pain at this  time.    Limitations Lifting;Walking;Sitting;Standing;House hold activities    How long can you sit comfortably? <53mins    How long can you stand comfortably? <80mins    How long can you walk comfortably? <57mins    Diagnostic tests none    Patient Stated Goals decrease pain    Pain Onset More than a month ago                     Ther-Ex Nustep seat 9 UE 10 L4 62mins for gentle motion and strengthening  Supine Lower Trunk Rotation x20 with pain in R side of LB with L rotation Supine Double Knee to Chest 2x 10sec hold with correction for HEP needed SKTC x30sec in hooklying; in near CLLE ext x30sec bilat; with CLLE fulling ext x30sec with stretch at extended hip flexor Prone press cobra stretch x12 3sec hold with good stretch sensation felt Half kneeling hip flexor stretch x12 3sec; following hold 1x 30sec Articulating bridge with prior PPT x12 with max cuing for glute activation without lumbar ext Postural education in standing with techniques from articulating bridge, with good carry over momentarily .  Manual Long axis R hip distraction x10 20sec distraction  Posterior hip mobilization x30sec G2 with good pain reduction in 90d flex; with G3 with movement into flexion/IR/ER 2x 30sec          PT Education - 09/18/20 1349    Education Details therex form/technique    Person(s) Educated Patient    Methods Explanation;Demonstration;Verbal cues    Comprehension Verbalized understanding;Returned demonstration;Verbal cues required            PT Short Term Goals - 09/11/20 1551      PT SHORT TERM GOAL #1   Title Pt will be independent with HEP in order to improve strength and decrease back pain in order to improve pain-free function at home and work. P    Baseline 09/11/20    Time 8    Period Weeks    Status New             PT Long Term Goals - 09/11/20 1552      PT LONG TERM GOAL #1   Title Pt will decrease worst back pain as reported on NPRS by at least 2  points in order to demonstrate clinically significant reduction in back pain.    Baseline 09/11/20 10/10 worst pain    Time 8    Period Weeks    Status New      PT LONG TERM GOAL #2   Title Pt will demonstrate 10MWT speed of 1.73m/s or more in order to demonstrate safe speed for community ambulation    Baseline 09/11/20 0.54m/s    Time 8    Period Weeks    Status New      PT LONG TERM GOAL #3   Title Patient will increase FOTO score to 79 to demonstrate predicted increase in functional mobility to complete ADLs    Baseline 09/11/20 30    Time 8  Period Weeks    Status New      PT LONG TERM GOAL #4   Title Pt will demonstrate normalized stair ambulation (4) with reciprocol pattern and unilateral handrail to demonstrate safe, efficient mechanics for stair negotation at home.    Baseline 09/11/20 step to pattern for ascent and descent with RLE leading.    Time 8    Period Weeks    Status New                 Plan - 09/18/20 1422    Clinical Impression Statement Pt reports pain is relieved from 7/10 to 5/10 following session. Patient reports good pain relief from manual techniques. Patient demonstrates increased hip flexor tension bilat, with decreased glute max/hip ext activation and strength, leading to lower crossed posture, and increased activation of lumbar extensors to lift, stand, etc. PT spend time educating patient on posterior pelvic mobility, with icnreased hip ext and core activation to allow for neutral lumbar spine, and on standing posture, and utliizing glute max for STS and lifting. Pt demonstrates and verbalizes understanding. PT encouraged patient to continue trying to attempt this posture as often as possible. PT will continue progression as able.    Personal Factors and Comorbidities Comorbidity 1;Comorbidity 2;Comorbidity 3+;Past/Current Experience;Fitness;Time since onset of injury/illness/exacerbation;Age    Comorbidities DM, HTN, CAD, DDD, chronic LBP since 2  segment lumbar laminectomy in 1993    Examination-Activity Limitations Lift;Squat;Stairs;Stand;Locomotion Level;Reach Overhead;Carry;Sit;Transfers;Dressing;Sleep    Examination-Participation Restrictions Cleaning;Laundry;Community Activity;Meal Prep;Occupation;Yard Work    [employer] officer Evolving/Moderate complexity    Clinical Decision Making Moderate    Rehab Potential Good    PT Frequency 2x / week    PT Duration 8 weeks    PT Treatment/Interventions ADLs/Self Care Home Management;Electrical Stimulation;Therapeutic activities;Patient/family education;Energy conservation;Spinal Manipulations;Joint Manipulations;Passive range of motion;Aquatic Therapy;Iontophoresis 4mg /ml Dexamethasone;DME Instruction;Therapeutic exercise;Vestibular;Taping;Dry needling;Manual techniques;Functional mobility training;Ultrasound;Cryotherapy;Traction;Moist Heat;Stair training;Gait training;Balance training;Neuromuscular re-education    PT Next Visit Plan 5xSTS, SLS balance, lateral step down    PT Home Exercise Plan lower lumbar rotations, DKTC, Childs pose    Consulted and Agree with Plan of Care Patient           Patient will benefit from skilled therapeutic intervention in order to improve the following deficits and impairments:  Decreased balance,Difficulty walking,Impaired flexibility,Obesity,Decreased activity tolerance,Decreased endurance,Decreased range of motion,Decreased strength,Hypomobility,Decreased coordination,Decreased mobility,Increased muscle spasms,Postural dysfunction  Visit Diagnosis: Chronic right-sided low back pain with right-sided sciatica     Problem List Patient Active Problem List   Diagnosis Date Noted  . Chronic venous insufficiency 06/29/2020  . Lymphedema 06/29/2020  . PAD (peripheral artery disease) ([place]) 05/30/2020  . Ankle ulcer, right, with fat layer exposed ([place]) 05/30/2020  . Allergy 05/29/2020  . Asthma without status asthmaticus 05/29/2020  .  Diabetes mellitus type 2, uncomplicated ([place]) 01/75/1025  . S/P coronary artery stent placement 04/30/2019  . CAD (coronary artery disease) 04/20/2019  . Abnormal ECG 03/18/2019  . Chest pain with high risk for cardiac etiology 03/18/2019  . SOB (shortness of breath) on exertion 03/18/2019  . Hypercholesterolemia 05/07/2015  . 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  . Hypogonadism in male 11/22/2014  . BPH with obstruction/lower urinary tract symptoms 11/22/2014  . DDD (degenerative disc disease), lumbar 10/13/2014  . Status post lumbar laminectomy 10/13/2014  . DJD of shoulder 10/13/2014  . Bilateral occipital neuralgia 10/13/2014  . Lumbar radiculopathy 10/13/2014  . DDD (degenerative disc disease), cervical 10/13/2014  . Depression 08/25/2013  . Essential hypertension 08/25/2013  .  History of hemorrhoids 08/25/2013  . Low back pain 08/25/2013   Gerald Ellis DPT Gerald Ellis 09/18/2020, 2:34 PM   [facility] [address] [place], [address] Phone: [phone]   Fax:  [phone]  Name: Gerald Ellis MRN: [id number] Date of Birth: August 28, 1965

## 2020-09-20 ENCOUNTER — Ambulatory Visit: Payer: Medicare Other | Admitting: Physical Therapy

## 2020-09-25 ENCOUNTER — Encounter: Payer: Self-pay | Admitting: Physical Therapy

## 2020-09-25 ENCOUNTER — Ambulatory Visit: Payer: Medicare Other | Admitting: Physical Therapy

## 2020-09-25 ENCOUNTER — Other Ambulatory Visit: Payer: Self-pay

## 2020-09-25 DIAGNOSIS — G8929 Other chronic pain: Secondary | ICD-10-CM

## 2020-09-25 DIAGNOSIS — M5441 Lumbago with sciatica, right side: Secondary | ICD-10-CM | POA: Diagnosis not present

## 2020-09-25 NOTE — Therapy (Addendum)
[facility] [address] [place], [address] Phone: [phone]   Fax:  [phone]  Physical Therapy Treatment  Patient Details  Name: Gerald Ellis MRN: [id number] Date of Birth: August 19, 1965 No data recorded  Encounter Date: 09/25/2020   PT End of Session - 09/25/20 1512    Visit Number 4    Number of Visits 17    Date for PT Re-Evaluation 11/10/20    PT Start Time 1430    PT Stop Time 1515    PT Time Calculation (min) 45 min    Activity Tolerance Patient tolerated treatment well    Behavior During Therapy [facility] for tasks assessed/performed           Past Medical History:  Diagnosis Date  . Benign enlargement of prostate   . Bronchitis   . Childhood asthma   . Diabetes mellitus without complication ([place])   . Environmental allergies   . Erectile dysfunction   . Headache   . Hemorrhoids   . Hypercholesteremia   . Hypertension   . Hypogonadism in male   . Lumbago   . Over weight     Past Surgical History:  Procedure Laterality Date  . BACK SURGERY  1992  . CHOLECYSTECTOMY    . COLONOSCOPY WITH PROPOFOL N/A 08/07/2015   Procedure: COLONOSCOPY WITH PROPOFOL;  Surgeon: Lollie Sails, MD;  Location: [facility] ENDOSCOPY;  Service: Endoscopy;  Laterality: N/A;  . COLONOSCOPY WITH PROPOFOL N/A 08/08/2015   Procedure: COLONOSCOPY WITH PROPOFOL;  Surgeon: Lollie Sails, MD;  Location: [facility] ENDOSCOPY;  Service: Endoscopy;  Laterality: N/A;  . COLONOSCOPY WITH PROPOFOL N/A 09/16/2017   Procedure: COLONOSCOPY WITH PROPOFOL;  Surgeon: Lollie Sails, MD;  Location: [facility] ENDOSCOPY;  Service: Endoscopy;  Laterality: N/A;  . CORONARY STENT INTERVENTION N/A 04/20/2019   Procedure: CORONARY STENT INTERVENTION;  Surgeon: Isaias Cowman, MD;  Location: [place] CV LAB;  Service: Cardiovascular;  Laterality: N/A;  . ESOPHAGOGASTRODUODENOSCOPY (EGD) WITH PROPOFOL N/A 08/07/2015   Procedure:  ESOPHAGOGASTRODUODENOSCOPY (EGD) WITH PROPOFOL;  Surgeon: Lollie Sails, MD;  Location: [facility] ENDOSCOPY;  Service: Endoscopy;  Laterality: N/A;  . ESOPHAGOGASTRODUODENOSCOPY (EGD) WITH PROPOFOL N/A 09/16/2017   Procedure: ESOPHAGOGASTRODUODENOSCOPY (EGD) WITH PROPOFOL;  Surgeon: Lollie Sails, MD;  Location: [facility] ENDOSCOPY;  Service: Endoscopy;  Laterality: N/A;  . LEFT HEART CATH AND CORONARY ANGIOGRAPHY Left 04/20/2019   Procedure: LEFT HEART CATH AND CORONARY ANGIOGRAPHY;  Surgeon: Isaias Cowman, MD;  Location: [place] CV LAB;  Service: Cardiovascular;  Laterality: Left;    There were no vitals filed for this visit.   Subjective Assessment - 09/25/20 1435    Subjective Patient arrives to PT emotional d/t a rough weekend with his wife's detoriorating mental health. He is stressed by all his healthcare appointments (had to miss one this am d/t being late helping a neighbor), his wife's mental health care, and it is clearly taking a toll on him. His back pain is made worse by all this stress, unable to express numeric scale.    Pertinent History Patient is a 55 year old male presenting with chronic LBP with RLE rediculopathy since 1991. Reports back surgery (thinks this is laminectomy) in 1993.  Reports he has numbess in the posterior RLE that radiates down to his toes. Reports numbess/tingling is increased with bending forward, squatting, walking more than 70mins; pain is decreased by changing positions. Current pain level 7/10; worst 10/10, lowest 5/10. Patient is being seen  by pain management and is on hydrocodone daily since 1991. 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 Has stairs to enter his home, has to use a handrail. No falls in past 6 months. Pt denies N/V, B&B changes, unexplained weight  fluctuation, saddle paresthesia, fever, night sweats, or unrelenting night pain at this time.    Limitations Lifting;Walking;Sitting;Standing;House hold activities    How long can you sit comfortably? <47mins    How long can you stand comfortably? <48mins    How long can you walk comfortably? <63mins    Diagnostic tests none    Patient Stated Goals decrease pain    Pain Onset More than a month ago            Ther-Ex Nustep seat 9 UE 10 L4 76mins for gentle motion and strengthening Supine Lower Trunk Rotationx20 with pain in R side of LB with L rotation Supine Double Knee to Chest2x 10sec hold with correction for HEP needed SKTC x30sec in hooklying; in near CLLE ext x30sec bilat; with CLLE fulling ext x30sec with stretch at extended hip flexor Open books x12 bilat with cuing for breath control Thomas stretch 65min bilat  Manual Long axis R hip distraction x10 20sec distraction  Posterior hip mobilization x30sec G2 with good pain reduction in 90d flex; with G3 with movement into flexion/IR/ER2x 30sec lumbar distraction with theraball 10x 10sec distractions; 22min with distraction with lumbar rotations                         PT Education - 09/25/20 1511    Education Details therex form/technique    Person(s) Educated Patient    Methods Explanation;Verbal cues    Comprehension Verbalized understanding;Verbal cues required            PT Short Term Goals - 09/11/20 1551      PT SHORT TERM GOAL #1   Title Pt will be independent with HEP in order to improve strength and decrease back pain in order to improve pain-free function at home and work. P    Baseline 09/11/20    Time 8    Period Weeks    Status New             PT Long Term Goals - 09/11/20 1552      PT LONG TERM GOAL #1   Title Pt will decrease worst back pain as reported on NPRS by at least 2 points in order to demonstrate clinically significant reduction in back pain.    Baseline 09/11/20  10/10 worst pain    Time 8    Period Weeks    Status New      PT LONG TERM GOAL #2   Title Pt will demonstrate 10MWT speed of 1.21m/s or more in order to demonstrate safe speed for community ambulation    Baseline 09/11/20 0.15m/s    Time 8    Period Weeks    Status New      PT LONG TERM GOAL #3   Title Patient will increase FOTO score to 79 to demonstrate predicted increase in functional mobility to complete ADLs    Baseline 09/11/20 30    Time 8    Period Weeks    Status New      PT  LONG TERM GOAL #4   Title Pt will demonstrate normalized stair ambulation (4) with reciprocol pattern and unilateral handrail to demonstrate safe, efficient mechanics for stair negotation at home.    Baseline 09/11/20 step to pattern for ascent and descent with RLE leading.    Time 8    Period Weeks    Status New                 Plan - 09/26/20 1432    Clinical Impression Statement PT session focused on pain reduction with some success, patient reporting 5/10 pain. PT educated patient on multiple factors to pain including stress, and sleep hygiene. PT encouraged patient to find outlets to relax, and take care of his mental health as well with suggestion of therapist given. Patient is accepting of all education provided. PT will continue therex progression as able.    Personal Factors and Comorbidities Comorbidity 1;Comorbidity 2;Comorbidity 3+;Past/Current Experience;Fitness;Time since onset of injury/illness/exacerbation;Age    Comorbidities DM, HTN, CAD, DDD, chronic LBP since 2 segment lumbar laminectomy in 1993    Examination-Activity Limitations Lift;Squat;Stairs;Stand;Locomotion Level;Reach Overhead;Carry;Sit;Transfers;Dressing;Sleep    Examination-Participation Restrictions Cleaning;Laundry;Community Activity;Meal Prep;Occupation;Yard Work    [employer] officer Evolving/Moderate complexity    Clinical Decision Making Moderate    Rehab Potential Good    PT Frequency 2x /  week    PT Duration 8 weeks    PT Treatment/Interventions ADLs/Self Care Home Management;Electrical Stimulation;Therapeutic activities;Patient/family education;Energy conservation;Spinal Manipulations;Joint Manipulations;Passive range of motion;Aquatic Therapy;Iontophoresis 4mg /ml Dexamethasone;DME Instruction;Therapeutic exercise;Vestibular;Taping;Dry needling;Manual techniques;Functional mobility training;Ultrasound;Cryotherapy;Traction;Moist Heat;Stair training;Gait training;Balance training;Neuromuscular re-education    PT Next Visit Plan 5xSTS, SLS balance, lateral step down    PT Home Exercise Plan lower lumbar rotations, DKTC, Childs pose    Consulted and Agree with Plan of Care Patient           Patient will benefit from skilled therapeutic intervention in order to improve the following deficits and impairments:  Decreased balance,Difficulty walking,Impaired flexibility,Obesity,Decreased activity tolerance,Decreased endurance,Decreased range of motion,Decreased strength,Hypomobility,Decreased coordination,Decreased mobility,Increased muscle spasms,Postural dysfunction  Visit Diagnosis: Chronic right-sided low back pain with right-sided sciatica     Problem List Patient Active Problem List   Diagnosis Date Noted  . Chronic venous insufficiency 06/29/2020  . Lymphedema 06/29/2020  . PAD (peripheral artery disease) ([place]) 05/30/2020  . Ankle ulcer, right, with fat layer exposed ([place]) 05/30/2020  . Allergy 05/29/2020  . Asthma without status asthmaticus 05/29/2020  . Diabetes mellitus type 2, uncomplicated ([place]) 97/67/3419  . S/P coronary artery stent placement 04/30/2019  . CAD (coronary artery disease) 04/20/2019  . Abnormal ECG 03/18/2019  . Chest pain with high risk for cardiac etiology 03/18/2019  . SOB (shortness of breath) on exertion 03/18/2019  . Hypercholesterolemia 05/07/2015  . 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  . Hypogonadism in male 11/22/2014  .  BPH with obstruction/lower urinary tract symptoms 11/22/2014  . DDD (degenerative disc disease), lumbar 10/13/2014  . Status post lumbar laminectomy 10/13/2014  . DJD of shoulder 10/13/2014  . Bilateral occipital neuralgia 10/13/2014  . Lumbar radiculopathy 10/13/2014  . DDD (degenerative disc disease), cervical 10/13/2014  . Depression 08/25/2013  . Essential hypertension 08/25/2013  . History of hemorrhoids 08/25/2013  . Low back pain 08/25/2013   Durwin Reges DPT Durwin Reges 09/26/2020, 3:07 PM  [facility] [address] [place], [address] Phone: [phone]   Fax:  [phone]  Name: MICKEL SCHREUR MRN: [id number] Date of Birth: [birth year]

## 2020-09-27 ENCOUNTER — Other Ambulatory Visit: Payer: Self-pay

## 2020-09-27 ENCOUNTER — Ambulatory Visit: Payer: Medicare Other

## 2020-09-27 DIAGNOSIS — M5441 Lumbago with sciatica, right side: Secondary | ICD-10-CM | POA: Diagnosis not present

## 2020-09-27 DIAGNOSIS — G8929 Other chronic pain: Secondary | ICD-10-CM

## 2020-09-27 NOTE — Therapy (Signed)
[facility] [address], [place], [address] Phone: [phone]   Fax:  [phone]  Physical Therapy Treatment  Patient Details  Name: Gerald Ellis MRN: [id number] Date of Birth: [birth year] No data recorded  Encounter Date: 09/27/2020   PT End of Session - 09/27/20 1518    Visit Number 5    Number of Visits 17    Date for PT Re-Evaluation 11/10/20    Authorization Type Medicare Part A & B    Authorization Time Period Cert 3/81-7/71    PT Start Time 1515    PT Stop Time 1555    PT Time Calculation (min) 40 min    Activity Tolerance Patient tolerated treatment well;No increased pain;Patient limited by pain    Behavior During Therapy [facility] for tasks assessed/performed           Past Medical History:  Diagnosis Date  . Benign enlargement of prostate   . Bronchitis   . Childhood asthma   . Diabetes mellitus without complication ([facility])   . Environmental allergies   . Erectile dysfunction   . Headache   . Hemorrhoids   . Hypercholesteremia   . Hypertension   . Hypogonadism in male   . Lumbago   . Over weight     Past Surgical History:  Procedure Laterality Date  . BACK SURGERY  1992  . CHOLECYSTECTOMY    . COLONOSCOPY WITH PROPOFOL N/A 08/07/2015   Procedure: COLONOSCOPY WITH PROPOFOL;  Surgeon: Christena Deem, MD;  Location: [facility] ENDOSCOPY;  Service: Endoscopy;  Laterality: N/A;  . COLONOSCOPY WITH PROPOFOL N/A 08/08/2015   Procedure: COLONOSCOPY WITH PROPOFOL;  Surgeon: Christena Deem, MD;  Location: [facility] ENDOSCOPY;  Service: Endoscopy;  Laterality: N/A;  . COLONOSCOPY WITH PROPOFOL N/A 09/16/2017   Procedure: COLONOSCOPY WITH PROPOFOL;  Surgeon: Christena Deem, MD;  Location: [facility] ENDOSCOPY;  Service: Endoscopy;  Laterality: N/A;  . CORONARY STENT INTERVENTION N/A 04/20/2019   Procedure: CORONARY STENT INTERVENTION;  Surgeon: Marcina Millard, MD;  Location: [facility] INVASIVE CV LAB;   Service: Cardiovascular;  Laterality: N/A;  . ESOPHAGOGASTRODUODENOSCOPY (EGD) WITH PROPOFOL N/A 08/07/2015   Procedure: ESOPHAGOGASTRODUODENOSCOPY (EGD) WITH PROPOFOL;  Surgeon: Christena Deem, MD;  Location: [facility] ENDOSCOPY;  Service: Endoscopy;  Laterality: N/A;  . ESOPHAGOGASTRODUODENOSCOPY (EGD) WITH PROPOFOL N/A 09/16/2017   Procedure: ESOPHAGOGASTRODUODENOSCOPY (EGD) WITH PROPOFOL;  Surgeon: Christena Deem, MD;  Location: [facility] ENDOSCOPY;  Service: Endoscopy;  Laterality: N/A;  . LEFT HEART CATH AND CORONARY ANGIOGRAPHY Left 04/20/2019   Procedure: LEFT HEART CATH AND CORONARY ANGIOGRAPHY;  Surgeon: Marcina Millard, MD;  Location: [facility] INVASIVE CV LAB;  Service: Cardiovascular;  Laterality: Left;    There were no vitals filed for this visit.   Subjective Assessment - 09/27/20 1517    Subjective Pt doing fine today, saw his endocrinologist for quarterly DM visit, he says its better. Pt denies any other medical updates. Reports his low bakc pain, stiffness is ~4/10. Pt says HEP is goign fine at home.    Pertinent History Patient is a 55 year old male presenting with chronic LBP with RLE rediculopathy since 1991. Reports back surgery (thinks this is laminectomy) in 1993.  Reports he has numbess in the posterior RLE that radiates down to his toes. Reports numbess/tingling is increased with bending forward, squatting, walking more than ; pain is decreased by changing positions. Current pain level 7/10; worst 10/10, lowest 5/10. Patient is being seen by pain management and  is on hydrocodone daily since 1991. 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 Has stairs to enter his home, has to use a handrail. No falls in past 6 months. Pt denies N/V, B&B changes, unexplained weight fluctuation, saddle paresthesia,  fever, night sweats, or unrelenting night pain at this time.    Currently in Pain? Yes    Pain Score 4     Pain Location --   across the low back             [facility] PT Assessment - 09/27/20 0001      Transfers   Five time sit to stand comments  Chair+airex pad, hands free   30.47sec (fairly pain limited, no frank LOB)     Balance   Balance Assessed Yes      Static Standing Balance   Static Standing - Balance Support No upper extremity supported   SLS: Lt (18sec); Rt (5sec)           INTERVENTION THIS DATE: -Nustep seat 9 UE 10 L4 59mins for gentle motion and strengthening *5Xsts (see above)  *SLS assessment (see above)  -hooklying Lower Trunk Rotation x20 green ball between knees to limit FADIR/maximize lumbar movement -DKTC stretch 3x30sec (foot of table elevated slightly to assist patient and facilitate lumbar neutral -SKTC stretch 2x30sec (foot of table slightly elevation)  -Thomas Test hip flexion stretch 3x30sec bilat, assisted by Pryor Curia  -SAQ over foam roller 1x15 bilat c 3lb AW        PT Short Term Goals - 09/27/20 1535      PT SHORT TERM GOAL #1   Title Pt will be independent with HEP in order to improve strength and decrease back pain in order to improve pain-free function at home and work. P    Baseline 09/11/20    Time 8    Period Weeks    Status Achieved             PT Long Term Goals - 09/27/20 1536      PT LONG TERM GOAL #1   Title Pt will decrease worst back pain as reported on NPRS by at least 2 points in order to demonstrate clinically significant reduction in back pain.    Baseline 09/11/20 10/10 worst pain    Time 8    Period Weeks    Status On-going      PT LONG TERM GOAL #2   Title Pt will demonstrate 10MWT speed of 1.42m/s or more in order to demonstrate safe speed for community ambulation    Baseline 09/11/20 0.34m/s    Time 8    Period Weeks    Status On-going      PT LONG TERM GOAL #3   Title Patient will increase FOTO score to  79 to demonstrate predicted increase in functional mobility to complete ADLs    Baseline 09/11/20: 30    Time 8    Period Weeks    Status On-going      PT LONG TERM GOAL #4   Title Pt will demonstrate normalized stair ambulation (4) with reciprocol pattern and unilateral handrail to demonstrate safe, efficient mechanics for stair negotation at home.    Baseline 09/11/20 step to pattern for ascent and descent with RLE leading.    Time 8  Period Weeks    Status On-going                 Plan - 09/27/20 1519    Clinical Impression Statement Continued with current plan of care as laid out in evaluation and recent prior sessions. Assessed 5xSTS and SLS to establish baseline. Pt remains motivated to advance progress toward goals, but also limited by pain levels at times. Rest breaks provided as needed, pt quick to ask when needed. Author maintains all interventions within appropriate level of intensity as not to purposefully exacerbate pain. Pt does require varying levels of assistance and cuing for completion of exercises for correct form and sometimes due to pain/weakness. Pt continues to demonstrate progress toward goals AEB progression of some interventions this date either in volume or intensity. No updates to HEP this date.    Personal Factors and Comorbidities Comorbidity 1;Comorbidity 2;Comorbidity 3+;Past/Current Experience;Fitness;Time since onset of injury/illness/exacerbation;Age    Comorbidities DM, HTN, CAD, DDD, chronic LBP since 2 segment lumbar laminectomy in 1993    Examination-Activity Limitations Lift;Squat;Stairs;Stand;Locomotion Level;Reach Overhead;Carry;Sit;Transfers;Dressing;Sleep    Examination-Participation Restrictions Cleaning;Laundry;Community Activity;Meal Prep;Occupation;Yard Work    [employer] officer Evolving/Moderate complexity    Clinical Decision Making Moderate    Rehab Potential Good    PT Frequency 2x / week    PT Duration 8 weeks     PT Treatment/Interventions ADLs/Self Care Home Management;Electrical Stimulation;Therapeutic activities;Patient/family education;Energy conservation;Spinal Manipulations;Joint Manipulations;Passive range of motion;Aquatic Therapy;Iontophoresis 4mg /ml Dexamethasone;DME Instruction;Therapeutic exercise;Vestibular;Taping;Dry needling;Manual techniques;Functional mobility training;Ultrasound;Cryotherapy;Traction;Moist Heat;Stair training;Gait training;Balance training;Neuromuscular re-education    PT Next Visit Plan 5xSTS, SLS balance, lateral step down    PT Home Exercise Plan lower lumbar rotations, DKTC, Childs pose    Consulted and Agree with Plan of Care Patient           Patient will benefit from skilled therapeutic intervention in order to improve the following deficits and impairments:  Decreased balance,Difficulty walking,Impaired flexibility,Obesity,Decreased activity tolerance,Decreased endurance,Decreased range of motion,Decreased strength,Hypomobility,Decreased coordination,Decreased mobility,Increased muscle spasms,Postural dysfunction  Visit Diagnosis: Chronic right-sided low back pain with right-sided sciatica     Problem List Patient Active Problem List   Diagnosis Date Noted  . Chronic venous insufficiency 06/29/2020  . Lymphedema 06/29/2020  . PAD (peripheral artery disease) ([place]) 05/30/2020  . Ankle ulcer, right, with fat layer exposed ([place]) 05/30/2020  . Allergy 05/29/2020  . Asthma without status asthmaticus 05/29/2020  . Diabetes mellitus type 2, uncomplicated ([place]) 76/28/3151  . S/P coronary artery stent placement 04/30/2019  . CAD (coronary artery disease) 04/20/2019  . Abnormal ECG 03/18/2019  . Chest pain with high risk for cardiac etiology 03/18/2019  . SOB (shortness of breath) on exertion 03/18/2019  . Hypercholesterolemia 05/07/2015  . 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  . Hypogonadism in male 11/22/2014  . BPH with obstruction/lower  urinary tract symptoms 11/22/2014  . DDD (degenerative disc disease), lumbar 10/13/2014  . Status post lumbar laminectomy 10/13/2014  . DJD of shoulder 10/13/2014  . Bilateral occipital neuralgia 10/13/2014  . Lumbar radiculopathy 10/13/2014  . DDD (degenerative disc disease), cervical 10/13/2014  . Depression 08/25/2013  . Essential hypertension 08/25/2013  . History of hemorrhoids 08/25/2013  . Low back pain 08/25/2013   3:56 PM, 09/27/20 Etta Grandchild, PT, DPT Physical Therapist - [place] [phone] (Office)    Gerald Ellis C 09/27/2020, 3:38 PM  [facility] [address], [place], [address] Phone: [phone]   Fax:  [phone]  Name: Gerald Ellis  Gerald Ellis MRN: [id number] Date of Birth: [birth year]

## 2020-10-02 ENCOUNTER — Ambulatory Visit: Payer: Medicare Other | Admitting: Physical Therapy

## 2020-10-04 ENCOUNTER — Ambulatory Visit: Payer: Medicare Other | Admitting: Physical Therapy

## 2020-10-08 ENCOUNTER — Other Ambulatory Visit: Payer: Self-pay

## 2020-10-08 ENCOUNTER — Encounter: Payer: Self-pay | Admitting: Emergency Medicine

## 2020-10-08 ENCOUNTER — Emergency Department: Payer: Medicare Other

## 2020-10-08 ENCOUNTER — Emergency Department
Admission: EM | Admit: 2020-10-08 | Discharge: 2020-10-08 | Disposition: A | Discharge status: Home / Self Care | Payer: Medicare Other | Attending: Emergency Medicine | Admitting: Emergency Medicine

## 2020-10-08 DIAGNOSIS — Z7982 Long term (current) use of aspirin: Secondary | ICD-10-CM | POA: Insufficient documentation

## 2020-10-08 DIAGNOSIS — E119 Type 2 diabetes mellitus without complications: Secondary | ICD-10-CM | POA: Insufficient documentation

## 2020-10-08 DIAGNOSIS — R0789 Other chest pain: Secondary | ICD-10-CM | POA: Insufficient documentation

## 2020-10-08 DIAGNOSIS — R202 Paresthesia of skin: Secondary | ICD-10-CM | POA: Diagnosis not present

## 2020-10-08 DIAGNOSIS — I2510 Atherosclerotic heart disease of native coronary artery without angina pectoris: Secondary | ICD-10-CM | POA: Diagnosis not present

## 2020-10-08 DIAGNOSIS — I119 Hypertensive heart disease without heart failure: Secondary | ICD-10-CM | POA: Diagnosis not present

## 2020-10-08 DIAGNOSIS — J45909 Unspecified asthma, uncomplicated: Secondary | ICD-10-CM | POA: Diagnosis not present

## 2020-10-08 DIAGNOSIS — R109 Unspecified abdominal pain: Secondary | ICD-10-CM | POA: Diagnosis not present

## 2020-10-08 DIAGNOSIS — R079 Chest pain, unspecified: Secondary | ICD-10-CM

## 2020-10-08 DIAGNOSIS — Z79899 Other long term (current) drug therapy: Secondary | ICD-10-CM | POA: Insufficient documentation

## 2020-10-08 DIAGNOSIS — Z794 Long term (current) use of insulin: Secondary | ICD-10-CM | POA: Diagnosis not present

## 2020-10-08 LAB — CBC
HCT: 40.6 % (ref 39.0–52.0)
Hemoglobin: 14.5 g/dL (ref 13.0–17.0)
MCH: 30.5 pg (ref 26.0–34.0)
MCHC: 35.7 g/dL (ref 30.0–36.0)
MCV: 85.5 fL (ref 80.0–100.0)
Platelets: 219 10*3/uL (ref 150–400)
RBC: 4.75 MIL/uL (ref 4.22–5.81)
RDW: 12.8 % (ref 11.5–15.5)
WBC: 8.8 10*3/uL (ref 4.0–10.5)
nRBC: 0 % (ref 0.0–0.2)

## 2020-10-08 LAB — BASIC METABOLIC PANEL
Anion gap: 9 (ref 5–15)
BUN: 13 mg/dL (ref 6–20)
CO2: 22 mmol/L (ref 22–32)
Calcium: 8.5 mg/dL — ABNORMAL LOW (ref 8.9–10.3)
Chloride: 104 mmol/L (ref 98–111)
Creatinine, Ser: 0.63 mg/dL (ref 0.61–1.24)
GFR, Estimated: >60 mL/min (ref >60)
Glucose, Bld: 274 mg/dL — ABNORMAL HIGH (ref 70–99)
Potassium: 3.7 mmol/L (ref 3.5–5.1)
Sodium: 135 mmol/L (ref 135–145)

## 2020-10-08 LAB — TROPONIN I (HIGH SENSITIVITY)
Troponin I (High Sensitivity): 37 ng/L — ABNORMAL HIGH (ref <18)
Troponin I (High Sensitivity): 38 ng/L — ABNORMAL HIGH (ref <18)

## 2020-10-08 MED ORDER — NITROGLYCERIN 0.4 MG SL SUBL: 0.4000 mg | SUBLINGUAL_TABLET | SUBLINGUAL | Status: DC | PRN

## 2020-10-08 MED ORDER — IOHEXOL 350 MG/ML SOLN
100.0000 mL | Freq: Once | INTRAVENOUS | Status: AC | PRN
  Administered 2020-10-08: 100 mL via INTRAVENOUS

## 2020-10-08 MED ORDER — METOPROLOL SUCCINATE ER 50 MG PO TB24
25.0000 mg | ORAL_TABLET | Freq: Every day | ORAL | Status: DC
  Administered 2020-10-08: 25 mg via ORAL
  Filled 2020-10-08: qty 1

## 2020-10-08 MED ORDER — ISOSORBIDE MONONITRATE ER 60 MG PO TB24
30.0000 mg | ORAL_TABLET | Freq: Every day | ORAL | Status: DC
  Administered 2020-10-08: 30 mg via ORAL
  Filled 2020-10-08: qty 1

## 2020-10-08 MED ORDER — MORPHINE SULFATE (PF) 4 MG/ML IV SOLN
4.0000 mg | Freq: Once | INTRAVENOUS | Status: AC
  Administered 2020-10-08: 4 mg via INTRAVENOUS
  Filled 2020-10-08: qty 1

## 2020-10-08 MED ORDER — ONDANSETRON HCL 4 MG/2ML IJ SOLN
4.0000 mg | Freq: Once | INTRAMUSCULAR | Status: AC
  Administered 2020-10-08: 4 mg via INTRAVENOUS
  Filled 2020-10-08: qty 2

## 2020-10-08 NOTE — ED Provider Notes (Signed)
[facility] Emergency Department Provider Note  ____________________________________________   Event Date/Time   First MD Initiated Contact with Patient 10/08/20 1659     (approximate)  I have reviewed the triage vital signs and the nursing notes.   HISTORY  Chief Complaint Chest Pain    HPI Gerald Ellis is a 56 y.o. male with asthma, diabetes, prior stent placement 2 years ago who comes in with chest pain is 2 hours prior to arrival.  Patient states that he is under a lot of stress recently.  His wife was recently IVC by himself for psychiatric evaluation.  She is still in the emergency room currently awaiting placement.  He stated that today was the first time he was able to get a phone call from her.  He denies feeling really stressed out during the phone call but about 5 minutes later he had sudden onset left-sided chest pain that was severe, constant, better when he held pressure over it.  Patient reports left arm tingling sensation.  Currently the pain is a 7 out of 10.  Patient is on aspirin and prasugrel.  Denies missing any medications.  Patient is hypertensive but states that he has not taken his blood pressure medicine.  Patient denies any swelling in his legs.  Reports chronic right leg sensation changes from prior accident.  No history of blood clots.    On review of records patient has a chronically occluded RCA and had stents placed 04/20/2019    Past Medical History:  Diagnosis Date  . Benign enlargement of prostate   . Bronchitis   . Childhood asthma   . Diabetes mellitus without complication ([place])   . Environmental allergies   . Erectile dysfunction   . Headache   . Hemorrhoids   . Hypercholesteremia   . Hypertension   . Hypogonadism in male   . Lumbago   . Over weight     Patient Active Problem List   Diagnosis Date Noted  . Chronic venous insufficiency 06/29/2020  . Lymphedema 06/29/2020  . PAD (peripheral artery disease)  ([place]) 05/30/2020  . Ankle ulcer, right, with fat layer exposed ([place]) 05/30/2020  . Allergy 05/29/2020  . Asthma without status asthmaticus 05/29/2020  . Diabetes mellitus type 2, uncomplicated ([place]) 53/66/4403  . S/P coronary artery stent placement 04/30/2019  . CAD (coronary artery disease) 04/20/2019  . Abnormal ECG 03/18/2019  . Chest pain with high risk for cardiac etiology 03/18/2019  . SOB (shortness of breath) on exertion 03/18/2019  . Hypercholesterolemia 05/07/2015  . 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  . Hypogonadism in male 11/22/2014  . BPH with obstruction/lower urinary tract symptoms 11/22/2014  . DDD (degenerative disc disease), lumbar 10/13/2014  . Status post lumbar laminectomy 10/13/2014  . DJD of shoulder 10/13/2014  . Bilateral occipital neuralgia 10/13/2014  . Lumbar radiculopathy 10/13/2014  . DDD (degenerative disc disease), cervical 10/13/2014  . Depression 08/25/2013  . Essential hypertension 08/25/2013  . History of hemorrhoids 08/25/2013  . Low back pain 08/25/2013    Past Surgical History:  Procedure Laterality Date  . BACK SURGERY  1992  . CHOLECYSTECTOMY    . COLONOSCOPY WITH PROPOFOL N/A 08/07/2015   Procedure: COLONOSCOPY WITH PROPOFOL;  Surgeon: Lollie Sails, MD;  Location[facility] ENDOSCOPY;  Service: Endoscopy;  Laterality: N/A;  . COLONOSCOPY WITH PROPOFOL N/A 08/08/2015   Procedure: COLONOSCOPY WITH PROPOFOL;  Surgeon: Lollie Sails, MD;  Location: [facility] ENDOSCOPY;  Service: Endoscopy;  Laterality: N/A;  .  COLONOSCOPY WITH PROPOFOL N/A 09/16/2017   Procedure: COLONOSCOPY WITH PROPOFOL;  Surgeon: Lollie Sails, MD;  Location: [facility] ENDOSCOPY;  Service: Endoscopy;  Laterality: N/A;  . CORONARY STENT INTERVENTION N/A 04/20/2019   Procedure: CORONARY STENT INTERVENTION;  Surgeon: Isaias Cowman, MD;  Location: [place] CV LAB;  Service: Cardiovascular;  Laterality: N/A;  . ESOPHAGOGASTRODUODENOSCOPY (EGD) WITH  PROPOFOL N/A 08/07/2015   Procedure: ESOPHAGOGASTRODUODENOSCOPY (EGD) WITH PROPOFOL;  Surgeon: Lollie Sails, MD;  Location: [facility] ENDOSCOPY;  Service: Endoscopy;  Laterality: N/A;  . ESOPHAGOGASTRODUODENOSCOPY (EGD) WITH PROPOFOL N/A 09/16/2017   Procedure: ESOPHAGOGASTRODUODENOSCOPY (EGD) WITH PROPOFOL;  Surgeon: Lollie Sails, MD;  Location: [facility] ENDOSCOPY;  Service: Endoscopy;  Laterality: N/A;  . LEFT HEART CATH AND CORONARY ANGIOGRAPHY Left 04/20/2019   Procedure: LEFT HEART CATH AND CORONARY ANGIOGRAPHY;  Surgeon: Isaias Cowman, MD;  Location: [place] CV LAB;  Service: Cardiovascular;  Laterality: Left;    Prior to Admission medications   Medication Sig Start Date End Date Taking? Authorizing Provider  amLODipine (NORVASC) 5 MG tablet Take 1 tablet (5 mg total) by mouth daily. 04/21/19   Clabe Seal, PA-C  aspirin 81 MG chewable tablet Chew 1 tablet (81 mg total) by mouth daily. 04/21/19   Clabe Seal, PA-C  atorvastatin (LIPITOR) 80 MG tablet Take 1 tablet (80 mg total) by mouth daily at 6 PM. 04/21/19   Clabe Seal, PA-C  busPIRone (BUSPAR) 7.5 MG tablet Take 7.5 mg by mouth 2 (two) times daily.    [provider]  cyclobenzaprine (FLEXERIL) 10 MG tablet TAKE 1/2 TO 1 (ONE-HALF TO ONE) TABLET BY MOUTH PER DAY OR TWICE DAILY IF TOLERATED. 06/08/20   [provider]  fluticasone (FLONASE) 50 MCG/ACT nasal spray Place 1 spray into both nostrils 2 (two) times daily as needed (nasal congestion).    [provider]  Fluticasone-Umeclidin-Vilant 200-62.5-25 MCG/INH AEPB Inhale into the lungs. 04/24/20   [provider]  HYDROcodone-acetaminophen (NORCO/VICODIN) 5-325 MG tablet Limit 1 tab by mouth per day or twice a day if tolerated Patient taking differently: Take 0.5-1 tablets by mouth every 4 (four) hours as needed (pain.). 01/04/16   Mohammed Kindle, MD  insulin regular human CONCENTRATED (HUMULIN R U-500 KWIKPEN) 500 UNIT/ML kwikpen Inject 30  Units into the skin 2 (two) times daily.    [provider]  Ipratropium-Albuterol (COMBIVENT) 20-100 MCG/ACT AERS respimat Inhale into the lungs. 12/15/19 12/14/20  [provider]  lisinopril (ZESTRIL) 20 MG tablet Take 1 tablet (20 mg total) by mouth daily. 04/21/19   Clabe Seal, PA-C  metFORMIN (GLUCOPHAGE) 1000 MG tablet Take 1,000 mg by mouth 2 (two) times daily.     [provider]  metoCLOPramide (REGLAN) 5 MG tablet Take 5 mg by mouth 2 (two) times daily.    [provider]  metoprolol succinate (TOPROL-XL) 50 MG 24 hr tablet Take 50 mg by mouth daily. Take with or immediately following a meal.    [provider]  montelukast (SINGULAIR) 10 MG tablet Take by mouth. 12/15/19 12/14/20  [provider]  Multiple Vitamins-Minerals (MULTIVITAMIN WITH MINERALS) tablet Take 1 tablet by mouth 2 (two) times daily.     [provider]  mupirocin ointment (BACTROBAN) 2 % SMARTSIG:1 Application Topical 2-3 Times Daily 04/06/20   [provider]  Omega-3 Fatty Acids (FISH OIL) 500 MG CAPS Take 500 mg by mouth 2 (two) times daily.    [provider]  pantoprazole (PROTONIX) 40 MG tablet Take 40 mg by  mouth 2 (two) times daily.  04/07/15   [provider]  pioglitazone (ACTOS) 15 MG tablet Take 15 mg by mouth 2 (two) times daily.    [provider]  prasugrel (EFFIENT) 10 MG TABS tablet Take 1 tablet (10 mg total) by mouth daily. 04/21/19   Clabe Seal, PA-C  silver sulfADIAZINE (SILVADENE) 1 % cream Apply 1 application topically daily. 05/30/20   Schnier, Dolores Lory, MD  tadalafil (CIALIS) 20 MG tablet Take 20 mg by mouth daily as needed for erectile dysfunction.    [provider]    Allergies Ibuprofen and Nsaids  Family History  Problem Relation Age of Onset  . Heart disease Mother        CABG  . Cancer Mother        Disseminated  . Hypertension Mother   . Hyperlipidemia Mother   . Diabetes  Father        mother  . Alzheimer's disease Other   . Kidney disease Neg Hx   . Prostate cancer Neg Hx     Social History Social History   Tobacco Use  . Smoking status: Never Smoker  . Smokeless tobacco: Never Used  Substance Use Topics  . Alcohol use: Yes    Alcohol/week: 0.0 standard drinks    Comment: occasionally  . Drug use: No      Review of Systems Constitutional: No fever/chills Eyes: No visual changes. ENT: No sore throat. Cardiovascular: Positive chest pain Respiratory: Denies shortness of breath. Gastrointestinal: No abdominal pain.  No nausea, no vomiting.  No diarrhea.  No constipation. Genitourinary: Negative for dysuria. Musculoskeletal: Negative for back pain. Skin: Negative for rash. Neurological: Negative for headaches, focal weakness or numbness.  Left arm tingling All other ROS negative ____________________________________________   PHYSICAL EXAM:  VITAL SIGNS: ED Triage Vitals  Enc Vitals Group     BP 10/08/20 1441 (!) 188/103     Pulse Rate 10/08/20 1441 90     Resp 10/08/20 1441 16     Temp 10/08/20 1441 98.4 F (36.9 C)     Temp Source 10/08/20 1441 Oral     SpO2 10/08/20 1441 96 %     Weight 10/08/20 1439 235 lb (106.6 kg)     Height 10/08/20 1439 5\' 10"  (1.778 m)     Head Circumference --      Peak Flow --      Pain Score 10/08/20 1439 9     Pain Loc --      Pain Edu? --      Excl. in [place]? --     Constitutional: Alert and oriented. Well appearing and in no acute distress. Eyes: Conjunctivae are normal. EOMI. Head: Atraumatic. Nose: No congestion/rhinnorhea. Mouth/Throat: Mucous membranes are moist.   Neck: No stridor. Trachea Midline. FROM Cardiovascular: Normal rate, regular rhythm. Grossly normal heart sounds.  Good peripheral circulation.  2+ radial, 2+ DP pulse Respiratory: Normal respiratory effort.  No retractions. Lungs CTAB. Gastrointestinal: Soft and nontender. No distention. No abdominal bruits.  Musculoskeletal:  No lower extremity tenderness nor edema.  No joint effusions. Neurologic:  Normal speech and language.  Slight tingling noted in the left arm.  Also chronic sensation changes in the right leg. Skin:  Skin is warm, dry and intact. No rash noted. Psychiatric: Mood and affect are normal. Speech and behavior are normal. GU: Deferred   ____________________________________________   LABS (all labs ordered are listed, but only abnormal results are displayed)  Labs Reviewed  BASIC  METABOLIC PANEL - Abnormal; Notable for the following components:      Result Value   Glucose, Bld 274 (*)    Calcium 8.5 (*)    All other components within normal limits  TROPONIN I (HIGH SENSITIVITY) - Abnormal; Notable for the following components:   Troponin I (High Sensitivity) 37 (*)    All other components within normal limits  CBC  TROPONIN I (HIGH SENSITIVITY)   ____________________________________________   ED ECG REPORT I, Vanessa Ramos, the attending physician, personally viewed and interpreted this ECG.  Normal sinus rhythm 88, no ST elevation, no T wave version, PVC, normal intervals ____________________________________________  RADIOLOGY Capone Bellow, personally viewed and evaluated these images (plain radiographs) as part of my medical decision making, as well as reviewing the written report by the radiologist.  ED MD interpretation: No widened mediastinum  Official radiology report(s): DG Chest 2 View  Result Date: 10/08/2020 CLINICAL DATA:  Left upper chest pain EXAM: CHEST - 2 VIEW COMPARISON:  None. FINDINGS: Heart and mediastinal contours are within normal limits. No focal opacities or effusions. No acute bony abnormality. IMPRESSION: No active cardiopulmonary disease. Electronically Signed   By: Rolm Baptise M.D.   On: 10/08/2020 15:00   CT Angio Chest/Abd/Pel for Dissection W and/or Wo Contrast  Result Date: 10/08/2020 CLINICAL DATA:  Abdominal pain. EXAM: CT ANGIOGRAPHY CHEST,  ABDOMEN AND PELVIS TECHNIQUE: Non-contrast CT of the chest was initially obtained. Multidetector CT imaging through the chest, abdomen and pelvis was performed using the standard protocol during bolus administration of intravenous contrast. Multiplanar reconstructed images and MIPs were obtained and reviewed to evaluate the vascular anatomy. CONTRAST:  180mL OMNIPAQUE IOHEXOL 350 MG/ML SOLN COMPARISON:  May 29, 2018.  May 06, 2012. FINDINGS: CTA CHEST FINDINGS Cardiovascular: Preferential opacification of the thoracic aorta. No evidence of thoracic aortic aneurysm or dissection. Normal heart size. No pericardial effusion. Coronary artery calcifications are noted. Mediastinum/Nodes: No enlarged mediastinal, hilar, or axillary lymph nodes. Thyroid gland, trachea, and esophagus demonstrate no significant findings. Lungs/Pleura: Lungs are clear. No pleural effusion or pneumothorax. Musculoskeletal: No chest wall abnormality. No acute or significant osseous findings. Review of the MIP images confirms the above findings. CTA ABDOMEN AND PELVIS FINDINGS VASCULAR Aorta: Atherosclerosis of thoracic aorta is noted without aneurysm or dissection. Celiac: Patent without evidence of aneurysm, dissection, vasculitis or significant stenosis. SMA: Patent without evidence of aneurysm, dissection, vasculitis or significant stenosis. Renals: Both renal arteries are patent without evidence of aneurysm, dissection, vasculitis, fibromuscular dysplasia or significant stenosis. IMA: Patent without evidence of aneurysm, dissection, vasculitis or significant stenosis. Inflow: Patent without evidence of aneurysm, dissection, vasculitis or significant stenosis. Veins: No obvious venous abnormality within the limitations of this arterial phase study. Review of the MIP images confirms the above findings. NON-VASCULAR Hepatobiliary: Status post cholecystectomy. No biliary dilatation is noted. Hepatic steatosis is noted. Pancreas:  Unremarkable. No pancreatic ductal dilatation or surrounding inflammatory changes. Spleen: Normal in size without focal abnormality. Adrenals/Urinary Tract: Adrenal glands are unremarkable. Kidneys are normal, without renal calculi, focal lesion, or hydronephrosis. Bladder is unremarkable. Stomach/Bowel: The stomach and appendix are unremarkable. There is no evidence of bowel obstruction or inflammation. Diverticulosis of descending and sigmoid colon is noted without inflammation. Lymphatic: No adenopathy is noted. Reproductive: Prostate is unremarkable. Other: No abdominal wall hernia or abnormality. No abdominopelvic ascites. Musculoskeletal: No acute or significant osseous findings. Review of the MIP images confirms the above findings. IMPRESSION: No evidence of thoracic or abdominal aortic dissection or  aneurysm. Coronary artery calcifications are noted. Hepatic steatosis. Diverticulosis of descending and sigmoid colon is noted without inflammation. Aortic Atherosclerosis (ICD10-I70.0). Electronically Signed   By: Marijo Conception M.D.   On: 10/08/2020 19:05    ____________________________________________   PROCEDURES  Procedure(s) performed (including Critical Care):  .1-3 Lead EKG Interpretation Performed by: Vanessa [place], MD Authorized by: Vanessa [place], MD     Interpretation: normal     ECG rate:  80s   ECG rate assessment: normal     Rhythm: sinus rhythm     Ectopy: PAC and PVCs     Conduction: normal       ____________________________________________   INITIAL IMPRESSION / ASSESSMENT AND PLAN / ED COURSE   Gerald Ellis was evaluated in Emergency Department on 10/08/2020 for the symptoms described in the history of present illness. He was evaluated in the context of the global COVID-19 pandemic, which necessitated consideration that the patient might be at risk for infection with the SARS-CoV-2 virus that causes COVID-19. Institutional protocols and algorithms that pertain  to the evaluation of patients at risk for COVID-19 are in a state of rapid change based on information released by regulatory bodies including the CDC and federal and state organizations. These policies and algorithms were followed during the patient's care in the ED.    Most Likely DDx:  -Given patient's risk factors consider ACS but also patient is very hypertensive and given that in conjunction with the left arm decreased sensation consider dissection.  Will get CT scan to further.  Patient be given some nitro and morphine to help with pain.   DDx that was also considered d/t potential to cause harm, but was found less likely based on history and physical (as detailed above): -PNA (no fevers, cough but CXR to evaluate) -PNX (reassured with equal b/l breath sounds, CXR to evaluate) -Symptomatic anemia (will get H&H) -Pulmonary embolism as no sob at rest, not pleuritic in nature, no hypoxia -Aortic Dissection as no tearing pain and no radiation to the mid back, pulses equal -Pericarditis no rub on exam, EKG changes or hx to suggest dx -Tamponade (no notable SOB, tachycardic, hypotensive) -Esophageal rupture (no h/o diffuse vomitting/no crepitus)   CT scan is negative.  Cardiac markers went from 37-38.  Reevaluated patient he states that his pain is relieved.  We discussed admission due to patient being high risk for his chest pain although his cardiac markers are elevated they are not uptrending significantly which is reassuring.  Given pain is mostly resolved patient would like to go home.  I discussed the case with Dr. Clayborn Bigness from cardiology who can see him in clinic tomorrow.  Recommended dose of Imdur 30 and increasing his metoprolol to 75 mg for tonight.  Patient states that he is not sure what dose of the metoprolol he is taking although it stated in the notes that he is taking 50 mg XL.  However had pharmacy review his medications and it does not appear that he has had any fills of this.   I discussed with patient that he needs to write down all of his medications and take them to cardiology to make sure that he is filling all of his prescriptions correctly.  He expressed understanding.  For tonight we will give him 25 mg due to not wanting to cause him low heart rates.  Patient again expresses that he feels comfortable going home.  He understands it just because his cardiac markers were not  significantly uptrending that he could still have cardiac disease and he does understand the importance that if his chest pain is returning that he needs to return to the ER immediately for repeat evaluation.  Patient understand that he should call the cardiology clinic at 8 AM to get an appointment with what ever doctor is available.       ____________________________________________   FINAL CLINICAL IMPRESSION(S) / ED DIAGNOSES   Final diagnoses:  Chest pain, unspecified type     MEDICATIONS GIVEN DURING THIS VISIT:  Medications  nitroGLYCERIN (NITROSTAT) SL tablet 0.4 mg (has no administration in time range)  isosorbide mononitrate (IMDUR) 24 hr tablet 30 mg (30 mg Oral Given 10/08/20 2030)  metoprolol succinate (TOPROL-XL) 24 hr tablet 25 mg (25 mg Oral Given 10/08/20 2030)  morphine 4 MG/ML injection 4 mg (4 mg Intravenous Given 10/08/20 1835)  ondansetron (ZOFRAN) injection 4 mg (4 mg Intravenous Given 10/08/20 1835)  iohexol (OMNIPAQUE) 350 MG/ML injection 100 mL (100 mLs Intravenous Contrast Given 10/08/20 1817)     ED Discharge Orders    None       Note:  This document was prepared using Dragon voice recognition software and may include unintentional dictation errors.   Vanessa [place], MD 10/08/20 2104

## 2020-10-08 NOTE — ED Notes (Signed)
Patient transported to CT 

## 2020-10-08 NOTE — ED Triage Notes (Signed)
Pt to ED via POV stating that he started having chest pain about 2 hours PTA. Pt states that he had stents placed 2 years ago. Pt states that the pain is located in the left chest/shoulder. Pt reports some difficulty breathing. Pt is in NAD.

## 2020-10-08 NOTE — Discharge Instructions (Addendum)
We discussed admission versus going home for your chest pain.  You have opted to want to go home.  You should write a list of all of your medications that you are taking to the cardiologist.  I want to make sure you are taking your metoprolol.  You should call their office tomorrow morning at 8 AM and state that you need an ER follow-up recommended by Dr. Clayborn Bigness.  You should get a appointment with any doctor that is available for TOMORROW.  If you have increasing chest pain prior to then or unable to get appropriate follow-up he can return to the ER for repeat evaluation

## 2020-10-09 ENCOUNTER — Ambulatory Visit: Payer: Medicare Other | Admitting: Physical Therapy

## 2020-10-11 ENCOUNTER — Ambulatory Visit: Payer: Medicare Other | Admitting: Physical Therapy

## 2020-10-17 ENCOUNTER — Ambulatory Visit: Payer: Medicare Other | Admitting: Physical Therapy

## 2020-10-18 ENCOUNTER — Encounter: Payer: Medicare Other | Admitting: Physical Therapy

## 2020-10-19 ENCOUNTER — Ambulatory Visit: Payer: Medicare Other | Admitting: Physical Therapy

## 2020-11-06 ENCOUNTER — Ambulatory Visit (INDEPENDENT_AMBULATORY_CARE_PROVIDER_SITE_OTHER): Payer: Medicare Other | Admitting: Vascular Surgery

## 2020-11-06 ENCOUNTER — Encounter (INDEPENDENT_AMBULATORY_CARE_PROVIDER_SITE_OTHER): Payer: Self-pay | Admitting: Vascular Surgery

## 2020-12-16 ENCOUNTER — Emergency Department
Admission: EM | Admit: 2020-12-16 | Discharge: 2020-12-16 | Disposition: A | Discharge status: Home / Self Care | Payer: Medicare Other | Attending: Emergency Medicine | Admitting: Emergency Medicine

## 2020-12-16 ENCOUNTER — Other Ambulatory Visit: Payer: Self-pay

## 2020-12-16 DIAGNOSIS — J029 Acute pharyngitis, unspecified: Secondary | ICD-10-CM | POA: Diagnosis not present

## 2020-12-16 DIAGNOSIS — Z20822 Contact with and (suspected) exposure to covid-19: Secondary | ICD-10-CM | POA: Insufficient documentation

## 2020-12-16 DIAGNOSIS — R519 Headache, unspecified: Secondary | ICD-10-CM | POA: Insufficient documentation

## 2020-12-16 DIAGNOSIS — Z7982 Long term (current) use of aspirin: Secondary | ICD-10-CM | POA: Diagnosis not present

## 2020-12-16 DIAGNOSIS — Z794 Long term (current) use of insulin: Secondary | ICD-10-CM | POA: Diagnosis not present

## 2020-12-16 DIAGNOSIS — I10 Essential (primary) hypertension: Secondary | ICD-10-CM | POA: Insufficient documentation

## 2020-12-16 DIAGNOSIS — Z79899 Other long term (current) drug therapy: Secondary | ICD-10-CM | POA: Diagnosis not present

## 2020-12-16 DIAGNOSIS — R059 Cough, unspecified: Secondary | ICD-10-CM | POA: Insufficient documentation

## 2020-12-16 DIAGNOSIS — Z7984 Long term (current) use of oral hypoglycemic drugs: Secondary | ICD-10-CM | POA: Diagnosis not present

## 2020-12-16 DIAGNOSIS — E119 Type 2 diabetes mellitus without complications: Secondary | ICD-10-CM | POA: Insufficient documentation

## 2020-12-16 DIAGNOSIS — I2510 Atherosclerotic heart disease of native coronary artery without angina pectoris: Secondary | ICD-10-CM | POA: Insufficient documentation

## 2020-12-16 LAB — GROUP A STREP BY PCR: Group A Strep by PCR: NOT DETECTED

## 2020-12-16 MED ORDER — DEXAMETHASONE SODIUM PHOSPHATE 10 MG/ML IJ SOLN
10.0000 mg | Freq: Once | INTRAMUSCULAR | Status: AC
  Administered 2020-12-16: 10 mg via INTRAMUSCULAR
  Filled 2020-12-16: qty 1

## 2020-12-16 NOTE — ED Provider Notes (Signed)
[facility] Emergency Department Provider Note ____________________________________________  Time seen: 2300  I have reviewed the triage vital signs and the nursing notes.  HISTORY  Chief Complaint  Sore Throat   HPI Gerald Ellis is a 55 y.o. male presents to the ER today with complaint of headache, sore throat and cough.  He reports this started earlier today.  The headache is located all over his head.  He describes the pain as pressure.  He denies dizziness or visual changes.  He is having some difficulty swallowing.  The cough is dry nonproductive.  He denies runny nose, nasal congestion, ear pain, shortness of breath, chest pain, nausea, vomiting or diarrhea.  He denies fever, chills or body aches.  He has a history of childhood asthma but reports he grew out of this.  He does take medication for allergies but does not remember the name.  He does not smoke.  He has not had exposure to COVID that he is aware of but he has had sick contacts with similar symptoms.  He has not taken anything OTC for symptoms.  Past Medical History:  Diagnosis Date   Benign enlargement of prostate    Bronchitis    Childhood asthma    Diabetes mellitus without complication ([place])    Environmental allergies    Erectile dysfunction    Headache    Hemorrhoids    Hypercholesteremia    Hypertension    Hypogonadism in male    Lumbago    Over weight     Patient Active Problem List   Diagnosis Date Noted   Chronic venous insufficiency 06/29/2020   Lymphedema 06/29/2020   PAD (peripheral artery disease) ([place]) 05/30/2020   Ankle ulcer, right, with fat layer exposed ([place]) 05/30/2020   Allergy 05/29/2020   Asthma without status asthmaticus 05/29/2020   Diabetes mellitus type 2, uncomplicated ([place]) [id number]   S/P coronary artery stent placement 04/30/2019   CAD (coronary artery disease) 04/20/2019   Abnormal ECG 03/18/2019   Chest pain with high risk for cardiac etiology  03/18/2019   SOB (shortness of breath) on exertion 03/18/2019   Hypercholesterolemia 05/07/2015   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   Hypogonadism in male 11/22/2014   BPH with obstruction/lower urinary tract symptoms 11/22/2014   DDD (degenerative disc disease), lumbar 10/13/2014   Status post lumbar laminectomy 10/13/2014   DJD of shoulder 10/13/2014   Bilateral occipital neuralgia 10/13/2014   Lumbar radiculopathy 10/13/2014   DDD (degenerative disc disease), cervical 10/13/2014   Depression 08/25/2013   Essential hypertension 08/25/2013   History of hemorrhoids 08/25/2013   Low back pain 08/25/2013    Past Surgical History:  Procedure Laterality Date   BACK SURGERY  1992   CHOLECYSTECTOMY     COLONOSCOPY WITH PROPOFOL N/A 08/07/2015   Procedure: COLONOSCOPY WITH PROPOFOL;  Surgeon: Lollie Sails, MD;  Location: [facility] ENDOSCOPY;  Service: Endoscopy;  Laterality: N/A;   COLONOSCOPY WITH PROPOFOL N/A 08/08/2015   Procedure: COLONOSCOPY WITH PROPOFOL;  Surgeon: Lollie Sails, MD;  Location: [facility] ENDOSCOPY;  Service: Endoscopy;  Laterality: N/A;   COLONOSCOPY WITH PROPOFOL N/A 09/16/2017   Procedure: COLONOSCOPY WITH PROPOFOL;  Surgeon: Lollie Sails, MD;  Location: [facility] ENDOSCOPY;  Service: Endoscopy;  Laterality: N/A;   CORONARY STENT INTERVENTION N/A 04/20/2019   Procedure: CORONARY STENT INTERVENTION;  Surgeon: Isaias Cowman, MD;  Location: [facility] CV LAB;  Service: Cardiovascular;  Laterality: N/A;   ESOPHAGOGASTRODUODENOSCOPY (EGD) WITH PROPOFOL N/A 08/07/2015  Procedure: ESOPHAGOGASTRODUODENOSCOPY (EGD) WITH PROPOFOL;  Surgeon: Lollie Sails, MD;  Location[facility] ENDOSCOPY;  Service: Endoscopy;  Laterality: N/A;   ESOPHAGOGASTRODUODENOSCOPY (EGD) WITH PROPOFOL N/A 09/16/2017   Procedure: ESOPHAGOGASTRODUODENOSCOPY (EGD) WITH PROPOFOL;  Surgeon: Lollie Sails, MD;  Location: [facility] ENDOSCOPY;  Service: Endoscopy;  Laterality: N/A;    LEFT HEART CATH AND CORONARY ANGIOGRAPHY Left 04/20/2019   Procedure: LEFT HEART CATH AND CORONARY ANGIOGRAPHY;  Surgeon: Isaias Cowman, MD;  Location: [place] CV LAB;  Service: Cardiovascular;  Laterality: Left;    Prior to Admission medications   Medication Sig Start Date End Date Taking? Authorizing Provider  amLODipine (NORVASC) 5 MG tablet Take 1 tablet (5 mg total) by mouth daily. 04/21/19   Clabe Seal, PA-C  aspirin 81 MG chewable tablet Chew 1 tablet (81 mg total) by mouth daily. 04/21/19   Clabe Seal, PA-C  atorvastatin (LIPITOR) 80 MG tablet Take 1 tablet (80 mg total) by mouth daily at 6 PM. 04/21/19   Clabe Seal, PA-C  busPIRone (BUSPAR) 7.5 MG tablet Take 7.5 mg by mouth 2 (two) times daily.    [provider]  cyclobenzaprine (FLEXERIL) 10 MG tablet TAKE 1/2 TO 1 (ONE-HALF TO ONE) TABLET BY MOUTH PER DAY OR TWICE DAILY IF TOLERATED. 06/08/20   [provider]  fluticasone (FLONASE) 50 MCG/ACT nasal spray Place 1 spray into both nostrils 2 (two) times daily as needed (nasal congestion).    [provider]  Fluticasone-Umeclidin-Vilant 200-62.5-25 MCG/INH AEPB Inhale into the lungs. 04/24/20   [provider]  HYDROcodone-acetaminophen (NORCO/VICODIN) 5-325 MG tablet Limit 1 tab by mouth per day or twice a day if tolerated Patient taking differently: Take 0.5-1 tablets by mouth every 4 (four) hours as needed (pain.). 01/04/16   Mohammed Kindle, MD  insulin regular human CONCENTRATED (HUMULIN R U-500 KWIKPEN) 500 UNIT/ML kwikpen Inject 30 Units into the skin 2 (two) times daily.    [provider]  Ipratropium-Albuterol (COMBIVENT) 20-100 MCG/ACT AERS respimat Inhale into the lungs. 12/15/19 12/14/20  [provider]  lisinopril (ZESTRIL) 20 MG tablet Take 1 tablet (20 mg total) by mouth daily. 04/21/19   Clabe Seal, PA-C  metFORMIN (GLUCOPHAGE) 1000 MG tablet Take 1,000 mg by mouth 2 (two) times daily.     [provider]  metoCLOPramide (REGLAN) 5 MG tablet Take 5 mg by mouth 2 (two) times daily.    [provider]  metoprolol succinate (TOPROL-XL) 50 MG 24 hr tablet Take 50 mg by mouth daily. Take with or immediately following a meal.    [provider]  montelukast (SINGULAIR) 10 MG tablet Take by mouth. 12/15/19 12/14/20  [provider]  Multiple Vitamins-Minerals (MULTIVITAMIN WITH MINERALS) tablet Take 1 tablet by mouth 2 (two) times daily.     [provider]  mupirocin ointment (BACTROBAN) 2 % SMARTSIG:1 Application Topical 2-3 Times Daily 04/06/20   [provider]  Omega-3 Fatty Acids (FISH OIL) 500 MG CAPS Take 500 mg by mouth 2 (two) times daily.    [provider]  pantoprazole (PROTONIX) 40 MG tablet Take 40 mg by mouth 2 (two) times daily.  04/07/15   [provider]  pioglitazone (ACTOS) 15 MG tablet Take 15 mg by mouth 2 (two) times daily.    [provider]  prasugrel (EFFIENT) 10 MG TABS tablet Take 1 tablet (10 mg total) by mouth daily. 04/21/19   Clabe Seal, PA-C  silver sulfADIAZINE (SILVADENE) 1 % cream Apply 1 application topically daily.  05/30/20   Schnier, Dolores Lory, MD  tadalafil (CIALIS) 20 MG tablet Take 20 mg by mouth daily as needed for erectile dysfunction.    [provider]    Allergies Ibuprofen and Nsaids  Family History  Problem Relation Age of Onset   Heart disease Mother        CABG   Cancer Mother        Disseminated   Hypertension Mother    Hyperlipidemia Mother    Diabetes Father        mother   Alzheimer's disease Other    Kidney disease Neg Hx    Prostate cancer Neg Hx     Social History Social History   Tobacco Use   Smoking status: Never   Smokeless tobacco: Never  Substance Use Topics   Alcohol use: Yes    Alcohol/week: 0.0 standard drinks    Comment: occasionally   Drug use: No    Review of Systems  Constitutional: Negative for fever, chills or body  aches. Eyes: Negative for visual changes, eye pain, eye redness or discharge. ENT: Positive for sore throat.  Negative for runny nose, nasal congestion or ear pain Cardiovascular: Negative for chest pain or chest tightness. Respiratory: Positive for cough.  Negative for shortness of breath. Gastrointestinal: Negative for nausea, vomiting and diarrhea. Skin: Negative for rash. Neurological: Positive for headache.  Negative for dizziness focal weakness, tingling or numbness. ____________________________________________  PHYSICAL EXAM:  VITAL SIGNS: ED Triage Vitals  Enc Vitals Group     BP 12/16/20 2151 (!) 165/104     Pulse Rate 12/16/20 2151 99     Resp 12/16/20 2151 18     Temp 12/16/20 2151 98.6 F (37 C)     Temp Source 12/16/20 2151 Oral     SpO2 12/16/20 2151 98 %     Weight 12/16/20 2152 235 lb (106.6 kg)     Height 12/16/20 2152 '5\' 10"'$  (1.778 m)     Head Circumference --      Peak Flow --      Pain Score 12/16/20 2155 4     Pain Loc --      Pain Edu? --      Excl. in [place]? --     Constitutional: Alert and oriented.  Obese, in no distress. Head: Normocephalic. Eyes: Sclera white.  Conjunctivae are normal. Normal extraocular movements Mouth/Throat: Mucous membranes are moist.  No posterior pharynx erythema or exudate noted.  + PND. Hematological/Lymphatic/Immunological: No cervical lymphadenopathy. Cardiovascular: Normal rate, regular rhythm.  Respiratory: Normal respiratory effort. No wheezes/rales/rhonchi. Neurologic:   Normal speech and language. No gross focal neurologic deficits are appreciated. Skin:  Skin is warm, dry and intact. No rash noted.  ____________________________________________    LABS Labs Reviewed  GROUP A STREP BY PCR  RESP PANEL BY RT-PCR (FLU A&B, COVID) [id number]      INITIAL IMPRESSION / ASSESSMENT AND PLAN / ED COURSE  Acute Headache, Sore Throat and Cough:  DDx include allergic rhinitis, viral sinusitis, viral uri with cough, viral  pharyngitis, bacterial pharyngitis, covid, influenza Strep test negative Flu, Covid swab pending- he does not want to wait for results, he understands he will be called if he has a positive result Decadron 10 mg IM x 1- advised him to monitor for elevation of blood sugars for the next 72 hours Continue allergy meds He will follow up with his PCP if symptoms persist ____________________________________________  FINAL CLINICAL IMPRESSION(S) / ED DIAGNOSES  Final diagnoses:  Acute intractable headache, unspecified headache type  Sore throat  Cough      Jearld Fenton, NP 12/16/20 2350    Lucrezia Starch, MD 12/17/20 [phone]

## 2020-12-16 NOTE — Discharge Instructions (Addendum)
You were seen today for acute headache, sore throat and cough. Your strep test is negative. Your covid test is pending. You received a steroid injection for symptom management, monitor your sugars closely over the next 72 hours. Continue allergy meds. You will be called if you have a positive covid test. Follow up with your PCP if symptoms persist.

## 2020-12-16 NOTE — ED Triage Notes (Addendum)
Pt reports sore throat onset 3-4 hours PTA. Denies fever but reports HA and dry cough, denies recent covid exposure. Denies any other symptoms or pain.

## 2020-12-17 LAB — RESP PANEL BY RT-PCR (FLU A&B, COVID) ARPGX2
Influenza A by PCR: NEGATIVE
Influenza B by PCR: NEGATIVE
SARS Coronavirus 2 by RT PCR: NEGATIVE

## 2020-12-19 ENCOUNTER — Encounter: Payer: Self-pay | Admitting: General Surgery

## 2020-12-20 ENCOUNTER — Ambulatory Visit
Admission: RE | Admit: 2020-12-20 | Discharge: 2020-12-20 | Disposition: A | Discharge status: Home / Self Care | Payer: Medicare Other | Attending: General Surgery | Admitting: General Surgery

## 2020-12-20 ENCOUNTER — Encounter
Admission: RE | Disposition: A | Discharge status: Home / Self Care | Payer: Self-pay | Source: Home / Self Care | Attending: General Surgery

## 2020-12-20 ENCOUNTER — Ambulatory Visit: Payer: Medicare Other | Admitting: Anesthesiology

## 2020-12-20 DIAGNOSIS — R6881 Early satiety: Secondary | ICD-10-CM | POA: Diagnosis not present

## 2020-12-20 DIAGNOSIS — R1110 Vomiting, unspecified: Secondary | ICD-10-CM | POA: Insufficient documentation

## 2020-12-20 DIAGNOSIS — K5730 Diverticulosis of large intestine without perforation or abscess without bleeding: Secondary | ICD-10-CM | POA: Insufficient documentation

## 2020-12-20 DIAGNOSIS — R109 Unspecified abdominal pain: Secondary | ICD-10-CM | POA: Insufficient documentation

## 2020-12-20 DIAGNOSIS — Z79899 Other long term (current) drug therapy: Secondary | ICD-10-CM | POA: Insufficient documentation

## 2020-12-20 DIAGNOSIS — D123 Benign neoplasm of transverse colon: Secondary | ICD-10-CM | POA: Insufficient documentation

## 2020-12-20 DIAGNOSIS — K635 Polyp of colon: Secondary | ICD-10-CM | POA: Diagnosis not present

## 2020-12-20 HISTORY — PX: ESOPHAGOGASTRODUODENOSCOPY (EGD) WITH PROPOFOL: SHX5813

## 2020-12-20 HISTORY — PX: COLONOSCOPY WITH PROPOFOL: SHX5780

## 2020-12-20 LAB — GLUCOSE, CAPILLARY: Glucose-Capillary: 371 mg/dL — ABNORMAL HIGH (ref 70–99)

## 2020-12-20 SURGERY — COLONOSCOPY WITH PROPOFOL
Anesthesia: General

## 2020-12-20 MED ORDER — FENTANYL CITRATE (PF) 100 MCG/2ML IJ SOLN
INTRAMUSCULAR | Status: AC
  Filled 2020-12-20: qty 2

## 2020-12-20 MED ORDER — FENTANYL CITRATE (PF) 100 MCG/2ML IJ SOLN
INTRAMUSCULAR | Status: DC | PRN
  Administered 2020-12-20: 50 ug via INTRAVENOUS

## 2020-12-20 MED ORDER — PROPOFOL 10 MG/ML IV BOLUS
INTRAVENOUS | Status: DC | PRN
  Administered 2020-12-20: 70 mg via INTRAVENOUS

## 2020-12-20 MED ORDER — PROPOFOL 500 MG/50ML IV EMUL
INTRAVENOUS | Status: DC | PRN
  Administered 2020-12-20: 125 ug/kg/min via INTRAVENOUS

## 2020-12-20 MED ORDER — LIDOCAINE HCL (CARDIAC) PF 100 MG/5ML IV SOSY
PREFILLED_SYRINGE | INTRAVENOUS | Status: DC | PRN
  Administered 2020-12-20: 100 mg via INTRAVENOUS

## 2020-12-20 MED ORDER — SODIUM CHLORIDE 0.9 % IV SOLN
INTRAVENOUS | Status: DC
  Administered 2020-12-20: 20 mL/h via INTRAVENOUS

## 2020-12-20 MED ORDER — PHENYLEPHRINE HCL (PRESSORS) 10 MG/ML IV SOLN
INTRAVENOUS | Status: DC | PRN
  Administered 2020-12-20: 100 ug via INTRAVENOUS
  Administered 2020-12-20 (×2): 50 ug via INTRAVENOUS

## 2020-12-20 NOTE — Op Note (Signed)
[facility] Gastroenterology Patient Name: Gerald Ellis Procedure Date: 12/20/2020 7:12 AM MRN: [id number] Account #: [id number] Date of Birth: Aug 17, 1965 Admit Type: Outpatient Age: 55 Room: [facility] ENDO ROOM 1 Gender: Male Note Status: Finalized Procedure:             Upper GI endoscopy Indications:           Vomiting Providers:             Arren Bellow, MD Referring MD:          Irven Easterly. Kary Kos, MD (Referring MD) Medicines:             Propofol per Anesthesia Complications:         No immediate complications. Procedure:             Pre-Anesthesia Assessment:                        - Prior to the procedure, a History and Physical was                         performed, and patient medications, allergies and                         sensitivities were reviewed. The patient's tolerance                         of previous anesthesia was reviewed.                        - The risks and benefits of the procedure and the                         sedation options and risks were discussed with the                         patient. All questions were answered and informed                         consent was obtained.                        After obtaining informed consent, the endoscope was                         passed under direct vision. Throughout the procedure,                         the patient's blood pressure, pulse, and oxygen                         saturations were monitored continuously. The Endoscope                         was introduced through the mouth, and advanced to the                         third part of duodenum. The upper GI endoscopy was  accomplished without difficulty. The patient tolerated                         the procedure well. Findings:      The esophagus was normal.      The stomach was normal.      The examined duodenum was normal. Impression:            - Normal esophagus.                        -  Normal stomach.                        - Normal examined duodenum.                        - No specimens collected. Recommendation:        - Perform a colonoscopy today. Procedure Code(s):     --- Professional ---                        [phone], Esophagogastroduodenoscopy, flexible,                         transoral; diagnostic, including collection of                         specimen(s) by brushing or washing, when performed                         (separate procedure) Diagnosis Code(s):     --- Professional ---                        R11.10, Vomiting, unspecified CPT copyright 2019 American Medical Association. All rights reserved. The codes documented in this report are preliminary and upon coder review may  be revised to meet current compliance requirements. Keywon Bellow, MD 12/20/2020 8:07:11 AM This report has been signed electronically. Number of Addenda: 0 Note Initiated On: 12/20/2020 7:12 AM Estimated Blood Loss:  Estimated blood loss: none.      [facility]

## 2020-12-20 NOTE — H&P (Addendum)
Gerald KONCZAL [id number] 1965/12/04     HPI:  55 y/o male with history of early satiety, abdominal pain and postprandial vomiting.  History of polyps in the past.   Had to re-prep for a past exam.   Medications Prior to Admission  Medication Sig Dispense Refill Last Dose   amLODipine (NORVASC) 5 MG tablet Take 1 tablet (5 mg total) by mouth daily. 30 tablet 11 Past Week   aspirin 81 MG chewable tablet Chew 1 tablet (81 mg total) by mouth daily. 30 tablet 11 Past Week   atorvastatin (LIPITOR) 80 MG tablet Take 1 tablet (80 mg total) by mouth daily at 6 PM. 30 tablet 11 Past Week   busPIRone (BUSPAR) 7.5 MG tablet Take 7.5 mg by mouth 2 (two) times daily.   Past Week   cetirizine (ZYRTEC) 10 MG tablet Take 10 mg by mouth daily.   Past Week   cyclobenzaprine (FLEXERIL) 10 MG tablet TAKE 1/2 TO 1 (ONE-HALF TO ONE) TABLET BY MOUTH PER DAY OR TWICE DAILY IF TOLERATED.   Past Week   diclofenac Sodium (VOLTAREN) 1 % GEL Apply 2 g topically 4 (four) times daily.      fluticasone (FLONASE) 50 MCG/ACT nasal spray Place 1 spray into both nostrils 2 (two) times daily as needed (nasal congestion).   Past Week   Fluticasone-Umeclidin-Vilant 200-62.5-25 MCG/INH AEPB Inhale into the lungs.   Past Week   HYDROcodone-acetaminophen (NORCO/VICODIN) 5-325 MG tablet Limit 1 tab by mouth per day or twice a day if tolerated (Patient taking differently: Take 0.5-1 tablets by mouth every 4 (four) hours as needed (pain.).) 50 tablet 0 Past Week   insulin regular human CONCENTRATED (HUMULIN R U-500 KWIKPEN) 500 UNIT/ML kwikpen Inject 30 Units into the skin 2 (two) times daily.   Past Week   isosorbide mononitrate (IMDUR) 30 MG 24 hr tablet Take 30 mg by mouth daily.   Past Week   lisinopril (ZESTRIL) 20 MG tablet Take 1 tablet (20 mg total) by mouth daily. 30 tablet 11 Past Week   metFORMIN (GLUCOPHAGE) 1000 MG tablet Take 1,000 mg by mouth 2 (two) times daily.    Past Week   metoprolol succinate (TOPROL-XL) 50 MG 24  hr tablet Take 50 mg by mouth daily. Take with or immediately following a meal.   Past Week   Multiple Vitamins-Minerals (MULTIVITAMIN WITH MINERALS) tablet Take 1 tablet by mouth 2 (two) times daily.    Past Week   mupirocin ointment (BACTROBAN) 2 % SMARTSIG:1 Application Topical 2-3 Times Daily   Past Week   Omega-3 Fatty Acids (FISH OIL) 500 MG CAPS Take 500 mg by mouth 2 (two) times daily.   Past Week   pantoprazole (PROTONIX) 40 MG tablet Take 40 mg by mouth 2 (two) times daily.    Past Week   pioglitazone (ACTOS) 15 MG tablet Take 15 mg by mouth 2 (two) times daily.   Past Week   prasugrel (EFFIENT) 10 MG TABS tablet Take 1 tablet (10 mg total) by mouth daily. 30 tablet  Past Week   silver sulfADIAZINE (SILVADENE) 1 % cream Apply 1 application topically daily. 50 g 2 Past Week   tadalafil (CIALIS) 20 MG tablet Take 20 mg by mouth daily as needed for erectile dysfunction.   Past Week   Ipratropium-Albuterol (COMBIVENT) 20-100 MCG/ACT AERS respimat Inhale into the lungs.      metoCLOPramide (REGLAN) 5 MG tablet Take 5 mg by mouth 2 (two) times daily.  montelukast (SINGULAIR) 10 MG tablet Take by mouth.      Allergies  Allergen Reactions   Ibuprofen Shortness Of Breath   Nsaids Swelling    Swelling of throat   Past Medical History:  Diagnosis Date   Benign enlargement of prostate    Bronchitis    Childhood asthma    Diabetes mellitus without complication ([place])    Environmental allergies    Erectile dysfunction    Headache    Hemorrhoids    Hypercholesteremia    Hypertension    Hypogonadism in male    Lumbago    Over weight    Past Surgical History:  Procedure Laterality Date   BACK SURGERY  1992   CHOLECYSTECTOMY     COLONOSCOPY WITH PROPOFOL N/A 08/07/2015   Procedure: COLONOSCOPY WITH PROPOFOL;  Surgeon: Lollie Sails, MD;  Location: [facility] ENDOSCOPY;  Service: Endoscopy;  Laterality: N/A;   COLONOSCOPY WITH PROPOFOL N/A 08/08/2015   Procedure: COLONOSCOPY WITH  PROPOFOL;  Surgeon: Lollie Sails, MD;  Location: [facility] ENDOSCOPY;  Service: Endoscopy;  Laterality: N/A;   COLONOSCOPY WITH PROPOFOL N/A 09/16/2017   Procedure: COLONOSCOPY WITH PROPOFOL;  Surgeon: Lollie Sails, MD;  Location: [facility] ENDOSCOPY;  Service: Endoscopy;  Laterality: N/A;   CORONARY STENT INTERVENTION N/A 04/20/2019   Procedure: CORONARY STENT INTERVENTION;  Surgeon: Isaias Cowman, MD;  Location: [place] CV LAB;  Service: Cardiovascular;  Laterality: N/A;   ESOPHAGOGASTRODUODENOSCOPY (EGD) WITH PROPOFOL N/A 08/07/2015   Procedure: ESOPHAGOGASTRODUODENOSCOPY (EGD) WITH PROPOFOL;  Surgeon: Lollie Sails, MD;  Location: [facility] ENDOSCOPY;  Service: Endoscopy;  Laterality: N/A;   ESOPHAGOGASTRODUODENOSCOPY (EGD) WITH PROPOFOL N/A 09/16/2017   Procedure: ESOPHAGOGASTRODUODENOSCOPY (EGD) WITH PROPOFOL;  Surgeon: Lollie Sails, MD;  Location: [facility] ENDOSCOPY;  Service: Endoscopy;  Laterality: N/A;   LEFT HEART CATH AND CORONARY ANGIOGRAPHY Left 04/20/2019   Procedure: LEFT HEART CATH AND CORONARY ANGIOGRAPHY;  Surgeon: Isaias Cowman, MD;  Location: [place] CV LAB;  Service: Cardiovascular;  Laterality: Left;   Social History   Socioeconomic History   Marital status: Married    Spouse name: Not on file   Number of children: Not on file   Years of education: Not on file   Highest education level: Not on file  Occupational History   Not on file  Tobacco Use   Smoking status: Never   Smokeless tobacco: Never  Substance and Sexual Activity   Alcohol use: Yes    Alcohol/week: 0.0 standard drinks    Comment: occasionally   Drug use: No   Sexual activity: Not Currently  Other Topics Concern   Not on file  Social History Narrative   Not on file   Social Determinants of Health   Financial Resource Strain: Not on file  Food Insecurity: Not on file  Transportation Needs: Not on file  Physical Activity: Not on file  Stress: Not on file  Social  Connections: Not on file  Intimate Partner Violence: Not on file   Social History   Social History Narrative   Not on file     ROS: Negative.  2019 exam:  DIAGNOSIS:  A.  DUODENUM; COLD BIOPSY:  - DUODENAL MUCOSA WITH INTACT VILLI.  - NEGATIVE FOR ACTIVE INFLAMMATION, INTRAEPITHELIAL LYMPHOCYTOSIS, AND  INFECTIOUS AGENTS.   B.  STOMACH, ANTRUM AND BODY; COLD BIOPSY:  - ANTRAL MUCOSA WITH MILD REACTIVE GASTROPATHY.  - OXYNTIC MUCOSA WITHOUT PATHOLOGIC CHANGES.  - NEGATIVE FOR H. PYLORI, INTESTINAL METAPLASIA, ATROPHY, DYSPLASIA, AND  MALIGNANCY.  C.  GASTROESOPHAGEAL JUNCTION; COLD BIOPSY:  - SQUAMOCOLUMNAR MUCOSA WITH MILD CHRONIC INFLAMMATION.  - NEGATIVE FOR GOBLET CELLS, DYSPLASIA, AND MALIGNANCY.   D.  COLON POLYP, DESCENDING; COLD SNARE:  - TUBULAR ADENOMA.  - NEGATIVE FOR HIGH-GRADE DYSPLASIA AND MALIGNANCY.   E.  COLON POLYP X 2, TRANSVERSE; COLD SNARE:  - TUBULAR ADENOMAS, 6 FRAGMENTS.  - NEGATIVE FOR HIGH-GRADE DYSPLASIA AND MALIGNANCY.   F.  COLON POLYP X 2, PROXIMAL TRANSVERSE; COLD SNARE:  - TUBULAR ADENOMAS, 4 FRAGMENTS.  - NEGATIVE FOR HIGH-GRADE DYSPLASIA AND MALIGNANCY.  2017 exam:  DIAGNOSIS:  A. COLON POLYP, DISTAL TRANSVERSE, HOT SNARE:  - TUBULAR ADENOMA.  - NEGATIVE FOR HIGH GRADE DYSPLASIA AND MALIGNANCY.   B. COLON POLYP 2, PROXIMAL ASCENDING; HOT SNARE:  - TUBULAR ADENOMA (FRAGMENTS).  - NEGATIVE FOR HIGH GRADE DYSPLASIA AND MALIGNANCY.   C. COLON POLYP, DISTAL ASCENDING; COLD SNARE:  - TUBULAR ADENOMA.  - NEGATIVE FOR HIGH GRADE DYSPLASIA AND MALIGNANCY.   D. COLON POLYP, PROXIMAL SIGMOID; HOT SNARE:  - TUBULAR ADENOMA.  - NEGATIVE FOR HIGH GRADE DYSPLASIA AND MALIGNANCY.   E. COLON POLYP, MID SIGMOID; HOT SNARE:  - TUBULAR ADENOMA.  - NEGATIVE FOR HIGH GRADE DYSPLASIA AND MALIGNANCY  PE: HEENT: Negative. Lungs: Clear. Cardio: RRForest Gleason Ladell Bey 12/20/2020   Assessment/Plan:  Proceed with planned endoscopy.

## 2020-12-20 NOTE — Anesthesia Postprocedure Evaluation (Signed)
Anesthesia Post Note  Patient: Gerald Ellis  Procedure(s) Performed: COLONOSCOPY WITH PROPOFOL ESOPHAGOGASTRODUODENOSCOPY (EGD) WITH PROPOFOL  Patient location during evaluation: PACU Anesthesia Type: General Level of consciousness: awake and alert Pain management: pain level controlled Vital Signs Assessment: post-procedure vital signs reviewed and stable Respiratory status: spontaneous breathing, nonlabored ventilation, respiratory function stable and patient connected to nasal cannula oxygen Cardiovascular status: blood pressure returned to baseline and stable Postop Assessment: no apparent nausea or vomiting Anesthetic complications: no   No notable events documented.   Last Vitals:  Vitals:   12/20/20 0855 12/20/20 0903  BP: (!) 149/97 (!) 172/104  Pulse: 78 79  Resp: 14 12  Temp:    SpO2: 98% 99%    Last Pain:  Vitals:   12/20/20 0903  TempSrc:   PainSc: 0-No pain                 Molli Barrows

## 2020-12-20 NOTE — Op Note (Signed)
[facility] Gastroenterology Patient Name: Gerald Ellis Procedure Date: 12/20/2020 7:11 AM MRN: [id number] Account #: [id number] Date of Birth: [birth year] Admit Type: Outpatient Age: 55 Room: [facility] ENDO ROOM 1 Gender: Male Note Status: Finalized Procedure:             Colonoscopy Indications:           High risk colon cancer surveillance: Personal history                         of colonic polyps Providers:             Eithan Bellow, MD Referring MD:          Irven Easterly. Kary Kos, MD (Referring MD) Medicines:             Propofol per Anesthesia Complications:         No immediate complications. Procedure:             Pre-Anesthesia Assessment:                        - Prior to the procedure, a History and Physical was                         performed, and patient medications, allergies and                         sensitivities were reviewed. The patient's tolerance                         of previous anesthesia was reviewed.                        - The risks and benefits of the procedure and the                         sedation options and risks were discussed with the                         patient. All questions were answered and informed                         consent was obtained.                        After obtaining informed consent, the colonoscope was                         passed under direct vision. Throughout the procedure,                         the patient's blood pressure, pulse, and oxygen                         saturations were monitored continuously. The                         Colonoscope was introduced through the anus and                         advanced to the the terminal  ileum. The colonoscopy                         was performed without difficulty. The patient                         tolerated the procedure well. The quality of the bowel                         preparation was good. Findings:      Three sessile polyps were  found in the sigmoid colon, proximal       transverse colon and mid transverse colon. The polyps were 5 to 10 mm in       size. These polyps were removed with a hot snare. Resection and       retrieval were complete.      Multiple large-mouthed diverticula were found in the recto-sigmoid       colon, sigmoid colon, hepatic flexure and ascending colon.      The retroflexed view of the distal rectum and anal verge was normal and       showed no anal or rectal abnormalities.      The terminal ileum appeared normal. Impression:            - Three 5 to 10 mm polyps in the sigmoid colon, in the                         proximal transverse colon and in the mid transverse                         colon, removed with a hot snare. Resected and                         retrieved.                        - Diverticulosis in the recto-sigmoid colon, in the                         sigmoid colon, at the hepatic flexure and in the                         ascending colon.                        - The distal rectum and anal verge are normal on                         retroflexion view.                        - The examined portion of the ileum was normal. Recommendation:        - Telephone endoscopist for pathology results in 1                         week. Procedure Code(s):     --- Professional ---                        [phone], Colonoscopy, flexible; with removal of  tumor(s), polyp(s), or other lesion(s) by snare                         technique Diagnosis Code(s):     --- Professional ---                        Z86.010, Personal history of colonic polyps                        K63.5, Polyp of colon                        K57.30, Diverticulosis of large intestine without                         perforation or abscess without bleeding CPT copyright 2019 [employer]. All rights reserved. The codes documented in this report are preliminary and upon coder review may   be revised to meet current compliance requirements. Creston Bellow, MD 12/20/2020 8:34:31 AM This report has been signed electronically. Number of Addenda: 0 Note Initiated On: 12/20/2020 7:11 AM Scope Withdrawal Time: 0 hours 15 minutes 12 seconds  Total Procedure Duration: 0 hours 21 minutes 29 seconds  Estimated Blood Loss:  Estimated blood loss: none.      [facility]

## 2020-12-20 NOTE — Transfer of Care (Signed)
Immediate Anesthesia Transfer of Care Note  Patient: Gerald Ellis  Procedure(s) Performed: COLONOSCOPY WITH PROPOFOL ESOPHAGOGASTRODUODENOSCOPY (EGD) WITH PROPOFOL  Patient Location: PACU and Endoscopy Unit  Anesthesia Type:General  Level of Consciousness: awake  Airway & Oxygen Therapy: Patient Spontanous Breathing  Post-op Assessment: Report given to RN and Post -op Vital signs reviewed and stable  Post vital signs: Reviewed and stable  Last Vitals:  Vitals Value Taken Time  BP 95/57 12/20/20 0835  Temp    Pulse 76 12/20/20 0835  Resp 18 12/20/20 0835  SpO2 96 % 12/20/20 0835  Vitals shown include unvalidated device data.  Last Pain:  Vitals:   12/20/20 0835  TempSrc:   PainSc: 0-No pain         Complications: No notable events documented.

## 2020-12-20 NOTE — Anesthesia Preprocedure Evaluation (Signed)
Anesthesia Evaluation  Patient identified by MRN, date of birth, ID band Patient awake    Reviewed: Allergy & Precautions, H&P , NPO status , Patient's Chart, lab work & pertinent test results, reviewed documented beta blocker date and time   Airway Mallampati: III   Neck ROM: full    Dental  (+) Poor Dentition   Pulmonary asthma ,    Pulmonary exam normal        Cardiovascular Exercise Tolerance: Good hypertension, On Medications + CAD and + Peripheral Vascular Disease  Normal cardiovascular exam Rhythm:regular Rate:Normal     Neuro/Psych  Headaches, PSYCHIATRIC DISORDERS Depression  Neuromuscular disease    GI/Hepatic negative GI ROS, Neg liver ROS,   Endo/Other  negative endocrine ROSdiabetes  Renal/GU negative Renal ROS  negative genitourinary   Musculoskeletal   Abdominal   Peds  Hematology negative hematology ROS (+)   Anesthesia Other Findings Past Medical History: No date: Benign enlargement of prostate No date: Bronchitis No date: Childhood asthma No date: Diabetes mellitus without complication (HCC) No date: Environmental allergies No date: Erectile dysfunction No date: Headache No date: Hemorrhoids No date: Hypercholesteremia No date: Hypertension No date: Hypogonadism in male No date: Lumbago No date: Over weight Past Surgical History: 1992: BACK SURGERY No date: CHOLECYSTECTOMY 08/07/2015: COLONOSCOPY WITH PROPOFOL; N/A     Comment:  Procedure: COLONOSCOPY WITH PROPOFOL;  Surgeon: Lollie Sails, MD;  Location: [facility] ENDOSCOPY;  Service:               Endoscopy;  Laterality: N/A; 08/08/2015: COLONOSCOPY WITH PROPOFOL; N/A     Comment:  Procedure: COLONOSCOPY WITH PROPOFOL;  Surgeon: Lollie Sails, MD;  Location: [facility] ENDOSCOPY;  Service:               Endoscopy;  Laterality: N/A; 09/16/2017: COLONOSCOPY WITH PROPOFOL; N/A     Comment:  Procedure: COLONOSCOPY  WITH PROPOFOL;  Surgeon:               Lollie Sails, MD;  Location: [facility] ENDOSCOPY;                Service: Endoscopy;  Laterality: N/A; 04/20/2019: CORONARY STENT INTERVENTION; N/A     Comment:  Procedure: CORONARY STENT INTERVENTION;  Surgeon:               Isaias Cowman, MD;  Location: [place] CV               LAB;  Service: Cardiovascular;  Laterality: N/A; 08/07/2015: ESOPHAGOGASTRODUODENOSCOPY (EGD) WITH PROPOFOL; N/A     Comment:  Procedure: ESOPHAGOGASTRODUODENOSCOPY (EGD) WITH               PROPOFOL;  Surgeon: Lollie Sails, MD;  Location:               [facility] ENDOSCOPY;  Service: Endoscopy;  Laterality: N/A; 09/16/2017: ESOPHAGOGASTRODUODENOSCOPY (EGD) WITH PROPOFOL; N/A     Comment:  Procedure: ESOPHAGOGASTRODUODENOSCOPY (EGD) WITH               PROPOFOL;  Surgeon: Lollie Sails, MD;  Location:               [facility] ENDOSCOPY;  Service: Endoscopy;  Laterality: N/A; 04/20/2019: LEFT HEART CATH AND CORONARY ANGIOGRAPHY; Left     Comment:  Procedure: LEFT HEART CATH AND CORONARY ANGIOGRAPHY;  Surgeon: Isaias Cowman, MD;  Location: [place] [facility] LAB;  Service: Cardiovascular;  Laterality:               Left; BMI    Body Mass Index: 33.29 kg/m     Reproductive/Obstetrics negative OB ROS                             Anesthesia Physical Anesthesia Plan  ASA: 3  Anesthesia Plan: General   Post-op Pain Management:    Induction:   PONV Risk Score and Plan:   Airway Management Planned:   Additional Equipment:   Intra-op Plan:   Post-operative Plan:   Informed Consent: I have reviewed the patients History and Physical, chart, labs and discussed the procedure including the risks, benefits and alternatives for the proposed anesthesia with the patient or authorized representative who has indicated his/her understanding and acceptance.     Dental Advisory Given  Plan Discussed with:  CRNA  Anesthesia Plan Comments:         Anesthesia Quick Evaluation

## 2020-12-21 ENCOUNTER — Encounter: Payer: Self-pay | Admitting: General Surgery

## 2020-12-21 LAB — SURGICAL PATHOLOGY

## 2021-03-21 ENCOUNTER — Emergency Department: Payer: Medicare Other

## 2021-03-21 ENCOUNTER — Other Ambulatory Visit: Payer: Self-pay

## 2021-03-21 DIAGNOSIS — I2510 Atherosclerotic heart disease of native coronary artery without angina pectoris: Secondary | ICD-10-CM | POA: Insufficient documentation

## 2021-03-21 DIAGNOSIS — E119 Type 2 diabetes mellitus without complications: Secondary | ICD-10-CM | POA: Diagnosis not present

## 2021-03-21 DIAGNOSIS — Z955 Presence of coronary angioplasty implant and graft: Secondary | ICD-10-CM | POA: Diagnosis not present

## 2021-03-21 DIAGNOSIS — R079 Chest pain, unspecified: Secondary | ICD-10-CM | POA: Diagnosis present

## 2021-03-21 DIAGNOSIS — R55 Syncope and collapse: Secondary | ICD-10-CM | POA: Insufficient documentation

## 2021-03-21 DIAGNOSIS — Z794 Long term (current) use of insulin: Secondary | ICD-10-CM | POA: Diagnosis not present

## 2021-03-21 DIAGNOSIS — Z7984 Long term (current) use of oral hypoglycemic drugs: Secondary | ICD-10-CM | POA: Diagnosis not present

## 2021-03-21 DIAGNOSIS — J45909 Unspecified asthma, uncomplicated: Secondary | ICD-10-CM | POA: Insufficient documentation

## 2021-03-21 DIAGNOSIS — Z7982 Long term (current) use of aspirin: Secondary | ICD-10-CM | POA: Diagnosis not present

## 2021-03-21 DIAGNOSIS — I10 Essential (primary) hypertension: Secondary | ICD-10-CM | POA: Insufficient documentation

## 2021-03-21 LAB — BASIC METABOLIC PANEL
Anion gap: 9 (ref 5–15)
BUN: 11 mg/dL (ref 6–20)
CO2: 25 mmol/L (ref 22–32)
Calcium: 9.2 mg/dL (ref 8.9–10.3)
Chloride: 98 mmol/L (ref 98–111)
Creatinine, Ser: 0.72 mg/dL (ref 0.61–1.24)
GFR, Estimated: >60 mL/min (ref >60)
Glucose, Bld: 457 mg/dL — ABNORMAL HIGH (ref 70–99)
Potassium: 3.4 mmol/L — ABNORMAL LOW (ref 3.5–5.1)
Sodium: 132 mmol/L — ABNORMAL LOW (ref 135–145)

## 2021-03-21 LAB — CBC
HCT: 44.6 % (ref 39.0–52.0)
Hemoglobin: 15.9 g/dL (ref 13.0–17.0)
MCH: 29.7 pg (ref 26.0–34.0)
MCHC: 35.7 g/dL (ref 30.0–36.0)
MCV: 83.2 fL (ref 80.0–100.0)
Platelets: 300 10*3/uL (ref 150–400)
RBC: 5.36 MIL/uL (ref 4.22–5.81)
RDW: 11.9 % (ref 11.5–15.5)
WBC: 11.2 10*3/uL — ABNORMAL HIGH (ref 4.0–10.5)
nRBC: 0 % (ref 0.0–0.2)

## 2021-03-21 LAB — PROTIME-INR
INR: 1 (ref 0.8–1.2)
Prothrombin Time: 13.1 seconds (ref 11.4–15.2)

## 2021-03-21 LAB — TROPONIN I (HIGH SENSITIVITY): Troponin I (High Sensitivity): 12 ng/L (ref <18)

## 2021-03-21 MED ORDER — IOHEXOL 350 MG/ML SOLN
75.0000 mL | Freq: Once | INTRAVENOUS | Status: AC | PRN
  Administered 2021-03-22: 75 mL via INTRAVENOUS

## 2021-03-21 NOTE — ED Triage Notes (Signed)
Pt comes into the ED via EMS from home with sudden onset chest pain with episode of syncope, chest pain subsided after nitro  [id number] 230/130 Nitro paste on  324mg  ASA #[id number]

## 2021-03-21 NOTE — ED Provider Notes (Signed)
Emergency Medicine Provider Triage Evaluation Note  Gerald Ellis , a 55 y.o. male  was evaluated in triage.  Pt complains of syncopal episode that occurred earlier today.  Patient states he became very dizzy while he was sitting getting into his truck.  Felt as if he was going to pass out.  No fall or injury.  EMS was called.  Patient believes he did pass out and when he woke up he was having chest pain.  He was given some nitroglycerin which seemed to alleviate the chest pain.  Chest pain has slightly returned, he describes chest wall pressure.  No nausea or vomiting.  Feels a little fatigued and dizzy at this time.  No abdominal pain.  Patient does have a history of cardiac stent placement over the last year.  Review of Systems  Positive: Chest pain, fatigue, syncopal episode Negative: Shortness of breath  Physical Exam  BP (!) 182/103 (BP Location: Right Arm)   Pulse 99   Temp 98.2 F (36.8 C) (Oral)   Resp 19   Ht 5\' 10"  (1.778 m)   Wt 102.1 kg   SpO2 97%   BMI 32.28 kg/m  Gen:   Awake, no distress presents in wheelchair Resp:  Normal effort no respiratory distress MSK:   Moves extremities without difficulty  Other:    Medical Decision Making  Medically screening exam initiated at 6:53 PM.  Appropriate orders placed.  [name] was informed that the remainder of the evaluation will be completed by another provider, this initial triage assessment does not replace that evaluation, and the importance of remaining in the ED until their evaluation is complete.  55 year old male with syncopal episode and cardiac pain.  We will obtain EKG and perform cardiac work-up.  Labs and imaging pending   Renata Caprice 03/21/21 Nash Dimmer, MD 03/22/21 [phone]

## 2021-03-22 ENCOUNTER — Emergency Department
Admission: EM | Admit: 2021-03-22 | Discharge: 2021-03-22 | Disposition: A | Discharge status: Home / Self Care | Payer: Medicare Other | Attending: Emergency Medicine | Admitting: Emergency Medicine

## 2021-03-22 DIAGNOSIS — R079 Chest pain, unspecified: Secondary | ICD-10-CM

## 2021-03-22 DIAGNOSIS — R55 Syncope and collapse: Secondary | ICD-10-CM

## 2021-03-22 LAB — HEPATIC FUNCTION PANEL
ALT: 19 U/L (ref 0–44)
AST: 18 U/L (ref 15–41)
Albumin: 3.7 g/dL (ref 3.5–5.0)
Alkaline Phosphatase: 87 U/L (ref 38–126)
Bilirubin, Direct: <0.1 mg/dL (ref 0.0–0.2)
Total Bilirubin: 1 mg/dL (ref 0.3–1.2)
Total Protein: 7.1 g/dL (ref 6.5–8.1)

## 2021-03-22 LAB — TROPONIN I (HIGH SENSITIVITY): Troponin I (High Sensitivity): 13 ng/L (ref <18)

## 2021-03-22 LAB — LIPASE, BLOOD: Lipase: 35 U/L (ref 11–51)

## 2021-03-22 NOTE — Discharge Instructions (Signed)
Continue to take your medicines daily as directed by your doctor.  Drink plenty of fluids daily.  Return to the ER for worsening symptoms, persistent vomiting, difficulty breathing or other concerns.

## 2021-03-22 NOTE — ED Provider Notes (Signed)
[facility] Emergency Department Provider Note   ____________________________________________   Event Date/Time   First MD Initiated Contact with Patient 03/22/21 0310     (approximate)  I have reviewed the triage vital signs and the nursing notes.   HISTORY  Chief Complaint Chest Pain    HPI Gerald Ellis is a 55 y.o. male brought to the ED via EMS from home with a chief complaint of chest pain with syncope.  Patient has a history of CAD status post stent, diabetes, hypertension, hyperlipidemia who was at a friend's house when he developed central chest pain.  He became vertiginous and lightheaded when he stepped in his truck.  Decided not to drive and thinks he passed out.  Received 324 mg aspirin and Nitropaste per EMS with improvement in symptoms.  Denies associated diaphoresis, shortness of breath, nausea or vomiting.  Denies recent fever, cough, diarrhea.  Denies recent travel or trauma.     Past Medical History:  Diagnosis Date   Benign enlargement of prostate    Bronchitis    Childhood asthma    Diabetes mellitus without complication ([place])    Environmental allergies    Erectile dysfunction    Headache    Hemorrhoids    Hypercholesteremia    Hypertension    Hypogonadism in male    Lumbago    Over weight     Patient Active Problem List   Diagnosis Date Noted   Chronic venous insufficiency 06/29/2020   Lymphedema 06/29/2020   PAD (peripheral artery disease) ([place]) 05/30/2020   Ankle ulcer, right, with fat layer exposed ([place]) 05/30/2020   Allergy 05/29/2020   Asthma without status asthmaticus 05/29/2020   Diabetes mellitus type 2, uncomplicated ([place]) 16/02/9603   S/P coronary artery stent placement 04/30/2019   CAD (coronary artery disease) 04/20/2019   Abnormal ECG 03/18/2019   Chest pain with high risk for cardiac etiology 03/18/2019   SOB (shortness of breath) on exertion 03/18/2019   Hypercholesterolemia 05/07/2015   Erectile  dysfunction of organic origin 11/22/2014   Hypogonadism in male 11/22/2014   BPH with obstruction/lower urinary tract symptoms 11/22/2014   DDD (degenerative disc disease), lumbar 10/13/2014   Status post lumbar laminectomy 10/13/2014   DJD of shoulder 10/13/2014   Bilateral occipital neuralgia 10/13/2014   Lumbar radiculopathy 10/13/2014   DDD (degenerative disc disease), cervical 10/13/2014   Depression 08/25/2013   Essential hypertension 08/25/2013   History of hemorrhoids 08/25/2013   Low back pain 08/25/2013    Past Surgical History:  Procedure Laterality Date   BACK SURGERY  1992   CHOLECYSTECTOMY     COLONOSCOPY WITH PROPOFOL N/A 08/07/2015   Procedure: COLONOSCOPY WITH PROPOFOL;  Surgeon: Lollie Sails, MD;  Location: [facility] ENDOSCOPY;  Service: Endoscopy;  Laterality: N/A;   COLONOSCOPY WITH PROPOFOL N/A 08/08/2015   Procedure: COLONOSCOPY WITH PROPOFOL;  Surgeon: Lollie Sails, MD;  Location: [facility] ENDOSCOPY;  Service: Endoscopy;  Laterality: N/A;   COLONOSCOPY WITH PROPOFOL N/A 09/16/2017   Procedure: COLONOSCOPY WITH PROPOFOL;  Surgeon: Lollie Sails, MD;  Location: [facility] ENDOSCOPY;  Service: Endoscopy;  Laterality: N/A;   COLONOSCOPY WITH PROPOFOL N/A 12/20/2020   Procedure: COLONOSCOPY WITH PROPOFOL;  Surgeon: Osmani Bellow, MD;  Location: [facility] ENDOSCOPY;  Service: Endoscopy;  Laterality: N/A;  IDDM   CORONARY STENT INTERVENTION N/A 04/20/2019   Procedure: CORONARY STENT INTERVENTION;  Surgeon: Isaias Cowman, MD;  Location: [place] CV LAB;  Service: Cardiovascular;  Laterality: N/A;   ESOPHAGOGASTRODUODENOSCOPY (EGD)  WITH PROPOFOL N/A 08/07/2015   Procedure: ESOPHAGOGASTRODUODENOSCOPY (EGD) WITH PROPOFOL;  Surgeon: Lollie Sails, MD;  Location: [facility] ENDOSCOPY;  Service: Endoscopy;  Laterality: N/A;   ESOPHAGOGASTRODUODENOSCOPY (EGD) WITH PROPOFOL N/A 09/16/2017   Procedure: ESOPHAGOGASTRODUODENOSCOPY (EGD) WITH PROPOFOL;  Surgeon: Lollie Sails, MD;  Location: [facility] ENDOSCOPY;  Service: Endoscopy;  Laterality: N/A;   ESOPHAGOGASTRODUODENOSCOPY (EGD) WITH PROPOFOL N/A 12/20/2020   Procedure: ESOPHAGOGASTRODUODENOSCOPY (EGD) WITH PROPOFOL;  Surgeon: Javad Bellow, MD;  Location: [facility] ENDOSCOPY;  Service: Endoscopy;  Laterality: N/A;   LEFT HEART CATH AND CORONARY ANGIOGRAPHY Left 04/20/2019   Procedure: LEFT HEART CATH AND CORONARY ANGIOGRAPHY;  Surgeon: Isaias Cowman, MD;  Location: [place] CV LAB;  Service: Cardiovascular;  Laterality: Left;    Prior to Admission medications   Medication Sig Start Date End Date Taking? Authorizing Provider  amLODipine (NORVASC) 5 MG tablet Take 1 tablet (5 mg total) by mouth daily. 04/21/19   Clabe Seal, PA-C  aspirin 81 MG chewable tablet Chew 1 tablet (81 mg total) by mouth daily. 04/21/19   Clabe Seal, PA-C  atorvastatin (LIPITOR) 80 MG tablet Take 1 tablet (80 mg total) by mouth daily at 6 PM. 04/21/19   Clabe Seal, PA-C  busPIRone (BUSPAR) 7.5 MG tablet Take 7.5 mg by mouth 2 (two) times daily.    [provider]  cetirizine (ZYRTEC) 10 MG tablet Take 10 mg by mouth daily.    [provider]  cyclobenzaprine (FLEXERIL) 10 MG tablet TAKE 1/2 TO 1 (ONE-HALF TO ONE) TABLET BY MOUTH PER DAY OR TWICE DAILY IF TOLERATED. 06/08/20   [provider]  diclofenac Sodium (VOLTAREN) 1 % GEL Apply 2 g topically 4 (four) times daily.    [provider]  fluticasone (FLONASE) 50 MCG/ACT nasal spray Place 1 spray into both nostrils 2 (two) times daily as needed (nasal congestion).    [provider]  Fluticasone-Umeclidin-Vilant 200-62.5-25 MCG/INH AEPB Inhale into the lungs. 04/24/20   [provider]  HYDROcodone-acetaminophen (NORCO/VICODIN) 5-325 MG tablet Limit 1 tab by mouth per day or twice a day if tolerated Patient taking differently: Take 0.5-1 tablets by mouth every 4 (four) hours as needed (pain.). 01/04/16   Mohammed Kindle, MD  insulin  regular human CONCENTRATED (HUMULIN R U-500 KWIKPEN) 500 UNIT/ML kwikpen Inject 30 Units into the skin 2 (two) times daily.    [provider]  Ipratropium-Albuterol (COMBIVENT) 20-100 MCG/ACT AERS respimat Inhale into the lungs. 12/15/19 12/14/20  [provider]  isosorbide mononitrate (IMDUR) 30 MG 24 hr tablet Take 30 mg by mouth daily.    [provider]  lisinopril (ZESTRIL) 20 MG tablet Take 1 tablet (20 mg total) by mouth daily. 04/21/19   Clabe Seal, PA-C  metFORMIN (GLUCOPHAGE) 1000 MG tablet Take 1,000 mg by mouth 2 (two) times daily.     [provider]  metoCLOPramide (REGLAN) 5 MG tablet Take 5 mg by mouth 2 (two) times daily.    [provider]  metoprolol succinate (TOPROL-XL) 50 MG 24 hr tablet Take 50 mg by mouth daily. Take with or immediately following a meal.    [provider]  montelukast (SINGULAIR) 10 MG tablet Take by mouth. 12/15/19 12/14/20  [provider]  Multiple Vitamins-Minerals (MULTIVITAMIN WITH MINERALS) tablet Take 1 tablet by mouth 2 (two) times daily.     [provider]  mupirocin ointment (BACTROBAN) 2 % SMARTSIG:1 Application Topical 2-3 Times Daily 04/06/20   [provider]  Omega-3 Fatty Acids (  FISH OIL) 500 MG CAPS Take 500 mg by mouth 2 (two) times daily.    [provider]  pantoprazole (PROTONIX) 40 MG tablet Take 40 mg by mouth 2 (two) times daily.  04/07/15   [provider]  pioglitazone (ACTOS) 15 MG tablet Take 15 mg by mouth 2 (two) times daily.    [provider]  prasugrel (EFFIENT) 10 MG TABS tablet Take 1 tablet (10 mg total) by mouth daily. 04/21/19   Clabe Seal, PA-C  silver sulfADIAZINE (SILVADENE) 1 % cream Apply 1 application topically daily. 05/30/20   Schnier, Dolores Lory, MD  tadalafil (CIALIS) 20 MG tablet Take 20 mg by mouth daily as needed for erectile dysfunction.    [provider]    Allergies Ibuprofen and  Nsaids  Family History  Problem Relation Age of Onset   Heart disease Mother        CABG   Cancer Mother        Disseminated   Hypertension Mother    Hyperlipidemia Mother    Diabetes Father        mother   Alzheimer's disease Other    Kidney disease Neg Hx    Prostate cancer Neg Hx     Social History Social History   Tobacco Use   Smoking status: Never   Smokeless tobacco: Never  Substance Use Topics   Alcohol use: Yes    Alcohol/week: 0.0 standard drinks    Comment: occasionally   Drug use: No    Review of Systems  Constitutional: No fever/chills Eyes: No visual changes. ENT: No sore throat. Cardiovascular: Positive for chest pain. Respiratory: Denies shortness of breath. Gastrointestinal: No abdominal pain.  No nausea, no vomiting.  No diarrhea.  No constipation. Genitourinary: Negative for dysuria. Musculoskeletal: Negative for back pain. Skin: Negative for rash. Neurological: Positive for dizziness and syncope.  Negative for headaches, focal weakness or numbness.   ____________________________________________   PHYSICAL EXAM:  VITAL SIGNS: ED Triage Vitals  Enc Vitals Group     BP 03/21/21 1841 (!) 182/103     Pulse Rate 03/21/21 1841 99     Resp 03/21/21 1841 19     Temp 03/21/21 1841 98.2 F (36.8 C)     Temp Source 03/21/21 1841 Oral     SpO2 03/21/21 1841 97 %     Weight 03/21/21 1842 225 lb (102.1 kg)     Height 03/21/21 1842 5\' 10"  (1.778 m)     Head Circumference --      Peak Flow --      Pain Score 03/21/21 1842 5     Pain Loc --      Pain Edu? --      Excl. in [place]? --     Constitutional: Alert and oriented. Well appearing and in no acute distress. Eyes: Conjunctivae are normal. PERRL. EOMI. Head: Atraumatic. Nose: No congestion/rhinnorhea. Mouth/Throat: Mucous membranes are moist.   Neck: No stridor.  No carotid bruits. Cardiovascular: Normal rate, regular rhythm. Grossly normal heart sounds.  Good peripheral  circulation. Respiratory: Normal respiratory effort.  No retractions. Lungs CTAB. Gastrointestinal: Soft and nontender to light or deep palpation. No distention. No abdominal bruits. No CVA tenderness. Musculoskeletal: No lower extremity tenderness nor edema.  No joint effusions. Neurologic: Alert and oriented x3.  CN II to XII grossly intact.  Normal speech and language. No gross focal neurologic deficits are appreciated.  Skin:  Skin is warm, dry and intact. No rash noted. Psychiatric: Mood  and affect are normal. Speech and behavior are normal.  ____________________________________________   LABS (all labs ordered are listed, but only abnormal results are displayed)  Labs Reviewed  BASIC METABOLIC PANEL - Abnormal; Notable for the following components:      Result Value   Sodium 132 (*)    Potassium 3.4 (*)    Glucose, Bld 457 (*)    All other components within normal limits  CBC - Abnormal; Notable for the following components:   WBC 11.2 (*)    All other components within normal limits  PROTIME-INR  HEPATIC FUNCTION PANEL  LIPASE, BLOOD  TROPONIN I (HIGH SENSITIVITY)  TROPONIN I (HIGH SENSITIVITY)   ____________________________________________  EKG  ED ECG REPORT I, Shekinah Pitones J, the attending physician, personally viewed and interpreted this ECG.   Date: 03/22/2021  EKG Time: 1844  Rate: 98  Rhythm: normal sinus rhythm  Axis: Normal  Intervals:none  ST&T Change: Nonspecific  ____________________________________________  RADIOLOGY I, Amore Ackman J, personally viewed and evaluated these images (plain radiographs) as part of my medical decision making, as well as reviewing the written report by the radiologist.  ED MD interpretation: No acute cardiopulmonary process; no ICH, no PE  Official radiology report(s): DG Chest 2 View  Result Date: 03/21/2021 CLINICAL DATA:  Sudden chest pain. EXAM: CHEST - 2 VIEW COMPARISON:  Oct 08, 2020 FINDINGS: The heart size and  mediastinal contours are within normal limits. Both lungs are clear. Multilevel degenerative changes are seen throughout the thoracic spine. IMPRESSION: No active cardiopulmonary disease. Electronically Signed   By: Virgina Norfolk M.D.   On: 03/21/2021 18:53   CT Head Wo Contrast  Result Date: 03/22/2021 CLINICAL DATA:  Encephalopathy EXAM: CT HEAD WITHOUT CONTRAST TECHNIQUE: Contiguous axial images were obtained from the base of the skull through the vertex without intravenous contrast. COMPARISON:  None. FINDINGS: Brain: There is no mass, hemorrhage or extra-axial collection. The size and configuration of the ventricles and extra-axial CSF spaces are normal. The brain parenchyma is normal, without acute or chronic infarction. Vascular: No abnormal hyperdensity of the major intracranial arteries or dural venous sinuses. No intracranial atherosclerosis. Skull: The visualized skull base, calvarium and extracranial soft tissues are normal. Sinuses/Orbits: No fluid levels or advanced mucosal thickening of the visualized paranasal sinuses. No mastoid or middle ear effusion. The orbits are normal. IMPRESSION: Normal head CT. Electronically Signed   By: Ulyses Jarred M.D.   On: 03/22/2021 00:34   CT Angio Chest PE W/Cm &/Or Wo Cm  Result Date: 03/22/2021 CLINICAL DATA:  PE suspected, low/intermediate prob, neg D-dimer chest pain, syncope EXAM: CT ANGIOGRAPHY CHEST WITH CONTRAST TECHNIQUE: Multidetector CT imaging of the chest was performed using the standard protocol during bolus administration of intravenous contrast. Multiplanar CT image reconstructions and MIPs were obtained to evaluate the vascular anatomy. CONTRAST:  68mL OMNIPAQUE IOHEXOL 350 MG/ML SOLN COMPARISON:  None. FINDINGS: Cardiovascular: Contrast injection is sufficient to demonstrate satisfactory opacification of the pulmonary arteries to the segmental level. There is no pulmonary embolus or evidence of right heart strain. The size of the main  pulmonary artery is normal. Heart size is normal, with no pericardial effusion. The course and caliber of the aorta are normal. There is no atherosclerotic calcification. Opacification decreased due to pulmonary arterial phase contrast bolus timing. Mediastinum/Nodes: No mediastinal, hilar or axillary lymphadenopathy. Normal visualized thyroid. Thoracic esophageal course is normal. Lungs/Pleura: Airways are patent. No pleural effusion, lobar consolidation, pneumothorax or pulmonary infarction. Upper Abdomen: Contrast bolus timing is not  optimized for evaluation of the abdominal organs. The visualized portions of the organs of the upper abdomen are normal. Musculoskeletal: No chest wall abnormality. No bony spinal canal stenosis. Review of the MIP images confirms the above findings. IMPRESSION: No pulmonary embolus or other acute thoracic abnormality. Electronically Signed   By: Ulyses Jarred M.D.   On: 03/22/2021 00:23    ____________________________________________   PROCEDURES  Procedure(s) performed (including Critical Care):  .1-3 Lead EKG Interpretation Performed by: Paulette Blanch, MD Authorized by: Paulette Blanch, MD     Interpretation: normal     ECG rate:  80   ECG rate assessment: normal     Rhythm: sinus rhythm     Ectopy: none     Conduction: normal   Comments:     Patient placed on cardiac monitor to evaluate for arrhythmias   ____________________________________________   INITIAL IMPRESSION / ASSESSMENT AND PLAN / ED COURSE  As part of my medical decision making, I reviewed the following data within the [place] notes reviewed and incorporated, Labs reviewed, EKG interpreted, Old chart reviewed (01/30/2021 cardiology office visit), Radiograph reviewed, and Notes from prior ED visits     55 year old male presenting with chest pain and syncope. Differential diagnosis includes, but is not limited to, ACS, aortic dissection, pulmonary embolism, cardiac  tamponade, pneumothorax, pneumonia, pericarditis, myocarditis, GI-related causes including esophagitis/gastritis, and musculoskeletal chest wall pain.     Laboratory results unremarkable other than hyperglycemia without elevation of anion gap.  2 sets of troponins negative with unremarkable EKG.  CT head and chest unremarkable.  Patient is feeling significantly better, just hungry.  Offered admission but patient is feeling much better and desires to go home.  Will eat snack, perform orthostatic vital signs and reassess.  ----------------------------------------- 4:24 AM on 03/22/2021 -----------------------------------------   Mild orthostasis but patient was not dizzy.  Declines IV fluids.  Feels much better after he has eaten a snack and desires to be discharged home.  Strict return precautions given.  Patient verbalizes understanding and agrees with plan of care.     ____________________________________________   FINAL CLINICAL IMPRESSION(S) / ED DIAGNOSES  Final diagnoses:  Nonspecific chest pain  Syncope, unspecified syncope type     ED Discharge Orders     None        Note:  This document was prepared using Dragon voice recognition software and may include unintentional dictation errors.    Paulette Blanch, MD 03/22/21 [phone]

## 2021-07-06 ENCOUNTER — Other Ambulatory Visit: Payer: Self-pay | Admitting: Infectious Diseases

## 2021-07-06 DIAGNOSIS — R1032 Left lower quadrant pain: Secondary | ICD-10-CM | POA: Diagnosis not present

## 2021-07-06 DIAGNOSIS — B009 Herpesviral infection, unspecified: Secondary | ICD-10-CM | POA: Diagnosis not present

## 2021-07-06 DIAGNOSIS — R112 Nausea with vomiting, unspecified: Secondary | ICD-10-CM | POA: Diagnosis not present

## 2021-07-06 DIAGNOSIS — E1165 Type 2 diabetes mellitus with hyperglycemia: Secondary | ICD-10-CM | POA: Diagnosis not present

## 2021-07-06 DIAGNOSIS — J679 Hypersensitivity pneumonitis due to unspecified organic dust: Secondary | ICD-10-CM | POA: Diagnosis not present

## 2021-07-06 DIAGNOSIS — R634 Abnormal weight loss: Secondary | ICD-10-CM | POA: Diagnosis not present

## 2021-07-09 DIAGNOSIS — J670 Farmer's lung: Secondary | ICD-10-CM | POA: Diagnosis not present

## 2021-07-17 DIAGNOSIS — L97512 Non-pressure chronic ulcer of other part of right foot with fat layer exposed: Secondary | ICD-10-CM | POA: Diagnosis not present

## 2021-07-17 DIAGNOSIS — E1142 Type 2 diabetes mellitus with diabetic polyneuropathy: Secondary | ICD-10-CM | POA: Diagnosis not present

## 2021-07-18 ENCOUNTER — Ambulatory Visit: Admission: RE | Admit: 2021-07-18 | Payer: Medicare Other | Source: Ambulatory Visit

## 2021-07-19 DIAGNOSIS — Z7189 Other specified counseling: Secondary | ICD-10-CM | POA: Diagnosis not present

## 2021-07-19 DIAGNOSIS — F4321 Adjustment disorder with depressed mood: Secondary | ICD-10-CM | POA: Diagnosis not present

## 2021-07-23 ENCOUNTER — Ambulatory Visit
Admission: RE | Admit: 2021-07-23 | Discharge: 2021-07-23 | Disposition: A | Discharge status: Home / Self Care | Payer: PPO | Source: Ambulatory Visit | Attending: Infectious Diseases | Admitting: Infectious Diseases

## 2021-07-23 ENCOUNTER — Other Ambulatory Visit: Payer: Self-pay

## 2021-07-23 DIAGNOSIS — I700 Atherosclerosis of aorta: Secondary | ICD-10-CM | POA: Diagnosis not present

## 2021-07-23 DIAGNOSIS — R112 Nausea with vomiting, unspecified: Secondary | ICD-10-CM | POA: Insufficient documentation

## 2021-07-23 DIAGNOSIS — R634 Abnormal weight loss: Secondary | ICD-10-CM | POA: Diagnosis not present

## 2021-07-23 DIAGNOSIS — R160 Hepatomegaly, not elsewhere classified: Secondary | ICD-10-CM | POA: Diagnosis not present

## 2021-07-23 DIAGNOSIS — R1110 Vomiting, unspecified: Secondary | ICD-10-CM | POA: Diagnosis not present

## 2021-07-23 MED ORDER — IOHEXOL 300 MG/ML  SOLN
100.0000 mL | Freq: Once | INTRAMUSCULAR | Status: AC | PRN
  Administered 2021-07-23: 100 mL via INTRAVENOUS

## 2021-07-31 DIAGNOSIS — E11621 Type 2 diabetes mellitus with foot ulcer: Secondary | ICD-10-CM | POA: Diagnosis not present

## 2021-07-31 DIAGNOSIS — Z794 Long term (current) use of insulin: Secondary | ICD-10-CM | POA: Diagnosis not present

## 2021-07-31 DIAGNOSIS — E1169 Type 2 diabetes mellitus with other specified complication: Secondary | ICD-10-CM | POA: Diagnosis not present

## 2021-07-31 DIAGNOSIS — E1129 Type 2 diabetes mellitus with other diabetic kidney complication: Secondary | ICD-10-CM | POA: Diagnosis not present

## 2021-07-31 DIAGNOSIS — I10 Essential (primary) hypertension: Secondary | ICD-10-CM | POA: Diagnosis not present

## 2021-07-31 DIAGNOSIS — E1159 Type 2 diabetes mellitus with other circulatory complications: Secondary | ICD-10-CM | POA: Diagnosis not present

## 2021-07-31 DIAGNOSIS — E113299 Type 2 diabetes mellitus with mild nonproliferative diabetic retinopathy without macular edema, unspecified eye: Secondary | ICD-10-CM | POA: Diagnosis not present

## 2021-07-31 DIAGNOSIS — R809 Proteinuria, unspecified: Secondary | ICD-10-CM | POA: Diagnosis not present

## 2021-07-31 DIAGNOSIS — E1143 Type 2 diabetes mellitus with diabetic autonomic (poly)neuropathy: Secondary | ICD-10-CM | POA: Diagnosis not present

## 2021-07-31 DIAGNOSIS — E1165 Type 2 diabetes mellitus with hyperglycemia: Secondary | ICD-10-CM | POA: Diagnosis not present

## 2021-07-31 DIAGNOSIS — E785 Hyperlipidemia, unspecified: Secondary | ICD-10-CM | POA: Diagnosis not present

## 2021-07-31 DIAGNOSIS — Z9114 Patient's other noncompliance with medication regimen: Secondary | ICD-10-CM | POA: Diagnosis not present

## 2021-08-03 DIAGNOSIS — E1165 Type 2 diabetes mellitus with hyperglycemia: Secondary | ICD-10-CM | POA: Diagnosis not present

## 2021-08-06 DIAGNOSIS — R634 Abnormal weight loss: Secondary | ICD-10-CM | POA: Diagnosis not present

## 2021-08-06 DIAGNOSIS — B009 Herpesviral infection, unspecified: Secondary | ICD-10-CM | POA: Diagnosis not present

## 2021-08-06 DIAGNOSIS — R112 Nausea with vomiting, unspecified: Secondary | ICD-10-CM | POA: Diagnosis not present

## 2021-08-06 DIAGNOSIS — E1165 Type 2 diabetes mellitus with hyperglycemia: Secondary | ICD-10-CM | POA: Diagnosis not present

## 2021-09-03 DIAGNOSIS — E1165 Type 2 diabetes mellitus with hyperglycemia: Secondary | ICD-10-CM | POA: Diagnosis not present

## 2021-09-04 DIAGNOSIS — E1169 Type 2 diabetes mellitus with other specified complication: Secondary | ICD-10-CM | POA: Diagnosis not present

## 2021-09-04 DIAGNOSIS — E1165 Type 2 diabetes mellitus with hyperglycemia: Secondary | ICD-10-CM | POA: Diagnosis not present

## 2021-09-04 DIAGNOSIS — E11621 Type 2 diabetes mellitus with foot ulcer: Secondary | ICD-10-CM | POA: Diagnosis not present

## 2021-09-04 DIAGNOSIS — Z794 Long term (current) use of insulin: Secondary | ICD-10-CM | POA: Diagnosis not present

## 2021-09-04 DIAGNOSIS — I10 Essential (primary) hypertension: Secondary | ICD-10-CM | POA: Diagnosis not present

## 2021-09-04 DIAGNOSIS — E1129 Type 2 diabetes mellitus with other diabetic kidney complication: Secondary | ICD-10-CM | POA: Diagnosis not present

## 2021-09-04 DIAGNOSIS — E1159 Type 2 diabetes mellitus with other circulatory complications: Secondary | ICD-10-CM | POA: Diagnosis not present

## 2021-09-04 DIAGNOSIS — E1143 Type 2 diabetes mellitus with diabetic autonomic (poly)neuropathy: Secondary | ICD-10-CM | POA: Diagnosis not present

## 2021-09-04 DIAGNOSIS — E785 Hyperlipidemia, unspecified: Secondary | ICD-10-CM | POA: Diagnosis not present

## 2021-09-04 DIAGNOSIS — R809 Proteinuria, unspecified: Secondary | ICD-10-CM | POA: Diagnosis not present

## 2021-09-04 DIAGNOSIS — E113299 Type 2 diabetes mellitus with mild nonproliferative diabetic retinopathy without macular edema, unspecified eye: Secondary | ICD-10-CM | POA: Diagnosis not present

## 2021-09-04 DIAGNOSIS — L97411 Non-pressure chronic ulcer of right heel and midfoot limited to breakdown of skin: Secondary | ICD-10-CM | POA: Diagnosis not present

## 2021-09-27 DIAGNOSIS — I2510 Atherosclerotic heart disease of native coronary artery without angina pectoris: Secondary | ICD-10-CM | POA: Diagnosis not present

## 2021-09-27 DIAGNOSIS — I10 Essential (primary) hypertension: Secondary | ICD-10-CM | POA: Diagnosis not present

## 2021-09-27 DIAGNOSIS — E7800 Pure hypercholesterolemia, unspecified: Secondary | ICD-10-CM | POA: Diagnosis not present

## 2021-09-27 DIAGNOSIS — R0602 Shortness of breath: Secondary | ICD-10-CM | POA: Diagnosis not present

## 2021-09-27 DIAGNOSIS — Z955 Presence of coronary angioplasty implant and graft: Secondary | ICD-10-CM | POA: Diagnosis not present

## 2021-10-03 DIAGNOSIS — E1165 Type 2 diabetes mellitus with hyperglycemia: Secondary | ICD-10-CM | POA: Diagnosis not present

## 2021-10-09 DIAGNOSIS — M79669 Pain in unspecified lower leg: Secondary | ICD-10-CM | POA: Diagnosis not present

## 2021-10-09 DIAGNOSIS — M792 Neuralgia and neuritis, unspecified: Secondary | ICD-10-CM | POA: Diagnosis not present

## 2021-10-09 DIAGNOSIS — M48062 Spinal stenosis, lumbar region with neurogenic claudication: Secondary | ICD-10-CM | POA: Diagnosis not present

## 2021-10-09 DIAGNOSIS — G8929 Other chronic pain: Secondary | ICD-10-CM | POA: Diagnosis not present

## 2021-10-09 DIAGNOSIS — Z8249 Family history of ischemic heart disease and other diseases of the circulatory system: Secondary | ICD-10-CM | POA: Diagnosis not present

## 2021-10-09 DIAGNOSIS — M9943 Connective tissue stenosis of neural canal of lumbar region: Secondary | ICD-10-CM | POA: Diagnosis not present

## 2021-10-09 DIAGNOSIS — R519 Headache, unspecified: Secondary | ICD-10-CM | POA: Diagnosis not present

## 2021-10-09 DIAGNOSIS — M47897 Other spondylosis, lumbosacral region: Secondary | ICD-10-CM | POA: Diagnosis not present

## 2021-10-09 DIAGNOSIS — R5382 Chronic fatigue, unspecified: Secondary | ICD-10-CM | POA: Diagnosis not present

## 2021-10-09 DIAGNOSIS — M25559 Pain in unspecified hip: Secondary | ICD-10-CM | POA: Diagnosis not present

## 2021-10-09 DIAGNOSIS — E1142 Type 2 diabetes mellitus with diabetic polyneuropathy: Secondary | ICD-10-CM | POA: Diagnosis not present

## 2021-11-03 DIAGNOSIS — E1165 Type 2 diabetes mellitus with hyperglycemia: Secondary | ICD-10-CM | POA: Diagnosis not present

## 2021-11-27 DIAGNOSIS — I10 Essential (primary) hypertension: Secondary | ICD-10-CM | POA: Diagnosis not present

## 2021-11-27 DIAGNOSIS — Z125 Encounter for screening for malignant neoplasm of prostate: Secondary | ICD-10-CM | POA: Diagnosis not present

## 2021-11-27 DIAGNOSIS — R1032 Left lower quadrant pain: Secondary | ICD-10-CM | POA: Diagnosis not present

## 2021-11-28 DIAGNOSIS — E7800 Pure hypercholesterolemia, unspecified: Secondary | ICD-10-CM | POA: Diagnosis not present

## 2021-11-28 DIAGNOSIS — I10 Essential (primary) hypertension: Secondary | ICD-10-CM | POA: Diagnosis not present

## 2021-11-28 DIAGNOSIS — J302 Other seasonal allergic rhinitis: Secondary | ICD-10-CM | POA: Diagnosis not present

## 2021-11-28 DIAGNOSIS — R1111 Vomiting without nausea: Secondary | ICD-10-CM | POA: Diagnosis not present

## 2021-11-28 DIAGNOSIS — Z0000 Encounter for general adult medical examination without abnormal findings: Secondary | ICD-10-CM | POA: Diagnosis not present

## 2021-11-28 DIAGNOSIS — R1310 Dysphagia, unspecified: Secondary | ICD-10-CM | POA: Diagnosis not present

## 2021-11-28 DIAGNOSIS — I2510 Atherosclerotic heart disease of native coronary artery without angina pectoris: Secondary | ICD-10-CM | POA: Diagnosis not present

## 2021-11-28 DIAGNOSIS — E1165 Type 2 diabetes mellitus with hyperglycemia: Secondary | ICD-10-CM | POA: Diagnosis not present

## 2021-12-03 DIAGNOSIS — E1165 Type 2 diabetes mellitus with hyperglycemia: Secondary | ICD-10-CM | POA: Diagnosis not present

## 2021-12-04 ENCOUNTER — Other Ambulatory Visit: Payer: Self-pay

## 2021-12-04 ENCOUNTER — Emergency Department
Admission: EM | Admit: 2021-12-04 | Discharge: 2021-12-04 | Disposition: A | Discharge status: Home / Self Care | Payer: PPO | Attending: Emergency Medicine | Admitting: Emergency Medicine

## 2021-12-04 ENCOUNTER — Encounter: Payer: Self-pay | Admitting: Emergency Medicine

## 2021-12-04 DIAGNOSIS — R5381 Other malaise: Secondary | ICD-10-CM | POA: Insufficient documentation

## 2021-12-04 DIAGNOSIS — S21219A Laceration without foreign body of unspecified back wall of thorax without penetration into thoracic cavity, initial encounter: Secondary | ICD-10-CM | POA: Insufficient documentation

## 2021-12-04 DIAGNOSIS — I2510 Atherosclerotic heart disease of native coronary artery without angina pectoris: Secondary | ICD-10-CM | POA: Diagnosis not present

## 2021-12-04 DIAGNOSIS — I10 Essential (primary) hypertension: Secondary | ICD-10-CM | POA: Diagnosis not present

## 2021-12-04 DIAGNOSIS — E1165 Type 2 diabetes mellitus with hyperglycemia: Secondary | ICD-10-CM | POA: Insufficient documentation

## 2021-12-04 DIAGNOSIS — R519 Headache, unspecified: Secondary | ICD-10-CM | POA: Diagnosis not present

## 2021-12-04 DIAGNOSIS — X58XXXA Exposure to other specified factors, initial encounter: Secondary | ICD-10-CM | POA: Insufficient documentation

## 2021-12-04 DIAGNOSIS — J45909 Unspecified asthma, uncomplicated: Secondary | ICD-10-CM | POA: Insufficient documentation

## 2021-12-04 DIAGNOSIS — Z20822 Contact with and (suspected) exposure to covid-19: Secondary | ICD-10-CM | POA: Insufficient documentation

## 2021-12-04 DIAGNOSIS — R21 Rash and other nonspecific skin eruption: Secondary | ICD-10-CM | POA: Insufficient documentation

## 2021-12-04 DIAGNOSIS — S299XXA Unspecified injury of thorax, initial encounter: Secondary | ICD-10-CM | POA: Diagnosis present

## 2021-12-04 LAB — CBC WITH DIFFERENTIAL/PLATELET
Abs Immature Granulocytes: 0.02 10*3/uL (ref 0.00–0.07)
Basophils Absolute: 0.1 10*3/uL (ref 0.0–0.1)
Basophils Relative: 1 %
Eosinophils Absolute: 0.3 10*3/uL (ref 0.0–0.5)
Eosinophils Relative: 3 %
HCT: 40.1 % (ref 39.0–52.0)
Hemoglobin: 14 g/dL (ref 13.0–17.0)
Immature Granulocytes: 0 %
Lymphocytes Relative: 28 %
Lymphs Abs: 2.5 10*3/uL (ref 0.7–4.0)
MCH: 28.6 pg (ref 26.0–34.0)
MCHC: 34.9 g/dL (ref 30.0–36.0)
MCV: 81.8 fL (ref 80.0–100.0)
Monocytes Absolute: 0.4 10*3/uL (ref 0.1–1.0)
Monocytes Relative: 5 %
Neutro Abs: 5.6 10*3/uL (ref 1.7–7.7)
Neutrophils Relative %: 63 %
Platelets: 230 10*3/uL (ref 150–400)
RBC: 4.9 MIL/uL (ref 4.22–5.81)
RDW: 12.7 % (ref 11.5–15.5)
WBC: 8.8 10*3/uL (ref 4.0–10.5)
nRBC: 0 % (ref 0.0–0.2)

## 2021-12-04 LAB — RESP PANEL BY RT-PCR (FLU A&B, COVID) ARPGX2
Influenza A by PCR: NEGATIVE
Influenza B by PCR: NEGATIVE
SARS Coronavirus 2 by RT PCR: NEGATIVE

## 2021-12-04 LAB — BASIC METABOLIC PANEL
Anion gap: 9 (ref 5–15)
BUN: 11 mg/dL (ref 6–20)
CO2: 22 mmol/L (ref 22–32)
Calcium: 8.6 mg/dL — ABNORMAL LOW (ref 8.9–10.3)
Chloride: 105 mmol/L (ref 98–111)
Creatinine, Ser: 0.68 mg/dL (ref 0.61–1.24)
GFR, Estimated: >60 mL/min (ref >60)
Glucose, Bld: 264 mg/dL — ABNORMAL HIGH (ref 70–99)
Potassium: 3.7 mmol/L (ref 3.5–5.1)
Sodium: 136 mmol/L (ref 135–145)

## 2021-12-04 MED ORDER — ACETAMINOPHEN 500 MG PO TABS
1000.0000 mg | ORAL_TABLET | Freq: Once | ORAL | Status: AC
  Administered 2021-12-04: 1000 mg via ORAL
  Filled 2021-12-04: qty 2

## 2021-12-04 MED ORDER — CEPHALEXIN 500 MG PO CAPS: 1000.0000 mg | ORAL_CAPSULE | Freq: Two times a day (BID) | ORAL | 0 refills | Status: AC

## 2021-12-04 NOTE — Discharge Instructions (Addendum)
-  Take all of antibiotics as prescribed.  You may additionally take Tylenol/ibuprofen as needed for pain.  -Follow-up with your primary care provider as needed.  -Return to the emergency department anytime if you begin to experience any new or worsening symptoms.

## 2021-12-04 NOTE — ED Triage Notes (Signed)
Patient to ED via POV for rash on buttocks and legs. Patient was swimming Sunday at local pool and has been having itchy skin and overall not feeling well.

## 2021-12-04 NOTE — ED Provider Triage Note (Signed)
Emergency Medicine Provider Triage Evaluation Note  Gerald Ellis, a 56 y.o. male  was evaluated in triage.  Pt complains of pruritic rash, after swimming in a public pool on Sunday. He also complains of generalized malaise and poor energy.    Review of Systems  Positive: Malaise, rash Negative: FCS  Physical Exam  There were no vitals taken for this visit. Gen:   Awake, no distress   Resp:  Normal effort  MSK:   Moves extremities without difficulty  Other:  No maculopapular rash noted. A few scattered papules in the areas of concern  Medical Decision Making  Medically screening exam initiated at 4:22 PM.  Appropriate orders placed.  [name] was informed that the remainder of the evaluation will be completed by another provider, this initial triage assessment does not replace that evaluation, and the importance of remaining in the ED until their evaluation is complete.  Patient to the ED for evaluation of pruritic rash as well as generalized malaise and poor energy after swimming in a pool on Sunday.   Melvenia Needles, PA-C 12/04/21 1638

## 2021-12-04 NOTE — ED Provider Notes (Signed)
[facility] Provider Note    Event Date/Time   First MD Initiated Contact with Patient 12/04/21 1707     (approximate)   History   Chief Complaint Rash   HPI [name] is a 56 y.o. male, history of asthma, depression, hypertension, PAD, diabetes type 2, chronic venous insufficiency, CAD, presents emergency department for evaluation of rash and general malaise.  Reports vomiting in a local pool approximately 2 days prior.  Since then, he has had itchy skin and development of small papules on his right lower extremity, as well as itchiness along his buttocks.  Additionally states that he overall feels sleepy and unwell.  Reports a mild headache.  He expresses concern that he may have picked up a life-threatening virus/bacteria from the water, but is unsure.  Denies fever/chills, chest pain, shortness of breath, abdominal pain, flank pain, nausea/vomiting, diarrhea, urinary symptoms, dizziness/lightheadedness, or numb/tingling upper or lower extremities  History Limitations: No limitations.        Physical Exam  Triage Vital Signs: ED Triage Vitals  Enc Vitals Group     BP 12/04/21 1623 (!) 185/98     Pulse Rate 12/04/21 1623 87     Resp 12/04/21 1623 17     Temp 12/04/21 1623 98.1 F (36.7 C)     Temp Source 12/04/21 1623 Oral     SpO2 12/04/21 1623 96 %     Weight 12/04/21 1629 224 lb (101.6 kg)     Height 12/04/21 1629 '5\' 9"'$  (1.753 m)     Head Circumference --      Peak Flow --      Pain Score 12/04/21 1629 6     Pain Loc --      Pain Edu? --      Excl. in [place]? --     Most recent vital signs: Vitals:   12/04/21 1623  BP: (!) 185/98  Pulse: 87  Resp: 17  Temp: 98.1 F (36.7 C)  SpO2: 96%    General: Awake, appears anxious. Eyes: PERRL. Conjunctivae normal.  CV: Good peripheral perfusion.  Resp: Normal effort.  Abd: Soft, non-tender. No distention.  Neuro: At baseline. No gross neurological deficits.   Focused Exam: Small  papules (approximately 3-4) present along the right lower extremity with some erythema and flaking.  No significant warmth or tenderness.  PMS intact distally.  No active bleeding or discharge.  Small cuts/abrasions present along the buttocks above the gluteal cleft.  No rash or lesions present.  No active bleeding or discharge.  Physical Exam    ED Results / Procedures / Treatments  Labs (all labs ordered are listed, but only abnormal results are displayed) Labs Reviewed  BASIC METABOLIC PANEL - Abnormal; Notable for the following components:      Result Value   Glucose, Bld 264 (*)    Calcium 8.6 (*)    All other components within normal limits  RESP PANEL BY RT-PCR (FLU A&B, COVID) ARPGX2  CBC WITH DIFFERENTIAL/PLATELET     EKG N/A.   RADIOLOGY  ED Provider Interpretation: N/A.  No results found.  PROCEDURES:  Critical Care performed: N/A.  Procedures    MEDICATIONS ORDERED IN ED: Medications  acetaminophen (TYLENOL) tablet 1,000 mg (1,000 mg Oral Given 12/04/21 1748)     IMPRESSION / MDM / ASSESSMENT AND PLAN / ED COURSE  I reviewed the triage vital signs and the nursing notes.  Differential diagnosis includes, but is not limited to, cellulitis, contact dermatitis, allergic dermatitis, allergic reaction, fungal infection  ED Course Patient appears well, NAD though does appear slightly anxious.  CBC shows no leukocytosis or anemia.  BMP shows no significant electrolyte abnormalities or evidence of AKI, though does show elevated glucose, consistent with history of diabetes.  He states that he has been taking all his medications as prescribed  Assessment/Plan Patient presents with generalized malaise and small papules along the right lower extremity.  Endorses itchiness along his buttocks as well, though there is no apparent rash.  Lesions do not appear grossly cellulitic, though patient does have symptoms of generalized malaise  and a history of diabetes.  We will go ahead and cover him for possible bacterial skin infection with prescription for cephalexin.  However, very low suspicion for any serious or life-threatening pathology.  His lab work-up is reassuring.  Advised him to follow-up with his primary care provider within the next few days.  Patient's presentation is most consistent with acute complicated illness / injury requiring diagnostic workup.   Considered admission for this patient, but given his stable presentation and unremarkable work-up, he is unlikely to benefit from admission.  Provided the patient with anticipatory guidance, return precautions, and educational material. Encouraged the patient to return to the emergency department at any time if they begin to experience any new or worsening symptoms. Patient expressed understanding and agreed with the plan.       FINAL CLINICAL IMPRESSION(S) / ED DIAGNOSES   Final diagnoses:  Rash and nonspecific skin eruption     Rx / DC Orders   ED Discharge Orders          Ordered    cephALEXin (KEFLEX) 500 MG capsule  2 times daily        12/04/21 1832             Note:  This document was prepared using Dragon voice recognition software and may include unintentional dictation errors.   Teodoro Spray, [place] 12/04/21 Junious Dresser    Arta Silence, MD 12/04/21 2140

## 2021-12-11 DIAGNOSIS — E1143 Type 2 diabetes mellitus with diabetic autonomic (poly)neuropathy: Secondary | ICD-10-CM | POA: Diagnosis not present

## 2021-12-11 DIAGNOSIS — Z794 Long term (current) use of insulin: Secondary | ICD-10-CM | POA: Diagnosis not present

## 2021-12-11 DIAGNOSIS — I10 Essential (primary) hypertension: Secondary | ICD-10-CM | POA: Diagnosis not present

## 2021-12-18 DIAGNOSIS — E1159 Type 2 diabetes mellitus with other circulatory complications: Secondary | ICD-10-CM | POA: Diagnosis not present

## 2021-12-18 DIAGNOSIS — E1165 Type 2 diabetes mellitus with hyperglycemia: Secondary | ICD-10-CM | POA: Diagnosis not present

## 2021-12-18 DIAGNOSIS — E113299 Type 2 diabetes mellitus with mild nonproliferative diabetic retinopathy without macular edema, unspecified eye: Secondary | ICD-10-CM | POA: Diagnosis not present

## 2021-12-18 DIAGNOSIS — E785 Hyperlipidemia, unspecified: Secondary | ICD-10-CM | POA: Diagnosis not present

## 2021-12-18 DIAGNOSIS — Z794 Long term (current) use of insulin: Secondary | ICD-10-CM | POA: Diagnosis not present

## 2021-12-18 DIAGNOSIS — E1143 Type 2 diabetes mellitus with diabetic autonomic (poly)neuropathy: Secondary | ICD-10-CM | POA: Diagnosis not present

## 2021-12-18 DIAGNOSIS — E781 Pure hyperglyceridemia: Secondary | ICD-10-CM | POA: Diagnosis not present

## 2021-12-18 DIAGNOSIS — I10 Essential (primary) hypertension: Secondary | ICD-10-CM | POA: Diagnosis not present

## 2021-12-18 DIAGNOSIS — E1129 Type 2 diabetes mellitus with other diabetic kidney complication: Secondary | ICD-10-CM | POA: Diagnosis not present

## 2021-12-18 DIAGNOSIS — E1169 Type 2 diabetes mellitus with other specified complication: Secondary | ICD-10-CM | POA: Diagnosis not present

## 2021-12-18 DIAGNOSIS — R809 Proteinuria, unspecified: Secondary | ICD-10-CM | POA: Diagnosis not present

## 2022-01-03 DIAGNOSIS — E1165 Type 2 diabetes mellitus with hyperglycemia: Secondary | ICD-10-CM | POA: Diagnosis not present

## 2022-01-10 DIAGNOSIS — R1084 Generalized abdominal pain: Secondary | ICD-10-CM | POA: Diagnosis not present

## 2022-01-10 DIAGNOSIS — U071 COVID-19: Secondary | ICD-10-CM | POA: Diagnosis not present

## 2022-01-10 DIAGNOSIS — L02212 Cutaneous abscess of back [any part, except buttock]: Secondary | ICD-10-CM | POA: Diagnosis not present

## 2022-01-10 DIAGNOSIS — I10 Essential (primary) hypertension: Secondary | ICD-10-CM | POA: Diagnosis not present

## 2022-01-10 DIAGNOSIS — Z955 Presence of coronary angioplasty implant and graft: Secondary | ICD-10-CM | POA: Diagnosis not present

## 2022-01-10 DIAGNOSIS — R509 Fever, unspecified: Secondary | ICD-10-CM | POA: Diagnosis not present

## 2022-01-10 DIAGNOSIS — R112 Nausea with vomiting, unspecified: Secondary | ICD-10-CM | POA: Diagnosis not present

## 2022-01-10 DIAGNOSIS — I2510 Atherosclerotic heart disease of native coronary artery without angina pectoris: Secondary | ICD-10-CM | POA: Diagnosis not present

## 2022-01-10 DIAGNOSIS — E1165 Type 2 diabetes mellitus with hyperglycemia: Secondary | ICD-10-CM | POA: Diagnosis not present

## 2022-01-31 DIAGNOSIS — R059 Cough, unspecified: Secondary | ICD-10-CM | POA: Diagnosis not present

## 2022-01-31 DIAGNOSIS — J029 Acute pharyngitis, unspecified: Secondary | ICD-10-CM | POA: Diagnosis not present

## 2022-02-03 DIAGNOSIS — E1165 Type 2 diabetes mellitus with hyperglycemia: Secondary | ICD-10-CM | POA: Diagnosis not present

## 2022-02-04 DIAGNOSIS — G8929 Other chronic pain: Secondary | ICD-10-CM | POA: Diagnosis not present

## 2022-02-04 DIAGNOSIS — M792 Neuralgia and neuritis, unspecified: Secondary | ICD-10-CM | POA: Diagnosis not present

## 2022-02-04 DIAGNOSIS — M9973 Connective tissue and disc stenosis of intervertebral foramina of lumbar region: Secondary | ICD-10-CM | POA: Diagnosis not present

## 2022-02-04 DIAGNOSIS — E1142 Type 2 diabetes mellitus with diabetic polyneuropathy: Secondary | ICD-10-CM | POA: Diagnosis not present

## 2022-02-04 DIAGNOSIS — M47897 Other spondylosis, lumbosacral region: Secondary | ICD-10-CM | POA: Diagnosis not present

## 2022-02-04 DIAGNOSIS — M4807 Spinal stenosis, lumbosacral region: Secondary | ICD-10-CM | POA: Diagnosis not present

## 2022-02-04 DIAGNOSIS — R5382 Chronic fatigue, unspecified: Secondary | ICD-10-CM | POA: Diagnosis not present

## 2022-02-04 DIAGNOSIS — M9943 Connective tissue stenosis of neural canal of lumbar region: Secondary | ICD-10-CM | POA: Diagnosis not present

## 2022-02-04 DIAGNOSIS — Z833 Family history of diabetes mellitus: Secondary | ICD-10-CM | POA: Diagnosis not present

## 2022-02-04 DIAGNOSIS — M48062 Spinal stenosis, lumbar region with neurogenic claudication: Secondary | ICD-10-CM | POA: Diagnosis not present

## 2022-02-04 DIAGNOSIS — R519 Headache, unspecified: Secondary | ICD-10-CM | POA: Diagnosis not present

## 2022-02-04 DIAGNOSIS — Z8249 Family history of ischemic heart disease and other diseases of the circulatory system: Secondary | ICD-10-CM | POA: Diagnosis not present

## 2022-02-28 DIAGNOSIS — D369 Benign neoplasm, unspecified site: Secondary | ICD-10-CM | POA: Diagnosis not present

## 2022-02-28 DIAGNOSIS — K3184 Gastroparesis: Secondary | ICD-10-CM | POA: Diagnosis not present

## 2022-02-28 DIAGNOSIS — K219 Gastro-esophageal reflux disease without esophagitis: Secondary | ICD-10-CM | POA: Diagnosis not present

## 2022-02-28 DIAGNOSIS — E1143 Type 2 diabetes mellitus with diabetic autonomic (poly)neuropathy: Secondary | ICD-10-CM | POA: Diagnosis not present

## 2022-03-05 DIAGNOSIS — E1165 Type 2 diabetes mellitus with hyperglycemia: Secondary | ICD-10-CM | POA: Diagnosis not present

## 2022-03-26 ENCOUNTER — Inpatient Hospital Stay: Payer: HMO

## 2022-03-26 ENCOUNTER — Encounter: Payer: Self-pay | Admitting: Licensed Clinical Social Worker

## 2022-03-26 ENCOUNTER — Inpatient Hospital Stay: Payer: HMO | Attending: Oncology | Admitting: Licensed Clinical Social Worker

## 2022-03-26 DIAGNOSIS — E1165 Type 2 diabetes mellitus with hyperglycemia: Secondary | ICD-10-CM | POA: Diagnosis not present

## 2022-03-26 DIAGNOSIS — E113299 Type 2 diabetes mellitus with mild nonproliferative diabetic retinopathy without macular edema, unspecified eye: Secondary | ICD-10-CM | POA: Diagnosis not present

## 2022-03-26 DIAGNOSIS — Z794 Long term (current) use of insulin: Secondary | ICD-10-CM | POA: Diagnosis not present

## 2022-03-26 DIAGNOSIS — E1159 Type 2 diabetes mellitus with other circulatory complications: Secondary | ICD-10-CM | POA: Diagnosis not present

## 2022-03-26 DIAGNOSIS — Z801 Family history of malignant neoplasm of trachea, bronchus and lung: Secondary | ICD-10-CM | POA: Diagnosis not present

## 2022-03-26 DIAGNOSIS — E1169 Type 2 diabetes mellitus with other specified complication: Secondary | ICD-10-CM | POA: Diagnosis not present

## 2022-03-26 DIAGNOSIS — Z8042 Family history of malignant neoplasm of prostate: Secondary | ICD-10-CM

## 2022-03-26 DIAGNOSIS — E1129 Type 2 diabetes mellitus with other diabetic kidney complication: Secondary | ICD-10-CM | POA: Diagnosis not present

## 2022-03-26 DIAGNOSIS — E1143 Type 2 diabetes mellitus with diabetic autonomic (poly)neuropathy: Secondary | ICD-10-CM | POA: Diagnosis not present

## 2022-03-26 DIAGNOSIS — Z800 Family history of malignant neoplasm of digestive organs: Secondary | ICD-10-CM | POA: Diagnosis not present

## 2022-03-26 DIAGNOSIS — I10 Essential (primary) hypertension: Secondary | ICD-10-CM | POA: Diagnosis not present

## 2022-03-26 DIAGNOSIS — Z86010 Personal history of colonic polyps: Secondary | ICD-10-CM | POA: Diagnosis not present

## 2022-03-26 DIAGNOSIS — E785 Hyperlipidemia, unspecified: Secondary | ICD-10-CM | POA: Diagnosis not present

## 2022-03-26 DIAGNOSIS — R809 Proteinuria, unspecified: Secondary | ICD-10-CM | POA: Diagnosis not present

## 2022-03-26 DIAGNOSIS — E781 Pure hyperglyceridemia: Secondary | ICD-10-CM | POA: Diagnosis not present

## 2022-03-26 NOTE — Progress Notes (Signed)
REFERRING PROVIDER: Ok Edwards, NP [place],  [place] [address]  PRIMARY PROVIDER:  Maryland Pink, MD  PRIMARY REASON FOR VISIT:  1. Personal history of colonic polyps   2. Family history of colon cancer   3. Family history of prostate cancer   4. Family history of lung cancer      HISTORY OF PRESENT ILLNESS:   Mr. Nouri, a 56 y.o. male, was seen for a [facility] cancer genetics consultation at the request of Stephens November, NP due to a personal history of colon polyps and family history of cancer.  Mr. Manville presents to clinic today to discuss the possibility of a hereditary predisposition to cancer, genetic testing, and to further clarify his future cancer risks, as well as potential cancer risks for family members.    CANCER HISTORY:   Mr. Rybicki is a 56 y.o. male with no personal history of cancer.    RISK FACTORS:  Mr. Antenucci has had 3 colonoscopies: 2017: 6 polyps, tubular adenomas 2019: 5 polyps, tubular adenomas 2022: 2 polyps, adenomas  Mr. Mathison reports that he has regular skin exams through a dermatologist.   Past Medical History:  Diagnosis Date   Benign enlargement of prostate    Bronchitis    Childhood asthma    Diabetes mellitus without complication ([place])    Environmental allergies    Erectile dysfunction    Headache    Hemorrhoids    Hypercholesteremia    Hypertension    Hypogonadism in male    Lumbago    Over weight     Past Surgical History:  Procedure Laterality Date   BACK SURGERY  1992   CHOLECYSTECTOMY     COLONOSCOPY WITH PROPOFOL N/A 08/07/2015   Procedure: COLONOSCOPY WITH PROPOFOL;  Surgeon: Lollie Sails, MD;  Location: [facility] ENDOSCOPY;  Service: Endoscopy;  Laterality: N/A;   COLONOSCOPY WITH PROPOFOL N/A 08/08/2015   Procedure: COLONOSCOPY WITH PROPOFOL;  Surgeon: Lollie Sails, MD;  Location: [facility] ENDOSCOPY;  Service: Endoscopy;  Laterality: N/A;    COLONOSCOPY WITH PROPOFOL N/A 09/16/2017   Procedure: COLONOSCOPY WITH PROPOFOL;  Surgeon: Lollie Sails, MD;  Location: [facility] ENDOSCOPY;  Service: Endoscopy;  Laterality: N/A;   COLONOSCOPY WITH PROPOFOL N/A 12/20/2020   Procedure: COLONOSCOPY WITH PROPOFOL;  Surgeon: Joseff Bellow, MD;  Location: [facility] ENDOSCOPY;  Service: Endoscopy;  Laterality: N/A;  IDDM   CORONARY STENT INTERVENTION N/A 04/20/2019   Procedure: CORONARY STENT INTERVENTION;  Surgeon: Isaias Cowman, MD;  Location: [facility] LAB;  Service: Cardiovascular;  Laterality: N/A;   ESOPHAGOGASTRODUODENOSCOPY (EGD) WITH PROPOFOL N/A 08/07/2015   Procedure: ESOPHAGOGASTRODUODENOSCOPY (EGD) WITH PROPOFOL;  Surgeon: Lollie Sails, MD;  Location: [facility] ENDOSCOPY;  Service: Endoscopy;  Laterality: N/A;   ESOPHAGOGASTRODUODENOSCOPY (EGD) WITH PROPOFOL N/A 09/16/2017   Procedure: ESOPHAGOGASTRODUODENOSCOPY (EGD) WITH PROPOFOL;  Surgeon: Lollie Sails, MD;  Location: [facility] ENDOSCOPY;  Service: Endoscopy;  Laterality: N/A;   ESOPHAGOGASTRODUODENOSCOPY (EGD) WITH PROPOFOL N/A 12/20/2020   Procedure: ESOPHAGOGASTRODUODENOSCOPY (EGD) WITH PROPOFOL;  Surgeon: Ventura Bellow, MD;  Location: [facility] ENDOSCOPY;  Service: Endoscopy;  Laterality: N/A;   LEFT HEART CATH AND CORONARY ANGIOGRAPHY Left 04/20/2019   Procedure: LEFT HEART CATH AND CORONARY ANGIOGRAPHY;  Surgeon: Isaias Cowman, MD;  Location: [facility] LAB;  Service: Cardiovascular;  Laterality: Left;    FAMILY HISTORY:  We obtained a detailed, 4-generation family history.  Significant diagnoses are listed below: Family History  Problem Relation Age of  Onset   Heart disease Mother        CABG   Lung cancer Mother        Disseminated   Hypertension Mother    Hyperlipidemia Mother    Diabetes Father        mother   Colon cancer Father 27   Cancer Maternal Aunt        x3 aunts, unk types of cancer   Colon cancer Maternal Uncle        x3 uncles    Prostate cancer Maternal Uncle        x4 uncles   Cancer Paternal Uncle        prostate vs colon   Alzheimer's disease Other    Kidney disease Neg Hx    Mr. Osmon has 4 full sisters and 1 maternal half sister, none have had cancer or colon polyps that he is aware of.   Mr. Cruces mother passed at 70 or 69 of stage IV lung cancer. Patient had 7 maternal uncles and 4 maternal aunts. Approximately 3 uncles had colon cancer, unknown ages, and 4 uncles had prostate cancer. All 4 aunts had cancer, he is unsure of the type for 3 of them but one did have a brain cancer.   Mr. Clearman father was recently diagnosed with colon cancer at 72. A paternal uncle had cancer, either prostate or colon cancer. Paternal grandmother had a brain tumor in her 21s and passed in her 50s.   Mr. Gladu is unaware of previous family history of genetic testing for hereditary cancer risks. There is no reported Ashkenazi Jewish ancestry. There is no known consanguinity.    GENETIC COUNSELING ASSESSMENT: Mr. Zink is a 56 y.o. male with a personal history of >10 colon polyps and a family history of colon/prostate cancer which is somewhat suggestive of a hereditary cancer syndrome and predisposition to cancer. We, therefore, discussed and recommended the following at today's visit.   DISCUSSION:  We discussed that polyps in general are common, however, most people have fewer than 5 lifetime polyps.  When an individual has 10 or more polyps we become concerned about an underlying polyposis syndrome.  The most common hereditary polyposis syndromes are caused by problems in the APC and MUTYH genes. We discussed that approximately 10% of cancer is hereditary. Most cases of hereditary colon cancer are associated with Lynch syndrome genes, although there are other genes associated with hereditary cancer as well. Cancers and risks are gene specific. We discussed that testing is beneficial for several reasons including knowing  about cancer risks, identifying potential screening and risk-reduction options that may be appropriate, and to understand if other family members could be at risk for cancer and allow them to undergo genetic testing.   We reviewed the characteristics, features and inheritance patterns of hereditary cancer syndromes. We also discussed genetic testing, including the appropriate family members to test, the process of testing, insurance coverage and turn-around-time for results. We discussed the implications of a negative, positive and/or variant of uncertain significant result. We recommended Mr. Tugwell pursue genetic testing for the [facility] Multi-Cancer+RNA panel gene panel.   Based on Mr. Mickley's personal history of colon polyps and family history of cancer, he meets medical criteria for genetic testing. Despite that he meets criteria, he may still have an out of pocket cost. We discussed that if his out of pocket cost for testing is over $100, the laboratory will call and confirm whether he wants to proceed with testing.  If the out of pocket cost of testing is less than $100 he will be billed by the genetic testing laboratory.   PLAN: After considering the risks, benefits, and limitations, Mr. Fikes provided informed consent to pursue genetic testing and the blood sample was sent to [facility] for analysis of the Multi-Cancer+RNA panel. Results should be available within approximately 2-3 weeks' time, at which point they will be disclosed by telephone to Mr. Touhey, as will any additional recommendations warranted by these results. Mr. Cafaro will receive a summary of his genetic counseling visit and a copy of his results once available. This information will also be available in Epic.   Mr. Riddle questions were answered to his satisfaction today. Our contact information was provided should additional questions or concerns arise. Thank you for the referral and allowing [place] to share in  the care of your patient.   Faith Rogue, MS, [facility] Genetic Counselor [email or web address] Phone: [phone]  The patient was seen for a total of 25 minutes in face-to-face genetic counseling. Patient's wife was also present. Dr. Grayland Ormond was available for discussion regarding this case.   _______________________________________________________________________ For Office Staff:  Number of people involved in session: 2 Was an Intern/ student involved with case: no

## 2022-03-28 DIAGNOSIS — R0602 Shortness of breath: Secondary | ICD-10-CM | POA: Diagnosis not present

## 2022-03-28 DIAGNOSIS — J4540 Moderate persistent asthma, uncomplicated: Secondary | ICD-10-CM | POA: Diagnosis not present

## 2022-03-28 DIAGNOSIS — R9431 Abnormal electrocardiogram [ECG] [EKG]: Secondary | ICD-10-CM | POA: Diagnosis not present

## 2022-03-28 DIAGNOSIS — Z955 Presence of coronary angioplasty implant and graft: Secondary | ICD-10-CM | POA: Diagnosis not present

## 2022-03-28 DIAGNOSIS — R0789 Other chest pain: Secondary | ICD-10-CM | POA: Diagnosis not present

## 2022-03-28 DIAGNOSIS — I10 Essential (primary) hypertension: Secondary | ICD-10-CM | POA: Diagnosis not present

## 2022-03-28 DIAGNOSIS — I2510 Atherosclerotic heart disease of native coronary artery without angina pectoris: Secondary | ICD-10-CM | POA: Diagnosis not present

## 2022-03-28 DIAGNOSIS — R079 Chest pain, unspecified: Secondary | ICD-10-CM | POA: Diagnosis not present

## 2022-03-28 DIAGNOSIS — Z794 Long term (current) use of insulin: Secondary | ICD-10-CM | POA: Diagnosis not present

## 2022-03-28 DIAGNOSIS — E119 Type 2 diabetes mellitus without complications: Secondary | ICD-10-CM | POA: Diagnosis not present

## 2022-03-28 DIAGNOSIS — E7800 Pure hypercholesterolemia, unspecified: Secondary | ICD-10-CM | POA: Diagnosis not present

## 2022-03-28 DIAGNOSIS — Z23 Encounter for immunization: Secondary | ICD-10-CM | POA: Diagnosis not present

## 2022-04-01 DIAGNOSIS — M62830 Muscle spasm of back: Secondary | ICD-10-CM | POA: Diagnosis not present

## 2022-04-01 DIAGNOSIS — M1990 Unspecified osteoarthritis, unspecified site: Secondary | ICD-10-CM | POA: Diagnosis not present

## 2022-04-01 DIAGNOSIS — G8929 Other chronic pain: Secondary | ICD-10-CM | POA: Diagnosis not present

## 2022-04-01 DIAGNOSIS — R202 Paresthesia of skin: Secondary | ICD-10-CM | POA: Diagnosis not present

## 2022-04-01 DIAGNOSIS — M5136 Other intervertebral disc degeneration, lumbar region: Secondary | ICD-10-CM | POA: Diagnosis not present

## 2022-04-01 DIAGNOSIS — Z5181 Encounter for therapeutic drug level monitoring: Secondary | ICD-10-CM | POA: Diagnosis not present

## 2022-04-04 DIAGNOSIS — R062 Wheezing: Secondary | ICD-10-CM | POA: Diagnosis not present

## 2022-04-04 DIAGNOSIS — I2510 Atherosclerotic heart disease of native coronary artery without angina pectoris: Secondary | ICD-10-CM | POA: Diagnosis not present

## 2022-04-04 DIAGNOSIS — Z955 Presence of coronary angioplasty implant and graft: Secondary | ICD-10-CM | POA: Diagnosis not present

## 2022-04-04 DIAGNOSIS — R0789 Other chest pain: Secondary | ICD-10-CM | POA: Diagnosis not present

## 2022-04-05 DIAGNOSIS — E1165 Type 2 diabetes mellitus with hyperglycemia: Secondary | ICD-10-CM | POA: Diagnosis not present

## 2022-04-09 ENCOUNTER — Encounter: Payer: Self-pay | Admitting: Licensed Clinical Social Worker

## 2022-04-09 ENCOUNTER — Telehealth: Payer: Self-pay | Admitting: Licensed Clinical Social Worker

## 2022-04-09 ENCOUNTER — Ambulatory Visit: Payer: Self-pay | Admitting: Licensed Clinical Social Worker

## 2022-04-09 DIAGNOSIS — Z1379 Encounter for other screening for genetic and chromosomal anomalies: Secondary | ICD-10-CM

## 2022-04-09 NOTE — Progress Notes (Signed)
HPI:   [name] was previously seen in the [facility] clinic due to a personal history of colon polyps and family history of cancer and concerns regarding a hereditary predisposition to cancer. Please refer to our prior cancer genetics clinic note for more information regarding our discussion, assessment and recommendations, at the time. [name] recent genetic test results were disclosed to him, as were recommendations warranted by these results. These results and recommendations are discussed in more detail below.  CANCER HISTORY:  Oncology History   No history exists.    FAMILY HISTORY:  We obtained a detailed, 4-generation family history.  Significant diagnoses are listed below: Family History  Problem Relation Age of Onset   Heart disease Mother        CABG   Lung cancer Mother        Disseminated   Hypertension Mother    Hyperlipidemia Mother    Diabetes Father        mother   Colon cancer Father 78   Cancer Maternal Aunt        x3 aunts, unk types of cancer   Colon cancer Maternal Uncle        x3 uncles   Prostate cancer Maternal Uncle        x4 uncles   Cancer Paternal Uncle        prostate vs colon   Alzheimer's disease Other    Kidney disease Neg Hx     [name] has 4 full sisters and 1 maternal half sister, none have had cancer or colon polyps that he is aware of.    [name] mother passed at 85 or 15 of stage IV lung cancer. Patient had 7 maternal uncles and 4 maternal aunts. Approximately 3 uncles had colon cancer, unknown ages, and 4 uncles had prostate cancer. All 4 aunts had cancer, he is unsure of the type for 3 of them but one did have a brain cancer.    [name] father was recently diagnosed with colon cancer at 44. A paternal uncle had cancer, either prostate or colon cancer. Paternal grandmother had a brain tumor in her 79s and passed in her 84s.    [name] is unaware of previous family history of genetic testing  for hereditary cancer risks. There is no reported Ashkenazi Jewish ancestry. There is no known consanguinity.     GENETIC TEST RESULTS:  The [facility] Multi-Cancer+RNA Panel found no pathogenic mutations.   The Multi-Cancer + RNA Panel offered by [facility] includes sequencing and/or deletion/duplication analysis of the following 70 genes:  AIP*, ALK, APC*, ATM*, AXIN2*, BAP1*, BARD1*, BLM*, BMPR1A*, BRCA1*, BRCA2*, BRIP1*, CDC73*, CDH1*, CDK4, CDKN1B*, CDKN2A, CHEK2*, CTNNA1*, DICER1*, EPCAM, EGFR, FH*, FLCN*, GREM1, HOXB13, KIT, LZTR1, MAX*, MBD4, MEN1*, MET, MITF, MLH1*, MSH2*, MSH3*, MSH6*, MUTYH*, NF1*, NF2*, NTHL1*, PALB2*, PDGFRA, PMS2*, POLD1*, POLE*, POT1*, PRKAR1A*, PTCH1*, PTEN*, RAD51C*, RAD51D*, RB1*, RET, SDHA*, SDHAF2*, SDHB*, SDHC*, SDHD*, SMAD4*, SMARCA4*, SMARCB1*, SMARCE1*, STK11*, SUFU*, TMEM127*, TP53*, TSC1*, TSC2*, VHL*. RNA analysis is performed for * genes.   The test report has been scanned into EPIC and is located under the Molecular Pathology section of the Results Review tab.  A portion of the result report is included below for reference. Genetic testing reported out on 04/09/2022.    Even though a pathogenic variant was not identified, possible explanations for the cancer in the family may include: There may be no hereditary risk for cancer in the family. The cancers/polyps in [name]  and/or his family may be sporadic/familial or due to other genetic and environmental factors. There may be a gene mutation in one of these genes that current testing methods cannot detect but that chance is small. There could be another gene that has not yet been discovered, or that we have not yet tested, that is responsible for the cancer diagnoses in the family.  It is also possible there is a hereditary cause for the cancer in the family that [name] did not inherit.  Therefore, it is important to remain in touch with cancer genetics in the future so that we can continue to offer  [name] the most up to date genetic testing.   ADDITIONAL GENETIC TESTING:  We discussed with [name] that his genetic testing was fairly extensive.  If there are additional relevant genes identified to increase cancer risk that can be analyzed in the future, we would be happy to discuss and coordinate this testing at that time.    CANCER SCREENING RECOMMENDATIONS:  [name] test result is considered negative (normal).  This means that we have not identified a hereditary cause for his personal history of colon polyps and family history of cancer at this time.   An individual's cancer risk and medical management are not determined by genetic test results alone. Overall cancer risk assessment incorporates additional factors, including personal medical history, family history, and any available genetic information that may result in a personalized plan for cancer prevention and surveillance. Therefore, it is recommended he continue to follow the cancer management and screening guidelines provided by his primary healthcare provider.  This negative genetic test simply tells [place] that we cannot yet define why [name] has had  an increased number of colorectal polyps.  [name]'s medical management and screening should be based on the prospect that he  will likely form more colon polyps  and should, therefore, undergo more frequent colonoscopy screening at intervals determined by his GI providers.  We also recommended that [name] have an upper endoscopy periodically.  RECOMMENDATIONS FOR FAMILY MEMBERS:   Since he did not inherit a identifiable mutation in a cancer predisposition gene included on this panel, his children could not have inherited a known mutation from him in one of these genes. Individuals in this family might be at some increased risk of developing cancer, over the general population risk, due to the family history of cancer.  Individuals in the family should notify  their providers of the family history of cancer. We recommend women in this family have a yearly mammogram beginning at age 30, or 12 years younger than the earliest onset of cancer, an annual clinical breast exam, and perform monthly breast self-exams.  Family members should have colonoscopies by at age 82, or earlier, as recommended by their providers. Other members of the family may still carry a pathogenic variant in one of these genes that [name] did not inherit. Based on the family history, we recommend his maternal relatives have genetic counseling and testing. [name] will let us know if we can be of any assistance in coordinating genetic counseling and/or testing for this family member.     FOLLOW-UP:  Lastly, we discussed with [name] that cancer genetics is a rapidly advancing field and it is possible that new genetic tests will be appropriate for him and/or his family members in the future. We encouraged him to remain in contact with cancer genetics on an annual basis so we can  update his personal and family histories and let him know of advances in cancer genetics that may benefit this family.   Our contact number was provided. [name] questions were answered to his satisfaction, and he knows he is welcome to call us at anytime with additional questions or concerns.    Faith Rogue, MS, [facility] Genetic Counselor [email or web address] Phone: [phone]

## 2022-04-09 NOTE — Telephone Encounter (Signed)
I contacted Mr. Bejar to discuss his genetic testing results. No pathogenic variants were identified in the 84 genes analyzed. Detailed clinic note to follow.   The test report has been scanned into EPIC and is located under the Molecular Pathology section of the Results Review tab.  A portion of the result report is included below for reference.      Faith Rogue, MS, [facility] Genetic Counselor [email or web address] Phone: [phone]

## 2022-04-16 DIAGNOSIS — Z03818 Encounter for observation for suspected exposure to other biological agents ruled out: Secondary | ICD-10-CM | POA: Diagnosis not present

## 2022-04-16 DIAGNOSIS — E119 Type 2 diabetes mellitus without complications: Secondary | ICD-10-CM | POA: Diagnosis not present

## 2022-04-16 DIAGNOSIS — M542 Cervicalgia: Secondary | ICD-10-CM | POA: Diagnosis not present

## 2022-04-16 DIAGNOSIS — R0981 Nasal congestion: Secondary | ICD-10-CM | POA: Diagnosis not present

## 2022-04-16 DIAGNOSIS — R21 Rash and other nonspecific skin eruption: Secondary | ICD-10-CM | POA: Diagnosis not present

## 2022-04-16 DIAGNOSIS — R051 Acute cough: Secondary | ICD-10-CM | POA: Diagnosis not present

## 2022-04-16 DIAGNOSIS — R197 Diarrhea, unspecified: Secondary | ICD-10-CM | POA: Diagnosis not present

## 2022-04-17 DIAGNOSIS — E7800 Pure hypercholesterolemia, unspecified: Secondary | ICD-10-CM | POA: Diagnosis not present

## 2022-04-17 DIAGNOSIS — R0602 Shortness of breath: Secondary | ICD-10-CM | POA: Diagnosis not present

## 2022-04-17 DIAGNOSIS — E1165 Type 2 diabetes mellitus with hyperglycemia: Secondary | ICD-10-CM | POA: Diagnosis not present

## 2022-04-17 DIAGNOSIS — I10 Essential (primary) hypertension: Secondary | ICD-10-CM | POA: Diagnosis not present

## 2022-04-17 DIAGNOSIS — Z23 Encounter for immunization: Secondary | ICD-10-CM | POA: Diagnosis not present

## 2022-04-17 DIAGNOSIS — I700 Atherosclerosis of aorta: Secondary | ICD-10-CM | POA: Diagnosis not present

## 2022-04-17 DIAGNOSIS — Z955 Presence of coronary angioplasty implant and graft: Secondary | ICD-10-CM | POA: Diagnosis not present

## 2022-04-29 DIAGNOSIS — M5136 Other intervertebral disc degeneration, lumbar region: Secondary | ICD-10-CM | POA: Diagnosis not present

## 2022-04-29 DIAGNOSIS — M62830 Muscle spasm of back: Secondary | ICD-10-CM | POA: Diagnosis not present

## 2022-04-29 DIAGNOSIS — M1990 Unspecified osteoarthritis, unspecified site: Secondary | ICD-10-CM | POA: Diagnosis not present

## 2022-04-29 DIAGNOSIS — R202 Paresthesia of skin: Secondary | ICD-10-CM | POA: Diagnosis not present

## 2022-04-29 DIAGNOSIS — G8929 Other chronic pain: Secondary | ICD-10-CM | POA: Diagnosis not present

## 2022-05-05 DIAGNOSIS — E1165 Type 2 diabetes mellitus with hyperglycemia: Secondary | ICD-10-CM | POA: Diagnosis not present

## 2022-05-18 DIAGNOSIS — Z5181 Encounter for therapeutic drug level monitoring: Secondary | ICD-10-CM | POA: Diagnosis not present

## 2022-05-28 DIAGNOSIS — M62830 Muscle spasm of back: Secondary | ICD-10-CM | POA: Diagnosis not present

## 2022-05-28 DIAGNOSIS — G8929 Other chronic pain: Secondary | ICD-10-CM | POA: Diagnosis not present

## 2022-05-28 DIAGNOSIS — R202 Paresthesia of skin: Secondary | ICD-10-CM | POA: Diagnosis not present

## 2022-05-28 DIAGNOSIS — M5136 Other intervertebral disc degeneration, lumbar region: Secondary | ICD-10-CM | POA: Diagnosis not present

## 2022-05-28 DIAGNOSIS — M1990 Unspecified osteoarthritis, unspecified site: Secondary | ICD-10-CM | POA: Diagnosis not present

## 2022-06-05 DIAGNOSIS — E1165 Type 2 diabetes mellitus with hyperglycemia: Secondary | ICD-10-CM | POA: Diagnosis not present

## 2022-06-19 DIAGNOSIS — I161 Hypertensive emergency: Secondary | ICD-10-CM | POA: Diagnosis not present

## 2022-06-19 DIAGNOSIS — Z91199 Patient's noncompliance with other medical treatment and regimen due to unspecified reason: Secondary | ICD-10-CM | POA: Diagnosis not present

## 2022-06-19 DIAGNOSIS — L97512 Non-pressure chronic ulcer of other part of right foot with fat layer exposed: Secondary | ICD-10-CM | POA: Diagnosis not present

## 2022-06-19 DIAGNOSIS — G894 Chronic pain syndrome: Secondary | ICD-10-CM | POA: Diagnosis not present

## 2022-06-19 DIAGNOSIS — L03115 Cellulitis of right lower limb: Secondary | ICD-10-CM | POA: Diagnosis not present

## 2022-06-19 DIAGNOSIS — E1142 Type 2 diabetes mellitus with diabetic polyneuropathy: Secondary | ICD-10-CM | POA: Diagnosis not present

## 2022-06-19 DIAGNOSIS — M216X1 Other acquired deformities of right foot: Secondary | ICD-10-CM | POA: Diagnosis not present

## 2022-06-20 DIAGNOSIS — I10 Essential (primary) hypertension: Secondary | ICD-10-CM | POA: Diagnosis not present

## 2022-06-20 DIAGNOSIS — R8290 Unspecified abnormal findings in urine: Secondary | ICD-10-CM | POA: Diagnosis not present

## 2022-06-20 DIAGNOSIS — I700 Atherosclerosis of aorta: Secondary | ICD-10-CM | POA: Diagnosis not present

## 2022-06-20 DIAGNOSIS — E1165 Type 2 diabetes mellitus with hyperglycemia: Secondary | ICD-10-CM | POA: Diagnosis not present

## 2022-06-20 DIAGNOSIS — J4540 Moderate persistent asthma, uncomplicated: Secondary | ICD-10-CM | POA: Diagnosis not present

## 2022-06-20 DIAGNOSIS — E7800 Pure hypercholesterolemia, unspecified: Secondary | ICD-10-CM | POA: Diagnosis not present

## 2022-06-20 DIAGNOSIS — F3340 Major depressive disorder, recurrent, in remission, unspecified: Secondary | ICD-10-CM | POA: Diagnosis not present

## 2022-06-20 DIAGNOSIS — R052 Subacute cough: Secondary | ICD-10-CM | POA: Diagnosis not present

## 2022-06-20 DIAGNOSIS — I2510 Atherosclerotic heart disease of native coronary artery without angina pectoris: Secondary | ICD-10-CM | POA: Diagnosis not present

## 2022-06-25 DIAGNOSIS — M1990 Unspecified osteoarthritis, unspecified site: Secondary | ICD-10-CM | POA: Diagnosis not present

## 2022-06-25 DIAGNOSIS — M5136 Other intervertebral disc degeneration, lumbar region: Secondary | ICD-10-CM | POA: Diagnosis not present

## 2022-06-25 DIAGNOSIS — R202 Paresthesia of skin: Secondary | ICD-10-CM | POA: Diagnosis not present

## 2022-06-25 DIAGNOSIS — M62830 Muscle spasm of back: Secondary | ICD-10-CM | POA: Diagnosis not present

## 2022-06-25 DIAGNOSIS — G8929 Other chronic pain: Secondary | ICD-10-CM | POA: Diagnosis not present

## 2022-06-26 DIAGNOSIS — E118 Type 2 diabetes mellitus with unspecified complications: Secondary | ICD-10-CM | POA: Diagnosis not present

## 2022-07-03 DIAGNOSIS — L97512 Non-pressure chronic ulcer of other part of right foot with fat layer exposed: Secondary | ICD-10-CM | POA: Diagnosis not present

## 2022-07-03 DIAGNOSIS — E1142 Type 2 diabetes mellitus with diabetic polyneuropathy: Secondary | ICD-10-CM | POA: Diagnosis not present

## 2022-07-06 DIAGNOSIS — E1165 Type 2 diabetes mellitus with hyperglycemia: Secondary | ICD-10-CM | POA: Diagnosis not present

## 2022-07-17 DIAGNOSIS — L84 Corns and callosities: Secondary | ICD-10-CM | POA: Diagnosis not present

## 2022-07-17 DIAGNOSIS — E1142 Type 2 diabetes mellitus with diabetic polyneuropathy: Secondary | ICD-10-CM | POA: Diagnosis not present

## 2022-07-23 DIAGNOSIS — L909 Atrophic disorder of skin, unspecified: Secondary | ICD-10-CM | POA: Diagnosis not present

## 2022-07-23 DIAGNOSIS — R809 Proteinuria, unspecified: Secondary | ICD-10-CM | POA: Diagnosis not present

## 2022-07-23 DIAGNOSIS — I10 Essential (primary) hypertension: Secondary | ICD-10-CM | POA: Diagnosis not present

## 2022-07-23 DIAGNOSIS — E785 Hyperlipidemia, unspecified: Secondary | ICD-10-CM | POA: Diagnosis not present

## 2022-07-23 DIAGNOSIS — E1159 Type 2 diabetes mellitus with other circulatory complications: Secondary | ICD-10-CM | POA: Diagnosis not present

## 2022-07-23 DIAGNOSIS — E113299 Type 2 diabetes mellitus with mild nonproliferative diabetic retinopathy without macular edema, unspecified eye: Secondary | ICD-10-CM | POA: Diagnosis not present

## 2022-07-23 DIAGNOSIS — L84 Corns and callosities: Secondary | ICD-10-CM | POA: Diagnosis not present

## 2022-07-23 DIAGNOSIS — E1142 Type 2 diabetes mellitus with diabetic polyneuropathy: Secondary | ICD-10-CM | POA: Diagnosis not present

## 2022-07-23 DIAGNOSIS — E1169 Type 2 diabetes mellitus with other specified complication: Secondary | ICD-10-CM | POA: Diagnosis not present

## 2022-07-23 DIAGNOSIS — M216X1 Other acquired deformities of right foot: Secondary | ICD-10-CM | POA: Diagnosis not present

## 2022-07-23 DIAGNOSIS — E1129 Type 2 diabetes mellitus with other diabetic kidney complication: Secondary | ICD-10-CM | POA: Diagnosis not present

## 2022-07-23 DIAGNOSIS — E1143 Type 2 diabetes mellitus with diabetic autonomic (poly)neuropathy: Secondary | ICD-10-CM | POA: Diagnosis not present

## 2022-07-23 DIAGNOSIS — G894 Chronic pain syndrome: Secondary | ICD-10-CM | POA: Diagnosis not present

## 2022-07-23 DIAGNOSIS — Z794 Long term (current) use of insulin: Secondary | ICD-10-CM | POA: Diagnosis not present

## 2022-07-23 DIAGNOSIS — E1165 Type 2 diabetes mellitus with hyperglycemia: Secondary | ICD-10-CM | POA: Diagnosis not present

## 2022-08-19 DIAGNOSIS — M62830 Muscle spasm of back: Secondary | ICD-10-CM | POA: Diagnosis not present

## 2022-08-19 DIAGNOSIS — M542 Cervicalgia: Secondary | ICD-10-CM | POA: Diagnosis not present

## 2022-08-19 DIAGNOSIS — M1990 Unspecified osteoarthritis, unspecified site: Secondary | ICD-10-CM | POA: Diagnosis not present

## 2022-08-19 DIAGNOSIS — M5136 Other intervertebral disc degeneration, lumbar region: Secondary | ICD-10-CM | POA: Diagnosis not present

## 2022-08-19 DIAGNOSIS — M545 Low back pain, unspecified: Secondary | ICD-10-CM | POA: Diagnosis not present

## 2022-08-19 DIAGNOSIS — G8929 Other chronic pain: Secondary | ICD-10-CM | POA: Diagnosis not present

## 2022-08-19 DIAGNOSIS — R202 Paresthesia of skin: Secondary | ICD-10-CM | POA: Diagnosis not present

## 2022-08-21 ENCOUNTER — Other Ambulatory Visit: Payer: Self-pay

## 2022-08-21 ENCOUNTER — Ambulatory Visit
Admission: RE | Admit: 2022-08-21 | Discharge: 2022-08-21 | Disposition: A | Discharge status: Home / Self Care | Payer: PPO | Source: Ambulatory Visit

## 2022-08-21 DIAGNOSIS — M542 Cervicalgia: Secondary | ICD-10-CM

## 2022-08-21 DIAGNOSIS — M549 Dorsalgia, unspecified: Secondary | ICD-10-CM | POA: Diagnosis not present

## 2022-08-21 DIAGNOSIS — M545 Low back pain, unspecified: Secondary | ICD-10-CM

## 2022-08-21 DIAGNOSIS — M5136 Other intervertebral disc degeneration, lumbar region: Secondary | ICD-10-CM | POA: Diagnosis not present

## 2022-09-10 DIAGNOSIS — L84 Corns and callosities: Secondary | ICD-10-CM | POA: Diagnosis not present

## 2022-09-10 DIAGNOSIS — E1142 Type 2 diabetes mellitus with diabetic polyneuropathy: Secondary | ICD-10-CM | POA: Diagnosis not present

## 2022-10-15 DIAGNOSIS — Z5181 Encounter for therapeutic drug level monitoring: Secondary | ICD-10-CM | POA: Diagnosis not present

## 2022-10-15 DIAGNOSIS — G8929 Other chronic pain: Secondary | ICD-10-CM | POA: Diagnosis not present

## 2022-10-15 DIAGNOSIS — M545 Low back pain, unspecified: Secondary | ICD-10-CM | POA: Diagnosis not present

## 2022-10-15 DIAGNOSIS — M542 Cervicalgia: Secondary | ICD-10-CM | POA: Diagnosis not present

## 2022-10-15 DIAGNOSIS — M5136 Other intervertebral disc degeneration, lumbar region: Secondary | ICD-10-CM | POA: Diagnosis not present

## 2022-10-15 DIAGNOSIS — G894 Chronic pain syndrome: Secondary | ICD-10-CM | POA: Diagnosis not present

## 2022-10-15 DIAGNOSIS — M62830 Muscle spasm of back: Secondary | ICD-10-CM | POA: Diagnosis not present

## 2022-10-15 DIAGNOSIS — R202 Paresthesia of skin: Secondary | ICD-10-CM | POA: Diagnosis not present

## 2022-10-15 DIAGNOSIS — M1990 Unspecified osteoarthritis, unspecified site: Secondary | ICD-10-CM | POA: Diagnosis not present

## 2022-10-15 DIAGNOSIS — Z79891 Long term (current) use of opiate analgesic: Secondary | ICD-10-CM | POA: Diagnosis not present

## 2022-10-18 DIAGNOSIS — J4540 Moderate persistent asthma, uncomplicated: Secondary | ICD-10-CM | POA: Diagnosis not present

## 2022-10-18 DIAGNOSIS — E1165 Type 2 diabetes mellitus with hyperglycemia: Secondary | ICD-10-CM | POA: Diagnosis not present

## 2022-10-18 DIAGNOSIS — J0100 Acute maxillary sinusitis, unspecified: Secondary | ICD-10-CM | POA: Diagnosis not present

## 2022-10-18 DIAGNOSIS — I10 Essential (primary) hypertension: Secondary | ICD-10-CM | POA: Diagnosis not present

## 2022-11-06 ENCOUNTER — Other Ambulatory Visit: Payer: Self-pay

## 2022-11-06 ENCOUNTER — Emergency Department: Payer: PPO

## 2022-11-06 ENCOUNTER — Emergency Department
Admission: EM | Admit: 2022-11-06 | Discharge: 2022-11-06 | Disposition: A | Discharge status: Home / Self Care | Payer: PPO | Attending: Emergency Medicine | Admitting: Emergency Medicine

## 2022-11-06 ENCOUNTER — Encounter: Payer: Self-pay | Admitting: Emergency Medicine

## 2022-11-06 DIAGNOSIS — Z794 Long term (current) use of insulin: Secondary | ICD-10-CM | POA: Insufficient documentation

## 2022-11-06 DIAGNOSIS — I2510 Atherosclerotic heart disease of native coronary artery without angina pectoris: Secondary | ICD-10-CM | POA: Diagnosis not present

## 2022-11-06 DIAGNOSIS — J209 Acute bronchitis, unspecified: Secondary | ICD-10-CM | POA: Diagnosis not present

## 2022-11-06 DIAGNOSIS — Z7984 Long term (current) use of oral hypoglycemic drugs: Secondary | ICD-10-CM | POA: Insufficient documentation

## 2022-11-06 DIAGNOSIS — Z20822 Contact with and (suspected) exposure to covid-19: Secondary | ICD-10-CM | POA: Diagnosis not present

## 2022-11-06 DIAGNOSIS — Z79899 Other long term (current) drug therapy: Secondary | ICD-10-CM | POA: Insufficient documentation

## 2022-11-06 DIAGNOSIS — E119 Type 2 diabetes mellitus without complications: Secondary | ICD-10-CM | POA: Insufficient documentation

## 2022-11-06 DIAGNOSIS — R059 Cough, unspecified: Secondary | ICD-10-CM | POA: Diagnosis not present

## 2022-11-06 DIAGNOSIS — J45909 Unspecified asthma, uncomplicated: Secondary | ICD-10-CM | POA: Diagnosis not present

## 2022-11-06 DIAGNOSIS — I10 Essential (primary) hypertension: Secondary | ICD-10-CM | POA: Insufficient documentation

## 2022-11-06 DIAGNOSIS — Z7982 Long term (current) use of aspirin: Secondary | ICD-10-CM | POA: Insufficient documentation

## 2022-11-06 LAB — SARS CORONAVIRUS 2 BY RT PCR: SARS Coronavirus 2 by RT PCR: NEGATIVE

## 2022-11-06 MED ORDER — PREDNISONE 20 MG PO TABS: ORAL_TABLET | ORAL | 0 refills | Status: DC

## 2022-11-06 MED ORDER — BENZONATATE 100 MG PO CAPS
200.0000 mg | ORAL_CAPSULE | Freq: Once | ORAL | Status: AC
  Administered 2022-11-06: 200 mg via ORAL
  Filled 2022-11-06: qty 2

## 2022-11-06 MED ORDER — PREDNISONE 20 MG PO TABS
60.0000 mg | ORAL_TABLET | Freq: Once | ORAL | Status: AC
  Administered 2022-11-06: 60 mg via ORAL
  Filled 2022-11-06: qty 3

## 2022-11-06 MED ORDER — HYDROCOD POLI-CHLORPHE POLI ER 10-8 MG/5ML PO SUER: 5.0000 mL | Freq: Two times a day (BID) | ORAL | 0 refills | Status: DC | PRN

## 2022-11-06 MED ORDER — ALBUTEROL SULFATE HFA 108 (90 BASE) MCG/ACT IN AERS
2.0000 | INHALATION_SPRAY | Freq: Once | RESPIRATORY_TRACT | Status: AC
  Administered 2022-11-06: 2 via RESPIRATORY_TRACT
  Filled 2022-11-06: qty 6.7

## 2022-11-06 NOTE — ED Provider Notes (Signed)
[facility] Provider Note    Event Date/Time   First MD Initiated Contact with Patient 11/06/22 [phone]     (approximate)   History   Cough   HPI  Gerald Ellis is a 57 y.o. male who presents to the ED from home with a chief complaint of nonproductive cough x 3 weeks.  Patient has seen his PCP and is currently on his second round of antibiotics for a presumed sinus infection.  Denies fever/chills, shortness of breath, abdominal pain, nausea, vomiting or dizziness.     Past Medical History   Past Medical History:  Diagnosis Date   Benign enlargement of prostate    Bronchitis    Childhood asthma    Diabetes mellitus without complication (HCC)    Environmental allergies    Erectile dysfunction    Headache    Hemorrhoids    Hypercholesteremia    Hypertension    Hypogonadism in male    Lumbago    Over weight      Active Problem List   Patient Active Problem List   Diagnosis Date Noted   Genetic testing 04/09/2022   Chronic venous insufficiency 06/29/2020   Lymphedema 06/29/2020   PAD (peripheral artery disease) (HCC) 05/30/2020   Ankle ulcer, right, with fat layer exposed (HCC) 05/30/2020   Allergy 05/29/2020   Asthma without status asthmaticus 05/29/2020   Diabetes mellitus type 2, uncomplicated (HCC) 05/29/2020   S/P coronary artery stent placement 04/30/2019   CAD (coronary artery disease) 04/20/2019   Abnormal ECG 03/18/2019   Chest pain with high risk for cardiac etiology 03/18/2019   SOB (shortness of breath) on exertion 03/18/2019   Hypercholesterolemia 05/07/2015   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   Hypogonadism in male 11/22/2014   BPH with obstruction/lower urinary tract symptoms 11/22/2014   DDD (degenerative disc disease), lumbar 10/13/2014   Status post lumbar laminectomy 10/13/2014   DJD of shoulder 10/13/2014   Bilateral occipital neuralgia 10/13/2014   Lumbar radiculopathy 10/13/2014   DDD  (degenerative disc disease), cervical 10/13/2014   Depression 08/25/2013   Essential hypertension 08/25/2013   History of hemorrhoids 08/25/2013   Low back pain 08/25/2013     Past Surgical History   Past Surgical History:  Procedure Laterality Date   BACK SURGERY  1992   CHOLECYSTECTOMY     COLONOSCOPY WITH PROPOFOL N/A 08/07/2015   Procedure: COLONOSCOPY WITH PROPOFOL;  Surgeon: Christena Deem, MD;  Location: [facility] ENDOSCOPY;  Service: Endoscopy;  Laterality: N/A;   COLONOSCOPY WITH PROPOFOL N/A 08/08/2015   Procedure: COLONOSCOPY WITH PROPOFOL;  Surgeon: Christena Deem, MD;  Location: [facility] ENDOSCOPY;  Service: Endoscopy;  Laterality: N/A;   COLONOSCOPY WITH PROPOFOL N/A 09/16/2017   Procedure: COLONOSCOPY WITH PROPOFOL;  Surgeon: Christena Deem, MD;  Location: [facility] ENDOSCOPY;  Service: Endoscopy;  Laterality: N/A;   COLONOSCOPY WITH PROPOFOL N/A 12/20/2020   Procedure: COLONOSCOPY WITH PROPOFOL;  Surgeon: Earline Mayotte, MD;  Location: [facility] ENDOSCOPY;  Service: Endoscopy;  Laterality: N/A;  IDDM   CORONARY STENT INTERVENTION N/A 04/20/2019   Procedure: CORONARY STENT INTERVENTION;  Surgeon: Marcina Millard, MD;  Location: [facility] INVASIVE CV LAB;  Service: Cardiovascular;  Laterality: N/A;   ESOPHAGOGASTRODUODENOSCOPY (EGD) WITH PROPOFOL N/A 08/07/2015   Procedure: ESOPHAGOGASTRODUODENOSCOPY (EGD) WITH PROPOFOL;  Surgeon: Christena Deem, MD;  Location: [facility] ENDOSCOPY;  Service: Endoscopy;  Laterality: N/A;   ESOPHAGOGASTRODUODENOSCOPY (EGD) WITH PROPOFOL N/A 09/16/2017   Procedure: ESOPHAGOGASTRODUODENOSCOPY (EGD) WITH PROPOFOL;  Surgeon: Barnetta Chapel  U, MD;  Location: [facility] ENDOSCOPY;  Service: Endoscopy;  Laterality: N/A;   ESOPHAGOGASTRODUODENOSCOPY (EGD) WITH PROPOFOL N/A 12/20/2020   Procedure: ESOPHAGOGASTRODUODENOSCOPY (EGD) WITH PROPOFOL;  Surgeon: Earline Mayotte, MD;  Location: [facility] ENDOSCOPY;  Service: Endoscopy;  Laterality: N/A;   LEFT HEART CATH AND  CORONARY ANGIOGRAPHY Left 04/20/2019   Procedure: LEFT HEART CATH AND CORONARY ANGIOGRAPHY;  Surgeon: Marcina Millard, MD;  Location: [facility] INVASIVE CV LAB;  Service: Cardiovascular;  Laterality: Left;     Home Medications   Prior to Admission medications   Medication Sig Start Date End Date Taking? Authorizing Provider  amLODipine (NORVASC) 5 MG tablet Take 1 tablet (5 mg total) by mouth daily. 04/21/19   Leanora Ivanoff, PA-C  aspirin 81 MG chewable tablet Chew 1 tablet (81 mg total) by mouth daily. 04/21/19   Leanora Ivanoff, PA-C  atorvastatin (LIPITOR) 80 MG tablet Take 1 tablet (80 mg total) by mouth daily at 6 PM. 04/21/19   Leanora Ivanoff, PA-C  busPIRone (BUSPAR) 7.5 MG tablet Take 7.5 mg by mouth 2 (two) times daily.    [provider]  cetirizine (ZYRTEC) 10 MG tablet Take 10 mg by mouth daily.    [provider]  cyclobenzaprine (FLEXERIL) 10 MG tablet TAKE 1/2 TO 1 (ONE-HALF TO ONE) TABLET BY MOUTH PER DAY OR TWICE DAILY IF TOLERATED. 06/08/20   [provider]  diclofenac Sodium (VOLTAREN) 1 % GEL Apply 2 g topically 4 (four) times daily.    [provider]  fluticasone (FLONASE) 50 MCG/ACT nasal spray Place 1 spray into both nostrils 2 (two) times daily as needed (nasal congestion).    [provider]  Fluticasone-Umeclidin-Vilant 200-62.5-25 MCG/INH AEPB Inhale into the lungs. 04/24/20   [provider]  HYDROcodone-acetaminophen (NORCO/VICODIN) 5-325 MG tablet Limit 1 tab by mouth per day or twice a day if tolerated Patient taking differently: Take 0.5-1 tablets by mouth every 4 (four) hours as needed (pain.). 01/04/16   Ewing Schlein, MD  insulin regular human CONCENTRATED (HUMULIN R U-500 KWIKPEN) 500 UNIT/ML kwikpen Inject 30 Units into the skin 2 (two) times daily.    [provider]  Ipratropium-Albuterol (COMBIVENT) 20-100 MCG/ACT AERS respimat Inhale into the lungs. 12/15/19 12/14/20  [provider]  isosorbide  mononitrate (IMDUR) 30 MG 24 hr tablet Take 30 mg by mouth daily.    [provider]  lisinopril (ZESTRIL) 20 MG tablet Take 1 tablet (20 mg total) by mouth daily. 04/21/19   Leanora Ivanoff, PA-C  metFORMIN (GLUCOPHAGE) 1000 MG tablet Take 1,000 mg by mouth 2 (two) times daily.     [provider]  metoCLOPramide (REGLAN) 5 MG tablet Take 5 mg by mouth 2 (two) times daily.    [provider]  metoprolol succinate (TOPROL-XL) 50 MG 24 hr tablet Take 50 mg by mouth daily. Take with or immediately following a meal.    [provider]  montelukast (SINGULAIR) 10 MG tablet Take by mouth. 12/15/19 12/14/20  [provider]  Multiple Vitamins-Minerals (MULTIVITAMIN WITH MINERALS) tablet Take 1 tablet by mouth 2 (two) times daily.     [provider]  mupirocin ointment (BACTROBAN) 2 % SMARTSIG:1 Application Topical 2-3 Times Daily 04/06/20   [provider]  Omega-3 Fatty Acids (FISH OIL) 500 MG CAPS Take 500 mg by mouth 2 (two) times daily.    [provider]  pantoprazole (PROTONIX) 40 MG tablet Take 40 mg by mouth 2 (two) times daily.  04/07/15   [provider]  pioglitazone (ACTOS) 15 MG tablet Take 15 mg by mouth 2 (two) times daily.    [provider]  prasugrel (EFFIENT) 10 MG TABS tablet Take 1 tablet (10 mg total) by mouth daily. 04/21/19   Leanora Ivanoff, PA-C  silver sulfADIAZINE (SILVADENE) 1 % cream Apply 1 application topically daily. 05/30/20   Schnier, Latina Craver, MD  tadalafil (CIALIS) 20 MG tablet Take 20 mg by mouth daily as needed for erectile dysfunction.    [provider]     Allergies  Ibuprofen and Nsaids   Family History   Family History  Problem Relation Age of Onset   Heart disease Mother        CABG   Lung cancer Mother        Disseminated   Hypertension Mother    Hyperlipidemia Mother    Diabetes Father        mother   Colon cancer Father 35   Cancer Maternal Aunt         x3 aunts, unk types of cancer   Colon cancer Maternal Uncle        x3 uncles   Prostate cancer Maternal Uncle        x4 uncles   Cancer Paternal Uncle        prostate vs colon   Alzheimer's disease Other    Kidney disease Neg Hx      Physical Exam  Triage Vital Signs: ED Triage Vitals  Enc Vitals Group     BP 11/06/22 0014 (!) 173/102     Pulse Rate 11/06/22 0014 97     Resp 11/06/22 0014 20     Temp 11/06/22 0014 98 F (36.7 C)     Temp Source 11/06/22 0014 Oral     SpO2 11/06/22 0014 100 %     Weight 11/06/22 0011 224 lb 13.9 oz (102 kg)     Height 11/06/22 0011 5\' 9"  (1.753 m)     Head Circumference --      Peak Flow --      Pain Score 11/06/22 0010 4     Pain Loc --      Pain Edu? --      Excl. in GC? --     Updated Vital Signs: BP (!) 173/102 (BP Location: Left Arm)   Pulse 97   Temp 98 F (36.7 C) (Oral)   Resp 20   Ht 5\' 9"  (1.753 m)   Wt 102 kg   SpO2 100%   BMI 33.21 kg/m    General: Awake, mild distress.  CV:  RRR.  Good peripheral perfusion.  Resp:  Normal effort.  CTAB.  Active dry cough noted. Abd:  No distention.  Other:  Bilateral calves are supple and nontender.   ED Results / Procedures / Treatments  Labs (all labs ordered are listed, but only abnormal results are displayed) Labs Reviewed  SARS CORONAVIRUS 2 BY RT PCR     EKG  None   RADIOLOGY I have independently visualized and interpreted patient's x-ray as well as noted the radiology interpretation:  Chest x-ray: No acute cardiopulmonary process  Official radiology report(s): DG Chest 2 View  Result Date: 11/06/2022 CLINICAL DATA:  Cough EXAM: CHEST - 2 VIEW COMPARISON:  03/21/2021 FINDINGS: The heart size and mediastinal contours are within normal limits. Both lungs are clear. The visualized skeletal structures are unremarkable. IMPRESSION: No active cardiopulmonary disease. Electronically Signed   By: Alcide Clever M.D.   On:  11/06/2022 00:33     PROCEDURES:  Critical  Care performed: No  Procedures   MEDICATIONS ORDERED IN ED: Medications  albuterol (VENTOLIN HFA) 108 (90 Base) MCG/ACT inhaler 2 puff (has no administration in time range)  predniSONE (DELTASONE) tablet 60 mg (60 mg Oral Given 11/06/22 0140)  benzonatate (TESSALON) capsule 200 mg (200 mg Oral Given 11/06/22 0140)     IMPRESSION / MDM / ASSESSMENT AND PLAN / ED COURSE  I reviewed the triage vital signs and the nursing notes.                             57 year old male presenting with several weeks of cough. Differential includes, but is not limited to, viral syndrome, bronchitis including COPD exacerbation, pneumonia, reactive airway disease including asthma, CHF including exacerbation with or without pulmonary/interstitial edema, pneumothorax, ACS, thoracic trauma, and pulmonary embolism.  I personally reviewed patient's records and note a PCP office visit from 10/18/2022 for the above symptoms.  Patient's presentation is most consistent with illness requiring diagnostic workup.  The patient is on the cardiac monitor to evaluate for evidence of arrhythmia and/or significant heart rate changes.  X-ray and COVID-negative.  Patient is driving so we will dose Tessalon now but prescription for Tussionex to go home on.  Start prednisone, albuterol inhaler.  Caution patient that he will have temporary rise in his blood sugars due to prednisone.  He will follow-up closely with his PCP.  Strict return precautions given.  Patient verbalizes understanding and agrees with plan of care.      FINAL CLINICAL IMPRESSION(S) / ED DIAGNOSES   Final diagnoses:  Acute bronchitis, unspecified organism     Rx / DC Orders   ED Discharge Orders     None        Note:  This document was prepared using Dragon voice recognition software and may include unintentional dictation errors.   Irean Hong, MD 11/06/22 [phone]

## 2022-11-06 NOTE — Discharge Instructions (Signed)
Take and finish steroid as prescribed.  You may take Tussionex as needed for cough.  Use albuterol inhaler 2 puffs every 4 hours as needed for difficulty breathing.  Return to the ER for worsening symptoms, persistent vomiting, difficulty breathing or other concerns.

## 2022-11-06 NOTE — ED Triage Notes (Signed)
Pt presents ambulatory to triage via POV with complaints of cough x 3 weeks. Pt has been on two rounds of antibiotics for sinus infection by his PCP. He states that the cough improved but came back 2 days ago. A&Ox4 at this time. Denies CP.

## 2022-11-11 DIAGNOSIS — M62830 Muscle spasm of back: Secondary | ICD-10-CM | POA: Diagnosis not present

## 2022-11-11 DIAGNOSIS — E113299 Type 2 diabetes mellitus with mild nonproliferative diabetic retinopathy without macular edema, unspecified eye: Secondary | ICD-10-CM | POA: Diagnosis not present

## 2022-11-11 DIAGNOSIS — M5136 Other intervertebral disc degeneration, lumbar region: Secondary | ICD-10-CM | POA: Diagnosis not present

## 2022-11-11 DIAGNOSIS — M545 Low back pain, unspecified: Secondary | ICD-10-CM | POA: Diagnosis not present

## 2022-11-11 DIAGNOSIS — M1990 Unspecified osteoarthritis, unspecified site: Secondary | ICD-10-CM | POA: Diagnosis not present

## 2022-11-11 DIAGNOSIS — E1169 Type 2 diabetes mellitus with other specified complication: Secondary | ICD-10-CM | POA: Diagnosis not present

## 2022-11-11 DIAGNOSIS — E1143 Type 2 diabetes mellitus with diabetic autonomic (poly)neuropathy: Secondary | ICD-10-CM | POA: Diagnosis not present

## 2022-11-11 DIAGNOSIS — R202 Paresthesia of skin: Secondary | ICD-10-CM | POA: Diagnosis not present

## 2022-11-11 DIAGNOSIS — E1165 Type 2 diabetes mellitus with hyperglycemia: Secondary | ICD-10-CM | POA: Diagnosis not present

## 2022-11-11 DIAGNOSIS — G894 Chronic pain syndrome: Secondary | ICD-10-CM | POA: Diagnosis not present

## 2022-11-11 DIAGNOSIS — E1159 Type 2 diabetes mellitus with other circulatory complications: Secondary | ICD-10-CM | POA: Diagnosis not present

## 2022-11-11 DIAGNOSIS — G8929 Other chronic pain: Secondary | ICD-10-CM | POA: Diagnosis not present

## 2022-11-11 DIAGNOSIS — E1129 Type 2 diabetes mellitus with other diabetic kidney complication: Secondary | ICD-10-CM | POA: Diagnosis not present

## 2022-11-11 DIAGNOSIS — R809 Proteinuria, unspecified: Secondary | ICD-10-CM | POA: Diagnosis not present

## 2022-11-11 DIAGNOSIS — I10 Essential (primary) hypertension: Secondary | ICD-10-CM | POA: Diagnosis not present

## 2022-11-11 DIAGNOSIS — Z794 Long term (current) use of insulin: Secondary | ICD-10-CM | POA: Diagnosis not present

## 2022-11-11 DIAGNOSIS — E785 Hyperlipidemia, unspecified: Secondary | ICD-10-CM | POA: Diagnosis not present

## 2022-11-13 ENCOUNTER — Telehealth: Payer: Self-pay

## 2022-11-13 NOTE — Telephone Encounter (Signed)
Transition Care Management Follow-up Telephone Call Date of discharge and from where: 11/06/2022 [facility] How have you been since you were released from the hospital? Patient is not feeling any better. Any questions or concerns? No  Items Reviewed: Did the pt receive and understand the discharge instructions provided? Yes  Medications obtained and verified? Yes  Other? No  Any new allergies since your discharge? No  Dietary orders reviewed? Yes Do you have support at home? Yes   Follow up appointments reviewed:  PCP Hospital f/u appt confirmed?  Patient stated he is not feeling better and will call today for an appointment  Scheduled to see  on  @ . Specialist Hospital f/u appt confirmed? No  Scheduled to see  on  @ . Are transportation arrangements needed? No  If their condition worsens, is the pt aware to call PCP or go to the Emergency Dept.? Yes Was the patient provided with contact information for the PCP's office or ED? Yes Was to pt encouraged to call back with questions or concerns? Yes  Joby Hershkowitz [facility]  [facility] Population Health Community Resource Care Guide   [email or web address]  ?? [phone]   Website: [email or web address]  [email or web address]

## 2022-11-28 DIAGNOSIS — E119 Type 2 diabetes mellitus without complications: Secondary | ICD-10-CM | POA: Diagnosis not present

## 2022-11-28 DIAGNOSIS — Z955 Presence of coronary angioplasty implant and graft: Secondary | ICD-10-CM | POA: Diagnosis not present

## 2022-11-28 DIAGNOSIS — Z794 Long term (current) use of insulin: Secondary | ICD-10-CM | POA: Diagnosis not present

## 2022-11-28 DIAGNOSIS — E7800 Pure hypercholesterolemia, unspecified: Secondary | ICD-10-CM | POA: Diagnosis not present

## 2022-11-28 DIAGNOSIS — I10 Essential (primary) hypertension: Secondary | ICD-10-CM | POA: Diagnosis not present

## 2022-11-28 DIAGNOSIS — R0602 Shortness of breath: Secondary | ICD-10-CM | POA: Diagnosis not present

## 2022-11-28 DIAGNOSIS — I25118 Atherosclerotic heart disease of native coronary artery with other forms of angina pectoris: Secondary | ICD-10-CM | POA: Diagnosis not present

## 2022-12-09 DIAGNOSIS — Z79891 Long term (current) use of opiate analgesic: Secondary | ICD-10-CM | POA: Diagnosis not present

## 2022-12-09 DIAGNOSIS — I10 Essential (primary) hypertension: Secondary | ICD-10-CM | POA: Diagnosis not present

## 2022-12-09 DIAGNOSIS — M5136 Other intervertebral disc degeneration, lumbar region: Secondary | ICD-10-CM | POA: Diagnosis not present

## 2022-12-09 DIAGNOSIS — M62830 Muscle spasm of back: Secondary | ICD-10-CM | POA: Diagnosis not present

## 2022-12-09 DIAGNOSIS — G894 Chronic pain syndrome: Secondary | ICD-10-CM | POA: Diagnosis not present

## 2022-12-09 DIAGNOSIS — R202 Paresthesia of skin: Secondary | ICD-10-CM | POA: Diagnosis not present

## 2022-12-09 DIAGNOSIS — M545 Low back pain, unspecified: Secondary | ICD-10-CM | POA: Diagnosis not present

## 2022-12-09 DIAGNOSIS — G8929 Other chronic pain: Secondary | ICD-10-CM | POA: Diagnosis not present

## 2022-12-09 DIAGNOSIS — M1990 Unspecified osteoarthritis, unspecified site: Secondary | ICD-10-CM | POA: Diagnosis not present

## 2022-12-24 DIAGNOSIS — E113299 Type 2 diabetes mellitus with mild nonproliferative diabetic retinopathy without macular edema, unspecified eye: Secondary | ICD-10-CM | POA: Diagnosis not present

## 2022-12-24 DIAGNOSIS — E1159 Type 2 diabetes mellitus with other circulatory complications: Secondary | ICD-10-CM | POA: Diagnosis not present

## 2022-12-24 DIAGNOSIS — E1143 Type 2 diabetes mellitus with diabetic autonomic (poly)neuropathy: Secondary | ICD-10-CM | POA: Diagnosis not present

## 2022-12-24 DIAGNOSIS — E1129 Type 2 diabetes mellitus with other diabetic kidney complication: Secondary | ICD-10-CM | POA: Diagnosis not present

## 2022-12-24 DIAGNOSIS — E1169 Type 2 diabetes mellitus with other specified complication: Secondary | ICD-10-CM | POA: Diagnosis not present

## 2022-12-24 DIAGNOSIS — R809 Proteinuria, unspecified: Secondary | ICD-10-CM | POA: Diagnosis not present

## 2022-12-24 DIAGNOSIS — Z794 Long term (current) use of insulin: Secondary | ICD-10-CM | POA: Diagnosis not present

## 2022-12-24 DIAGNOSIS — E785 Hyperlipidemia, unspecified: Secondary | ICD-10-CM | POA: Diagnosis not present

## 2022-12-24 DIAGNOSIS — E1165 Type 2 diabetes mellitus with hyperglycemia: Secondary | ICD-10-CM | POA: Diagnosis not present

## 2022-12-24 DIAGNOSIS — I10 Essential (primary) hypertension: Secondary | ICD-10-CM | POA: Diagnosis not present

## 2022-12-24 DIAGNOSIS — Z599 Problem related to housing and economic circumstances, unspecified: Secondary | ICD-10-CM | POA: Diagnosis not present

## 2023-01-06 DIAGNOSIS — R202 Paresthesia of skin: Secondary | ICD-10-CM | POA: Diagnosis not present

## 2023-01-06 DIAGNOSIS — M545 Low back pain, unspecified: Secondary | ICD-10-CM | POA: Diagnosis not present

## 2023-01-06 DIAGNOSIS — M5136 Other intervertebral disc degeneration, lumbar region: Secondary | ICD-10-CM | POA: Diagnosis not present

## 2023-01-06 DIAGNOSIS — G894 Chronic pain syndrome: Secondary | ICD-10-CM | POA: Diagnosis not present

## 2023-01-06 DIAGNOSIS — M1990 Unspecified osteoarthritis, unspecified site: Secondary | ICD-10-CM | POA: Diagnosis not present

## 2023-01-06 DIAGNOSIS — G8929 Other chronic pain: Secondary | ICD-10-CM | POA: Diagnosis not present

## 2023-01-06 DIAGNOSIS — M62830 Muscle spasm of back: Secondary | ICD-10-CM | POA: Diagnosis not present

## 2023-01-28 DIAGNOSIS — G894 Chronic pain syndrome: Secondary | ICD-10-CM | POA: Diagnosis not present

## 2023-01-28 DIAGNOSIS — L97512 Non-pressure chronic ulcer of other part of right foot with fat layer exposed: Secondary | ICD-10-CM | POA: Diagnosis not present

## 2023-01-28 DIAGNOSIS — L603 Nail dystrophy: Secondary | ICD-10-CM | POA: Diagnosis not present

## 2023-01-28 DIAGNOSIS — L909 Atrophic disorder of skin, unspecified: Secondary | ICD-10-CM | POA: Diagnosis not present

## 2023-01-28 DIAGNOSIS — M216X1 Other acquired deformities of right foot: Secondary | ICD-10-CM | POA: Diagnosis not present

## 2023-01-28 DIAGNOSIS — E1142 Type 2 diabetes mellitus with diabetic polyneuropathy: Secondary | ICD-10-CM | POA: Diagnosis not present

## 2023-01-28 DIAGNOSIS — L84 Corns and callosities: Secondary | ICD-10-CM | POA: Diagnosis not present

## 2023-01-30 DIAGNOSIS — E118 Type 2 diabetes mellitus with unspecified complications: Secondary | ICD-10-CM | POA: Diagnosis not present

## 2023-02-03 DIAGNOSIS — M1990 Unspecified osteoarthritis, unspecified site: Secondary | ICD-10-CM | POA: Diagnosis not present

## 2023-02-03 DIAGNOSIS — M5136 Other intervertebral disc degeneration, lumbar region: Secondary | ICD-10-CM | POA: Diagnosis not present

## 2023-02-03 DIAGNOSIS — G8929 Other chronic pain: Secondary | ICD-10-CM | POA: Diagnosis not present

## 2023-02-03 DIAGNOSIS — M62830 Muscle spasm of back: Secondary | ICD-10-CM | POA: Diagnosis not present

## 2023-02-03 DIAGNOSIS — M545 Low back pain, unspecified: Secondary | ICD-10-CM | POA: Diagnosis not present

## 2023-02-03 DIAGNOSIS — Z79891 Long term (current) use of opiate analgesic: Secondary | ICD-10-CM | POA: Diagnosis not present

## 2023-02-03 DIAGNOSIS — I10 Essential (primary) hypertension: Secondary | ICD-10-CM | POA: Diagnosis not present

## 2023-02-03 DIAGNOSIS — G894 Chronic pain syndrome: Secondary | ICD-10-CM | POA: Diagnosis not present

## 2023-02-03 DIAGNOSIS — R202 Paresthesia of skin: Secondary | ICD-10-CM | POA: Diagnosis not present

## 2023-02-12 ENCOUNTER — Encounter: Payer: Self-pay | Admitting: Emergency Medicine

## 2023-02-12 ENCOUNTER — Emergency Department: Payer: PPO

## 2023-02-12 ENCOUNTER — Emergency Department
Admission: EM | Admit: 2023-02-12 | Discharge: 2023-02-12 | Disposition: A | Discharge status: Home / Self Care | Payer: PPO | Attending: Emergency Medicine | Admitting: Emergency Medicine

## 2023-02-12 ENCOUNTER — Other Ambulatory Visit: Payer: Self-pay

## 2023-02-12 DIAGNOSIS — S6991XA Unspecified injury of right wrist, hand and finger(s), initial encounter: Secondary | ICD-10-CM | POA: Diagnosis present

## 2023-02-12 DIAGNOSIS — E119 Type 2 diabetes mellitus without complications: Secondary | ICD-10-CM | POA: Insufficient documentation

## 2023-02-12 DIAGNOSIS — S61219A Laceration without foreign body of unspecified finger without damage to nail, initial encounter: Secondary | ICD-10-CM | POA: Diagnosis not present

## 2023-02-12 DIAGNOSIS — S61210A Laceration without foreign body of right index finger without damage to nail, initial encounter: Secondary | ICD-10-CM | POA: Insufficient documentation

## 2023-02-12 DIAGNOSIS — I10 Essential (primary) hypertension: Secondary | ICD-10-CM | POA: Diagnosis not present

## 2023-02-12 DIAGNOSIS — W28XXXA Contact with powered lawn mower, initial encounter: Secondary | ICD-10-CM | POA: Insufficient documentation

## 2023-02-12 MED ORDER — CEPHALEXIN 500 MG PO CAPS: 500.0000 mg | ORAL_CAPSULE | Freq: Four times a day (QID) | ORAL | 0 refills | Status: AC

## 2023-02-12 MED ORDER — LIDOCAINE HCL (PF) 1 % IJ SOLN
5.0000 mL | Freq: Once | INTRAMUSCULAR | Status: AC
  Administered 2023-02-12: 5 mL via INTRADERMAL
  Filled 2023-02-12: qty 5

## 2023-02-12 NOTE — ED Triage Notes (Signed)
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

## 2023-02-12 NOTE — ED Provider Notes (Signed)
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 male PMH of diabetes and hypertension presents for evaluation of a laceration to right index finger.  Patient states he was working on his lawnmower when he cut himself.  He endorses tingling in the tip of his finger.  Last tetanus shot was in 2021.      Physical Exam   Triage Vital Signs: ED Triage Vitals  Encounter Vitals Group     BP 02/12/23 1409 (!) 185/109     Systolic BP Percentile --      Diastolic BP Percentile --      Pulse Rate 02/12/23 1409 95     Resp 02/12/23 1409 19     Temp 02/12/23 1409 98.4 F (36.9 C)     Temp Source 02/12/23 1409 Oral     SpO2 02/12/23 1409 94 %     Weight 02/12/23 1410 265 lb (120.2 kg)     Height 02/12/23 1410 5\' 9"  (1.753 m)     Head Circumference --      Peak Flow --      Pain Score 02/12/23 1409 4     Pain Loc --      Pain Education --      Exclude from Growth Chart --     Most recent vital signs: Vitals:   02/12/23 1409  BP: (!) 185/109  Pulse: 95  Resp: 19  Temp: 98.4 F (36.9 C)  SpO2: 94%     General: Awake, no distress.  CV:  Good peripheral perfusion.  Resp:  Normal effort.  Abd:  No distention.  Other:  Approximately 3 cm jagged laceration to the base of the right index finger on the palmar side, finger flexion and extension maintained, vascularly intact distal to the laceration.   ED Results / Procedures / Treatments   Labs (all labs ordered are listed, but only abnormal results are displayed) Labs Reviewed - No data to display   RADIOLOGY  Right finger x-rays obtained to  evaluate for an underlying fracture, I interpreted the images as well as reviewed the radiologist report which was negative.   PROCEDURES:  Critical Care performed: No  ..Laceration Repair  Date/Time: 02/12/2023 5:09 PM  Performed by: Lynda Rainwater, Student-PA Authorized by: Cameron Ali, PA-C   Consent:    Consent obtained:  Verbal   Consent given by:  Patient   Risks, benefits, and alternatives were discussed: yes     Risks discussed:  Infection, pain, poor cosmetic result and poor wound healing   Alternatives discussed:  No treatment Universal protocol:    Patient identity confirmed:  Verbally with patient Anesthesia:    Anesthesia method:  Nerve block   Block location:  Base of right index finger   Block needle gauge:  25 G   Block anesthetic:  Lidocaine 1% w/o epi   Block technique:  Flexor tendon sheath   Block injection procedure:  Anatomic landmarks identified, introduced needle and incremental injection   Block outcome:  Anesthesia achieved Laceration details:  Location:  Finger   Finger location:  R index finger   Length (cm):  3   Depth (mm):  5 Pre-procedure details:    Preparation:  Imaging obtained to evaluate for foreign bodies and patient was prepped and draped in usual sterile fashion Exploration:    Hemostasis achieved with:  Direct pressure   Imaging obtained: x-ray     Imaging outcome: foreign body not noted     Wound exploration: wound explored through full range of motion and entire depth of wound visualized   Treatment:    Area cleansed with:  Povidone-iodine   Amount of cleaning:  Standard   Irrigation solution:  Sterile saline Skin repair:    Repair method:  Sutures   Suture size:  4-0   Suture material:  Nylon   Suture technique:  Simple interrupted and horizontal mattress   Number of sutures:  5 (4 simple, 1 horizontal mattress) Approximation:    Approximation:  Close Repair type:    Repair type:  Simple Post-procedure  details:    Dressing:  Bulky dressing   Procedure completion:  Tolerated well, no immediate complications    MEDICATIONS ORDERED IN ED: Medications  lidocaine (PF) (XYLOCAINE) 1 % injection 5 mL (has no administration in time range)     IMPRESSION / MDM / ASSESSMENT AND PLAN / ED COURSE  I reviewed the triage vital signs and the nursing notes.                             57 year old male presents for evaluation of a finger laceration.  Patient is hypertensive in triage but does have history of hypertension.  NAD on exam.  Differential diagnosis includes, but is not limited to, laceration, tendon injury, nerve injury, fracture.  Patient's presentation is most consistent with acute complicated illness / injury requiring diagnostic workup.  Right index finger x-rays obtained to evaluate for an underlying fracture, I interpreted the images as well as reviewed the radiologist report, which was negative.  Laceration repaired as described in the procedure note above.  Patient was advised on wound care.  He will need to have the stitches removed in 7-10 days.  Patient will be sent home with short course of oral antibiotics.  He voiced understanding, all questions were answered and he was stable at discharge.   FINAL CLINICAL IMPRESSION(S) / ED DIAGNOSES   Final diagnoses:  Laceration of right index finger without foreign body without damage to nail, initial encounter     Rx / DC Orders   ED Discharge Orders          Ordered    cephALEXin (KEFLEX) 500 MG capsule  4 times daily        02/12/23 1713             Note:  This document was prepared using Dragon voice recognition software and may include unintentional dictation errors.   Cameron Ali, PA-C 02/12/23 1717    Corena Herter, MD 02/15/23 [phone]

## 2023-02-12 NOTE — Discharge Instructions (Addendum)
Please wash laceration with soap and water daily.  Keep covered with a bandage until stitches are removed.  You will need to have the stitches removed in 7 to 10 days.  This can be done by your primary care provider, urgent care or by this ED.  Please watch for signs of infection including warmth, swelling, redness, pain and purulent drainage.  If you develop any of these please return to the ED.  Please take the antibiotics as prescribed.

## 2023-02-18 DIAGNOSIS — E1142 Type 2 diabetes mellitus with diabetic polyneuropathy: Secondary | ICD-10-CM | POA: Diagnosis not present

## 2023-02-18 DIAGNOSIS — S61210D Laceration without foreign body of right index finger without damage to nail, subsequent encounter: Secondary | ICD-10-CM | POA: Diagnosis not present

## 2023-02-18 DIAGNOSIS — Z0000 Encounter for general adult medical examination without abnormal findings: Secondary | ICD-10-CM | POA: Diagnosis not present

## 2023-02-18 DIAGNOSIS — I10 Essential (primary) hypertension: Secondary | ICD-10-CM | POA: Diagnosis not present

## 2023-02-18 DIAGNOSIS — L97512 Non-pressure chronic ulcer of other part of right foot with fat layer exposed: Secondary | ICD-10-CM | POA: Diagnosis not present

## 2023-02-18 DIAGNOSIS — Z4802 Encounter for removal of sutures: Secondary | ICD-10-CM | POA: Diagnosis not present

## 2023-02-18 DIAGNOSIS — Z23 Encounter for immunization: Secondary | ICD-10-CM | POA: Diagnosis not present

## 2023-02-18 DIAGNOSIS — E1151 Type 2 diabetes mellitus with diabetic peripheral angiopathy without gangrene: Secondary | ICD-10-CM | POA: Diagnosis not present

## 2023-02-18 DIAGNOSIS — E1165 Type 2 diabetes mellitus with hyperglycemia: Secondary | ICD-10-CM | POA: Diagnosis not present

## 2023-02-18 DIAGNOSIS — E7800 Pure hypercholesterolemia, unspecified: Secondary | ICD-10-CM | POA: Diagnosis not present

## 2023-02-18 DIAGNOSIS — J4540 Moderate persistent asthma, uncomplicated: Secondary | ICD-10-CM | POA: Diagnosis not present

## 2023-03-31 DIAGNOSIS — M5030 Other cervical disc degeneration, unspecified cervical region: Secondary | ICD-10-CM | POA: Diagnosis not present

## 2023-03-31 DIAGNOSIS — M545 Low back pain, unspecified: Secondary | ICD-10-CM | POA: Diagnosis not present

## 2023-03-31 DIAGNOSIS — M62830 Muscle spasm of back: Secondary | ICD-10-CM | POA: Diagnosis not present

## 2023-03-31 DIAGNOSIS — I10 Essential (primary) hypertension: Secondary | ICD-10-CM | POA: Diagnosis not present

## 2023-03-31 DIAGNOSIS — M1990 Unspecified osteoarthritis, unspecified site: Secondary | ICD-10-CM | POA: Diagnosis not present

## 2023-03-31 DIAGNOSIS — G8929 Other chronic pain: Secondary | ICD-10-CM | POA: Diagnosis not present

## 2023-03-31 DIAGNOSIS — M5136 Other intervertebral disc degeneration, lumbar region with discogenic back pain only: Secondary | ICD-10-CM | POA: Diagnosis not present

## 2023-03-31 DIAGNOSIS — G894 Chronic pain syndrome: Secondary | ICD-10-CM | POA: Diagnosis not present

## 2023-03-31 DIAGNOSIS — R202 Paresthesia of skin: Secondary | ICD-10-CM | POA: Diagnosis not present

## 2023-04-25 ENCOUNTER — Emergency Department: Payer: PPO

## 2023-04-25 ENCOUNTER — Emergency Department
Admission: EM | Admit: 2023-04-25 | Discharge: 2023-04-25 | Disposition: A | Discharge status: Home / Self Care | Payer: PPO | Attending: Student in an Organized Health Care Education/Training Program | Admitting: Student in an Organized Health Care Education/Training Program

## 2023-04-25 ENCOUNTER — Other Ambulatory Visit: Payer: Self-pay

## 2023-04-25 DIAGNOSIS — K5730 Diverticulosis of large intestine without perforation or abscess without bleeding: Secondary | ICD-10-CM | POA: Diagnosis not present

## 2023-04-25 DIAGNOSIS — R059 Cough, unspecified: Secondary | ICD-10-CM | POA: Diagnosis not present

## 2023-04-25 DIAGNOSIS — J45909 Unspecified asthma, uncomplicated: Secondary | ICD-10-CM | POA: Diagnosis not present

## 2023-04-25 DIAGNOSIS — K209 Esophagitis, unspecified without bleeding: Secondary | ICD-10-CM | POA: Diagnosis not present

## 2023-04-25 DIAGNOSIS — R160 Hepatomegaly, not elsewhere classified: Secondary | ICD-10-CM | POA: Diagnosis not present

## 2023-04-25 DIAGNOSIS — K5750 Diverticulosis of both small and large intestine without perforation or abscess without bleeding: Secondary | ICD-10-CM | POA: Diagnosis not present

## 2023-04-25 DIAGNOSIS — R109 Unspecified abdominal pain: Secondary | ICD-10-CM | POA: Diagnosis not present

## 2023-04-25 DIAGNOSIS — Z20822 Contact with and (suspected) exposure to covid-19: Secondary | ICD-10-CM | POA: Insufficient documentation

## 2023-04-25 LAB — COMPREHENSIVE METABOLIC PANEL
ALT: 38 U/L (ref 0–44)
AST: 42 U/L — ABNORMAL HIGH (ref 15–41)
Albumin: 3.4 g/dL — ABNORMAL LOW (ref 3.5–5.0)
Alkaline Phosphatase: 74 U/L (ref 38–126)
Anion gap: 12 (ref 5–15)
BUN: 22 mg/dL — ABNORMAL HIGH (ref 6–20)
CO2: 25 mmol/L (ref 22–32)
Calcium: 8 mg/dL — ABNORMAL LOW (ref 8.9–10.3)
Chloride: 93 mmol/L — ABNORMAL LOW (ref 98–111)
Creatinine, Ser: 1.08 mg/dL (ref 0.61–1.24)
GFR, Estimated: >60 mL/min (ref >60)
Glucose, Bld: 342 mg/dL — ABNORMAL HIGH (ref 70–99)
Potassium: 3.6 mmol/L (ref 3.5–5.1)
Sodium: 130 mmol/L — ABNORMAL LOW (ref 135–145)
Total Bilirubin: 1.3 mg/dL — ABNORMAL HIGH (ref <1.2)
Total Protein: 7.5 g/dL (ref 6.5–8.1)

## 2023-04-25 LAB — CBC WITH DIFFERENTIAL/PLATELET
Abs Immature Granulocytes: 0.04 10*3/uL (ref 0.00–0.07)
Basophils Absolute: 0 10*3/uL (ref 0.0–0.1)
Basophils Relative: 0 %
Eosinophils Absolute: 0 10*3/uL (ref 0.0–0.5)
Eosinophils Relative: 0 %
HCT: 42.6 % (ref 39.0–52.0)
Hemoglobin: 14.9 g/dL (ref 13.0–17.0)
Immature Granulocytes: 1 %
Lymphocytes Relative: 14 %
Lymphs Abs: 1.2 10*3/uL (ref 0.7–4.0)
MCH: 30 pg (ref 26.0–34.0)
MCHC: 35 g/dL (ref 30.0–36.0)
MCV: 85.7 fL (ref 80.0–100.0)
Monocytes Absolute: 0.5 10*3/uL (ref 0.1–1.0)
Monocytes Relative: 6 %
Neutro Abs: 6.5 10*3/uL (ref 1.7–7.7)
Neutrophils Relative %: 79 %
Platelets: 203 10*3/uL (ref 150–400)
RBC: 4.97 MIL/uL (ref 4.22–5.81)
RDW: 13.1 % (ref 11.5–15.5)
WBC: 8.2 10*3/uL (ref 4.0–10.5)
nRBC: 0 % (ref 0.0–0.2)

## 2023-04-25 LAB — SARS CORONAVIRUS 2 BY RT PCR: SARS Coronavirus 2 by RT PCR: NEGATIVE

## 2023-04-25 LAB — LIPASE, BLOOD: Lipase: 26 U/L (ref 11–51)

## 2023-04-25 MED ORDER — IPRATROPIUM-ALBUTEROL 0.5-2.5 (3) MG/3ML IN SOLN
3.0000 mL | Freq: Once | RESPIRATORY_TRACT | Status: AC
  Administered 2023-04-25: 3 mL via RESPIRATORY_TRACT
  Filled 2023-04-25: qty 3

## 2023-04-25 MED ORDER — ONDANSETRON 4 MG PO TBDP
4.0000 mg | ORAL_TABLET | Freq: Once | ORAL | Status: AC
  Administered 2023-04-25: 4 mg via ORAL
  Filled 2023-04-25: qty 1

## 2023-04-25 MED ORDER — IOHEXOL 300 MG/ML  SOLN: 100.0000 mL | Freq: Once | INTRAMUSCULAR | Status: DC | PRN

## 2023-04-25 MED ORDER — SODIUM CHLORIDE 0.9 % IV BOLUS: 1000.0000 mL | Freq: Once | INTRAVENOUS | Status: DC

## 2023-04-25 MED ORDER — PANTOPRAZOLE SODIUM 40 MG IV SOLR
40.0000 mg | Freq: Once | INTRAVENOUS | Status: AC
  Administered 2023-04-25: 40 mg via INTRAVENOUS
  Filled 2023-04-25: qty 10

## 2023-04-25 MED ORDER — ONDANSETRON 4 MG PO TBDP: 4.0000 mg | ORAL_TABLET | Freq: Three times a day (TID) | ORAL | 0 refills | Status: DC | PRN

## 2023-04-25 MED ORDER — IOHEXOL 300 MG/ML  SOLN
100.0000 mL | Freq: Once | INTRAMUSCULAR | Status: AC | PRN
  Administered 2023-04-25: 100 mL via INTRAVENOUS

## 2023-04-25 MED ORDER — PANTOPRAZOLE SODIUM 40 MG PO TBEC: 40.0000 mg | DELAYED_RELEASE_TABLET | Freq: Every day | ORAL | 1 refills | Status: AC

## 2023-04-25 MED ORDER — SODIUM CHLORIDE 0.9 % IV BOLUS
1000.0000 mL | Freq: Once | INTRAVENOUS | Status: AC
  Administered 2023-04-25: 1000 mL via INTRAVENOUS

## 2023-04-25 NOTE — ED Notes (Signed)
Pt tolerated PO challenge. Provider notified.

## 2023-04-25 NOTE — Discharge Instructions (Addendum)
The CT scan showed that you have esophagitis.  This is inflammation of the esophagus which is the tube that connects the mouth to the stomach.  There are 2 medications that were sent to your pharmacy.  Zofran is the nausea medication.  This can be taken every 8 hours as needed for nausea and vomiting.  Protonix is an acid blocker.  This is to be taken once daily.  Please follow-up with Dr. Burnett Sheng as soon as you can.  There is also information attached about esophagitis for you to review.

## 2023-04-25 NOTE — ED Provider Triage Note (Signed)
Emergency Medicine Provider Triage Evaluation Note  Gerald Ellis , a 57 y.o. male  was evaluated in triage.  Pt complains of 3 weeks of vomiting, diarrhea and fevers.  Review of Systems  Positive: Abdominal pain, vomiting, diarrhea Negative: constipation  Physical Exam  There were no vitals taken for this visit. Gen:   Awake, no distress   Resp:  Normal effort  MSK:   Moves extremities without difficulty  Other:    Medical Decision Making  Medically screening exam initiated at 3:44 PM.  Appropriate orders placed.  Garret Reddish was informed that the remainder of the evaluation will be completed by another provider, this initial triage assessment does not replace that evaluation, and the importance of remaining in the ED until their evaluation is complete.     Cameron Ali, PA-C 04/25/23 1636

## 2023-04-25 NOTE — ED Provider Notes (Signed)
----------------------------------------- 8:01 PM on 04/25/2023 -----------------------------------------  Blood pressure (!) 171/96, pulse (!) 111, temperature 98 F (36.7 C), resp. rate 18, SpO2 98%.  Assuming care from Dr. Roxan Hockey.  In short, Gerald Ellis is a 57 y.o. male with a chief complaint of Abdominal Pain .  Refer to the original H&P for additional details.  The current plan of care is to watch him and tolerate PO he can go home. If he continues to vomit he will be admitted for IV fluids and IV protonix.  ____________________________________________    ED Results / Procedures / Treatments   Labs (all labs ordered are listed, but only abnormal results are displayed) Labs Reviewed  COMPREHENSIVE METABOLIC PANEL - Abnormal; Notable for the following components:      Result Value   Sodium 130 (*)    Chloride 93 (*)    Glucose, Bld 342 (*)    BUN 22 (*)    Calcium 8.0 (*)    Albumin 3.4 (*)    AST 42 (*)    Total Bilirubin 1.3 (*)    All other components within normal limits  SARS CORONAVIRUS 2 BY RT PCR  LIPASE, BLOOD  CBC WITH DIFFERENTIAL/PLATELET  URINALYSIS, ROUTINE W REFLEX MICROSCOPIC      RADIOLOGY  I personally viewed and evaluated these images as part of my medical decision making, as well as reviewing the written report by the radiologist.  ED Provider Interpretation: Esophagitis.  CT ABDOMEN PELVIS W CONTRAST  Result Date: 04/25/2023 CLINICAL DATA:  Abdominal pain.  Vomiting and diarrhea for 3 weeks. EXAM: CT ABDOMEN AND PELVIS WITH CONTRAST TECHNIQUE: Multidetector CT imaging of the abdomen and pelvis was performed using the standard protocol following bolus administration of intravenous contrast. RADIATION DOSE REDUCTION: This exam was performed according to the departmental dose-optimization program which includes automated exposure control, adjustment of the mA and/or kV according to patient size and/or use of iterative reconstruction  technique. CONTRAST:  OMNIPAQUE IOHEXOL 300 MG/ML  SOLN COMPARISON:  07/23/2021 FINDINGS: Lower chest: Clear lung bases. Normal heart size without pericardial or pleural effusion. Multivessel coronary artery atherosclerosis. Hepatobiliary: Moderate hepatic steatosis and mild hepatomegaly. Developing caudate and lateral segment left liver lobe prominence. Cholecystectomy, without biliary ductal dilatation. Pancreas: Normal, without mass or ductal dilatation. Spleen: Normal in size, without focal abnormality. Adrenals/Urinary Tract: Normal adrenal glands. Normal kidneys, without hydronephrosis. Normal urinary bladder. Stomach/Bowel: Mild distal esophageal wall thickening on 14/2. Normal stomach, without wall thickening. Periampullary duodenal diverticulum. Otherwise normal small bowel. Scattered colonic diverticula.  Normal terminal ileum and appendix. Vascular/Lymphatic: Aortic atherosclerosis. No abdominopelvic adenopathy. Reproductive: Normal prostate. Other: No significant free fluid.  No free intraperitoneal air. Musculoskeletal: Lumbosacral spondylosis. IMPRESSION: 1.  No acute process in the abdomen or pelvis. 2. Hepatic steatosis and hepatomegaly. 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 3. Distal esophageal wall thickening suggests esophagitis. 4. Coronary artery atherosclerosis. Aortic Atherosclerosis (ICD10-I70.0). Electronically Signed   By: Jeronimo Greaves M.D.   On: 04/25/2023 18:51   DG Chest 2 View  Result Date: 04/25/2023 CLINICAL DATA:  Cough.  Evaluate for pneumonia. EXAM: CHEST - 2 VIEW COMPARISON:  11/06/2022 and today's abdominal CT dictated separately. FINDINGS: The heart size and mediastinal contours are within normal limits. Both lungs are clear. The visualized skeletal structures are unremarkable. IMPRESSION: No active cardiopulmonary disease. Electronically Signed   By: Jeronimo Greaves M.D.   On: 04/25/2023 18:42     PROCEDURES:  Critical Care performed: No  Procedures   MEDICATIONS  ORDERED IN  ED: Medications  ondansetron (ZOFRAN-ODT) disintegrating tablet 4 mg (4 mg Oral Given 04/25/23 1802)  ipratropium-albuterol (DUONEB) 0.5-2.5 (3) MG/3ML nebulizer solution 3 mL (3 mLs Nebulization Given 04/25/23 1802)  sodium chloride 0.9 % bolus 1,000 mL (0 mLs Intravenous Stopped 04/25/23 1934)  iohexol (OMNIPAQUE) 300 MG/ML solution 100 mL (100 mLs Intravenous Contrast Given 04/25/23 1829)  pantoprazole (PROTONIX) injection 40 mg (40 mg Intravenous Given 04/25/23 1932)     IMPRESSION / MDM / ASSESSMENT AND PLAN / ED COURSE  I reviewed the triage vital signs and the nursing notes.                              Differential diagnosis includes, but is not limited to, esophagitis.  Patient's presentation is most consistent with acute complicated illness / injury requiring diagnostic workup.  Patient reports that he is feeling better after receiving the medications, he has been able to tolerate p.o.  Patient states he is ready to go home.  Patient's diagnosis is consistent with esophagitis. Patient will be discharged home with prescriptions for Zofran and Protonix. Patient is to follow up with primary care as needed or otherwise directed. Patient is given ED precautions to return to the ED for any worsening or new symptoms.  Clinical Course as of 04/25/23 2001  Fri Apr 25, 2023  1806 Chest x-ray on my review and interpretation without evidence of edema or consolidation will wait for radiology report. [PR]  1810 Given belly pain nausea vomiting diarrhea will order CT imaging to evaluate for acute intra-abdominal process.  No significant leukocytosis.  Lipase normal.  He is hyperglycemic but consistent with previous lab results no acidosis or elevated anion gap. [PR]  1920 Patient reassessed.  Feeling significantly improved after antiemetic.  Will trial p.o. challenge.  If able to tolerate p.o. I think be reasonable for him to be discharged on antiemetics as well as Protonix given findings  of esophagitis but the patient still having trouble keeping things down will need to be admitted for IV fluids symptomatic management IV Protonix.  Patient is agreeable plan.  On repeat lung exam he is got good air movement.  Did not note any significant change after nebulizer treatment.  His breathing is at his baseline. [PR]    Clinical Course User Index [PR] Willy Eddy, MD    FINAL CLINICAL IMPRESSION(S) / ED DIAGNOSES   Final diagnoses:  Esophagitis     Rx / DC Orders   ED Discharge Orders          Ordered    ondansetron (ZOFRAN-ODT) 4 MG disintegrating tablet  Every 8 hours PRN        04/25/23 2000    pantoprazole (PROTONIX) 40 MG tablet  Daily        04/25/23 2000             Note:  This document was prepared using Dragon voice recognition software and may include unintentional dictation errors.    Cameron Ali, PA-C 04/25/23 2030    Phineas Semen, MD 04/25/23 2150

## 2023-04-25 NOTE — ED Provider Notes (Signed)
[facility] Provider Note    Event Date/Time   First MD Initiated Contact with Patient 04/25/23 1737     (approximate)   History   Abdominal Pain   HPI  Gerald Ellis is a 57 y.o. male extensive past medical history including asthma bronchitis presents to the ER for evaluation of several days of intractable nausea vomiting productive cough congestion now having right sided abdominal pain unable to keep anything down for the past 2 days and started to feel weak secondary to that.  No recent antibiotics.  Denies any chest pain.     Physical Exam   Triage Vital Signs: ED Triage Vitals [04/25/23 1546]  Encounter Vitals Group     BP (!) 171/96     Systolic BP Percentile      Diastolic BP Percentile      Pulse Rate (!) 111     Resp 18     Temp 98 F (36.7 C)     Temp src      SpO2 98 %     Weight      Height      Head Circumference      Peak Flow      Pain Score 8     Pain Loc      Pain Education      Exclude from Growth Chart     Most recent vital signs: Vitals:   04/25/23 1546  BP: (!) 171/96  Pulse: (!) 111  Resp: 18  Temp: 98 F (36.7 C)  SpO2: 98%     Constitutional: Alert  Eyes: Conjunctivae are normal.  Head: Atraumatic. Nose: No congestion/rhinnorhea. Mouth/Throat: Mucous membranes are moist.   Neck: Painless ROM.  Cardiovascular:   Good peripheral circulation. Respiratory: Normal respiratory effort.  Frequent bronchitic sounding cough scattered rhonchi and wheeze heard posteriorly. Gastrointestinal: Soft with quite a bit of tenderness in the right side.  No guarding. Musculoskeletal:  no deformity Neurologic:  MAE spontaneously. No gross focal neurologic deficits are appreciated.  Skin:  Skin is warm, dry and intact. No rash noted. Psychiatric: Mood and affect are normal. Speech and behavior are normal.    ED Results / Procedures / Treatments   Labs (all labs ordered are listed, but only abnormal results are  displayed) Labs Reviewed  COMPREHENSIVE METABOLIC PANEL - Abnormal; Notable for the following components:      Result Value   Sodium 130 (*)    Chloride 93 (*)    Glucose, Bld 342 (*)    BUN 22 (*)    Calcium 8.0 (*)    Albumin 3.4 (*)    AST 42 (*)    Total Bilirubin 1.3 (*)    All other components within normal limits  SARS CORONAVIRUS 2 BY RT PCR  LIPASE, BLOOD  CBC WITH DIFFERENTIAL/PLATELET  URINALYSIS, ROUTINE W REFLEX MICROSCOPIC     EKG  RADIOLOGY Please see ED Course for my review and interpretation.  I personally reviewed all radiographic images ordered to evaluate for the above acute complaints and reviewed radiology reports and findings.  These findings were personally discussed with the patient.  Please see medical record for radiology report.    PROCEDURES:  Critical Care performed:   Procedures   MEDICATIONS ORDERED IN ED: Medications  ondansetron (ZOFRAN-ODT) disintegrating tablet 4 mg (4 mg Oral Given 04/25/23 1802)  ipratropium-albuterol (DUONEB) 0.5-2.5 (3) MG/3ML nebulizer solution 3 mL (3 mLs Nebulization Given 04/25/23 1802)  sodium chloride 0.9 %  bolus 1,000 mL (0 mLs Intravenous Stopped 04/25/23 1934)  iohexol (OMNIPAQUE) 300 MG/ML solution 100 mL (100 mLs Intravenous Contrast Given 04/25/23 1829)  pantoprazole (PROTONIX) injection 40 mg (40 mg Intravenous Given 04/25/23 1932)     IMPRESSION / MDM / ASSESSMENT AND PLAN / ED COURSE  I reviewed the triage vital signs and the nursing notes.                              Differential diagnosis includes, but is not limited to, esophagitis, enteritis, sbo, copd, pna,   Patient presenting to the ER for evaluation of symptoms as described above.  Based on symptoms, risk factors and considered above differential, this presenting complaint could reflect a potentially life-threatening illness therefore the patient will be placed on continuous pulse oximetry and telemetry for monitoring.  Laboratory  evaluation will be sent to evaluate for the above complaints.     Clinical Course as of 04/25/23 2008  Fri Apr 25, 2023  1806 Chest x-ray on my review and interpretation without evidence of edema or consolidation will wait for radiology report. [PR]  1810 Given belly pain nausea vomiting diarrhea will order CT imaging to evaluate for acute intra-abdominal process.  No significant leukocytosis.  Lipase normal.  He is hyperglycemic but consistent with previous lab results no acidosis or elevated anion gap. [PR]  1920 Patient reassessed.  Feeling significantly improved after antiemetic.  Will trial p.o. challenge.  If able to tolerate p.o. I think be reasonable for him to be discharged on antiemetics as well as Protonix given findings of esophagitis but the patient still having trouble keeping things down will need to be admitted for IV fluids symptomatic management IV Protonix.  Patient is agreeable plan.  On repeat lung exam he is got good air movement.  Did not note any significant change after nebulizer treatment.  His breathing is at his baseline. [PR]    Clinical Course User Index [PR] Willy Eddy, MD   Patient was signed out to oncoming provider.  Anticipate will be p.o. challenge and observation.  If patient is tolerating p.o. and continues to feel well as he does right now we do think he be appropriate for outpatient follow-up.  If the patient has any additional vomiting or not tolerating p.o. will need admission for IV fluids IV Protonix and treatment of esophagitis.  Patient agreeable plan. FINAL CLINICAL IMPRESSION(S) / ED DIAGNOSES   Final diagnoses:  Esophagitis     Rx / DC Orders   ED Discharge Orders          Ordered    ondansetron (ZOFRAN-ODT) 4 MG disintegrating tablet  Every 8 hours PRN        04/25/23 2000    pantoprazole (PROTONIX) 40 MG tablet  Daily        04/25/23 2000             Note:  This document was prepared using Dragon voice recognition software  and may include unintentional dictation errors.    Willy Eddy, MD 04/25/23 2008

## 2023-04-25 NOTE — ED Triage Notes (Signed)
Pt comes with c/o belly pain, vomiting and diarrhea for about 3 weeks. Pt states acid throwing up.

## 2023-04-28 DIAGNOSIS — M62830 Muscle spasm of back: Secondary | ICD-10-CM | POA: Diagnosis not present

## 2023-04-28 DIAGNOSIS — M1990 Unspecified osteoarthritis, unspecified site: Secondary | ICD-10-CM | POA: Diagnosis not present

## 2023-04-28 DIAGNOSIS — M545 Low back pain, unspecified: Secondary | ICD-10-CM | POA: Diagnosis not present

## 2023-04-28 DIAGNOSIS — G894 Chronic pain syndrome: Secondary | ICD-10-CM | POA: Diagnosis not present

## 2023-04-28 DIAGNOSIS — R202 Paresthesia of skin: Secondary | ICD-10-CM | POA: Diagnosis not present

## 2023-04-28 DIAGNOSIS — Z79891 Long term (current) use of opiate analgesic: Secondary | ICD-10-CM | POA: Diagnosis not present

## 2023-04-28 DIAGNOSIS — M5030 Other cervical disc degeneration, unspecified cervical region: Secondary | ICD-10-CM | POA: Diagnosis not present

## 2023-04-30 DIAGNOSIS — R112 Nausea with vomiting, unspecified: Secondary | ICD-10-CM | POA: Diagnosis not present

## 2023-04-30 DIAGNOSIS — R059 Cough, unspecified: Secondary | ICD-10-CM | POA: Diagnosis not present

## 2023-04-30 DIAGNOSIS — R1110 Vomiting, unspecified: Secondary | ICD-10-CM | POA: Diagnosis not present

## 2023-04-30 DIAGNOSIS — K209 Esophagitis, unspecified without bleeding: Secondary | ICD-10-CM | POA: Diagnosis not present

## 2023-05-07 ENCOUNTER — Other Ambulatory Visit: Payer: Self-pay

## 2023-05-07 ENCOUNTER — Emergency Department: Payer: PPO

## 2023-05-07 ENCOUNTER — Inpatient Hospital Stay: Payer: PPO

## 2023-05-07 ENCOUNTER — Inpatient Hospital Stay
Admission: EM | Admit: 2023-05-07 | Discharge: 2023-05-26 | DRG: 853 | Disposition: A | Discharge status: Skilled Nursing Facility | Payer: PPO | Attending: Internal Medicine | Admitting: Internal Medicine

## 2023-05-07 DIAGNOSIS — E11621 Type 2 diabetes mellitus with foot ulcer: Secondary | ICD-10-CM | POA: Diagnosis not present

## 2023-05-07 DIAGNOSIS — E872 Acidosis, unspecified: Secondary | ICD-10-CM | POA: Diagnosis not present

## 2023-05-07 DIAGNOSIS — E66813 Obesity, class 3: Secondary | ICD-10-CM | POA: Insufficient documentation

## 2023-05-07 DIAGNOSIS — I739 Peripheral vascular disease, unspecified: Secondary | ICD-10-CM | POA: Diagnosis not present

## 2023-05-07 DIAGNOSIS — I70235 Atherosclerosis of native arteries of right leg with ulceration of other part of foot: Secondary | ICD-10-CM | POA: Diagnosis not present

## 2023-05-07 DIAGNOSIS — E1152 Type 2 diabetes mellitus with diabetic peripheral angiopathy with gangrene: Secondary | ICD-10-CM | POA: Diagnosis present

## 2023-05-07 DIAGNOSIS — E7800 Pure hypercholesterolemia, unspecified: Secondary | ICD-10-CM | POA: Diagnosis not present

## 2023-05-07 DIAGNOSIS — M549 Dorsalgia, unspecified: Secondary | ICD-10-CM | POA: Diagnosis not present

## 2023-05-07 DIAGNOSIS — Z7984 Long term (current) use of oral hypoglycemic drugs: Secondary | ICD-10-CM | POA: Diagnosis not present

## 2023-05-07 DIAGNOSIS — E11628 Type 2 diabetes mellitus with other skin complications: Principal | ICD-10-CM | POA: Diagnosis present

## 2023-05-07 DIAGNOSIS — L089 Local infection of the skin and subcutaneous tissue, unspecified: Secondary | ICD-10-CM | POA: Diagnosis not present

## 2023-05-07 DIAGNOSIS — E1165 Type 2 diabetes mellitus with hyperglycemia: Secondary | ICD-10-CM | POA: Diagnosis present

## 2023-05-07 DIAGNOSIS — T797XXA Traumatic subcutaneous emphysema, initial encounter: Secondary | ICD-10-CM | POA: Diagnosis not present

## 2023-05-07 DIAGNOSIS — I2510 Atherosclerotic heart disease of native coronary artery without angina pectoris: Secondary | ICD-10-CM | POA: Diagnosis not present

## 2023-05-07 DIAGNOSIS — E1142 Type 2 diabetes mellitus with diabetic polyneuropathy: Secondary | ICD-10-CM | POA: Diagnosis present

## 2023-05-07 DIAGNOSIS — R278 Other lack of coordination: Secondary | ICD-10-CM | POA: Diagnosis not present

## 2023-05-07 DIAGNOSIS — E876 Hypokalemia: Secondary | ICD-10-CM | POA: Diagnosis not present

## 2023-05-07 DIAGNOSIS — Z794 Long term (current) use of insulin: Secondary | ICD-10-CM | POA: Diagnosis not present

## 2023-05-07 DIAGNOSIS — A419 Sepsis, unspecified organism: Principal | ICD-10-CM

## 2023-05-07 DIAGNOSIS — Z886 Allergy status to analgesic agent status: Secondary | ICD-10-CM

## 2023-05-07 DIAGNOSIS — Z7902 Long term (current) use of antithrombotics/antiplatelets: Secondary | ICD-10-CM

## 2023-05-07 DIAGNOSIS — L03115 Cellulitis of right lower limb: Secondary | ICD-10-CM | POA: Diagnosis present

## 2023-05-07 DIAGNOSIS — Z7952 Long term (current) use of systemic steroids: Secondary | ICD-10-CM

## 2023-05-07 DIAGNOSIS — I96 Gangrene, not elsewhere classified: Secondary | ICD-10-CM | POA: Diagnosis not present

## 2023-05-07 DIAGNOSIS — Y929 Unspecified place or not applicable: Secondary | ICD-10-CM | POA: Diagnosis not present

## 2023-05-07 DIAGNOSIS — Z83438 Family history of other disorder of lipoprotein metabolism and other lipidemia: Secondary | ICD-10-CM

## 2023-05-07 DIAGNOSIS — A480 Gas gangrene: Secondary | ICD-10-CM | POA: Diagnosis not present

## 2023-05-07 DIAGNOSIS — I700 Atherosclerosis of aorta: Secondary | ICD-10-CM | POA: Diagnosis not present

## 2023-05-07 DIAGNOSIS — I10 Essential (primary) hypertension: Secondary | ICD-10-CM | POA: Diagnosis not present

## 2023-05-07 DIAGNOSIS — N138 Other obstructive and reflux uropathy: Secondary | ICD-10-CM | POA: Diagnosis not present

## 2023-05-07 DIAGNOSIS — B9562 Methicillin resistant Staphylococcus aureus infection as the cause of diseases classified elsewhere: Secondary | ICD-10-CM | POA: Diagnosis present

## 2023-05-07 DIAGNOSIS — K219 Gastro-esophageal reflux disease without esophagitis: Secondary | ICD-10-CM | POA: Diagnosis not present

## 2023-05-07 DIAGNOSIS — Z8249 Family history of ischemic heart disease and other diseases of the circulatory system: Secondary | ICD-10-CM

## 2023-05-07 DIAGNOSIS — M96840 Postprocedural hematoma of a musculoskeletal structure following a musculoskeletal system procedure: Secondary | ICD-10-CM | POA: Diagnosis not present

## 2023-05-07 DIAGNOSIS — X58XXXA Exposure to other specified factors, initial encounter: Secondary | ICD-10-CM | POA: Diagnosis present

## 2023-05-07 DIAGNOSIS — L97519 Non-pressure chronic ulcer of other part of right foot with unspecified severity: Secondary | ICD-10-CM | POA: Diagnosis present

## 2023-05-07 DIAGNOSIS — N401 Enlarged prostate with lower urinary tract symptoms: Secondary | ICD-10-CM | POA: Diagnosis not present

## 2023-05-07 DIAGNOSIS — M869 Osteomyelitis, unspecified: Secondary | ICD-10-CM | POA: Diagnosis not present

## 2023-05-07 DIAGNOSIS — D62 Acute posthemorrhagic anemia: Secondary | ICD-10-CM | POA: Diagnosis not present

## 2023-05-07 DIAGNOSIS — G8929 Other chronic pain: Secondary | ICD-10-CM | POA: Insufficient documentation

## 2023-05-07 DIAGNOSIS — Z79891 Long term (current) use of opiate analgesic: Secondary | ICD-10-CM

## 2023-05-07 DIAGNOSIS — Z89431 Acquired absence of right foot: Secondary | ICD-10-CM | POA: Diagnosis not present

## 2023-05-07 DIAGNOSIS — R29898 Other symptoms and signs involving the musculoskeletal system: Secondary | ICD-10-CM | POA: Diagnosis not present

## 2023-05-07 DIAGNOSIS — Z833 Family history of diabetes mellitus: Secondary | ICD-10-CM

## 2023-05-07 DIAGNOSIS — Z89511 Acquired absence of right leg below knee: Secondary | ICD-10-CM | POA: Diagnosis not present

## 2023-05-07 DIAGNOSIS — Z7401 Bed confinement status: Secondary | ICD-10-CM | POA: Diagnosis not present

## 2023-05-07 DIAGNOSIS — Z95828 Presence of other vascular implants and grafts: Secondary | ICD-10-CM | POA: Diagnosis not present

## 2023-05-07 DIAGNOSIS — F419 Anxiety disorder, unspecified: Secondary | ICD-10-CM | POA: Diagnosis not present

## 2023-05-07 DIAGNOSIS — Z820 Family history of epilepsy and other diseases of the nervous system: Secondary | ICD-10-CM

## 2023-05-07 DIAGNOSIS — Z9861 Coronary angioplasty status: Secondary | ICD-10-CM | POA: Diagnosis not present

## 2023-05-07 DIAGNOSIS — Z89421 Acquired absence of other right toe(s): Secondary | ICD-10-CM | POA: Diagnosis not present

## 2023-05-07 DIAGNOSIS — M6281 Muscle weakness (generalized): Secondary | ICD-10-CM | POA: Diagnosis not present

## 2023-05-07 DIAGNOSIS — Z6841 Body Mass Index (BMI) 40.0 and over, adult: Secondary | ICD-10-CM

## 2023-05-07 DIAGNOSIS — J4540 Moderate persistent asthma, uncomplicated: Secondary | ICD-10-CM | POA: Diagnosis not present

## 2023-05-07 DIAGNOSIS — Z5986 Financial insecurity: Secondary | ICD-10-CM

## 2023-05-07 DIAGNOSIS — R2689 Other abnormalities of gait and mobility: Secondary | ICD-10-CM | POA: Diagnosis not present

## 2023-05-07 DIAGNOSIS — M7989 Other specified soft tissue disorders: Secondary | ICD-10-CM | POA: Diagnosis not present

## 2023-05-07 DIAGNOSIS — Z955 Presence of coronary angioplasty implant and graft: Secondary | ICD-10-CM

## 2023-05-07 DIAGNOSIS — Z9889 Other specified postprocedural states: Secondary | ICD-10-CM | POA: Diagnosis not present

## 2023-05-07 DIAGNOSIS — M5416 Radiculopathy, lumbar region: Secondary | ICD-10-CM | POA: Diagnosis present

## 2023-05-07 DIAGNOSIS — Z79899 Other long term (current) drug therapy: Secondary | ICD-10-CM

## 2023-05-07 DIAGNOSIS — T8131XA Disruption of external operation (surgical) wound, not elsewhere classified, initial encounter: Secondary | ICD-10-CM | POA: Diagnosis not present

## 2023-05-07 DIAGNOSIS — Z7982 Long term (current) use of aspirin: Secondary | ICD-10-CM

## 2023-05-07 LAB — COMPREHENSIVE METABOLIC PANEL
ALT: 41 U/L (ref 0–44)
AST: 36 U/L (ref 15–41)
Albumin: 2.6 g/dL — ABNORMAL LOW (ref 3.5–5.0)
Alkaline Phosphatase: 97 U/L (ref 38–126)
Anion gap: 11 (ref 5–15)
BUN: 12 mg/dL (ref 6–20)
CO2: 25 mmol/L (ref 22–32)
Calcium: 8.4 mg/dL — ABNORMAL LOW (ref 8.9–10.3)
Chloride: 94 mmol/L — ABNORMAL LOW (ref 98–111)
Creatinine, Ser: 0.98 mg/dL (ref 0.61–1.24)
GFR, Estimated: >60 mL/min (ref >60)
Glucose, Bld: 396 mg/dL — ABNORMAL HIGH (ref 70–99)
Potassium: 4.1 mmol/L (ref 3.5–5.1)
Sodium: 130 mmol/L — ABNORMAL LOW (ref 135–145)
Total Bilirubin: 0.9 mg/dL (ref <1.2)
Total Protein: 8.3 g/dL — ABNORMAL HIGH (ref 6.5–8.1)

## 2023-05-07 LAB — CBC WITH DIFFERENTIAL/PLATELET
Abs Immature Granulocytes: 0.09 10*3/uL — ABNORMAL HIGH (ref 0.00–0.07)
Basophils Absolute: 0 10*3/uL (ref 0.0–0.1)
Basophils Relative: 0 %
Eosinophils Absolute: 0.1 10*3/uL (ref 0.0–0.5)
Eosinophils Relative: 1 %
HCT: 36.3 % — ABNORMAL LOW (ref 39.0–52.0)
Hemoglobin: 12.1 g/dL — ABNORMAL LOW (ref 13.0–17.0)
Immature Granulocytes: 1 %
Lymphocytes Relative: 12 %
Lymphs Abs: 1.8 10*3/uL (ref 0.7–4.0)
MCH: 29 pg (ref 26.0–34.0)
MCHC: 33.3 g/dL (ref 30.0–36.0)
MCV: 87.1 fL (ref 80.0–100.0)
Monocytes Absolute: 0.7 10*3/uL (ref 0.1–1.0)
Monocytes Relative: 4 %
Neutro Abs: 12.3 10*3/uL — ABNORMAL HIGH (ref 1.7–7.7)
Neutrophils Relative %: 82 %
Platelets: 491 10*3/uL — ABNORMAL HIGH (ref 150–400)
RBC: 4.17 MIL/uL — ABNORMAL LOW (ref 4.22–5.81)
RDW: 12.5 % (ref 11.5–15.5)
WBC: 15 10*3/uL — ABNORMAL HIGH (ref 4.0–10.5)
nRBC: 0 % (ref 0.0–0.2)

## 2023-05-07 LAB — LACTIC ACID, PLASMA
Lactic Acid, Venous: 3.2 mmol/L (ref 0.5–1.9)
Lactic Acid, Venous: 1.5 mmol/L (ref 0.5–1.9)

## 2023-05-07 MED ORDER — INSULIN ASPART 100 UNIT/ML IJ SOLN
0.0000 [IU] | INTRAMUSCULAR | Status: DC
  Administered 2023-05-08: 11 [IU] via SUBCUTANEOUS
  Administered 2023-05-08: 7 [IU] via SUBCUTANEOUS
  Administered 2023-05-08: 15 [IU] via SUBCUTANEOUS
  Administered 2023-05-08: 3 [IU] via SUBCUTANEOUS
  Administered 2023-05-08: 11 [IU] via SUBCUTANEOUS
  Administered 2023-05-09: 7 [IU] via SUBCUTANEOUS
  Administered 2023-05-09: 11 [IU] via SUBCUTANEOUS
  Administered 2023-05-09: 4 [IU] via SUBCUTANEOUS
  Administered 2023-05-09: 15 [IU] via SUBCUTANEOUS
  Administered 2023-05-09: 7 [IU] via SUBCUTANEOUS
  Administered 2023-05-09: 11 [IU] via SUBCUTANEOUS
  Administered 2023-05-10 (×2): 7 [IU] via SUBCUTANEOUS
  Administered 2023-05-10: 4 [IU] via SUBCUTANEOUS
  Administered 2023-05-10 (×2): 7 [IU] via SUBCUTANEOUS
  Administered 2023-05-11: 3 [IU] via SUBCUTANEOUS
  Administered 2023-05-11: 11 [IU] via SUBCUTANEOUS
  Administered 2023-05-11 (×2): 4 [IU] via SUBCUTANEOUS
  Administered 2023-05-11 – 2023-05-12 (×2): 3 [IU] via SUBCUTANEOUS
  Administered 2023-05-12: 4 [IU] via SUBCUTANEOUS
  Administered 2023-05-12 (×2): 7 [IU] via SUBCUTANEOUS
  Administered 2023-05-12: 4 [IU] via SUBCUTANEOUS
  Administered 2023-05-13: 3 [IU] via SUBCUTANEOUS
  Administered 2023-05-13 (×2): 4 [IU] via SUBCUTANEOUS
  Administered 2023-05-13 (×2): 7 [IU] via SUBCUTANEOUS
  Administered 2023-05-14: 11 [IU] via SUBCUTANEOUS
  Administered 2023-05-14: 4 [IU] via SUBCUTANEOUS
  Administered 2023-05-14: 7 [IU] via SUBCUTANEOUS
  Administered 2023-05-14: 4 [IU] via SUBCUTANEOUS
  Administered 2023-05-15: 7 [IU] via SUBCUTANEOUS
  Administered 2023-05-15: 4 [IU] via SUBCUTANEOUS
  Administered 2023-05-15: 7 [IU] via SUBCUTANEOUS
  Administered 2023-05-15: 3 [IU] via SUBCUTANEOUS
  Administered 2023-05-16: 11 [IU] via SUBCUTANEOUS
  Administered 2023-05-16: 7 [IU] via SUBCUTANEOUS
  Administered 2023-05-16: 4 [IU] via SUBCUTANEOUS
  Administered 2023-05-17: 7 [IU] via SUBCUTANEOUS
  Administered 2023-05-17 (×2): 3 [IU] via SUBCUTANEOUS
  Administered 2023-05-17: 4 [IU] via SUBCUTANEOUS
  Administered 2023-05-17: 11 [IU] via SUBCUTANEOUS
  Administered 2023-05-17: 7 [IU] via SUBCUTANEOUS
  Administered 2023-05-18 (×2): 3 [IU] via SUBCUTANEOUS
  Administered 2023-05-18: 4 [IU] via SUBCUTANEOUS
  Administered 2023-05-18: 3 [IU] via SUBCUTANEOUS
  Administered 2023-05-18: 7 [IU] via SUBCUTANEOUS
  Administered 2023-05-18 – 2023-05-19 (×2): 11 [IU] via SUBCUTANEOUS
  Administered 2023-05-19: 3 [IU] via SUBCUTANEOUS
  Administered 2023-05-19: 4 [IU] via SUBCUTANEOUS
  Administered 2023-05-19: 7 [IU] via SUBCUTANEOUS
  Administered 2023-05-19 – 2023-05-20 (×2): 4 [IU] via SUBCUTANEOUS
  Administered 2023-05-20 (×2): 7 [IU] via SUBCUTANEOUS
  Administered 2023-05-20: 3 [IU] via SUBCUTANEOUS
  Administered 2023-05-20: 4 [IU] via SUBCUTANEOUS
  Administered 2023-05-21: 7 [IU] via SUBCUTANEOUS
  Administered 2023-05-21 (×3): 4 [IU] via SUBCUTANEOUS
  Administered 2023-05-21: 11 [IU] via SUBCUTANEOUS
  Administered 2023-05-22: 3 [IU] via SUBCUTANEOUS
  Administered 2023-05-22 (×2): 7 [IU] via SUBCUTANEOUS
  Administered 2023-05-22: 4 [IU] via SUBCUTANEOUS
  Administered 2023-05-22: 3 [IU] via SUBCUTANEOUS
  Administered 2023-05-22: 4 [IU] via SUBCUTANEOUS
  Administered 2023-05-23: 11 [IU] via SUBCUTANEOUS
  Administered 2023-05-23: 4 [IU] via SUBCUTANEOUS
  Administered 2023-05-23: 7 [IU] via SUBCUTANEOUS
  Administered 2023-05-23 – 2023-05-24 (×2): 4 [IU] via SUBCUTANEOUS
  Administered 2023-05-24: 15 [IU] via SUBCUTANEOUS
  Administered 2023-05-24 (×2): 7 [IU] via SUBCUTANEOUS
  Administered 2023-05-24: 11 [IU] via SUBCUTANEOUS
  Administered 2023-05-24 – 2023-05-25 (×2): 3 [IU] via SUBCUTANEOUS
  Administered 2023-05-25: 11 [IU] via SUBCUTANEOUS
  Administered 2023-05-25 (×2): 4 [IU] via SUBCUTANEOUS
  Administered 2023-05-25: 7 [IU] via SUBCUTANEOUS
  Administered 2023-05-25 – 2023-05-26 (×2): 3 [IU] via SUBCUTANEOUS
  Administered 2023-05-26 (×3): 7 [IU] via SUBCUTANEOUS
  Filled 2023-05-07 (×92): qty 1

## 2023-05-07 MED ORDER — ISOSORBIDE MONONITRATE ER 30 MG PO TB24
30.0000 mg | ORAL_TABLET | Freq: Every day | ORAL | Status: DC
  Administered 2023-05-09 – 2023-05-26 (×18): 30 mg via ORAL
  Filled 2023-05-07 (×18): qty 1

## 2023-05-07 MED ORDER — ATORVASTATIN CALCIUM 80 MG PO TABS
80.0000 mg | ORAL_TABLET | Freq: Every day | ORAL | Status: DC
  Administered 2023-05-08 – 2023-05-25 (×17): 80 mg via ORAL
  Filled 2023-05-07: qty 4
  Filled 2023-05-07 (×5): qty 1
  Filled 2023-05-07 (×2): qty 4
  Filled 2023-05-07: qty 1
  Filled 2023-05-07 (×2): qty 4
  Filled 2023-05-07: qty 1
  Filled 2023-05-07 (×5): qty 4
  Filled 2023-05-07 (×2): qty 1

## 2023-05-07 MED ORDER — ONDANSETRON HCL 4 MG PO TABS: 4.0000 mg | ORAL_TABLET | Freq: Four times a day (QID) | ORAL | Status: DC | PRN

## 2023-05-07 MED ORDER — VANCOMYCIN HCL IN DEXTROSE 1-5 GM/200ML-% IV SOLN: 1000.0000 mg | Freq: Once | INTRAVENOUS | Status: DC

## 2023-05-07 MED ORDER — VANCOMYCIN HCL 1.5 G IV SOLR
1500.0000 mg | Freq: Once | INTRAVENOUS | Status: AC
  Administered 2023-05-08: 1500 mg via INTRAVENOUS
  Filled 2023-05-07: qty 30

## 2023-05-07 MED ORDER — HYDROCODONE-ACETAMINOPHEN 5-325 MG PO TABS
1.0000 | ORAL_TABLET | ORAL | Status: DC | PRN
  Filled 2023-05-07: qty 1

## 2023-05-07 MED ORDER — MORPHINE SULFATE (PF) 2 MG/ML IV SOLN
2.0000 mg | INTRAVENOUS | Status: DC | PRN
  Administered 2023-05-08 – 2023-05-15 (×5): 2 mg via INTRAVENOUS
  Filled 2023-05-07 (×5): qty 1

## 2023-05-07 MED ORDER — SODIUM CHLORIDE 0.9 % IV SOLN
2.0000 g | Freq: Three times a day (TID) | INTRAVENOUS | Status: DC
  Administered 2023-05-08 – 2023-05-13 (×16): 2 g via INTRAVENOUS
  Filled 2023-05-07 (×19): qty 12.5

## 2023-05-07 MED ORDER — CEFEPIME HCL 2 G IV SOLR: 2.0000 g | Freq: Once | INTRAVENOUS | Status: DC

## 2023-05-07 MED ORDER — VANCOMYCIN HCL IN DEXTROSE 1-5 GM/200ML-% IV SOLN
1000.0000 mg | Freq: Two times a day (BID) | INTRAVENOUS | Status: DC
  Administered 2023-05-08 – 2023-05-11 (×7): 1000 mg via INTRAVENOUS
  Filled 2023-05-07 (×7): qty 200

## 2023-05-07 MED ORDER — ASPIRIN 81 MG PO CHEW
81.0000 mg | CHEWABLE_TABLET | Freq: Every day | ORAL | Status: DC
  Administered 2023-05-09 – 2023-05-26 (×18): 81 mg via ORAL
  Filled 2023-05-07 (×19): qty 1

## 2023-05-07 MED ORDER — IPRATROPIUM-ALBUTEROL 0.5-2.5 (3) MG/3ML IN SOLN: 3.0000 mL | Freq: Four times a day (QID) | RESPIRATORY_TRACT | Status: DC | PRN

## 2023-05-07 MED ORDER — ACETAMINOPHEN 325 MG PO TABS: 650.0000 mg | ORAL_TABLET | Freq: Four times a day (QID) | ORAL | Status: DC | PRN

## 2023-05-07 MED ORDER — OMEGA-3-ACID ETHYL ESTERS 1 G PO CAPS
1.0000 g | ORAL_CAPSULE | Freq: Two times a day (BID) | ORAL | Status: DC
  Administered 2023-05-08 – 2023-05-26 (×36): 1 g via ORAL
  Filled 2023-05-07 (×36): qty 1

## 2023-05-07 MED ORDER — ACETAMINOPHEN 650 MG RE SUPP: 650.0000 mg | Freq: Four times a day (QID) | RECTAL | Status: DC | PRN

## 2023-05-07 MED ORDER — SODIUM CHLORIDE 0.9 % IV BOLUS (SEPSIS)
1000.0000 mL | Freq: Once | INTRAVENOUS | Status: AC
  Administered 2023-05-07: 1000 mL via INTRAVENOUS

## 2023-05-07 MED ORDER — VANCOMYCIN HCL IN DEXTROSE 1-5 GM/200ML-% IV SOLN
1000.0000 mg | Freq: Once | INTRAVENOUS | Status: AC
  Administered 2023-05-07: 1000 mg via INTRAVENOUS
  Filled 2023-05-07: qty 200

## 2023-05-07 MED ORDER — KETOROLAC TROMETHAMINE 30 MG/ML IJ SOLN
30.0000 mg | Freq: Four times a day (QID) | INTRAMUSCULAR | Status: AC | PRN
  Administered 2023-05-08: 30 mg via INTRAVENOUS

## 2023-05-07 MED ORDER — PANTOPRAZOLE SODIUM 40 MG PO TBEC
40.0000 mg | DELAYED_RELEASE_TABLET | Freq: Two times a day (BID) | ORAL | Status: DC
  Administered 2023-05-08 – 2023-05-26 (×36): 40 mg via ORAL
  Filled 2023-05-07 (×36): qty 1

## 2023-05-07 MED ORDER — BUSPIRONE HCL 15 MG PO TABS
7.5000 mg | ORAL_TABLET | Freq: Two times a day (BID) | ORAL | Status: DC
  Administered 2023-05-08 – 2023-05-26 (×36): 7.5 mg via ORAL
  Filled 2023-05-07 (×30): qty 1
  Filled 2023-05-07: qty 1.5
  Filled 2023-05-07 (×7): qty 1

## 2023-05-07 MED ORDER — METRONIDAZOLE 500 MG/100ML IV SOLN
500.0000 mg | Freq: Two times a day (BID) | INTRAVENOUS | Status: DC
  Administered 2023-05-08: 500 mg via INTRAVENOUS
  Filled 2023-05-07 (×2): qty 100

## 2023-05-07 MED ORDER — CYCLOBENZAPRINE HCL 10 MG PO TABS
10.0000 mg | ORAL_TABLET | Freq: Three times a day (TID) | ORAL | Status: DC | PRN
  Administered 2023-05-11 – 2023-05-17 (×3): 10 mg via ORAL
  Filled 2023-05-07 (×4): qty 1

## 2023-05-07 MED ORDER — METOPROLOL SUCCINATE ER 50 MG PO TB24
50.0000 mg | ORAL_TABLET | Freq: Every day | ORAL | Status: DC
  Administered 2023-05-09 – 2023-05-26 (×18): 50 mg via ORAL
  Filled 2023-05-07 (×19): qty 1

## 2023-05-07 MED ORDER — ONDANSETRON HCL 4 MG/2ML IJ SOLN
4.0000 mg | Freq: Four times a day (QID) | INTRAMUSCULAR | Status: DC | PRN
  Administered 2023-05-08: 4 mg via INTRAVENOUS
  Filled 2023-05-07: qty 2

## 2023-05-07 MED ORDER — LISINOPRIL 10 MG PO TABS: 20.0000 mg | ORAL_TABLET | Freq: Every day | ORAL | Status: DC

## 2023-05-07 MED ORDER — PIPERACILLIN-TAZOBACTAM 3.375 G IVPB 30 MIN
3.3750 g | Freq: Once | INTRAVENOUS | Status: AC
  Administered 2023-05-07: 3.375 g via INTRAVENOUS
  Filled 2023-05-07 (×2): qty 50

## 2023-05-07 MED ORDER — LACTATED RINGERS IV SOLN
150.0000 mL/h | INTRAVENOUS | Status: AC
  Administered 2023-05-08: 150 mL/h via INTRAVENOUS

## 2023-05-07 NOTE — Assessment & Plan Note (Signed)
Continue atorvastatin Getting vascular consult

## 2023-05-07 NOTE — Assessment & Plan Note (Signed)
BMI 33 but with comorbidities Complicating factor to overall prognosis

## 2023-05-07 NOTE — Assessment & Plan Note (Signed)
-   Continue BuSpar 

## 2023-05-07 NOTE — Progress Notes (Signed)
Pharmacy Antibiotic Note  Gerald Ellis is a 57 y.o. male admitted on 05/07/2023 with  diabetic wound infection .  Pharmacy has been consulted for Vancomycin and Cefepime dosing.  Plan: Cefepime 2 gm IV Q8H  Vancomycin total loading dose of 2500 mg, then Vancomycin 1 gm IV Q12H ordered to start on 12/19 @ 1000. Est AUC 457  Scr used 0.91 estCmin 12.1 Also on metronidazole per MD orders Pharmacy will continue to follow renal function, cultures and clinical progress to adjust therapy as needed.    Height: 5\' 10"  (177.8 cm) Weight: 106.6 kg (235 lb) IBW/kg (Calculated) : 73  Temp (24hrs), Avg:99 F (37.2 C), Min:99 F (37.2 C), Max:99 F (37.2 C)  Recent Labs  Lab 05/07/23 1921 05/07/23 2142  WBC 15.0*  --   CREATININE 0.98  --   LATICACIDVEN 3.2* 1.5    Estimated Creatinine Clearance: 102.9 mL/min (by C-G formula based on SCr of 0.98 mg/dL).    Allergies  Allergen Reactions   Ibuprofen Shortness Of Breath   Nsaids Swelling    Swelling of throat    Antimicrobials this admission: Zosyn 12/18 x 1 doses Cefepime 12/19>> Metronidazole 12/19>> Vancomycin 12/18  Dose adjustments this admission: n/a   Microbiology results: 12/18 BCx: NG < 12hrs  Thank you for allowing pharmacy to be a part of this patient's care.  Dillie Burandt Rodriguez-Guzman PharmD, BCPS 05/08/2023 9:10 AM

## 2023-05-07 NOTE — H&P (Signed)
History and Physical    Patient: Gerald Ellis:[id number] DOB: 07-01-1965 DOA: 05/07/2023 DOS: the patient was seen and examined on 05/07/2023 PCP: Jerl Mina, MD  Patient coming from: Home  Chief Complaint:  Chief Complaint  Patient presents with   Foot Pain    HPI: Gerald Ellis is a 57 y.o. male with medical history significant for HTN, HLD, CAD s/p stent, DM with neuropathy, chronic back pain on opiates,, anxiety, chronic ulcer first right MTP, followed by podiatry (last seen 02/18/2023), a with normal resting ABI at the time and treated with topical dressings who presents to the ED with a 1 week history of blistering in the area of the wound and then over the past day he noted that his second and third toes of the right foot were turning black.  He denies fever or chills. ED course and data review: Borderline sepsis criteria with Tmax 99 pulse 102, BP 145/88:Pertinent findings on workup as follows: WBC 15,000 with lactic acid 3.2 Hemoglobin 12.1 Glucose 396 and sodium 130 Right foot x-ray showing necrotic soft tissue infection with gas present within the soft tissues and medial first digit as follows: IMPRESSION: Wound or ulcer plantar aspect of the distal foot with gas within the soft tissues diffusely surrounding the second digit and additional gas present within the soft tissues of the medial first digit, between the second and third digits, and between the fourth and fifth digits. Findings are suspicious for necrotic soft tissue infection. No definite acute osseous abnormality.  Patient started on Zosyn and vancomycin Hospitalist consulted for admission.   Review of Systems: As mentioned in the history of present illness. All other systems reviewed and are negative.  Past Medical History:  Diagnosis Date   Benign enlargement of prostate    Bronchitis    Childhood asthma    Diabetes mellitus without complication (HCC)    Environmental allergies     Erectile dysfunction    Headache    Hemorrhoids    Hypercholesteremia    Hypertension    Hypogonadism in male    Lumbago    Over weight    Past Surgical History:  Procedure Laterality Date   BACK SURGERY  1992   CHOLECYSTECTOMY     COLONOSCOPY WITH PROPOFOL N/A 08/07/2015   Procedure: COLONOSCOPY WITH PROPOFOL;  Surgeon: Christena Deem, MD;  Location: [facility] ENDOSCOPY;  Service: Endoscopy;  Laterality: N/A;   COLONOSCOPY WITH PROPOFOL N/A 08/08/2015   Procedure: COLONOSCOPY WITH PROPOFOL;  Surgeon: Christena Deem, MD;  Location: [facility] ENDOSCOPY;  Service: Endoscopy;  Laterality: N/A;   COLONOSCOPY WITH PROPOFOL N/A 09/16/2017   Procedure: COLONOSCOPY WITH PROPOFOL;  Surgeon: Christena Deem, MD;  Location: [facility] ENDOSCOPY;  Service: Endoscopy;  Laterality: N/A;   COLONOSCOPY WITH PROPOFOL N/A 12/20/2020   Procedure: COLONOSCOPY WITH PROPOFOL;  Surgeon: Earline Mayotte, MD;  Location: [facility] ENDOSCOPY;  Service: Endoscopy;  Laterality: N/A;  IDDM   CORONARY STENT INTERVENTION N/A 04/20/2019   Procedure: CORONARY STENT INTERVENTION;  Surgeon: Marcina Millard, MD;  Location: [facility] INVASIVE CV LAB;  Service: Cardiovascular;  Laterality: N/A;   ESOPHAGOGASTRODUODENOSCOPY (EGD) WITH PROPOFOL N/A 08/07/2015   Procedure: ESOPHAGOGASTRODUODENOSCOPY (EGD) WITH PROPOFOL;  Surgeon: Christena Deem, MD;  Location: [facility] ENDOSCOPY;  Service: Endoscopy;  Laterality: N/A;   ESOPHAGOGASTRODUODENOSCOPY (EGD) WITH PROPOFOL N/A 09/16/2017   Procedure: ESOPHAGOGASTRODUODENOSCOPY (EGD) WITH PROPOFOL;  Surgeon: Christena Deem, MD;  Location: [facility] ENDOSCOPY;  Service: Endoscopy;  Laterality: N/A;   ESOPHAGOGASTRODUODENOSCOPY (EGD) WITH  PROPOFOL N/A 12/20/2020   Procedure: ESOPHAGOGASTRODUODENOSCOPY (EGD) WITH PROPOFOL;  Surgeon: Earline Mayotte, MD;  Location: [facility] ENDOSCOPY;  Service: Endoscopy;  Laterality: N/A;   LEFT HEART CATH AND CORONARY ANGIOGRAPHY Left 04/20/2019   Procedure: LEFT HEART CATH  AND CORONARY ANGIOGRAPHY;  Surgeon: Marcina Millard, MD;  Location: [facility] INVASIVE CV LAB;  Service: Cardiovascular;  Laterality: Left;   Social History:  reports that he has never smoked. He has never used smokeless tobacco. He reports current alcohol use. He reports that he does not use drugs.  Allergies  Allergen Reactions   Ibuprofen Shortness Of Breath   Nsaids Swelling    Swelling of throat    Family History  Problem Relation Age of Onset   Heart disease Mother        CABG   Lung cancer Mother        Disseminated   Hypertension Mother    Hyperlipidemia Mother    Diabetes Father        mother   Colon cancer Father 17   Cancer Maternal Aunt        x3 aunts, unk types of cancer   Colon cancer Maternal Uncle        x3 uncles   Prostate cancer Maternal Uncle        x4 uncles   Cancer Paternal Uncle        prostate vs colon   Alzheimer's disease Other    Kidney disease Neg Hx     Prior to Admission medications   Medication Sig Start Date End Date Taking? Authorizing Provider  amLODipine (NORVASC) 5 MG tablet Take 1 tablet (5 mg total) by mouth daily. 04/21/19   Leanora Ivanoff, PA-C  aspirin 81 MG chewable tablet Chew 1 tablet (81 mg total) by mouth daily. 04/21/19   Leanora Ivanoff, PA-C  atorvastatin (LIPITOR) 80 MG tablet Take 1 tablet (80 mg total) by mouth daily at 6 PM. 04/21/19   Leanora Ivanoff, PA-C  busPIRone (BUSPAR) 7.5 MG tablet Take 7.5 mg by mouth 2 (two) times daily.    [provider]  cetirizine (ZYRTEC) 10 MG tablet Take 10 mg by mouth daily.    [provider]  chlorpheniramine-HYDROcodone (TUSSIONEX) 10-8 MG/5ML Take 5 mLs by mouth every 12 (twelve) hours as needed for cough. 11/06/22   Irean Hong, MD  cyclobenzaprine (FLEXERIL) 10 MG tablet TAKE 1/2 TO 1 (ONE-HALF TO ONE) TABLET BY MOUTH PER DAY OR TWICE DAILY IF TOLERATED. 06/08/20   [provider]  diclofenac Sodium (VOLTAREN) 1 % GEL Apply 2 g topically 4 (four) times daily.     [provider]  fluticasone (FLONASE) 50 MCG/ACT nasal spray Place 1 spray into both nostrils 2 (two) times daily as needed (nasal congestion).    [provider]  Fluticasone-Umeclidin-Vilant 200-62.5-25 MCG/INH AEPB Inhale into the lungs. 04/24/20   [provider]  HYDROcodone-acetaminophen (NORCO/VICODIN) 5-325 MG tablet Limit 1 tab by mouth per day or twice a day if tolerated Patient taking differently: Take 0.5-1 tablets by mouth every 4 (four) hours as needed (pain.). 01/04/16   Ewing Schlein, MD  insulin regular human CONCENTRATED (HUMULIN R U-500 KWIKPEN) 500 UNIT/ML kwikpen Inject 30 Units into the skin 2 (two) times daily.    [provider]  Ipratropium-Albuterol (COMBIVENT) 20-100 MCG/ACT AERS respimat Inhale into the lungs. 12/15/19 12/14/20  [provider]  isosorbide mononitrate (IMDUR) 30 MG 24 hr tablet Take 30 mg by mouth daily.    [provider]  lisinopril (ZESTRIL) 20 MG tablet Take 1 tablet (20 mg total) by mouth daily. 04/21/19   Leanora Ivanoff, PA-C  metFORMIN (GLUCOPHAGE) 1000 MG tablet Take 1,000 mg by mouth 2 (two) times daily.     [provider]  metoCLOPramide (REGLAN) 5 MG tablet Take 5 mg by mouth 2 (two) times daily.    [provider]  metoprolol succinate (TOPROL-XL) 50 MG 24 hr tablet Take 50 mg by mouth daily. Take with or immediately following a meal.    [provider]  montelukast (SINGULAIR) 10 MG tablet Take by mouth. 12/15/19 12/14/20  [provider]  Multiple Vitamins-Minerals (MULTIVITAMIN WITH MINERALS) tablet Take 1 tablet by mouth 2 (two) times daily.     [provider]  mupirocin ointment (BACTROBAN) 2 % SMARTSIG:1 Application Topical 2-3 Times Daily 04/06/20   [provider]  Omega-3 Fatty Acids (FISH OIL) 500 MG CAPS Take 500 mg by mouth 2 (two) times daily.    [provider]  ondansetron (ZOFRAN-ODT) 4 MG disintegrating tablet Take 1  tablet (4 mg total) by mouth every 8 (eight) hours as needed for nausea or vomiting. 04/25/23   Willy Eddy, MD  pantoprazole (PROTONIX) 40 MG tablet Take 40 mg by mouth 2 (two) times daily.  04/07/15   [provider]  pantoprazole (PROTONIX) 40 MG tablet Take 1 tablet (40 mg total) by mouth daily. 04/25/23 04/24/24  Willy Eddy, MD  pioglitazone (ACTOS) 15 MG tablet Take 15 mg by mouth 2 (two) times daily.    [provider]  prasugrel (EFFIENT) 10 MG TABS tablet Take 1 tablet (10 mg total) by mouth daily. 04/21/19   Leanora Ivanoff, PA-C  predniSONE (DELTASONE) 20 MG tablet 3 tablets daily x 4 days 11/06/22   Irean Hong, MD  silver sulfADIAZINE (SILVADENE) 1 % cream Apply 1 application topically daily. 05/30/20   Schnier, Latina Craver, MD  tadalafil (CIALIS) 20 MG tablet Take 20 mg by mouth daily as needed for erectile dysfunction.    [provider]    Physical Exam: Vitals:   05/07/23 1918 05/07/23 1919 05/07/23 2010  BP:  (!) 145/88   Pulse:  (!) 102   Resp:  20   Temp:  99 F (37.2 C)   TempSrc:  Oral   SpO2:  99% 95%  Weight: 106.6 kg    Height: 5\' 10"  (1.778 m)     Physical Exam Vitals and nursing note reviewed.  Constitutional:      General: He is not in acute distress. HENT:     Head: Normocephalic and atraumatic.  Cardiovascular:     Rate and Rhythm: Regular rhythm. Tachycardia present.     Pulses:          Dorsalis pedis pulses are 1+ on the right side and 2+ on the left side.     Heart sounds: Normal heart sounds.  Pulmonary:     Effort: Pulmonary effort is normal.     Breath sounds: Normal breath sounds.  Abdominal:     Palpations: Abdomen is soft.     Tenderness: There is no abdominal tenderness.  Musculoskeletal:     Comments: See photo below  Neurological:     Mental Status: Mental status is at baseline.        Labs on Admission: I have personally reviewed following labs and imaging studies  CBC: Recent Labs  Lab  05/07/23 1921  WBC 15.0*  NEUTROABS 12.3*  HGB 12.1*  HCT 36.3*  MCV 87.1  PLT 491*   Basic Metabolic Panel: Recent Labs  Lab 05/07/23 1921  NA 130*  K 4.1  CL 94*  CO2 25  GLUCOSE 396*  BUN 12  CREATININE 0.98  CALCIUM 8.4*   GFR: Estimated Creatinine Clearance: 102.9 mL/min (by C-G formula based on SCr of 0.98 mg/dL). Liver Function Tests: Recent Labs  Lab 05/07/23 1921  AST 36  ALT 41  ALKPHOS 97  BILITOT 0.9  PROT 8.3*  ALBUMIN 2.6*   No results for input(s): "LIPASE", "AMYLASE" in the last 168 hours. No results for input(s): "AMMONIA" in the last 168 hours. Coagulation Profile: No results for input(s): "INR", "PROTIME" in the last 168 hours. Cardiac Enzymes: No results for input(s): "CKTOTAL", "CKMB", "CKMBINDEX", "TROPONINI" in the last 168 hours. BNP (last 3 results) No results for input(s): "PROBNP" in the last 8760 hours. HbA1C: No results for input(s): "HGBA1C" in the last 72 hours. CBG: No results for input(s): "GLUCAP" in the last 168 hours. Lipid Profile: No results for input(s): "CHOL", "HDL", "LDLCALC", "TRIG", "CHOLHDL", "LDLDIRECT" in the last 72 hours. Thyroid Function Tests: No results for input(s): "TSH", "T4TOTAL", "FREET4", "T3FREE", "THYROIDAB" in the last 72 hours. Anemia Panel: No results for input(s): "VITAMINB12", "FOLATE", "FERRITIN", "TIBC", "IRON", "RETICCTPCT" in the last 72 hours. Urine analysis:    Component Value Date/Time   COLORURINE YELLOW (A) 06/12/2015 1252   APPEARANCEUR CLEAR (A) 06/12/2015 1252   LABSPEC 1.042 (H) 06/12/2015 1252   PHURINE 6.0 06/12/2015 1252   GLUCOSEU >500 (A) 06/12/2015 1252   HGBUR NEGATIVE 06/12/2015 1252   BILIRUBINUR NEGATIVE 06/12/2015 1252   KETONESUR TRACE (A) 06/12/2015 1252   PROTEINUR 100 (A) 06/12/2015 1252   NITRITE NEGATIVE 06/12/2015 1252   LEUKOCYTESUR NEGATIVE 06/12/2015 1252    Radiological Exams on Admission: DG Foot Complete Right Result Date: 05/07/2023 CLINICAL  DATA:  Foot infection second toe is black EXAM: RIGHT FOOT COMPLETE - 3+ VIEW COMPARISON:  Report 04/06/2020 FINDINGS: No fracture or malalignment. Wound or ulcer plantar aspect of the distal foot. Gas within the soft tissues diffusely surrounding the second digit from the level of the distal metatarsal to the distal phalanx. Additional gas present within the soft tissues of the medial first digit, between the second and third digits, and between the fourth and fifth digits. No obvious osseous destructive change. IMPRESSION: Wound or ulcer plantar aspect of the distal foot with gas within the soft tissues diffusely surrounding the second digit and additional gas present within the soft tissues of the medial first digit, between the second and third digits, and between the fourth and fifth digits. Findings are suspicious for necrotic soft tissue infection. No definite acute osseous abnormality. Electronically Signed   By: Jasmine Pang M.D.   On: 05/07/2023 21:06     Data Reviewed: Relevant notes from primary care and specialist visits, past discharge summaries as available in EHR, including Care Everywhere. Prior diagnostic testing as pertinent to current admission diagnoses Updated medications and problem lists for reconciliation ED course, including vitals, labs, imaging, treatment and response to treatment Triage notes, nursing and pharmacy notes and ED provider's notes Notable results as noted in HPI   Assessment and Plan: * Diabetic foot infection (HCC) Sepsis Sepsis criteria include fever, tachycardia, leukocytosis and lactic acidosis and infection X-ray showing gas Discussed with podiatrist, Dr. Alberteen Spindle who recommends vascular consult and MRI without contrast Will consult vascular Keep n.p.o. from midnight for procedure Cefepime and vancomycin Sepsis fluids Pain control  Uncontrolled type 2 diabetes mellitus with hyperglycemia, with long-term current use of insulin (HCC) Blood sugar  near 400.  Mostly recently a1C over 9 Sliding scale insulin coverage  Obesity, Class III, BMI 40-49.9 (morbid obesity) (HCC) BMI 33 but with comorbidities Complicating factor to overall prognosis  Anxiety Continue BuSpar  Chronic back pain Lumbar radiculopathy Continue cyclobenzaprine, gabapentin and hydrocodone  PAD (peripheral artery disease) (HCC) Continue atorvastatin Getting vascular consult  HTN (hypertension) BP controlled Continue lisinopril, amlodipine and metoprolol  CAD S/P percutaneous coronary angioplasty No complaints of chest pain Continue home aspirin, atorvastatin, Imdur, metoprolol and lisinopril        DVT prophylaxis: SCD's for procedure  Consults: Dr. Alberteen Spindle podiatry and Dr. Gilda Crease vascular  Advance Care Planning:   Code Status: Prior   Family Communication: none  Disposition Plan: Back to previous home environment  Severity of Illness: The appropriate patient status for this patient is INPATIENT. Inpatient status is judged to be reasonable and necessary in order to provide the required intensity of service to ensure the patient's safety. The patient's presenting symptoms, physical exam findings, and initial radiographic and laboratory data in the context of their chronic comorbidities is felt to place them at high risk for further clinical deterioration. Furthermore, it is not anticipated that the patient will be medically stable for discharge from the hospital within 2 midnights of admission.   * I certify that at the point of admission it is my clinical judgment that the patient will require inpatient hospital care spanning beyond 2 midnights from the point of admission due to high intensity of service, high risk for further deterioration and high frequency of surveillance required.*  Author: Andris Baumann, MD 05/07/2023 10:16 PM  For on call review [email or web address].

## 2023-05-07 NOTE — ED Provider Notes (Signed)
[facility] Provider Note    Event Date/Time   First MD Initiated Contact with Patient 05/07/23 1953     (approximate)   History   Chief Complaint: Foot Pain   HPI  Gerald Ellis is a 57 y.o. male with a history of diabetes, hypertension, peripheral neuropathy who comes to the ED complaining of black discoloration of his right foot second toe which started last night.  He reports that he has had blistering over that area over the past few months, following up with Dr. Excell Seltzer of podiatry.  Previously he reports there is been no sign of infection in the area.  Last night he noticed a small area of black discoloration, and this morning the area had rapidly expanded to include the entire right second toe.  Denies chest pain or shortness of breath.  Does endorse pain in the right foot.  No fever.          Physical Exam   Triage Vital Signs: ED Triage Vitals  Encounter Vitals Group     BP 05/07/23 1919 (!) 145/88     Systolic BP Percentile --      Diastolic BP Percentile --      Pulse Rate 05/07/23 1919 (!) 102     Resp 05/07/23 1919 20     Temp 05/07/23 1919 99 F (37.2 C)     Temp Source 05/07/23 1919 Oral     SpO2 05/07/23 1919 99 %     Weight 05/07/23 1918 235 lb (106.6 kg)     Height 05/07/23 1918 5\' 10"  (1.778 m)     Head Circumference --      Peak Flow --      Pain Score 05/07/23 1918 7     Pain Loc --      Pain Education --      Exclude from Growth Chart --     Most recent vital signs: Vitals:   05/07/23 2010 05/07/23 2330  BP:    Pulse:    Resp:    Temp:  99.5 F (37.5 C)  SpO2: 95%     General: Awake, no distress.  CV:  Good peripheral perfusion.  Palpable DP pulse bilaterally.  Intact capillary refill up to margin of necrotic tissue overlying the distal second metatarsal Resp:  Normal effort.  Abd:  No distention.  Other:  Right foot has edema and erythema overlying the midfoot and forefoot.  There is necrosis of the  second toe and duskiness and discoloration of the third toe.  Symmetric calf circumference.  No crepitus.  No purulent drainage.         ED Results / Procedures / Treatments   Labs (all labs ordered are listed, but only abnormal results are displayed) Labs Reviewed  CBC WITH DIFFERENTIAL/PLATELET - Abnormal; Notable for the following components:      Result Value   WBC 15.0 (*)    RBC 4.17 (*)    Hemoglobin 12.1 (*)    HCT 36.3 (*)    Platelets 491 (*)    Neutro Abs 12.3 (*)    Abs Immature Granulocytes 0.09 (*)    All other components within normal limits  COMPREHENSIVE METABOLIC PANEL - Abnormal; Notable for the following components:   Sodium 130 (*)    Chloride 94 (*)    Glucose, Bld 396 (*)    Calcium 8.4 (*)    Total Protein 8.3 (*)    Albumin 2.6 (*)  All other components within normal limits  LACTIC ACID, PLASMA - Abnormal; Notable for the following components:   Lactic Acid, Venous 3.2 (*)    All other components within normal limits  CULTURE, BLOOD (SINGLE)  LACTIC ACID, PLASMA  HEMOGLOBIN A1C  HIV ANTIBODY (ROUTINE TESTING W REFLEX)  BASIC METABOLIC PANEL  CBC     EKG    RADIOLOGY X-ray right foot shows soft tissue erosion, some subcutaneous emphysema over the right second toe.  Radiology report reviewed   PROCEDURES:  .Critical Care  Performed by: Sharman Cheek, MD Authorized by: Sharman Cheek, MD   Critical care provider statement:    Critical care time (minutes):  35   Critical care time was exclusive of:  Separately billable procedures and treating other patients   Critical care was necessary to treat or prevent imminent or life-threatening deterioration of the following conditions:  Sepsis and respiratory failure   Critical care was time spent personally by me on the following activities:  Development of treatment plan with patient or surrogate, discussions with consultants, evaluation of patient's response to treatment,  examination of patient, obtaining history from patient or surrogate, ordering and performing treatments and interventions, ordering and review of laboratory studies, ordering and review of radiographic studies, pulse oximetry, re-evaluation of patient's condition and review of old charts   Care discussed with: admitting provider      MEDICATIONS ORDERED IN ED: Medications  aspirin chewable tablet 81 mg (has no administration in time range)  HYDROcodone-acetaminophen (NORCO/VICODIN) 5-325 MG per tablet 1 tablet (has no administration in time range)  metoprolol succinate (TOPROL-XL) 24 hr tablet 50 mg (has no administration in time range)  lisinopril (ZESTRIL) tablet 20 mg (has no administration in time range)  isosorbide mononitrate (IMDUR) 24 hr tablet 30 mg (has no administration in time range)  atorvastatin (LIPITOR) tablet 80 mg (has no administration in time range)  busPIRone (BUSPAR) tablet 7.5 mg (has no administration in time range)  pantoprazole (PROTONIX) EC tablet 40 mg (has no administration in time range)  cyclobenzaprine (FLEXERIL) tablet 10 mg (has no administration in time range)  omega-3 acid ethyl esters (LOVAZA) capsule 1 g (has no administration in time range)  ipratropium-albuterol (DUONEB) 0.5-2.5 (3) MG/3ML nebulizer solution 3 mL (has no administration in time range)  lactated ringers infusion (has no administration in time range)  insulin aspart (novoLOG) injection 0-20 Units (has no administration in time range)  metroNIDAZOLE (FLAGYL) IVPB 500 mg (has no administration in time range)  acetaminophen (TYLENOL) tablet 650 mg (has no administration in time range)    Or  acetaminophen (TYLENOL) suppository 650 mg (has no administration in time range)  ketorolac (TORADOL) 30 MG/ML injection 30 mg (has no administration in time range)  ondansetron (ZOFRAN) tablet 4 mg (has no administration in time range)    Or  ondansetron (ZOFRAN) injection 4 mg (has no administration  in time range)  morphine (PF) 2 MG/ML injection 2 mg (has no administration in time range)  Vancomycin (VANCOCIN) 1,500 mg in sodium chloride 0.9 % 500 mL IVPB (has no administration in time range)  vancomycin (VANCOCIN) IVPB 1000 mg/200 mL premix (has no administration in time range)  ceFEPIme (MAXIPIME) 2 g in sodium chloride 0.9 % 100 mL IVPB (has no administration in time range)  sodium chloride 0.9 % bolus 1,000 mL (0 mLs Intravenous Stopped 05/07/23 2246)  vancomycin (VANCOCIN) IVPB 1000 mg/200 mL premix (0 mg Intravenous Paused 05/07/23 2246)  piperacillin-tazobactam (ZOSYN) IVPB 3.375 g (0  g Intravenous Stopped 05/07/23 2216)     IMPRESSION / MDM / ASSESSMENT AND PLAN / ED COURSE  I reviewed the triage vital signs and the nursing notes.  DDx: Cellulitis, osteomyelitis, gangrene, necrotizing fasciitis  Patient's presentation is most consistent with acute presentation with potential threat to life or bodily function.  Patient presents with right second toe necrosis with surrounding cellulitis, third toe also appearing dusky.  He has adequate arterial flow just proximally.  Suspect infection related thrombosis.  Will start vancomycin, Zosyn, admit for medical management and podiatry email.  He has seen Dr. Excell Seltzer with podiatry recently.    ----------------------------------------- 11:38 PM on 05/07/2023 ----------------------------------------- Discussed with hospitalist.  Discussed with podiatry who will need vascular evaluation prior to surgical management.  Patient has some mild signs of sepsis with tachycardia, elevated lactate, but is nontoxic, hemodynamically stable, without dishwater drainage or palpable crepitus.     FINAL CLINICAL IMPRESSION(S) / ED DIAGNOSES   Final diagnoses:  Diabetic foot infection (HCC)  Sepsis, due to unspecified organism, unspecified whether acute organ dysfunction present [facility])     Rx / DC Orders   ED Discharge Orders     None         Note:  This document was prepared using Dragon voice recognition software and may include unintentional dictation errors.   Sharman Cheek, MD 05/07/23 [phone]

## 2023-05-07 NOTE — Assessment & Plan Note (Addendum)
Sepsis Sepsis criteria include fever, tachycardia, leukocytosis and lactic acidosis and infection X-ray showing gas Discussed with podiatrist, Dr. Alberteen Spindle who recommends vascular consult and MRI without contrast Will consult vascular Keep n.p.o. from midnight for procedure Cefepime and vancomycin Sepsis fluids Pain control

## 2023-05-07 NOTE — Assessment & Plan Note (Signed)
Blood sugar near 400.  Mostly recently a1C over 9 Sliding scale insulin coverage

## 2023-05-07 NOTE — ED Notes (Signed)
Date and time results received: 05/07/23 2006  Test: Lactic Acid Critical Value: 3.2  Name of Provider Notified: MD Scotty Court

## 2023-05-07 NOTE — Assessment & Plan Note (Signed)
No complaints of chest pain Continue home aspirin, atorvastatin, Imdur, metoprolol and lisinopril

## 2023-05-07 NOTE — Consult Note (Signed)
ED Pharmacy Antibiotic Sign Off An antibiotic consult was received from an ED provider for vancomycin per pharmacy dosing for cellulitis. A chart review was completed to assess appropriateness.   The following one time order(s) were placed:  --Vancomycin 1 g IV  Further antibiotic and/or antibiotic pharmacy consults should be ordered by the admitting provider if indicated.   Thank you for allowing pharmacy to be a part of this patient's care.   Tressie Ellis, [facility]  Clinical Pharmacist 05/07/23 9:31 PM

## 2023-05-07 NOTE — ED Notes (Signed)
Patient to MRI.

## 2023-05-07 NOTE — Assessment & Plan Note (Signed)
Lumbar radiculopathy Continue cyclobenzaprine, gabapentin and hydrocodone

## 2023-05-07 NOTE — Assessment & Plan Note (Signed)
BP controlled Continue lisinopril, amlodipine and metoprolol

## 2023-05-07 NOTE — ED Triage Notes (Signed)
Pt reports blisters to right foot xfew months that he follows up with, pt reports overnight his right 2nd toe turned black and blisters became worse. Pts foot red and skin sloughing, pts 2nd right toe is black.

## 2023-05-08 ENCOUNTER — Inpatient Hospital Stay: Payer: PPO | Admitting: Anesthesiology

## 2023-05-08 ENCOUNTER — Encounter: Payer: Self-pay | Admitting: Internal Medicine

## 2023-05-08 ENCOUNTER — Encounter
Admission: EM | Disposition: A | Discharge status: Skilled Nursing Facility | Payer: Self-pay | Source: Home / Self Care | Attending: Osteopathic Medicine

## 2023-05-08 ENCOUNTER — Other Ambulatory Visit: Payer: Self-pay

## 2023-05-08 DIAGNOSIS — E1142 Type 2 diabetes mellitus with diabetic polyneuropathy: Secondary | ICD-10-CM | POA: Diagnosis not present

## 2023-05-08 DIAGNOSIS — I96 Gangrene, not elsewhere classified: Secondary | ICD-10-CM | POA: Diagnosis not present

## 2023-05-08 DIAGNOSIS — L089 Local infection of the skin and subcutaneous tissue, unspecified: Secondary | ICD-10-CM | POA: Diagnosis not present

## 2023-05-08 DIAGNOSIS — E11628 Type 2 diabetes mellitus with other skin complications: Secondary | ICD-10-CM | POA: Diagnosis not present

## 2023-05-08 DIAGNOSIS — I739 Peripheral vascular disease, unspecified: Secondary | ICD-10-CM | POA: Diagnosis not present

## 2023-05-08 HISTORY — PX: TRANSMETATARSAL AMPUTATION: SHX6197

## 2023-05-08 LAB — CBC
HCT: 29.9 % — ABNORMAL LOW (ref 39.0–52.0)
Hemoglobin: 10.4 g/dL — ABNORMAL LOW (ref 13.0–17.0)
MCH: 29 pg (ref 26.0–34.0)
MCHC: 34.8 g/dL (ref 30.0–36.0)
MCV: 83.3 fL (ref 80.0–100.0)
Platelets: 407 10*3/uL — ABNORMAL HIGH (ref 150–400)
RBC: 3.59 MIL/uL — ABNORMAL LOW (ref 4.22–5.81)
RDW: 12.5 % (ref 11.5–15.5)
WBC: 12.8 10*3/uL — ABNORMAL HIGH (ref 4.0–10.5)
nRBC: 0 % (ref 0.0–0.2)

## 2023-05-08 LAB — BASIC METABOLIC PANEL
Anion gap: 11 (ref 5–15)
BUN: 10 mg/dL (ref 6–20)
CO2: 22 mmol/L (ref 22–32)
Calcium: 7.9 mg/dL — ABNORMAL LOW (ref 8.9–10.3)
Chloride: 100 mmol/L (ref 98–111)
Creatinine, Ser: 0.91 mg/dL (ref 0.61–1.24)
GFR, Estimated: >60 mL/min (ref >60)
Glucose, Bld: 205 mg/dL — ABNORMAL HIGH (ref 70–99)
Potassium: 3.5 mmol/L (ref 3.5–5.1)
Sodium: 133 mmol/L — ABNORMAL LOW (ref 135–145)

## 2023-05-08 LAB — GLUCOSE, CAPILLARY
Glucose-Capillary: 108 mg/dL — ABNORMAL HIGH (ref 70–99)
Glucose-Capillary: 122 mg/dL — ABNORMAL HIGH (ref 70–99)
Glucose-Capillary: 190 mg/dL — ABNORMAL HIGH (ref 70–99)
Glucose-Capillary: 195 mg/dL — ABNORMAL HIGH (ref 70–99)
Glucose-Capillary: 226 mg/dL — ABNORMAL HIGH (ref 70–99)
Glucose-Capillary: 261 mg/dL — ABNORMAL HIGH (ref 70–99)
Glucose-Capillary: 287 mg/dL — ABNORMAL HIGH (ref 70–99)
Glucose-Capillary: 312 mg/dL — ABNORMAL HIGH (ref 70–99)

## 2023-05-08 LAB — HEMOGLOBIN A1C
Hgb A1c MFr Bld: 11.2 % — ABNORMAL HIGH (ref 4.8–5.6)
Mean Plasma Glucose: 274.74 mg/dL

## 2023-05-08 LAB — SURGICAL PCR SCREEN
MRSA, PCR: NEGATIVE
Staphylococcus aureus: POSITIVE — AB

## 2023-05-08 LAB — HIV ANTIBODY (ROUTINE TESTING W REFLEX): HIV Screen 4th Generation wRfx: NONREACTIVE

## 2023-05-08 SURGERY — AMPUTATION, FOOT, TRANSMETATARSAL
Anesthesia: General | Site: Foot | Laterality: Right

## 2023-05-08 MED ORDER — ONDANSETRON HCL 4 MG/2ML IJ SOLN
INTRAMUSCULAR | Status: DC | PRN
  Administered 2023-05-08: 4 mg via INTRAVENOUS

## 2023-05-08 MED ORDER — PHENYLEPHRINE 80 MCG/ML (10ML) SYRINGE FOR IV PUSH (FOR BLOOD PRESSURE SUPPORT)
PREFILLED_SYRINGE | INTRAVENOUS | Status: DC | PRN
  Administered 2023-05-08: 80 ug via INTRAVENOUS

## 2023-05-08 MED ORDER — BUPIVACAINE HCL (PF) 0.5 % IJ SOLN
INTRAMUSCULAR | Status: AC
  Filled 2023-05-08: qty 30

## 2023-05-08 MED ORDER — FENTANYL CITRATE (PF) 100 MCG/2ML IJ SOLN
INTRAMUSCULAR | Status: AC
  Filled 2023-05-08: qty 2

## 2023-05-08 MED ORDER — PROPOFOL 10 MG/ML IV BOLUS
INTRAVENOUS | Status: DC | PRN
  Administered 2023-05-08: 200 mg via INTRAVENOUS

## 2023-05-08 MED ORDER — ROCURONIUM BROMIDE 100 MG/10ML IV SOLN
INTRAVENOUS | Status: DC | PRN
  Administered 2023-05-08: 30 mg via INTRAVENOUS

## 2023-05-08 MED ORDER — KETOROLAC TROMETHAMINE 30 MG/ML IJ SOLN
INTRAMUSCULAR | Status: AC
  Filled 2023-05-08: qty 1

## 2023-05-08 MED ORDER — INSULIN ASPART 100 UNIT/ML IJ SOLN
INTRAMUSCULAR | Status: AC
  Filled 2023-05-08: qty 1

## 2023-05-08 MED ORDER — SUGAMMADEX SODIUM 200 MG/2ML IV SOLN
INTRAVENOUS | Status: DC | PRN
  Administered 2023-05-08: 200 mg via INTRAVENOUS

## 2023-05-08 MED ORDER — VANCOMYCIN HCL IN DEXTROSE 1-5 GM/200ML-% IV SOLN
INTRAVENOUS | Status: AC
  Filled 2023-05-08: qty 200

## 2023-05-08 MED ORDER — HYDROCODONE-ACETAMINOPHEN 5-325 MG PO TABS
1.0000 | ORAL_TABLET | ORAL | Status: DC | PRN
  Administered 2023-05-08 (×2): 1 via ORAL
  Administered 2023-05-09 – 2023-05-10 (×4): 2 via ORAL
  Administered 2023-05-10: 1 via ORAL
  Administered 2023-05-10 – 2023-05-11 (×3): 2 via ORAL
  Administered 2023-05-11: 1 via ORAL
  Administered 2023-05-13 (×2): 2 via ORAL
  Administered 2023-05-14: 1 via ORAL
  Administered 2023-05-14 – 2023-05-15 (×2): 2 via ORAL
  Filled 2023-05-08 (×3): qty 2
  Filled 2023-05-08: qty 1
  Filled 2023-05-08 (×2): qty 2
  Filled 2023-05-08 (×2): qty 1
  Filled 2023-05-08 (×7): qty 2

## 2023-05-08 MED ORDER — OXYCODONE HCL 5 MG/5ML PO SOLN: 5.0000 mg | Freq: Once | ORAL | Status: DC | PRN

## 2023-05-08 MED ORDER — AMLODIPINE BESYLATE 5 MG PO TABS
5.0000 mg | ORAL_TABLET | Freq: Every day | ORAL | Status: DC
  Administered 2023-05-09 – 2023-05-26 (×18): 5 mg via ORAL
  Filled 2023-05-08 (×18): qty 1

## 2023-05-08 MED ORDER — MUPIROCIN 2 % EX OINT
1.0000 | TOPICAL_OINTMENT | Freq: Two times a day (BID) | CUTANEOUS | Status: AC
  Administered 2023-05-08 – 2023-05-13 (×10): 1 via NASAL
  Filled 2023-05-08 (×2): qty 22

## 2023-05-08 MED ORDER — GLYCOPYRROLATE 0.2 MG/ML IJ SOLN
INTRAMUSCULAR | Status: DC | PRN
  Administered 2023-05-08: .2 mg via INTRAVENOUS

## 2023-05-08 MED ORDER — SCOPOLAMINE 1 MG/3DAYS TD PT72
MEDICATED_PATCH | TRANSDERMAL | Status: DC | PRN
  Administered 2023-05-08: 1 via TRANSDERMAL

## 2023-05-08 MED ORDER — FENTANYL CITRATE (PF) 100 MCG/2ML IJ SOLN: 25.0000 ug | INTRAMUSCULAR | Status: DC | PRN

## 2023-05-08 MED ORDER — MIDAZOLAM HCL 2 MG/2ML IJ SOLN
INTRAMUSCULAR | Status: DC | PRN
  Administered 2023-05-08: 2 mg via INTRAVENOUS

## 2023-05-08 MED ORDER — MIDAZOLAM HCL 2 MG/2ML IJ SOLN
INTRAMUSCULAR | Status: AC
Start: 2023-05-08 — End: ?
  Filled 2023-05-08: qty 2

## 2023-05-08 MED ORDER — ENSURE ENLIVE PO LIQD
237.0000 mL | Freq: Two times a day (BID) | ORAL | Status: DC
  Administered 2023-05-09 – 2023-05-26 (×30): 237 mL via ORAL

## 2023-05-08 MED ORDER — 0.9 % SODIUM CHLORIDE (POUR BTL) OPTIME
TOPICAL | Status: DC | PRN
  Administered 2023-05-08: 500 mL

## 2023-05-08 MED ORDER — FENTANYL CITRATE (PF) 100 MCG/2ML IJ SOLN
25.0000 ug | INTRAMUSCULAR | Status: DC | PRN
  Administered 2023-05-08 (×2): 25 ug via INTRAVENOUS
  Administered 2023-05-08: 50 ug via INTRAVENOUS

## 2023-05-08 MED ORDER — DEXAMETHASONE SODIUM PHOSPHATE 10 MG/ML IJ SOLN
INTRAMUSCULAR | Status: DC | PRN
  Administered 2023-05-08: 5 mg via INTRAVENOUS

## 2023-05-08 MED ORDER — LIDOCAINE HCL (CARDIAC) PF 100 MG/5ML IV SOSY
PREFILLED_SYRINGE | INTRAVENOUS | Status: DC | PRN
  Administered 2023-05-08: 100 mg via INTRAVENOUS

## 2023-05-08 MED ORDER — OXYCODONE HCL 5 MG PO TABS: 5.0000 mg | ORAL_TABLET | Freq: Once | ORAL | Status: DC | PRN

## 2023-05-08 MED ORDER — INSULIN ASPART 100 UNIT/ML IJ SOLN
5.0000 [IU] | Freq: Once | INTRAMUSCULAR | Status: AC
  Administered 2023-05-08: 5 [IU] via SUBCUTANEOUS

## 2023-05-08 MED ORDER — PRASUGREL HCL 10 MG PO TABS
10.0000 mg | ORAL_TABLET | Freq: Every day | ORAL | Status: DC
  Administered 2023-05-09 – 2023-05-16 (×8): 10 mg via ORAL
  Filled 2023-05-08 (×8): qty 1

## 2023-05-08 MED ORDER — CHLORHEXIDINE GLUCONATE CLOTH 2 % EX PADS
6.0000 | MEDICATED_PAD | Freq: Every day | CUTANEOUS | Status: AC
  Administered 2023-05-09 – 2023-05-13 (×5): 6 via TOPICAL

## 2023-05-08 MED ORDER — BUPIVACAINE HCL (PF) 0.5 % IJ SOLN
INTRAMUSCULAR | Status: DC | PRN
  Administered 2023-05-08: 10 mL

## 2023-05-08 MED ORDER — SUCCINYLCHOLINE CHLORIDE 200 MG/10ML IV SOSY
PREFILLED_SYRINGE | INTRAVENOUS | Status: DC | PRN
  Administered 2023-05-08: 100 mg via INTRAVENOUS

## 2023-05-08 MED ORDER — SODIUM CHLORIDE 0.9 % IR SOLN
Status: DC | PRN
  Administered 2023-05-08: 3000 mL

## 2023-05-08 MED ORDER — SCOPOLAMINE 1 MG/3DAYS TD PT72
MEDICATED_PATCH | TRANSDERMAL | Status: AC
  Filled 2023-05-08: qty 1

## 2023-05-08 MED ORDER — INSULIN GLARGINE-YFGN 100 UNIT/ML ~~LOC~~ SOLN
30.0000 [IU] | Freq: Every day | SUBCUTANEOUS | Status: DC
  Administered 2023-05-08: 30 [IU] via SUBCUTANEOUS
  Filled 2023-05-08 (×2): qty 0.3

## 2023-05-08 SURGICAL SUPPLY — 42 items
BLADE MED AGGRESSIVE (BLADE) ×1 IMPLANT
BNDG COHESIVE 4X5 TAN STRL LF (GAUZE/BANDAGES/DRESSINGS) ×1 IMPLANT
BNDG ELASTIC 4X5.8 VLCR NS LF (GAUZE/BANDAGES/DRESSINGS) ×1 IMPLANT
BNDG ESMARCH 4X12 STRL LF (GAUZE/BANDAGES/DRESSINGS) ×1 IMPLANT
BNDG GAUZE DERMACEA FLUFF 4 (GAUZE/BANDAGES/DRESSINGS) ×1 IMPLANT
CUFF TOURN SGL QUICK 12 (TOURNIQUET CUFF) IMPLANT
CUFF TOURN SGL QUICK 18X4 (TOURNIQUET CUFF) IMPLANT
DRAIN PENROSE 12X.25 LTX STRL (MISCELLANEOUS) ×1 IMPLANT
DRAPE FLUOR MINI C-ARM 54X84 (DRAPES) ×1 IMPLANT
DRSG GAUZE FLUFF 36X18 (GAUZE/BANDAGES/DRESSINGS) ×1 IMPLANT
DURAPREP 26ML APPLICATOR (WOUND CARE) ×1 IMPLANT
ELECT REM PT RETURN 9FT ADLT (ELECTROSURGICAL) ×1
ELECTRODE REM PT RTRN 9FT ADLT (ELECTROSURGICAL) ×1 IMPLANT
GAUZE SPONGE 4X4 12PLY STRL (GAUZE/BANDAGES/DRESSINGS) ×1 IMPLANT
GAUZE XEROFORM 1X8 LF (GAUZE/BANDAGES/DRESSINGS) ×1 IMPLANT
GLOVE BIO SURGEON STRL SZ7.5 (GLOVE) ×1 IMPLANT
GLOVE INDICATOR 8.0 STRL GRN (GLOVE) ×1 IMPLANT
GOWN STRL REUS W/ TWL LRG LVL3 (GOWN DISPOSABLE) ×2 IMPLANT
HANDLE YANKAUER SUCT BULB TIP (MISCELLANEOUS) ×1 IMPLANT
HANDPIECE INTERPULSE COAX TIP (DISPOSABLE) IMPLANT
HANDPIECE VERSAJET 45 DEG 14 (MISCELLANEOUS) IMPLANT
HANDPIECE VERSAJET DEBRIDEMENT (MISCELLANEOUS) ×1 IMPLANT
IV NS IRRIG 3000ML ARTHROMATIC (IV SOLUTION) ×1 IMPLANT
KIT TURNOVER KIT A (KITS) ×1 IMPLANT
LABEL OR SOLS (LABEL) ×1 IMPLANT
MANIFOLD NEPTUNE II (INSTRUMENTS) ×1 IMPLANT
NDL SAFETY ECLIPSE 18X1.5 (NEEDLE) ×1 IMPLANT
NS IRRIG 500ML POUR BTL (IV SOLUTION) ×1 IMPLANT
PACK EXTREMITY ARMC (MISCELLANEOUS) ×1 IMPLANT
PAD ABD DERMACEA PRESS 5X9 (GAUZE/BANDAGES/DRESSINGS) ×1 IMPLANT
SOL PREP PVP 2OZ (MISCELLANEOUS) ×1
SOLUTION PREP PVP 2OZ (MISCELLANEOUS) ×1 IMPLANT
SPONGE T-LAP 18X18 ~~LOC~~+RFID (SPONGE) ×1 IMPLANT
STAPLER SKIN PROX 35W (STAPLE) ×1 IMPLANT
STOCKINETTE STRL 6IN 960660 (GAUZE/BANDAGES/DRESSINGS) ×1 IMPLANT
STRAP SAFETY 5IN WIDE (MISCELLANEOUS) ×1 IMPLANT
SUT VIC AB 2-0 CT1 TAPERPNT 27 (SUTURE) ×2 IMPLANT
SUT VICRYL+ 3-0 36IN CT-1 (SUTURE) ×2 IMPLANT
SWAB CULTURE AMIES ANAERIB BLU (MISCELLANEOUS) IMPLANT
SYR 10ML LL (SYRINGE) ×2 IMPLANT
TRAP FLUID SMOKE EVACUATOR (MISCELLANEOUS) ×1 IMPLANT
WATER STERILE IRR 500ML POUR (IV SOLUTION) ×1 IMPLANT

## 2023-05-08 NOTE — H&P (View-Only) (Signed)
Reason for Consult: Gas gangrene right foot. Referring Physician: Eder Strome is an 57 y.o. male.  HPI: This is a 57 year old male with significant history of diabetes with neuropathy.  States he has had a chronic ulcer on his right foot.  In the past he has seen Dr. Excell Seltzer but his last appointment was a couple of months ago.  States he had to miss his follow-ups due to other medical issues.  States that a few days ago he noticed a discolored area on his second toe and that yesterday the entire toe had become black.  Presented to the emergency department for evaluation.  Past Medical History:  Diagnosis Date   Benign enlargement of prostate    Bronchitis    Childhood asthma    Diabetes mellitus without complication (HCC)    Environmental allergies    Erectile dysfunction    Headache    Hemorrhoids    Hypercholesteremia    Hypertension    Hypogonadism in male    Lumbago    Over weight     Past Surgical History:  Procedure Laterality Date   BACK SURGERY  1992   CHOLECYSTECTOMY     COLONOSCOPY WITH PROPOFOL N/A 08/07/2015   Procedure: COLONOSCOPY WITH PROPOFOL;  Surgeon: Christena Deem, MD;  Location: [facility] ENDOSCOPY;  Service: Endoscopy;  Laterality: N/A;   COLONOSCOPY WITH PROPOFOL N/A 08/08/2015   Procedure: COLONOSCOPY WITH PROPOFOL;  Surgeon: Christena Deem, MD;  Location: [facility] ENDOSCOPY;  Service: Endoscopy;  Laterality: N/A;   COLONOSCOPY WITH PROPOFOL N/A 09/16/2017   Procedure: COLONOSCOPY WITH PROPOFOL;  Surgeon: Christena Deem, MD;  Location: [facility] ENDOSCOPY;  Service: Endoscopy;  Laterality: N/A;   COLONOSCOPY WITH PROPOFOL N/A 12/20/2020   Procedure: COLONOSCOPY WITH PROPOFOL;  Surgeon: Earline Mayotte, MD;  Location: [facility] ENDOSCOPY;  Service: Endoscopy;  Laterality: N/A;  IDDM   CORONARY STENT INTERVENTION N/A 04/20/2019   Procedure: CORONARY STENT INTERVENTION;  Surgeon: Marcina Millard, MD;  Location: [facility] INVASIVE CV LAB;  Service:  Cardiovascular;  Laterality: N/A;   ESOPHAGOGASTRODUODENOSCOPY (EGD) WITH PROPOFOL N/A 08/07/2015   Procedure: ESOPHAGOGASTRODUODENOSCOPY (EGD) WITH PROPOFOL;  Surgeon: Christena Deem, MD;  Location: [facility] ENDOSCOPY;  Service: Endoscopy;  Laterality: N/A;   ESOPHAGOGASTRODUODENOSCOPY (EGD) WITH PROPOFOL N/A 09/16/2017   Procedure: ESOPHAGOGASTRODUODENOSCOPY (EGD) WITH PROPOFOL;  Surgeon: Christena Deem, MD;  Location: [facility] ENDOSCOPY;  Service: Endoscopy;  Laterality: N/A;   ESOPHAGOGASTRODUODENOSCOPY (EGD) WITH PROPOFOL N/A 12/20/2020   Procedure: ESOPHAGOGASTRODUODENOSCOPY (EGD) WITH PROPOFOL;  Surgeon: Earline Mayotte, MD;  Location: [facility] ENDOSCOPY;  Service: Endoscopy;  Laterality: N/A;   LEFT HEART CATH AND CORONARY ANGIOGRAPHY Left 04/20/2019   Procedure: LEFT HEART CATH AND CORONARY ANGIOGRAPHY;  Surgeon: Marcina Millard, MD;  Location: [facility] INVASIVE CV LAB;  Service: Cardiovascular;  Laterality: Left;    Family History  Problem Relation Age of Onset   Heart disease Mother        CABG   Lung cancer Mother        Disseminated   Hypertension Mother    Hyperlipidemia Mother    Diabetes Father        mother   Colon cancer Father 49   Cancer Maternal Aunt        x3 aunts, unk types of cancer   Colon cancer Maternal Uncle        x3 uncles   Prostate cancer Maternal Uncle        x4 uncles   Cancer Paternal Uncle  prostate vs colon   Alzheimer's disease Other    Kidney disease Neg Hx     Social History:  reports that he has never smoked. He has never used smokeless tobacco. He reports current alcohol use. He reports that he does not use drugs.  Allergies:  Allergies  Allergen Reactions   Ibuprofen Shortness Of Breath   Nsaids Swelling    Swelling of throat    Medications: Scheduled:  aspirin  81 mg Oral Daily   atorvastatin  80 mg Oral q1800   busPIRone  7.5 mg Oral BID   insulin aspart  0-20 Units Subcutaneous Q4H   isosorbide mononitrate  30 mg Oral  Daily   lisinopril  20 mg Oral Daily   metoprolol succinate  50 mg Oral Daily   omega-3 acid ethyl esters  1 g Oral BID   pantoprazole  40 mg Oral BID    Results for orders placed or performed during the hospital encounter of 05/07/23 (from the past 48 hours)  CBC with Differential     Status: Abnormal   Collection Time: 05/07/23  7:21 PM  Result Value Ref Range   WBC 15.0 (H) 4.0 - 10.5 K/uL   RBC 4.17 (L) 4.22 - 5.81 MIL/uL   Hemoglobin 12.1 (L) 13.0 - 17.0 g/dL   HCT 40.9 (L) 81.1 - 91.4 %   MCV 87.1 80.0 - 100.0 fL   MCH 29.0 26.0 - 34.0 pg   MCHC 33.3 30.0 - 36.0 g/dL   RDW 78.2 95.6 - 21.3 %   Platelets 491 (H) 150 - 400 K/uL   nRBC 0.0 0.0 - 0.2 %   Neutrophils Relative % 82 %   Neutro Abs 12.3 (H) 1.7 - 7.7 K/uL   Lymphocytes Relative 12 %   Lymphs Abs 1.8 0.7 - 4.0 K/uL   Monocytes Relative 4 %   Monocytes Absolute 0.7 0.1 - 1.0 K/uL   Eosinophils Relative 1 %   Eosinophils Absolute 0.1 0.0 - 0.5 K/uL   Basophils Relative 0 %   Basophils Absolute 0.0 0.0 - 0.1 K/uL   Immature Granulocytes 1 %   Abs Immature Granulocytes 0.09 (H) 0.00 - 0.07 K/uL    Comment: Performed at [facility], [address] [place], [place] [address]  Comprehensive metabolic panel     Status: Abnormal   Collection Time: 05/07/23  7:21 PM  Result Value Ref Range   Sodium 130 (L) 135 - 145 mmol/L   Potassium 4.1 3.5 - 5.1 mmol/L   Chloride 94 (L) 98 - 111 mmol/L   CO2 25 22 - 32 mmol/L   Glucose, Bld 396 (H) 70 - 99 mg/dL    Comment: Glucose reference range applies only to samples taken after fasting for at least 8 hours.   BUN 12 6 - 20 mg/dL   Creatinine, Ser 8.46 0.61 - 1.24 mg/dL   Calcium 8.4 (L) 8.9 - 10.3 mg/dL   Total Protein 8.3 (H) 6.5 - 8.1 g/dL   Albumin 2.6 (L) 3.5 - 5.0 g/dL   AST 36 15 - 41 U/L   ALT 41 0 - 44 U/L   Alkaline Phosphatase 97 38 - 126 U/L   Total Bilirubin 0.9 <1.2 mg/dL   GFR, Estimated >96 >29 mL/min    Comment: (NOTE) Calculated using the  CKD-EPI Creatinine Equation (2021)    Anion gap 11 5 - 15    Comment: Performed at [facility], [address] [place], [place] [address]  Lactic acid, plasma     Status: Abnormal   Collection Time: 05/07/23  7:21 PM  Result Value Ref Range   Lactic Acid, Venous 3.2 (HH) 0.5 - 1.9 mmol/L    Comment: CRITICAL RESULT CALLED TO, READ BACK BY AND VERIFIED WITH Tacy Dura @2005  on 05/07/23 skl Performed at [facility], [address] [place], [place] [address]   Blood culture (single)     Status: None (Preliminary result)   Collection Time: 05/07/23  7:21 PM   Specimen: BLOOD  Result Value Ref Range   Specimen Description BLOOD LEFT FOREARM    Special Requests      BOTTLES DRAWN AEROBIC AND ANAEROBIC Blood Culture adequate volume   Culture      NO GROWTH < 12 HOURS Performed at [facility], [address] [place], [place] [address]    Report Status PENDING   Hemoglobin A1c     Status: Abnormal   Collection Time: 05/07/23  7:21 PM  Result Value Ref Range   Hgb A1c MFr Bld 11.2 (H) 4.8 - 5.6 %    Comment: (NOTE) Pre diabetes:          5.7%-6.4%  Diabetes:              >6.4%  Glycemic control for   <7.0% adults with diabetes    Mean Plasma Glucose 274.74 mg/dL    Comment: Performed at [facility], [address] [place], [place] [address]  Lactic acid, plasma     Status: None   Collection Time: 05/07/23  9:42 PM  Result Value Ref Range   Lactic Acid, Venous 1.5 0.5 - 1.9 mmol/L    Comment: Performed at [facility], [address] [place], [place] [address]  Glucose, capillary     Status: Abnormal   Collection Time: 05/08/23 12:57 AM  Result Value Ref Range   Glucose-Capillary 312 (H) 70 - 99 mg/dL    Comment: Glucose reference range applies only to samples taken after fasting for at least 8 hours.  Glucose, capillary     Status: Abnormal   Collection Time: 05/08/23  4:24 AM  Result Value Ref Range   Glucose-Capillary  226 (H) 70 - 99 mg/dL    Comment: Glucose reference range applies only to samples taken after fasting for at least 8 hours.  Basic metabolic panel     Status: Abnormal   Collection Time: 05/08/23  4:31 AM  Result Value Ref Range   Sodium 133 (L) 135 - 145 mmol/L   Potassium 3.5 3.5 - 5.1 mmol/L   Chloride 100 98 - 111 mmol/L   CO2 22 22 - 32 mmol/L   Glucose, Bld 205 (H) 70 - 99 mg/dL    Comment: Glucose reference range applies only to samples taken after fasting for at least 8 hours.   BUN 10 6 - 20 mg/dL   Creatinine, Ser 6.57 0.61 - 1.24 mg/dL   Calcium 7.9 (L) 8.9 - 10.3 mg/dL   GFR, Estimated >84 >69 mL/min    Comment: (NOTE) Calculated using the CKD-EPI Creatinine Equation (2021)    Anion gap 11 5 - 15    Comment: Performed at [facility], [address] [place], [place] [address]  CBC     Status: Abnormal   Collection Time: 05/08/23  4:31 AM  Result Value Ref Range   WBC 12.8 (H) 4.0 - 10.5 K/uL   RBC 3.59 (L) 4.22 - 5.81 MIL/uL  Hemoglobin 10.4 (L) 13.0 - 17.0 g/dL   HCT 62.9 (L) 52.8 - 41.3 %   MCV 83.3 80.0 - 100.0 fL   MCH 29.0 26.0 - 34.0 pg   MCHC 34.8 30.0 - 36.0 g/dL   RDW 24.4 01.0 - 27.2 %   Platelets 407 (H) 150 - 400 K/uL   nRBC 0.0 0.0 - 0.2 %    Comment: Performed at [facility], [address] [place], [place] [address]  Glucose, capillary     Status: Abnormal   Collection Time: 05/08/23  8:09 AM  Result Value Ref Range   Glucose-Capillary 122 (H) 70 - 99 mg/dL    Comment: Glucose reference range applies only to samples taken after fasting for at least 8 hours.    DG Foot Complete Right Result Date: 05/07/2023 CLINICAL DATA:  Foot infection second toe is black EXAM: RIGHT FOOT COMPLETE - 3+ VIEW COMPARISON:  Report 04/06/2020 FINDINGS: No fracture or malalignment. Wound or ulcer plantar aspect of the distal foot. Gas within the soft tissues diffusely surrounding the second digit from the level of the distal metatarsal to the  distal phalanx. Additional gas present within the soft tissues of the medial first digit, between the second and third digits, and between the fourth and fifth digits. No obvious osseous destructive change. IMPRESSION: Wound or ulcer plantar aspect of the distal foot with gas within the soft tissues diffusely surrounding the second digit and additional gas present within the soft tissues of the medial first digit, between the second and third digits, and between the fourth and fifth digits. Findings are suspicious for necrotic soft tissue infection. No definite acute osseous abnormality. Electronically Signed   By: Jasmine Pang M.D.   On: 05/07/2023 21:06    Review of Systems  Constitutional:  Negative for chills and fever.  HENT:  Negative for sinus pain and sore throat.   Respiratory:  Negative for choking and shortness of breath.   Cardiovascular:  Negative for chest pain and palpitations.  Gastrointestinal:  Positive for nausea and vomiting.  Genitourinary:  Negative for frequency and urgency.  Musculoskeletal:  Positive for back pain. Negative for myalgias.  Skin:        Relates black discoloration on his right foot at the second toe.  States this just recently started turning black a few days ago and got really bad yesterday.  Has had some drainage.  Has had problems with a chronic ulceration on the foot.  Has had some redness and swelling in the right foot but states the redness seems to be improving now that he is on antibiotics.  Neurological:        Patient relates significant neuropathy associated with his diabetes.  Psychiatric/Behavioral:  Negative for confusion. The patient is not nervous/anxious.    Blood pressure (!) 156/94, pulse 88, temperature 99.4 F (37.4 C), resp. rate 16, height 5\' 10"  (1.778 m), weight 106.6 kg, SpO2 99%. Physical Exam Cardiovascular:     Comments: Could not palpate a DP pulse on the right.  PT pulse was diminished. Musculoskeletal:     Comments:  Guarded range of motion in the right foot.  Some digital contractures are noted.  Muscle testing deferred.  Skin:    Comments: Gangrenous changes noted to the right second and third toes with some significant surrounding erythema.  Significant malodor.  Erythema extends up onto the dorsal midfoot.  Neurological:     Comments: Loss of protective threshold in the feet bilateral.  Assessment/Plan: Assessment: 1.  Gas gangrene right foot. 2.  Diabetes with associated neuropathy. 3.  Peripheral vascular disease.  Plan: Discussed with the patient the need for transmetatarsal amputation to try to remove all of the infection in his foot.  Discussed that we would also need to have vascular surgery perform angiogram.  Spoke with Dr. Gilda Crease this morning.  Due to the gas and extent of the infection he recommended proceeding with transmetatarsal amputation and plan for angio most likely tomorrow.  Discussed with the patient possible risks and complications of the procedure including but not limited to inability of the wound to heal due to his diabetes, vascular status, or continued infection.  Discussed that he is at significant risk for the need for higher amputation to eliminate the infection.  No guarantees could be given as to the outcome of surgery.  Questions invited and answered.  Obtain consent for transmetatarsal amputation right foot.  Patient is currently NPO.  Plan for surgery around noon.  [name] 05/08/2023, 8:48 AM

## 2023-05-08 NOTE — Transfer of Care (Signed)
Immediate Anesthesia Transfer of Care Note  Patient: Gerald Ellis  Procedure(s) Performed: TRANSMETATARSAL AMPUTATION (Right: Foot)  Patient Location: PACU  Anesthesia Type:General  Level of Consciousness: awake, drowsy, and patient cooperative  Airway & Oxygen Therapy: Patient Spontanous Breathing  Post-op Assessment: Report given to RN and Post -op Vital signs reviewed and stable  Post vital signs: Reviewed and stable  Last Vitals:  Vitals Value Taken Time  BP 143/77 05/08/23 1252  Temp    Pulse 90 05/08/23 1258  Resp 20 05/08/23 1258  SpO2 94 % 05/08/23 1258  Vitals shown include unfiled device data.  Last Pain:  Vitals:   05/08/23 1042  TempSrc: Temporal  PainSc: 6          Complications: No notable events documented.

## 2023-05-08 NOTE — Consult Note (Signed)
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 Requesting Physician:  Dr Shonna Chock MD  MRN #:  [id number]  History of Present Illness: This is a 57 y.o. male with significant history of diabetes with neuropathy. States he has had a chronic ulcer on his right foot. In the past he has seen Dr. Excell Seltzer but his last appointment was a couple of months ago. States he had to miss his follow-ups due to other medical issues. States that a few days ago he noticed a discolored area on his second toe and that yesterday the entire toe had become black. Presented to the emergency department for evaluation.   Upon examination workup in the emergency department patient underwent an MRI of his right foot lower extremity.  He is noted to have extensive soft tissue emphysema within the medial aspect of the forefoot extending into the first and second web spaces with associated ill-defined surrounding fluid.  These findings normally are concerning for necrotizing soft tissue infection.  MRI did not produce any evidence of osteomyelitis or septic arthritis.  Vascular surgery was consulted to evaluate.  Past Medical History:  Diagnosis Date   Benign enlargement of prostate    Bronchitis    Childhood asthma    Diabetes mellitus without complication (HCC)    Environmental allergies    Erectile dysfunction    Headache    Hemorrhoids    Hypercholesteremia    Hypertension    Hypogonadism in male    Lumbago    Over weight     Past Surgical History:  Procedure Laterality Date   BACK SURGERY  1992   CHOLECYSTECTOMY     COLONOSCOPY WITH PROPOFOL N/A 08/07/2015   Procedure: COLONOSCOPY WITH PROPOFOL;  Surgeon: Christena Deem, MD;  Location: [facility] ENDOSCOPY;  Service: Endoscopy;  Laterality: N/A;   COLONOSCOPY WITH PROPOFOL N/A 08/08/2015   Procedure: COLONOSCOPY WITH PROPOFOL;  Surgeon: Christena Deem, MD;  Location: [facility] ENDOSCOPY;  Service: Endoscopy;  Laterality: N/A;    COLONOSCOPY WITH PROPOFOL N/A 09/16/2017   Procedure: COLONOSCOPY WITH PROPOFOL;  Surgeon: Christena Deem, MD;  Location: [facility] ENDOSCOPY;  Service: Endoscopy;  Laterality: N/A;   COLONOSCOPY WITH PROPOFOL N/A 12/20/2020   Procedure: COLONOSCOPY WITH PROPOFOL;  Surgeon: Earline Mayotte, MD;  Location: [facility] ENDOSCOPY;  Service: Endoscopy;  Laterality: N/A;  IDDM   CORONARY STENT INTERVENTION N/A 04/20/2019   Procedure: CORONARY STENT INTERVENTION;  Surgeon: Marcina Millard, MD;  Location: [facility] INVASIVE CV LAB;  Service: Cardiovascular;  Laterality: N/A;   ESOPHAGOGASTRODUODENOSCOPY (EGD) WITH PROPOFOL N/A 08/07/2015   Procedure: ESOPHAGOGASTRODUODENOSCOPY (EGD) WITH PROPOFOL;  Surgeon: Christena Deem, MD;  Location: [facility] ENDOSCOPY;  Service: Endoscopy;  Laterality: N/A;   ESOPHAGOGASTRODUODENOSCOPY (EGD) WITH PROPOFOL N/A 09/16/2017   Procedure: ESOPHAGOGASTRODUODENOSCOPY (EGD) WITH PROPOFOL;  Surgeon: Christena Deem, MD;  Location: [facility] ENDOSCOPY;  Service: Endoscopy;  Laterality: N/A;   ESOPHAGOGASTRODUODENOSCOPY (EGD) WITH PROPOFOL N/A 12/20/2020   Procedure: ESOPHAGOGASTRODUODENOSCOPY (EGD) WITH PROPOFOL;  Surgeon: Earline Mayotte, MD;  Location: [facility] ENDOSCOPY;  Service: Endoscopy;  Laterality: N/A;   LEFT HEART CATH AND CORONARY ANGIOGRAPHY Left 04/20/2019   Procedure: LEFT HEART CATH AND CORONARY ANGIOGRAPHY;  Surgeon: Marcina Millard, MD;  Location: [facility] INVASIVE CV LAB;  Service: Cardiovascular;  Laterality: Left;    Allergies  Allergen Reactions   Ibuprofen Shortness Of Breath   Nsaids Swelling    Swelling of throat    Prior to Admission medications  Medication Sig Start Date End Date Taking? Authorizing Provider  amLODipine (NORVASC) 5 MG tablet Take 1 tablet (5 mg total) by mouth daily. 04/21/19  Yes Leanora Ivanoff, PA-C  aspirin 81 MG chewable tablet Chew 1 tablet (81 mg total) by mouth daily. 04/21/19  Yes Leanora Ivanoff, PA-C  atorvastatin (LIPITOR) 80 MG tablet  Take 1 tablet (80 mg total) by mouth daily at 6 PM. 04/21/19  Yes Leanora Ivanoff, PA-C  busPIRone (BUSPAR) 7.5 MG tablet Take 7.5 mg by mouth 2 (two) times daily.   Yes [provider]  cetirizine (ZYRTEC) 10 MG tablet Take 10 mg by mouth daily.   Yes [provider]  chlorpheniramine-HYDROcodone (TUSSIONEX) 10-8 MG/5ML Take 5 mLs by mouth every 12 (twelve) hours as needed for cough. 11/06/22  Yes Irean Hong, MD  cyclobenzaprine (FLEXERIL) 10 MG tablet TAKE 1/2 TO 1 (ONE-HALF TO ONE) TABLET BY MOUTH PER DAY OR TWICE DAILY IF TOLERATED. 06/08/20  Yes [provider]  diclofenac Sodium (VOLTAREN) 1 % GEL Apply 2 g topically 4 (four) times daily.   Yes [provider]  fluticasone (FLONASE) 50 MCG/ACT nasal spray Place 1 spray into both nostrils 2 (two) times daily as needed (nasal congestion).   Yes [provider]  Fluticasone-Umeclidin-Vilant 200-62.5-25 MCG/INH AEPB Inhale into the lungs. 04/24/20  Yes [provider]  HUMALOG KWIKPEN 100 UNIT/ML KwikPen Inject 70 Units into the skin 3 (three) times daily.   Yes [provider]  isosorbide mononitrate (IMDUR) 30 MG 24 hr tablet Take 30 mg by mouth daily.   Yes [provider]  lisinopril (ZESTRIL) 20 MG tablet Take 1 tablet (20 mg total) by mouth daily. 04/21/19  Yes Leanora Ivanoff, PA-C  metFORMIN (GLUCOPHAGE) 1000 MG tablet Take 1,000 mg by mouth 2 (two) times daily.    Yes [provider]  metoCLOPramide (REGLAN) 5 MG tablet Take 5 mg by mouth 2 (two) times daily.   Yes [provider]  metoprolol succinate (TOPROL-XL) 50 MG 24 hr tablet Take 50 mg by mouth daily. Take with or immediately following a meal.   Yes [provider]  montelukast (SINGULAIR) 10 MG tablet Take by mouth. 12/15/19 05/08/23 Yes [provider]  Multiple Vitamins-Minerals (MULTIVITAMIN WITH MINERALS) tablet Take 1 tablet by mouth 2 (two) times daily.    Yes [provider]  mupirocin ointment (BACTROBAN) 2 % SMARTSIG:1 Application Topical 2-3 Times Daily 04/06/20  Yes [provider]  Omega-3 Fatty Acids (FISH OIL) 500 MG CAPS Take 500 mg by mouth 2 (two) times daily.   Yes [provider]  ondansetron (ZOFRAN-ODT) 4 MG disintegrating tablet Take 1 tablet (4 mg total) by mouth every 8 (eight) hours as needed for nausea or vomiting. 04/25/23  Yes Willy Eddy, MD  pantoprazole (PROTONIX) 40 MG tablet Take 40 mg by mouth 2 (two) times daily.  04/07/15  Yes [provider]  pantoprazole (PROTONIX) 40 MG tablet Take 1 tablet (40 mg total) by mouth daily. 04/25/23 04/24/24 Yes Willy Eddy, MD  pioglitazone (ACTOS) 15 MG tablet Take 15 mg by mouth 2 (two) times daily.   Yes [provider]  prasugrel (EFFIENT) 10 MG TABS tablet Take 1 tablet (10 mg total) by mouth daily. 04/21/19  Yes Leanora Ivanoff, PA-C  sucralfate (CARAFATE) 1 GM/10ML suspension Take 1 g by mouth 4 (four) times daily -  with meals and at bedtime. 04/30/23 04/29/24 Yes [provider]  HYDROcodone-acetaminophen (NORCO/VICODIN) 5-325 MG tablet Limit  1 tab by mouth per day or twice a day if tolerated Patient taking differently: Take 0.5-1 tablets by mouth every 4 (four) hours as needed (pain.). 01/04/16   Ewing Schlein, MD  insulin regular human CONCENTRATED (HUMULIN R U-500 KWIKPEN) 500 UNIT/ML kwikpen Inject 30 Units into the skin 2 (two) times daily. Patient not taking: Reported on 05/08/2023    [provider]  Ipratropium-Albuterol (COMBIVENT) 20-100 MCG/ACT AERS respimat Inhale into the lungs. 12/15/19 12/14/20  [provider]  predniSONE (DELTASONE) 20 MG tablet 3 tablets daily x 4 days Patient not taking: Reported on 05/08/2023 11/06/22   Irean Hong, MD  silver sulfADIAZINE (SILVADENE) 1 % cream Apply 1 application topically daily. Patient not taking: Reported on 05/08/2023 05/30/20   Schnier, Latina Craver, MD  tadalafil (CIALIS) 20  MG tablet Take 20 mg by mouth daily as needed for erectile dysfunction. Patient not taking: Reported on 05/08/2023    [provider]  TRESIBA FLEXTOUCH 200 UNIT/ML FlexTouch Pen Inject 142 Units into the skin at bedtime.    [provider]    Social History   Socioeconomic History   Marital status: Married    Spouse name: Not on file   Number of children: Not on file   Years of education: Not on file   Highest education level: Not on file  Occupational History   Not on file  Tobacco Use   Smoking status: Never   Smokeless tobacco: Never  Substance and Sexual Activity   Alcohol use: Yes    Alcohol/week: 0.0 standard drinks of alcohol    Comment: occasionally   Drug use: No   Sexual activity: Not Currently  Other Topics Concern   Not on file  Social History Narrative   Not on file   Social Drivers of Health   Financial Resource Strain: Low Risk  (02/18/2023)   Received from [facility]   Overall Financial Resource Strain (CARDIA)    Difficulty of Paying Living Expenses: Not hard at all  Recent Concern: Financial Resource Strain - High Risk (12/24/2022)   Received from [facility]   Overall Financial Resource Strain (CARDIA)    Difficulty of Paying Living Expenses: Very hard  Food Insecurity: No Food Insecurity (05/08/2023)   Hunger Vital Sign    Worried About Running Out of Food in the Last Year: Never true    Ran Out of Food in the Last Year: Never true  Recent Concern: Food Insecurity - Food Insecurity Present (02/18/2023)   Received from [facility]   Hunger Vital Sign    Worried About Running Out of Food in the Last Year: Sometimes true    Ran Out of Food in the Last Year: Sometimes true  Transportation Needs: No Transportation Needs (05/08/2023)   PRAPARE - Administrator, Civil Service (Medical): No    Lack of Transportation (Non-Medical): No  Physical Activity: Insufficiently  Active (04/20/2019)   Exercise Vital Sign    Days of Exercise per Week: 1 day    Minutes of Exercise per Session: 10 min  Stress: No Stress Concern Present (04/20/2019)   [employer] of Occupational Health - Occupational Stress Questionnaire    Feeling of Stress : Not at all  Social Connections: Moderately Isolated (04/20/2019)   Social Connection and Isolation Panel [NHANES]    Frequency of Communication with Friends and Family: Three times a week    Frequency of Social Gatherings with Friends and Family:  Once a week    Attends Religious Services: Never    Active Member of Clubs or Organizations: No    Attends Banker Meetings: Never    Marital Status: Married  Catering manager Violence: Not At Risk (05/08/2023)   Humiliation, Afraid, Rape, and Kick questionnaire    Fear of Current or Ex-Partner: No    Emotionally Abused: No    Physically Abused: No    Sexually Abused: No     Family History  Problem Relation Age of Onset   Heart disease Mother        CABG   Lung cancer Mother        Disseminated   Hypertension Mother    Hyperlipidemia Mother    Diabetes Father        mother   Colon cancer Father 42   Cancer Maternal Aunt        x3 aunts, unk types of cancer   Colon cancer Maternal Uncle        x3 uncles   Prostate cancer Maternal Uncle        x4 uncles   Cancer Paternal Uncle        prostate vs colon   Alzheimer's disease Other    Kidney disease Neg Hx     ROS: Otherwise negative unless mentioned in HPI  Physical Examination  Vitals:   05/08/23 0057 05/08/23 0807  BP: (!) 162/77 (!) 156/94  Pulse: 90 88  Resp: 18 16  Temp: 98.6 F (37 C) 99.4 F (37.4 C)  SpO2: 100% 99%   Body mass index is 33.72 kg/m.  General:  WDWN in NAD Gait: Not observed HENT: WNL, normocephalic Pulmonary: normal non-labored breathing, without Rales, rhonchi,  wheezing Cardiac: regular, without  Murmurs, rubs or gallops; without carotid bruits Abdomen:  Positive bowel sounds throughout, soft, NT/ND, no masses Skin: without rashes Vascular Exam/Pulses: Positive Doppler pulses but weak in both DP and PT.  Pulses appear flattened monophasic. Extremities: with ischemic changes, with Gangrene , with cellulitis; with open wounds;  Musculoskeletal: no muscle wasting or atrophy.  Black discolored patient to his right foot at the second toe.  Drainage noted from ulceration that has been chronic.  Redness and swelling to the right foot.  Please see media pictures placed in the file under Todd Cline's consult note.  Neurologic: A&O X 3;  No focal weakness or paresthesias are detected; speech is fluent/normal Psychiatric:  The pt has Normal affect. Lymph:  Unremarkable  CBC    Component Value Date/Time   WBC 12.8 (H) 05/08/2023 0431   RBC 3.59 (L) 05/08/2023 0431   HGB 10.4 (L) 05/08/2023 0431   HCT 29.9 (L) 05/08/2023 0431   HCT 48.9 07/31/2015 0838   PLT 407 (H) 05/08/2023 0431   MCV 83.3 05/08/2023 0431   MCH 29.0 05/08/2023 0431   MCHC 34.8 05/08/2023 0431   RDW 12.5 05/08/2023 0431   LYMPHSABS 1.8 05/07/2023 1921   MONOABS 0.7 05/07/2023 1921   EOSABS 0.1 05/07/2023 1921   BASOSABS 0.0 05/07/2023 1921    BMET    Component Value Date/Time   NA 133 (L) 05/08/2023 0431   K 3.5 05/08/2023 0431   CL 100 05/08/2023 0431   CO2 22 05/08/2023 0431   GLUCOSE 205 (H) 05/08/2023 0431   BUN 10 05/08/2023 0431   CREATININE 0.91 05/08/2023 0431   CALCIUM 7.9 (L) 05/08/2023 0431   GFRNONAA >60 05/08/2023 0431   GFRAA >60 04/21/2019 0454  COAGS: Lab Results  Component Value Date   INR 1.0 03/21/2021     Non-Invasive Vascular Imaging:   EXAM:05/08/23 MRI OF THE RIGHT FOREFOOT WITHOUT CONTRAST   TECHNIQUE: Multiplanar, multisequence MR imaging of the right forefoot was performed. No intravenous contrast was administered.   COMPARISON:  Radiographs same date. No other comparison studies available.   FINDINGS: Technical note:  Despite efforts by the technologist and patient, mild motion artifact is present on today's exam and could not be eliminated. This reduces exam sensitivity and specificity.   Bones/Joint/Cartilage   As shown on same-day radiographs, there is attenuation of the soft tissues of the 2nd toe. No underlying cortical destruction or marrow signal abnormality is identified to suggest osteomyelitis. There is no evidence of acute fracture or dislocation. No significant joint effusions are identified. The alignment is normal at the Lisfranc joint.   Ligaments   Intact Lisfranc ligament. The collateral ligaments of the metatarsophalangeal joints appear intact.   Muscles and Tendons   Generalized forefoot muscular atrophy. Dorsal soft tissue emphysema with prominent fluid in the extensor tendon sheaths of the 2nd ray. There is a small amount of fluid within the flexor digitorum tendon sheaths.   Soft tissues   As seen on radiographs, there is extensive soft tissue emphysema within the medial aspect of the forefoot, extending into the 1st and 2nd web spaces. There is associated ill-defined surrounding fluid, greatest dorsally. This process extends proximally to the level of the mid 2nd metatarsal. These findings remain highly concerning for necrotizing soft tissue infection. No well-defined soft tissue abscess.   IMPRESSION: 1. Extensive soft tissue emphysema within the medial aspect of the forefoot, extending into the 1st and 2nd web spaces, with associated ill-defined surrounding fluid, greatest dorsally. These findings remain highly concerning for necrotizing soft tissue infection. No focal abscess identified. 2. No evidence of osteomyelitis or septic arthritis. 3. Prominent fluid in the extensor tendon sheaths of the 2nd ray, likely septic tenosynovitis.  Statin:  Yes.   Beta Blocker:  Yes.   Aspirin:  Yes.   ACEI:  Yes.   ARB:  No. CCB use:  Yes Other  antiplatelets/anticoagulants:  No.     ASSESSMENT/PLAN: This is a 57 y.o. male who presents to [facility] emergency department with chronic ulcer to his right foot.  This includes the third toe of his right foot.  Upon examination in the emergency room patient underwent an MRI of his foot.  This revealed extensive soft tissue emphysema in the medial aspect of the forefoot extending to the first and second web spaces.  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  Patient was taken to the operating room by Dr. Linus Galas for gas gangrene of the right foot this afternoon.  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  Vascular surgery was consulted to assess blood flow to the patient's lower extremity with the possibility that if the patient does not heal he may need a below the knee amputation.  Also to assess and reestablish blood flow if possible for limb saving.  Patient was made aware of this by Dr. Linus Galas and myself today.  Plan Vascular surgery plans on taking the patient to the vascular lab on 05/09/2023 for a right lower extremity angiogram with possible intervention.  I discussed in detail at the bedside with the patient today the procedure, benefits, risk, and complications.  Patient verbalizes understanding.  Patient would like to proceed as soon as possible.  I answered all the patient's questions this afternoon.  Patient was made n.p.o. after midnight for procedure tomorrow.   I discussed the plan in detail with Dr. Elbert Ewings MD and he agrees with the plan.   Marcie Bal [facility] 05/08/2023 9:11 AM

## 2023-05-08 NOTE — Interval H&P Note (Signed)
History and Physical Interval Note:  05/08/2023 11:09 AM  Gerald Ellis  has presented today for surgery, with the diagnosis of gangrene foot.  The various methods of treatment have been discussed with the patient and family. After consideration of risks, benefits and other options for treatment, the patient has consented to  Procedure(s): TRANSMETATARSAL AMPUTATION (Right) as a surgical intervention.  The patient's history has been reviewed, patient examined, no change in status, stable for surgery.  I have reviewed the patient's chart and labs.  Questions were answered to the patient's satisfaction.     [name]

## 2023-05-08 NOTE — Plan of Care (Signed)
  Problem: Education: Goal: Ability to describe self-care measures that may prevent or decrease complications (Diabetes Survival Skills Education) will improve Outcome: Progressing Goal: Individualized Educational Video(s) Outcome: Progressing   Problem: Coping: Goal: Ability to adjust to condition or change in health will improve Outcome: Progressing   Problem: Fluid Volume: Goal: Ability to maintain a balanced intake and output will improve Outcome: Progressing   Problem: Metabolic: Goal: Ability to maintain appropriate glucose levels will improve Outcome: Progressing   Problem: Health Behavior/Discharge Planning: Goal: Ability to identify and utilize available resources and services will improve Outcome: Progressing Goal: Ability to manage health-related needs will improve Outcome: Progressing   Problem: Skin Integrity: Goal: Risk for impaired skin integrity will decrease Outcome: Progressing   Problem: Tissue Perfusion: Goal: Adequacy of tissue perfusion will improve Outcome: Progressing

## 2023-05-08 NOTE — Progress Notes (Addendum)
PROGRESS NOTE    [name]  EXB:[id number] DOB: [birth year] DOA: 05/07/2023 PCP: Jerl Mina, MD  Outpatient Specialists: podiatry, endo, cardiology    Brief Narrative:   From admission h and p Gerald Ellis is a 57 y.o. male with medical history significant for HTN, HLD, CAD s/p stent, DM with neuropathy, chronic back pain on opiates,, anxiety, chronic ulcer first right MTP, followed by podiatry (last seen 02/18/2023), a with normal resting ABI at the time and treated with topical dressings who presents to the ED with a 1 week history of blistering in the area of the wound and then over the past day he noted that his second and third toes of the right foot were turning black.  He denies fever or chills.    Assessment & Plan:   Principal Problem:   Diabetic foot infection (HCC) Active Problems:   Sepsis (HCC)   BPH with obstruction/lower urinary tract symptoms   CAD S/P percutaneous coronary angioplasty   HTN (hypertension)   PAD (peripheral artery disease) (HCC)   Chronic back pain   Anxiety   Obesity, Class III, BMI 40-49.9 (morbid obesity) (HCC)   Uncontrolled type 2 diabetes mellitus with hyperglycemia, with long-term current use of insulin (HCC)  # Diabetic foot infection # Sepsis # PAD Sepsis by fever, tachycardia, leukocytosis - continue fluids - continue zosyn/vanc, can d/c flagyl - for angiogram today with TM amputation following that - continue statin  # T2DM Uncontrolled - dm educator consult - basal/bolus/ssi insulin  # Obesity Noted  # Anxiety - home buspar  # Chronic low back pain - home gabapentin, hydrocodone, flexeril  # HTN Bp elevated moderately - cont home , amlodipine, metop - holding lisinopril  # CAD S/p DES x3 in 2020, no chest pain here - cont home asa, atorva, imdur, metop, , effient   DVT prophylaxis: none right now Code Status: full Family Communication: none @ bedside  Level of care: Med-Surg Status is:  Inpatient Remains inpatient appropriate because: severity of illness    Consultants:  Podiatry, vascular  Procedures: pending  Antimicrobials:  See above   Subjective: Reports mild foot pain otherwise feeling well  Objective: Vitals:   05/07/23 2330 05/08/23 0000 05/08/23 0057 05/08/23 0807  BP:  (!) 149/82 (!) 162/77 (!) 156/94  Pulse:  87 90 88  Resp:   18 16  Temp: 99.5 F (37.5 C)  98.6 F (37 C) 99.4 F (37.4 C)  TempSrc: Oral     SpO2:  96% 100% 99%  Weight:      Height:        Intake/Output Summary (Last 24 hours) at 05/08/2023 0944 Last data filed at 05/07/2023 2246 Gross per 24 hour  Intake 1050 ml  Output --  Net 1050 ml   Filed Weights   05/07/23 1918  Weight: 106.6 kg    Examination:  General exam: Appears calm and comfortable  Respiratory system: Clear to auscultation. Respiratory effort normal. Cardiovascular system: S1 & S2 heard, RRR.   Gastrointestinal system: Abdomen is nondistended, soft and nontender.   Central nervous system: Alert and oriented. No focal neurological deficits. Extremities: Symmetric 5 x 5 power. Skin: No rashes, lesions or ulcers. Right foot is wrapped Psychiatry: Judgement and insight appear normal. Mood & affect appropriate.     Data Reviewed: I have personally reviewed following labs and imaging studies  CBC: Recent Labs  Lab 05/07/23 1921 05/08/23 0431  WBC 15.0* 12.8*  NEUTROABS 12.3*  --  HGB 12.1* 10.4*  HCT 36.3* 29.9*  MCV 87.1 83.3  PLT 491* 407*   Basic Metabolic Panel: Recent Labs  Lab 05/07/23 1921 05/08/23 0431  NA 130* 133*  K 4.1 3.5  CL 94* 100  CO2 25 22  GLUCOSE 396* 205*  BUN 12 10  CREATININE 0.98 0.91  CALCIUM 8.4* 7.9*   GFR: Estimated Creatinine Clearance: 110.8 mL/min (by C-G formula based on SCr of 0.91 mg/dL). Liver Function Tests: Recent Labs  Lab 05/07/23 1921  AST 36  ALT 41  ALKPHOS 97  BILITOT 0.9  PROT 8.3*  ALBUMIN 2.6*   No results for input(s):  "LIPASE", "AMYLASE" in the last 168 hours. No results for input(s): "AMMONIA" in the last 168 hours. Coagulation Profile: No results for input(s): "INR", "PROTIME" in the last 168 hours. Cardiac Enzymes: No results for input(s): "CKTOTAL", "CKMB", "CKMBINDEX", "TROPONINI" in the last 168 hours. BNP (last 3 results) No results for input(s): "PROBNP" in the last 8760 hours. HbA1C: Recent Labs    05/07/23 1921  HGBA1C 11.2*   CBG: Recent Labs  Lab 05/08/23 0057 05/08/23 0424 05/08/23 0809  GLUCAP 312* 226* 122*   Lipid Profile: No results for input(s): "CHOL", "HDL", "LDLCALC", "TRIG", "CHOLHDL", "LDLDIRECT" in the last 72 hours. Thyroid Function Tests: No results for input(s): "TSH", "T4TOTAL", "FREET4", "T3FREE", "THYROIDAB" in the last 72 hours. Anemia Panel: No results for input(s): "VITAMINB12", "FOLATE", "FERRITIN", "TIBC", "IRON", "RETICCTPCT" in the last 72 hours. Urine analysis:    Component Value Date/Time   COLORURINE YELLOW (A) 06/12/2015 1252   APPEARANCEUR CLEAR (A) 06/12/2015 1252   LABSPEC 1.042 (H) 06/12/2015 1252   PHURINE 6.0 06/12/2015 1252   GLUCOSEU >500 (A) 06/12/2015 1252   HGBUR NEGATIVE 06/12/2015 1252   BILIRUBINUR NEGATIVE 06/12/2015 1252   KETONESUR TRACE (A) 06/12/2015 1252   PROTEINUR 100 (A) 06/12/2015 1252   NITRITE NEGATIVE 06/12/2015 1252   LEUKOCYTESUR NEGATIVE 06/12/2015 1252   Sepsis Labs: @LABRCNTIP (procalcitonin:4,lacticidven:4)  ) Recent Results (from the past 240 hours)  Blood culture (single)     Status: None (Preliminary result)   Collection Time: 05/07/23  7:21 PM   Specimen: BLOOD  Result Value Ref Range Status   Specimen Description BLOOD LEFT FOREARM  Final   Special Requests   Final    BOTTLES DRAWN AEROBIC AND ANAEROBIC Blood Culture adequate volume   Culture   Final    NO GROWTH < 12 HOURS Performed at [facility], [address] [place], [place] [address]    Report Status PENDING  Incomplete          Radiology Studies: DG Foot Complete Right Result Date: 05/07/2023 CLINICAL DATA:  Foot infection second toe is black EXAM: RIGHT FOOT COMPLETE - 3+ VIEW COMPARISON:  Report 04/06/2020 FINDINGS: No fracture or malalignment. Wound or ulcer plantar aspect of the distal foot. Gas within the soft tissues diffusely surrounding the second digit from the level of the distal metatarsal to the distal phalanx. Additional gas present within the soft tissues of the medial first digit, between the second and third digits, and between the fourth and fifth digits. No obvious osseous destructive change. IMPRESSION: Wound or ulcer plantar aspect of the distal foot with gas within the soft tissues diffusely surrounding the second digit and additional gas present within the soft tissues of the medial first digit, between the second and third digits, and between the fourth and fifth digits. Findings are suspicious for necrotic soft tissue infection. No definite acute osseous  abnormality. Electronically Signed   By: Jasmine Pang M.D.   On: 05/07/2023 21:06        Scheduled Meds:  aspirin  81 mg Oral Daily   atorvastatin  80 mg Oral q1800   busPIRone  7.5 mg Oral BID   insulin aspart  0-20 Units Subcutaneous Q4H   isosorbide mononitrate  30 mg Oral Daily   lisinopril  20 mg Oral Daily   metoprolol succinate  50 mg Oral Daily   omega-3 acid ethyl esters  1 g Oral BID   pantoprazole  40 mg Oral BID   Continuous Infusions:  ceFEPime (MAXIPIME) IV 2 g (05/08/23 0319)   lactated ringers 150 mL/hr (05/08/23 0106)   metronidazole 500 mg (05/08/23 0436)   vancomycin       LOS: 1 day     Silvano Bilis, MD [facility]   If 7PM-7AM, please contact night-coverage [email or web address] Password [facility] 05/08/2023, 9:44 AM

## 2023-05-08 NOTE — Inpatient Diabetes Management (Addendum)
Inpatient Diabetes Program Recommendations  AACE/ADA: New Consensus Statement on Inpatient Glycemic Control  Target Ranges:  Prepandial:   less than 140 mg/dL      Peak postprandial:   less than 180 mg/dL (1-2 hours)      Critically ill patients:  140 - 180 mg/dL    Latest Reference Range & Units 05/08/23 00:57 05/08/23 04:24 05/08/23 08:09 05/08/23 10:40 05/08/23 13:06  Glucose-Capillary 70 - 99 mg/dL 161 (H) 096 (H) 045 (H) 108 (H) 195 (H)    Latest Reference Range & Units 05/07/23 19:21  Glucose 70 - 99 mg/dL 409 (H)   Review of Glycemic Control  Diabetes history: DM2 Outpatient Diabetes medications: Humalog 70 units TID with meals, Tresiba 142 units at bedtime, Metformin 1000 mg BID, Actos 15 mg BID Current orders for Inpatient glycemic control: Semglee 30 units daily at bedtime, Novolog 0-20 units Q4H  NOTE: Diabetes coordinator working remotely.  Patient admitted with diabetic foot infection, sepsis, and hyperglycemia; initial glucose 396 mg/dl on 81/19/14. Per chart, patient sees Dr. Tedd Sias (Endocrinologist) and was last seen 12/24/22 and per office note patient is prescribed Tresiba 150 units at bedtime, Humalog 70 units TID with meals and Metformin 1000 mg BID for DM control. Patient is currently in PACU following transmetatarsal amputation; noted patient received Decadron 5 mg at 11:31 am today which is going to contribute to hyperglycemia. Inpatient diabetes coordinator will follow up with patient on Friday 05/09/23 regarding DM control.  Thanks, Orlando Penner, RN, MSN, CDCES Diabetes Coordinator Inpatient Diabetes Program [phone] (Team Pager from 8am to 5pm)

## 2023-05-08 NOTE — Anesthesia Postprocedure Evaluation (Signed)
Anesthesia Post Note  Patient: Gerald Ellis  Procedure(s) Performed: TRANSMETATARSAL AMPUTATION (Right: Foot)  Patient location during evaluation: PACU Anesthesia Type: General Level of consciousness: awake and alert Pain management: pain level controlled Vital Signs Assessment: post-procedure vital signs reviewed and stable Respiratory status: spontaneous breathing, nonlabored ventilation, respiratory function stable and patient connected to nasal cannula oxygen Cardiovascular status: blood pressure returned to baseline and stable Postop Assessment: no apparent nausea or vomiting Anesthetic complications: no   No notable events documented.   Last Vitals:  Vitals:   05/08/23 1335 05/08/23 1340  BP:    Pulse: 85 87  Resp: 20 (!) 25  Temp:    SpO2: 96% 94%    Last Pain:  Vitals:   05/08/23 1340  TempSrc:   PainSc: 7                  Cleda Mccreedy Louden Houseworth

## 2023-05-08 NOTE — Op Note (Signed)
Date of operation: 05/08/2023.  Surgeon: [name] D.P.M.  Preoperative diagnosis: Gas gangrene right forefoot.  Postoperative diagnosis: Same.  Procedure: Transmetatarsal amputation right foot.  Anesthesia: LMA.  Hemostasis: Pneumatic tourniquet right ankle 250 mmHg.  Estimated blood loss: Less than 5 cc.  Injectables: 10 cc 0.5% Marcaine plain.  Cultures: Deep wound cultures right foot.  Pathology: Right forefoot.  Complications: None apparent.  Operative indications: This is a 57 year old diabetic male whose had a history of ulceration on his right foot for some time.  Recently within the last few days progressive black discoloration noted in the second toe and forefoot.  Presented to the emergency department where he was admitted for gangrene in the right forefoot.  Decision made for emergent surgery due to the extent of his gangrenous changes and infection.  Operative procedure: Patient was taken to the operating room and placed on the table in the supine position.  Following satisfactory LMA anesthesia a pneumatic tourniquet was applied at the level of the right ankle and the foot was prepped and draped in the usual sterile fashion.  Foot was exsanguinated and the tourniquet inflated to 250 mmHg.  Attention was directed to the distal aspect of the right foot where an incision was made from laterally along the fifth metatarsal extending along the dorsum of the foot just proximal to the gangrenous changes and ending medially along the side of the first metatarsal.  Similar incision was made plantarly.  Incisions were carried sharply down to the level of the bone and dissection carried back proximally using a key elevator.  Heavy purulence was noted in the forefoot and cultures were taken for sensitivities.  Dorsal and plantar flaps created and then each of the metatarsals was incised sharply using a sagittal saw and the forefoot was removed in toto.  Some significant necrotic tissue  was noted on both the plantar and dorsal flaps.  Devitalized tissue was thoroughly debrided using a Versajet debrider on a setting of 4.  The wound was then flushed with pulse irrigation using 3 L of saline.  The incisions were then reapproximated using 2-0 Vicryl simple interrupted sutures for deep closure followed by superficial closure using 3-0 Vicryl simple erupted sutures followed by skin staples for skin closure.  Betadine Xeroform 4 x 4's ABDs and Kerlix applied to the right foot.  Tourniquet was released.  Second Kerlix and Ace wrap then applied for compression.  Patient was awakened and transported to the PACU having tolerated the anesthesia and procedure well.

## 2023-05-08 NOTE — Anesthesia Procedure Notes (Signed)
Procedure Name: Intubation Date/Time: 05/08/2023 11:28 AM  Performed by: Mohammed Kindle, CRNAPre-anesthesia Checklist: Patient identified, Emergency Drugs available, Suction available and Patient being monitored Patient Re-evaluated:Patient Re-evaluated prior to induction Oxygen Delivery Method: Circle system utilized Preoxygenation: Pre-oxygenation with 100% oxygen Induction Type: IV induction Ventilation: Mask ventilation without difficulty Laryngoscope Size: McGrath and 3 Grade View: Grade I Tube type: Oral Tube size: 7.0 mm Number of attempts: 1 Airway Equipment and Method: Stylet Placement Confirmation: ETT inserted through vocal cords under direct vision, positive ETCO2, breath sounds checked- equal and bilateral and CO2 detector Secured at: 21 cm Tube secured with: Tape Dental Injury: Teeth and Oropharynx as per pre-operative assessment

## 2023-05-08 NOTE — Plan of Care (Signed)
  Problem: Education: Goal: Knowledge of General Education information will improve Description: Including pain rating scale, medication(s)/side effects and non-pharmacologic comfort measures Outcome: Progressing   Problem: Activity: Goal: Risk for activity intolerance will decrease Outcome: Progressing   Problem: Elimination: Goal: Will not experience complications related to urinary retention Outcome: Progressing   Problem: Safety: Goal: Ability to remain free from injury will improve Outcome: Progressing

## 2023-05-08 NOTE — Anesthesia Preprocedure Evaluation (Signed)
Anesthesia Evaluation  Patient identified by MRN, date of birth, ID band Patient awake    Reviewed: Allergy & Precautions, H&P , NPO status , Patient's Chart, lab work & pertinent test results, reviewed documented beta blocker date and time   Airway Mallampati: III  TM Distance: >3 FB Neck ROM: full    Dental  (+) Chipped   Pulmonary asthma    Pulmonary exam normal        Cardiovascular Exercise Tolerance: Good hypertension, On Medications + Peripheral Vascular Disease  Normal cardiovascular exam Rhythm:regular Rate:Normal     Neuro/Psych  PSYCHIATRIC DISORDERS Anxiety Depression    negative neurological ROS     GI/Hepatic negative GI ROS, Neg liver ROS,GERD  Medicated and Poorly Controlled,,  Endo/Other  negative endocrine ROSdiabetes, Poorly Controlled    Renal/GU      Musculoskeletal   Abdominal   Peds  Hematology negative hematology ROS (+)   Anesthesia Other Findings Patient states he has bad acid reflex and noted this morning he was feeling very bloated and wanted to throw up. Discussed risks and benefits of ETT for procedure. Patient states he understands and agrees to proceed. Will plan on RSI.  Past Medical History: No date: Benign enlargement of prostate No date: Bronchitis No date: Childhood asthma No date: Diabetes mellitus without complication (HCC) No date: Environmental allergies No date: Erectile dysfunction No date: Headache No date: Hemorrhoids No date: Hypercholesteremia No date: Hypertension No date: Hypogonadism in male No date: Lumbago No date: Over weight  Past Surgical History: 1992: BACK SURGERY No date: CHOLECYSTECTOMY 08/07/2015: COLONOSCOPY WITH PROPOFOL; N/A     Comment:  Procedure: COLONOSCOPY WITH PROPOFOL;  Surgeon: Christena Deem, MD;  Location: [facility] ENDOSCOPY;  Service:               Endoscopy;  Laterality: N/A; 08/08/2015: COLONOSCOPY WITH PROPOFOL;  N/A     Comment:  Procedure: COLONOSCOPY WITH PROPOFOL;  Surgeon: Christena Deem, MD;  Location: [facility] ENDOSCOPY;  Service:               Endoscopy;  Laterality: N/A; 09/16/2017: COLONOSCOPY WITH PROPOFOL; N/A     Comment:  Procedure: COLONOSCOPY WITH PROPOFOL;  Surgeon:               Christena Deem, MD;  Location: [facility] ENDOSCOPY;                Service: Endoscopy;  Laterality: N/A; 12/20/2020: COLONOSCOPY WITH PROPOFOL; N/A     Comment:  Procedure: COLONOSCOPY WITH PROPOFOL;  Surgeon: Earline Mayotte, MD;  Location: [facility] ENDOSCOPY;  Service:               Endoscopy;  Laterality: N/A;  IDDM 04/20/2019: CORONARY STENT INTERVENTION; N/A     Comment:  Procedure: CORONARY STENT INTERVENTION;  Surgeon:               Marcina Millard, MD;  Location: [facility] INVASIVE CV               LAB;  Service: Cardiovascular;  Laterality: N/A; 08/07/2015: ESOPHAGOGASTRODUODENOSCOPY (EGD) WITH PROPOFOL; N/A     Comment:  Procedure: ESOPHAGOGASTRODUODENOSCOPY (EGD) WITH               PROPOFOL;  Surgeon: Cindra Eves  Marva Panda, MD;  Location:               [facility] ENDOSCOPY;  Service: Endoscopy;  Laterality: N/A; 09/16/2017: ESOPHAGOGASTRODUODENOSCOPY (EGD) WITH PROPOFOL; N/A     Comment:  Procedure: ESOPHAGOGASTRODUODENOSCOPY (EGD) WITH               PROPOFOL;  Surgeon: Christena Deem, MD;  Location:               [facility] ENDOSCOPY;  Service: Endoscopy;  Laterality: N/A; 12/20/2020: ESOPHAGOGASTRODUODENOSCOPY (EGD) WITH PROPOFOL; N/A     Comment:  Procedure: ESOPHAGOGASTRODUODENOSCOPY (EGD) WITH               PROPOFOL;  Surgeon: Earline Mayotte, MD;  Location:               [facility] ENDOSCOPY;  Service: Endoscopy;  Laterality: N/A; 04/20/2019: LEFT HEART CATH AND CORONARY ANGIOGRAPHY; Left     Comment:  Procedure: LEFT HEART CATH AND CORONARY ANGIOGRAPHY;                Surgeon: Marcina Millard, MD;  Location: [facility]               INVASIVE CV LAB;  Service: Cardiovascular;   Laterality:               Left;  BMI    Body Mass Index: 33.72 kg/m      Reproductive/Obstetrics negative OB ROS                             Anesthesia Physical Anesthesia Plan  ASA: 3  Anesthesia Plan: General ETT and General   Post-op Pain Management:    Induction: Intravenous and Rapid sequence  PONV Risk Score and Plan: 2 and Ondansetron, Dexamethasone and Midazolam  Airway Management Planned: Oral ETT  Additional Equipment:   Intra-op Plan:   Post-operative Plan: Extubation in OR  Informed Consent: I have reviewed the patients History and Physical, chart, labs and discussed the procedure including the risks, benefits and alternatives for the proposed anesthesia with the patient or authorized representative who has indicated his/her understanding and acceptance.     Dental Advisory Given  Plan Discussed with: Anesthesiologist, CRNA and Surgeon  Anesthesia Plan Comments: (Patient consented for risks of anesthesia including but not limited to:  - adverse reactions to medications - damage to eyes, teeth, lips or other oral mucosa - nerve damage due to positioning  - sore throat or hoarseness - Damage to heart, brain, nerves, lungs, other parts of body or loss of life  Patient voiced understanding and assent.)       Anesthesia Quick Evaluation

## 2023-05-08 NOTE — Consult Note (Signed)
Reason for Consult: Gas gangrene right foot. Referring Physician: Eder Strome is an 57 y.o. male.  HPI: This is a 57 year old male with significant history of diabetes with neuropathy.  States he has had a chronic ulcer on his right foot.  In the past he has seen Dr. Excell Seltzer but his last appointment was a couple of months ago.  States he had to miss his follow-ups due to other medical issues.  States that a few days ago he noticed a discolored area on his second toe and that yesterday the entire toe had become black.  Presented to the emergency department for evaluation.  Past Medical History:  Diagnosis Date   Benign enlargement of prostate    Bronchitis    Childhood asthma    Diabetes mellitus without complication (HCC)    Environmental allergies    Erectile dysfunction    Headache    Hemorrhoids    Hypercholesteremia    Hypertension    Hypogonadism in male    Lumbago    Over weight     Past Surgical History:  Procedure Laterality Date   BACK SURGERY  1992   CHOLECYSTECTOMY     COLONOSCOPY WITH PROPOFOL N/A 08/07/2015   Procedure: COLONOSCOPY WITH PROPOFOL;  Surgeon: Christena Deem, MD;  Location: [facility] ENDOSCOPY;  Service: Endoscopy;  Laterality: N/A;   COLONOSCOPY WITH PROPOFOL N/A 08/08/2015   Procedure: COLONOSCOPY WITH PROPOFOL;  Surgeon: Christena Deem, MD;  Location: [facility] ENDOSCOPY;  Service: Endoscopy;  Laterality: N/A;   COLONOSCOPY WITH PROPOFOL N/A 09/16/2017   Procedure: COLONOSCOPY WITH PROPOFOL;  Surgeon: Christena Deem, MD;  Location: [facility] ENDOSCOPY;  Service: Endoscopy;  Laterality: N/A;   COLONOSCOPY WITH PROPOFOL N/A 12/20/2020   Procedure: COLONOSCOPY WITH PROPOFOL;  Surgeon: Earline Mayotte, MD;  Location: [facility] ENDOSCOPY;  Service: Endoscopy;  Laterality: N/A;  IDDM   CORONARY STENT INTERVENTION N/A 04/20/2019   Procedure: CORONARY STENT INTERVENTION;  Surgeon: Marcina Millard, MD;  Location: [facility] INVASIVE CV LAB;  Service:  Cardiovascular;  Laterality: N/A;   ESOPHAGOGASTRODUODENOSCOPY (EGD) WITH PROPOFOL N/A 08/07/2015   Procedure: ESOPHAGOGASTRODUODENOSCOPY (EGD) WITH PROPOFOL;  Surgeon: Christena Deem, MD;  Location: [facility] ENDOSCOPY;  Service: Endoscopy;  Laterality: N/A;   ESOPHAGOGASTRODUODENOSCOPY (EGD) WITH PROPOFOL N/A 09/16/2017   Procedure: ESOPHAGOGASTRODUODENOSCOPY (EGD) WITH PROPOFOL;  Surgeon: Christena Deem, MD;  Location: [facility] ENDOSCOPY;  Service: Endoscopy;  Laterality: N/A;   ESOPHAGOGASTRODUODENOSCOPY (EGD) WITH PROPOFOL N/A 12/20/2020   Procedure: ESOPHAGOGASTRODUODENOSCOPY (EGD) WITH PROPOFOL;  Surgeon: Earline Mayotte, MD;  Location: [facility] ENDOSCOPY;  Service: Endoscopy;  Laterality: N/A;   LEFT HEART CATH AND CORONARY ANGIOGRAPHY Left 04/20/2019   Procedure: LEFT HEART CATH AND CORONARY ANGIOGRAPHY;  Surgeon: Marcina Millard, MD;  Location: [facility] INVASIVE CV LAB;  Service: Cardiovascular;  Laterality: Left;    Family History  Problem Relation Age of Onset   Heart disease Mother        CABG   Lung cancer Mother        Disseminated   Hypertension Mother    Hyperlipidemia Mother    Diabetes Father        mother   Colon cancer Father 49   Cancer Maternal Aunt        x3 aunts, unk types of cancer   Colon cancer Maternal Uncle        x3 uncles   Prostate cancer Maternal Uncle        x4 uncles   Cancer Paternal Uncle  prostate vs colon   Alzheimer's disease Other    Kidney disease Neg Hx     Social History:  reports that he has never smoked. He has never used smokeless tobacco. He reports current alcohol use. He reports that he does not use drugs.  Allergies:  Allergies  Allergen Reactions   Ibuprofen Shortness Of Breath   Nsaids Swelling    Swelling of throat    Medications: Scheduled:  aspirin  81 mg Oral Daily   atorvastatin  80 mg Oral q1800   busPIRone  7.5 mg Oral BID   insulin aspart  0-20 Units Subcutaneous Q4H   isosorbide mononitrate  30 mg Oral  Daily   lisinopril  20 mg Oral Daily   metoprolol succinate  50 mg Oral Daily   omega-3 acid ethyl esters  1 g Oral BID   pantoprazole  40 mg Oral BID    Results for orders placed or performed during the hospital encounter of 05/07/23 (from the past 48 hours)  CBC with Differential     Status: Abnormal   Collection Time: 05/07/23  7:21 PM  Result Value Ref Range   WBC 15.0 (H) 4.0 - 10.5 K/uL   RBC 4.17 (L) 4.22 - 5.81 MIL/uL   Hemoglobin 12.1 (L) 13.0 - 17.0 g/dL   HCT 40.9 (L) 81.1 - 91.4 %   MCV 87.1 80.0 - 100.0 fL   MCH 29.0 26.0 - 34.0 pg   MCHC 33.3 30.0 - 36.0 g/dL   RDW 78.2 95.6 - 21.3 %   Platelets 491 (H) 150 - 400 K/uL   nRBC 0.0 0.0 - 0.2 %   Neutrophils Relative % 82 %   Neutro Abs 12.3 (H) 1.7 - 7.7 K/uL   Lymphocytes Relative 12 %   Lymphs Abs 1.8 0.7 - 4.0 K/uL   Monocytes Relative 4 %   Monocytes Absolute 0.7 0.1 - 1.0 K/uL   Eosinophils Relative 1 %   Eosinophils Absolute 0.1 0.0 - 0.5 K/uL   Basophils Relative 0 %   Basophils Absolute 0.0 0.0 - 0.1 K/uL   Immature Granulocytes 1 %   Abs Immature Granulocytes 0.09 (H) 0.00 - 0.07 K/uL    Comment: Performed at [facility], [address] [place], [place] [address]  Comprehensive metabolic panel     Status: Abnormal   Collection Time: 05/07/23  7:21 PM  Result Value Ref Range   Sodium 130 (L) 135 - 145 mmol/L   Potassium 4.1 3.5 - 5.1 mmol/L   Chloride 94 (L) 98 - 111 mmol/L   CO2 25 22 - 32 mmol/L   Glucose, Bld 396 (H) 70 - 99 mg/dL    Comment: Glucose reference range applies only to samples taken after fasting for at least 8 hours.   BUN 12 6 - 20 mg/dL   Creatinine, Ser 8.46 0.61 - 1.24 mg/dL   Calcium 8.4 (L) 8.9 - 10.3 mg/dL   Total Protein 8.3 (H) 6.5 - 8.1 g/dL   Albumin 2.6 (L) 3.5 - 5.0 g/dL   AST 36 15 - 41 U/L   ALT 41 0 - 44 U/L   Alkaline Phosphatase 97 38 - 126 U/L   Total Bilirubin 0.9 <1.2 mg/dL   GFR, Estimated >96 >29 mL/min    Comment: (NOTE) Calculated using the  CKD-EPI Creatinine Equation (2021)    Anion gap 11 5 - 15    Comment: Performed at [facility], [address] [place], [place] [address]  Lactic acid, plasma     Status: Abnormal   Collection Time: 05/07/23  7:21 PM  Result Value Ref Range   Lactic Acid, Venous 3.2 (HH) 0.5 - 1.9 mmol/L    Comment: CRITICAL RESULT CALLED TO, READ BACK BY AND VERIFIED WITH Tacy Dura @2005  on 05/07/23 skl Performed at [facility], [address] [place], [place] [address]   Blood culture (single)     Status: None (Preliminary result)   Collection Time: 05/07/23  7:21 PM   Specimen: BLOOD  Result Value Ref Range   Specimen Description BLOOD LEFT FOREARM    Special Requests      BOTTLES DRAWN AEROBIC AND ANAEROBIC Blood Culture adequate volume   Culture      NO GROWTH < 12 HOURS Performed at [facility], [address] [place], [place] [address]    Report Status PENDING   Hemoglobin A1c     Status: Abnormal   Collection Time: 05/07/23  7:21 PM  Result Value Ref Range   Hgb A1c MFr Bld 11.2 (H) 4.8 - 5.6 %    Comment: (NOTE) Pre diabetes:          5.7%-6.4%  Diabetes:              >6.4%  Glycemic control for   <7.0% adults with diabetes    Mean Plasma Glucose 274.74 mg/dL    Comment: Performed at [facility], [address] [place], [place] [address]  Lactic acid, plasma     Status: None   Collection Time: 05/07/23  9:42 PM  Result Value Ref Range   Lactic Acid, Venous 1.5 0.5 - 1.9 mmol/L    Comment: Performed at [facility], [address] [place], [place] [address]  Glucose, capillary     Status: Abnormal   Collection Time: 05/08/23 12:57 AM  Result Value Ref Range   Glucose-Capillary 312 (H) 70 - 99 mg/dL    Comment: Glucose reference range applies only to samples taken after fasting for at least 8 hours.  Glucose, capillary     Status: Abnormal   Collection Time: 05/08/23  4:24 AM  Result Value Ref Range   Glucose-Capillary  226 (H) 70 - 99 mg/dL    Comment: Glucose reference range applies only to samples taken after fasting for at least 8 hours.  Basic metabolic panel     Status: Abnormal   Collection Time: 05/08/23  4:31 AM  Result Value Ref Range   Sodium 133 (L) 135 - 145 mmol/L   Potassium 3.5 3.5 - 5.1 mmol/L   Chloride 100 98 - 111 mmol/L   CO2 22 22 - 32 mmol/L   Glucose, Bld 205 (H) 70 - 99 mg/dL    Comment: Glucose reference range applies only to samples taken after fasting for at least 8 hours.   BUN 10 6 - 20 mg/dL   Creatinine, Ser 6.57 0.61 - 1.24 mg/dL   Calcium 7.9 (L) 8.9 - 10.3 mg/dL   GFR, Estimated >84 >69 mL/min    Comment: (NOTE) Calculated using the CKD-EPI Creatinine Equation (2021)    Anion gap 11 5 - 15    Comment: Performed at [facility], [address] [place], [place] [address]  CBC     Status: Abnormal   Collection Time: 05/08/23  4:31 AM  Result Value Ref Range   WBC 12.8 (H) 4.0 - 10.5 K/uL   RBC 3.59 (L) 4.22 - 5.81 MIL/uL  Hemoglobin 10.4 (L) 13.0 - 17.0 g/dL   HCT 62.9 (L) 52.8 - 41.3 %   MCV 83.3 80.0 - 100.0 fL   MCH 29.0 26.0 - 34.0 pg   MCHC 34.8 30.0 - 36.0 g/dL   RDW 24.4 01.0 - 27.2 %   Platelets 407 (H) 150 - 400 K/uL   nRBC 0.0 0.0 - 0.2 %    Comment: Performed at [facility], [address] [place], [place] [address]  Glucose, capillary     Status: Abnormal   Collection Time: 05/08/23  8:09 AM  Result Value Ref Range   Glucose-Capillary 122 (H) 70 - 99 mg/dL    Comment: Glucose reference range applies only to samples taken after fasting for at least 8 hours.    DG Foot Complete Right Result Date: 05/07/2023 CLINICAL DATA:  Foot infection second toe is black EXAM: RIGHT FOOT COMPLETE - 3+ VIEW COMPARISON:  Report 04/06/2020 FINDINGS: No fracture or malalignment. Wound or ulcer plantar aspect of the distal foot. Gas within the soft tissues diffusely surrounding the second digit from the level of the distal metatarsal to the  distal phalanx. Additional gas present within the soft tissues of the medial first digit, between the second and third digits, and between the fourth and fifth digits. No obvious osseous destructive change. IMPRESSION: Wound or ulcer plantar aspect of the distal foot with gas within the soft tissues diffusely surrounding the second digit and additional gas present within the soft tissues of the medial first digit, between the second and third digits, and between the fourth and fifth digits. Findings are suspicious for necrotic soft tissue infection. No definite acute osseous abnormality. Electronically Signed   By: Jasmine Pang M.D.   On: 05/07/2023 21:06    Review of Systems  Constitutional:  Negative for chills and fever.  HENT:  Negative for sinus pain and sore throat.   Respiratory:  Negative for choking and shortness of breath.   Cardiovascular:  Negative for chest pain and palpitations.  Gastrointestinal:  Positive for nausea and vomiting.  Genitourinary:  Negative for frequency and urgency.  Musculoskeletal:  Positive for back pain. Negative for myalgias.  Skin:        Relates black discoloration on his right foot at the second toe.  States this just recently started turning black a few days ago and got really bad yesterday.  Has had some drainage.  Has had problems with a chronic ulceration on the foot.  Has had some redness and swelling in the right foot but states the redness seems to be improving now that he is on antibiotics.  Neurological:        Patient relates significant neuropathy associated with his diabetes.  Psychiatric/Behavioral:  Negative for confusion. The patient is not nervous/anxious.    Blood pressure (!) 156/94, pulse 88, temperature 99.4 F (37.4 C), resp. rate 16, height 5\' 10"  (1.778 m), weight 106.6 kg, SpO2 99%. Physical Exam Cardiovascular:     Comments: Could not palpate a DP pulse on the right.  PT pulse was diminished. Musculoskeletal:     Comments:  Guarded range of motion in the right foot.  Some digital contractures are noted.  Muscle testing deferred.  Skin:    Comments: Gangrenous changes noted to the right second and third toes with some significant surrounding erythema.  Significant malodor.  Erythema extends up onto the dorsal midfoot.  Neurological:     Comments: Loss of protective threshold in the feet bilateral.  Assessment/Plan: Assessment: 1.  Gas gangrene right foot. 2.  Diabetes with associated neuropathy. 3.  Peripheral vascular disease.  Plan: Discussed with the patient the need for transmetatarsal amputation to try to remove all of the infection in his foot.  Discussed that we would also need to have vascular surgery perform angiogram.  Spoke with Dr. Gilda Crease this morning.  Due to the gas and extent of the infection he recommended proceeding with transmetatarsal amputation and plan for angio most likely tomorrow.  Discussed with the patient possible risks and complications of the procedure including but not limited to inability of the wound to heal due to his diabetes, vascular status, or continued infection.  Discussed that he is at significant risk for the need for higher amputation to eliminate the infection.  No guarantees could be given as to the outcome of surgery.  Questions invited and answered.  Obtain consent for transmetatarsal amputation right foot.  Patient is currently NPO.  Plan for surgery around noon.  [name] 05/08/2023, 8:48 AM

## 2023-05-09 ENCOUNTER — Encounter: Payer: Self-pay | Admitting: Podiatry

## 2023-05-09 ENCOUNTER — Encounter
Admission: EM | Disposition: A | Discharge status: Skilled Nursing Facility | Payer: Self-pay | Source: Home / Self Care | Attending: Osteopathic Medicine

## 2023-05-09 DIAGNOSIS — I70235 Atherosclerosis of native arteries of right leg with ulceration of other part of foot: Secondary | ICD-10-CM | POA: Diagnosis not present

## 2023-05-09 DIAGNOSIS — L97519 Non-pressure chronic ulcer of other part of right foot with unspecified severity: Secondary | ICD-10-CM

## 2023-05-09 DIAGNOSIS — I700 Atherosclerosis of aorta: Secondary | ICD-10-CM

## 2023-05-09 DIAGNOSIS — L03115 Cellulitis of right lower limb: Secondary | ICD-10-CM | POA: Diagnosis not present

## 2023-05-09 DIAGNOSIS — E11628 Type 2 diabetes mellitus with other skin complications: Secondary | ICD-10-CM | POA: Diagnosis not present

## 2023-05-09 DIAGNOSIS — L089 Local infection of the skin and subcutaneous tissue, unspecified: Secondary | ICD-10-CM | POA: Diagnosis not present

## 2023-05-09 HISTORY — PX: LOWER EXTREMITY ANGIOGRAPHY: CATH118251

## 2023-05-09 LAB — BASIC METABOLIC PANEL
Anion gap: 9 (ref 5–15)
BUN: 17 mg/dL (ref 6–20)
CO2: 23 mmol/L (ref 22–32)
Calcium: 7.9 mg/dL — ABNORMAL LOW (ref 8.9–10.3)
Chloride: 100 mmol/L (ref 98–111)
Creatinine, Ser: 0.87 mg/dL (ref 0.61–1.24)
GFR, Estimated: >60 mL/min (ref >60)
Glucose, Bld: 265 mg/dL — ABNORMAL HIGH (ref 70–99)
Potassium: 3.7 mmol/L (ref 3.5–5.1)
Sodium: 132 mmol/L — ABNORMAL LOW (ref 135–145)

## 2023-05-09 LAB — CBC
HCT: 30.4 % — ABNORMAL LOW (ref 39.0–52.0)
Hemoglobin: 10.6 g/dL — ABNORMAL LOW (ref 13.0–17.0)
MCH: 29 pg (ref 26.0–34.0)
MCHC: 34.9 g/dL (ref 30.0–36.0)
MCV: 83.1 fL (ref 80.0–100.0)
Platelets: 414 10*3/uL — ABNORMAL HIGH (ref 150–400)
RBC: 3.66 MIL/uL — ABNORMAL LOW (ref 4.22–5.81)
RDW: 12.6 % (ref 11.5–15.5)
WBC: 14.5 10*3/uL — ABNORMAL HIGH (ref 4.0–10.5)
nRBC: 0 % (ref 0.0–0.2)

## 2023-05-09 LAB — GLUCOSE, CAPILLARY
Glucose-Capillary: 190 mg/dL — ABNORMAL HIGH (ref 70–99)
Glucose-Capillary: 197 mg/dL — ABNORMAL HIGH (ref 70–99)
Glucose-Capillary: 221 mg/dL — ABNORMAL HIGH (ref 70–99)
Glucose-Capillary: 226 mg/dL — ABNORMAL HIGH (ref 70–99)
Glucose-Capillary: 227 mg/dL — ABNORMAL HIGH (ref 70–99)
Glucose-Capillary: 260 mg/dL — ABNORMAL HIGH (ref 70–99)
Glucose-Capillary: 294 mg/dL — ABNORMAL HIGH (ref 70–99)
Glucose-Capillary: 301 mg/dL — ABNORMAL HIGH (ref 70–99)

## 2023-05-09 LAB — LACTIC ACID, PLASMA: Lactic Acid, Venous: 0.9 mmol/L (ref 0.5–1.9)

## 2023-05-09 SURGERY — LOWER EXTREMITY ANGIOGRAPHY
Anesthesia: Moderate Sedation | Laterality: Right

## 2023-05-09 MED ORDER — HYDROCODONE-ACETAMINOPHEN 5-325 MG PO TABS
ORAL_TABLET | ORAL | Status: AC
  Filled 2023-05-09: qty 2

## 2023-05-09 MED ORDER — HEPARIN SODIUM (PORCINE) 1000 UNIT/ML IJ SOLN
INTRAMUSCULAR | Status: AC
  Filled 2023-05-09: qty 10

## 2023-05-09 MED ORDER — FENTANYL CITRATE PF 50 MCG/ML IJ SOSY: 12.5000 ug | PREFILLED_SYRINGE | Freq: Once | INTRAMUSCULAR | Status: DC | PRN

## 2023-05-09 MED ORDER — DIPHENHYDRAMINE HCL 50 MG/ML IJ SOLN: 50.0000 mg | Freq: Once | INTRAMUSCULAR | Status: DC | PRN

## 2023-05-09 MED ORDER — MIDAZOLAM HCL 2 MG/2ML IJ SOLN
INTRAMUSCULAR | Status: DC | PRN
  Administered 2023-05-09: 1 mg via INTRAVENOUS
  Administered 2023-05-09: 2 mg via INTRAVENOUS

## 2023-05-09 MED ORDER — NITROGLYCERIN 1 MG/10 ML FOR IR/CATH LAB
INTRA_ARTERIAL | Status: DC | PRN
  Administered 2023-05-09 (×2): 300 ug via INTRA_ARTERIAL

## 2023-05-09 MED ORDER — FENTANYL CITRATE (PF) 100 MCG/2ML IJ SOLN
INTRAMUSCULAR | Status: AC
  Filled 2023-05-09: qty 2

## 2023-05-09 MED ORDER — FENTANYL CITRATE (PF) 100 MCG/2ML IJ SOLN
INTRAMUSCULAR | Status: DC | PRN
  Administered 2023-05-09: 25 ug via INTRAVENOUS
  Administered 2023-05-09: 50 ug via INTRAVENOUS

## 2023-05-09 MED ORDER — SODIUM CHLORIDE 0.9 % IV SOLN: INTRAVENOUS | Status: DC

## 2023-05-09 MED ORDER — SODIUM CHLORIDE 0.9% FLUSH
3.0000 mL | Freq: Two times a day (BID) | INTRAVENOUS | Status: DC
  Administered 2023-05-09 – 2023-05-26 (×32): 3 mL via INTRAVENOUS

## 2023-05-09 MED ORDER — SODIUM CHLORIDE 0.9 % IV SOLN: 250.0000 mL | INTRAVENOUS | Status: DC | PRN

## 2023-05-09 MED ORDER — IODIXANOL 320 MG/ML IV SOLN
INTRAVENOUS | Status: DC | PRN
  Administered 2023-05-09: 55 mL via INTRA_ARTERIAL

## 2023-05-09 MED ORDER — HEPARIN SODIUM (PORCINE) 1000 UNIT/ML IJ SOLN
INTRAMUSCULAR | Status: DC | PRN
  Administered 2023-05-09: 8000 [IU] via INTRAVENOUS

## 2023-05-09 MED ORDER — PHENOL 1.4 % MT LIQD
1.0000 | OROMUCOSAL | Status: DC | PRN
  Administered 2023-05-09 (×2): 1 via OROMUCOSAL
  Filled 2023-05-09: qty 177

## 2023-05-09 MED ORDER — FAMOTIDINE 20 MG PO TABS: 40.0000 mg | ORAL_TABLET | Freq: Once | ORAL | Status: DC | PRN

## 2023-05-09 MED ORDER — ENOXAPARIN SODIUM 40 MG/0.4ML IJ SOSY: 40.0000 mg | PREFILLED_SYRINGE | INTRAMUSCULAR | Status: DC

## 2023-05-09 MED ORDER — MIDAZOLAM HCL 2 MG/ML PO SYRP
8.0000 mg | ORAL_SOLUTION | Freq: Once | ORAL | Status: DC | PRN
  Filled 2023-05-09: qty 5

## 2023-05-09 MED ORDER — MIDAZOLAM HCL 5 MG/5ML IJ SOLN
INTRAMUSCULAR | Status: AC
  Filled 2023-05-09: qty 5

## 2023-05-09 MED ORDER — LIDOCAINE HCL (PF) 1 % IJ SOLN
INTRAMUSCULAR | Status: DC | PRN
  Administered 2023-05-09: 10 mL

## 2023-05-09 MED ORDER — ENOXAPARIN SODIUM 60 MG/0.6ML IJ SOSY
0.5000 mg/kg | PREFILLED_SYRINGE | Freq: Every day | INTRAMUSCULAR | Status: DC
  Administered 2023-05-10 – 2023-05-16 (×7): 52.5 mg via SUBCUTANEOUS
  Filled 2023-05-09 (×7): qty 0.6

## 2023-05-09 MED ORDER — CLOPIDOGREL BISULFATE 75 MG PO TABS: 75.0000 mg | ORAL_TABLET | Freq: Every day | ORAL | Status: DC

## 2023-05-09 MED ORDER — INSULIN ASPART 100 UNIT/ML IJ SOLN
3.0000 [IU] | Freq: Three times a day (TID) | INTRAMUSCULAR | Status: DC
  Administered 2023-05-09 – 2023-05-26 (×44): 3 [IU] via SUBCUTANEOUS
  Filled 2023-05-09 (×45): qty 1

## 2023-05-09 MED ORDER — HEPARIN (PORCINE) IN NACL 2000-0.9 UNIT/L-% IV SOLN
INTRAVENOUS | Status: DC | PRN
  Administered 2023-05-09: 1000 mL

## 2023-05-09 MED ORDER — METHYLPREDNISOLONE SODIUM SUCC 125 MG IJ SOLR: 125.0000 mg | Freq: Once | INTRAMUSCULAR | Status: DC | PRN

## 2023-05-09 MED ORDER — NITROGLYCERIN 1 MG/10 ML FOR IR/CATH LAB
INTRA_ARTERIAL | Status: AC
Start: 2023-05-09 — End: ?
  Filled 2023-05-09: qty 10

## 2023-05-09 MED ORDER — SODIUM CHLORIDE 0.9% FLUSH
3.0000 mL | INTRAVENOUS | Status: DC | PRN
Start: 2023-05-09 — End: 2023-05-26

## 2023-05-09 MED ORDER — INSULIN GLARGINE-YFGN 100 UNIT/ML ~~LOC~~ SOLN
35.0000 [IU] | Freq: Every day | SUBCUTANEOUS | Status: DC
  Administered 2023-05-09 – 2023-05-10 (×2): 35 [IU] via SUBCUTANEOUS
  Filled 2023-05-09 (×3): qty 0.35

## 2023-05-09 SURGICAL SUPPLY — 31 items
BALLN LUTONIX DCB 4X100X130 (BALLOONS) ×1
BALLN LUTONIX DCB 4X60X130 (BALLOONS) ×1
BALLN LUTONIX DCB 5X60X130 (BALLOONS) ×1
BALLN LUTONIX DCB 6X80X130 (BALLOONS) ×1
BALLN ULTRVRSE 3X150X130 (BALLOONS) ×1
BALLOON LUTONIX DCB 4X100X130 (BALLOONS) IMPLANT
BALLOON LUTONIX DCB 4X60X130 (BALLOONS) IMPLANT
BALLOON LUTONIX DCB 5X60X130 (BALLOONS) IMPLANT
BALLOON LUTONIX DCB 6X80X130 (BALLOONS) IMPLANT
BALLOON ULTRVRSE 3X150X130 (BALLOONS) IMPLANT
CATH ANGIO 5F PIGTAIL 65CM (CATHETERS) IMPLANT
CATH BEACON 5 .038 100 VERT TP (CATHETERS) IMPLANT
COVER PROBE ULTRASOUND 5X96 (MISCELLANEOUS) IMPLANT
DEVICE PRESTO INFLATION (MISCELLANEOUS) IMPLANT
DEVICE STARCLOSE SE CLOSURE (Vascular Products) IMPLANT
GLIDEWIRE ADV .035X260CM (WIRE) IMPLANT
GOWN STRL REUS W/ TWL LRG LVL3 (GOWN DISPOSABLE) ×1 IMPLANT
NDL ENTRY 21GA 7CM ECHOTIP (NEEDLE) IMPLANT
NEEDLE ENTRY 21GA 7CM ECHOTIP (NEEDLE) ×1 IMPLANT
PACK ANGIOGRAPHY (CUSTOM PROCEDURE TRAY) ×1 IMPLANT
SET INTRO CAPELLA COAXIAL (SET/KITS/TRAYS/PACK) IMPLANT
SHEATH BRITE TIP 5FRX11 (SHEATH) IMPLANT
SHEATH RAABE 6FR (SHEATH) IMPLANT
STENT LIFESTENT 5F 5X60X135 (Permanent Stent) IMPLANT
STENT LIFESTENT 5F 6X60X135 (Permanent Stent) IMPLANT
STENT LIFESTENT 5F 7X80X135 (Permanent Stent) IMPLANT
SYR MEDRAD MARK 7 150ML (SYRINGE) IMPLANT
TUBING CONTRAST HIGH PRESS 72 (TUBING) IMPLANT
WIRE G V18X300CM (WIRE) IMPLANT
WIRE GUIDERIGHT .035X150 (WIRE) IMPLANT
WIRE SUPRACORE 300CM (WIRE) IMPLANT

## 2023-05-09 NOTE — Progress Notes (Signed)
Pharmacy Antibiotic Note  Gerald Ellis is a 57 y.o. male admitted on 05/07/2023 with  diabetic wound infection .  Pharmacy has been consulted for Vancomycin and Cefepime dosing.  Day 3 of antibiotics. Patient is s/p transmetatarsal amputation of right foot. Per podiatry, there could be potential for the patient needing a below-knee amputation, to consult vascular surgery, as well. Plan to continue empiric treatment until cultures finalize.  Plan: Continue Cefepime 2 gm IV Q8H  Continue Vancomycin 1 gm IV Q12H Est AUC 438 Scr used 0.87 Vd used: 0.5 estCmin 11.4 Pharmacy will continue to follow renal function, cultures and clinical progress to adjust therapy as needed.    Height: 5\' 10"  (177.8 cm) Weight: 106.6 kg (235 lb) IBW/kg (Calculated) : 73  Temp (24hrs), Avg:97.7 F (36.5 C), Min:97.4 F (36.3 C), Max:98 F (36.7 C)  Recent Labs  Lab 05/07/23 1921 05/07/23 2142 05/08/23 0431 05/09/23 0428  WBC 15.0*  --  12.8* 14.5*  CREATININE 0.98  --  0.91 0.87  LATICACIDVEN 3.2* 1.5  --  0.9    Estimated Creatinine Clearance: 115.9 mL/min (by C-G formula based on SCr of 0.87 mg/dL).    Allergies  Allergen Reactions   Ibuprofen Shortness Of Breath   Nsaids Swelling    Swelling of throat    Antimicrobials this admission: Zosyn 12/18 x 1 doses Cefepime 12/19>> Metronidazole 12/19>> Vancomycin 12/18  Dose adjustments this admission: n/a   Microbiology results: 12/18 BCx: NGTD 12/19 right foot tissue cx: few GPC  Thank you for allowing pharmacy to be a part of this patient's care.  Bettey Costa, PharmD Clinical Pharmacist 05/09/2023 1:58 PM

## 2023-05-09 NOTE — Inpatient Diabetes Management (Addendum)
Inpatient Diabetes Program Recommendations  AACE/ADA: New Consensus Statement on Inpatient Glycemic Control  Target Ranges:  Prepandial:   less than 140 mg/dL      Peak postprandial:   less than 180 mg/dL (1-2 hours)      Critically ill patients:  140 - 180 mg/dL     Review of Glycemic Control  Latest Reference Range & Units 05/08/23 08:09 05/08/23 10:40 05/08/23 13:06 05/08/23 15:44 05/08/23 16:37 05/08/23 21:01 05/09/23 00:02 05/09/23 04:40 05/09/23 08:31  Glucose-Capillary 70 - 99 mg/dL 161 (H) 096 (H) 045 (H) 190 (H) 261 (H) 287 (H) 294 (H) 260 (H) 226 (H)   Diabetes history: DM2 Outpatient Diabetes medications: Humalog 70 units TID with meals, Tresiba 142 units at bedtime, Metformin 1000 mg BID, Actos 15 mg BID Current orders for Inpatient glycemic control: Semglee 30 units daily at bedtime, Novolog 0-20 units Q4H  Decadron 5 mg given lunchtime on 12/19  -   Add Novolog 3 units tid meal coverage if eating >50% of meals  Glucose trends should start to trend downward.  Spoke with pt at bedside regarding A1c of 11.2% on 12/18 and glucose control at home. Pt reports his glucose trends have been in the 200's at home. Pt has used FSL3 however the pharmacy did not have it last time and he had to utilize another pharmacy. He has not had the FSL3 in a couple of weeks but has a fingerstick meter to use in the meantime. Pt reports his A1c was much lower at his last PCP appointment. Pt also stated he has been sick for 3 weeks and has lost 15 pounds. Pt reports eating a good diet at home, but when he does eat his glucose increases significantly. Pt reports taking his insulin significantly. Discussed pt received decadron during surgery. Discussed the reduced amount of insulin being used in the hospital compared to home doses. Encouraged follow up with Dr. Tedd Sias, Endocrinologist.  Will follow glucose trends while here.  Thanks, Christena Deem RN, MSN, BC-ADM Inpatient Diabetes Coordinator Team  Pager [phone] (8a-5p)

## 2023-05-09 NOTE — Plan of Care (Incomplete)
  Problem: Coping: Goal: Ability to adjust to condition or change in health will improve Outcome: Progressing   Problem: Fluid Volume: Goal: Ability to maintain a balanced intake and output will improve Outcome: Progressing   Problem: Nutritional: Goal: Maintenance of adequate nutrition will improve Outcome: Progressing Goal: Progress toward achieving an optimal weight will improve Outcome: Progressing   Problem: Skin Integrity: Goal: Risk for impaired skin integrity will decrease Outcome: Progressing   Problem: Clinical Measurements: Goal: Diagnostic test results will improve Outcome: Progressing Goal: Signs and symptoms of infection will decrease Outcome: Progressing   Problem: Education: Goal: Knowledge of General Education information will improve Description: Including pain rating scale, medication(s)/side effects and non-pharmacologic comfort measures Outcome: Progressing   Problem: Clinical Measurements: Goal: Ability to maintain clinical measurements within normal limits will improve Outcome: Progressing Goal: Will remain free from infection Outcome: Progressing Goal: Respiratory complications will improve Outcome: Progressing   Problem: Activity: Goal: Risk for activity intolerance will decrease Outcome: Progressing

## 2023-05-09 NOTE — Plan of Care (Signed)
  Problem: Education: Goal: Knowledge of General Education information will improve Description: Including pain rating scale, medication(s)/side effects and non-pharmacologic comfort measures Outcome: Progressing   Problem: Health Behavior/Discharge Planning: Goal: Ability to manage health-related needs will improve Outcome: Progressing   Problem: Clinical Measurements: Goal: Ability to maintain clinical measurements within normal limits will improve Outcome: Progressing Goal: Will remain free from infection Outcome: Progressing Goal: Diagnostic test results will improve Outcome: Progressing Goal: Respiratory complications will improve Outcome: Progressing Goal: Cardiovascular complication will be avoided Outcome: Progressing   Problem: Activity: Goal: Risk for activity intolerance will decrease Outcome: Progressing   Problem: Nutrition: Goal: Adequate nutrition will be maintained Outcome: Progressing   Problem: Coping: Goal: Level of anxiety will decrease Outcome: Progressing   Problem: Elimination: Goal: Will not experience complications related to bowel motility Outcome: Progressing Goal: Will not experience complications related to urinary retention Outcome: Progressing   Problem: Pain Management: Goal: General experience of comfort will improve Outcome: Progressing   Problem: Safety: Goal: Ability to remain free from injury will improve Outcome: Progressing   Problem: Skin Integrity: Goal: Risk for impaired skin integrity will decrease Outcome: Progressing   Problem: Education: Goal: Understanding of CV disease, CV risk reduction, and recovery process will improve Outcome: Progressing   Problem: Activity: Goal: Ability to return to baseline activity level will improve Outcome: Progressing   Problem: Cardiovascular: Goal: Ability to achieve and maintain adequate cardiovascular perfusion will improve Outcome: Progressing Goal: Vascular access site(s)  Level 0-1 will be maintained Outcome: Progressing   Problem: Health Behavior/Discharge Planning: Goal: Ability to safely manage health-related needs after discharge will improve Outcome: Progressing

## 2023-05-09 NOTE — Evaluation (Signed)
Occupational Therapy Evaluation Patient Details Name: Gerald Ellis MRN: [id number] DOB: Sep 11, 1965 Today's Date: 05/09/2023   History of Present Illness 57 y.o. male with medical history significant for HTN, HLD, CAD s/p stent, DM with neuropathy, chronic back pain on opiates,, anxiety, chronic ulcer first right MTP, followed by podiatry (last seen 02/18/2023), a with normal resting ABI at the time and treated with topical dressings who presents to the ED with a 1 week history of blistering in the area of the wound and then over the past day he noted that his second and third toes of the right foot were turning black. 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   Clinical Impression   Pt was seen for OT evaluation this date. Prior to hospital admission, pt was independent and eager to return to independence. Sister present and reports pt is going to stay with her initially for increased support. Pt presents to acute OT demonstrating impaired ADL performance and functional mobility 2/2 decreased balance, NWBing RLE (See OT problem list for additional functional deficits). Pt currently requires CGA for ADL transfers and mobility with RW and requiring VC for NWBing as he has tendency to place his heel on the ground for improved stability. Pt educated in RW use and precautions for toileting and mobility. Pt would benefit from skilled OT services to address noted impairments and functional limitations (see below for any additional details) in order to maximize safety and independence while minimizing falls risk and caregiver burden.     If plan is discharge home, recommend the following: A little help with walking and/or transfers;A little help with bathing/dressing/bathroom;Assistance with cooking/housework;Assist for transportation;Help with stairs or ramp for entrance    Functional Status Assessment  Patient has had a recent decline in their functional status and demonstrates the ability to  make significant improvements in function in a reasonable and predictable amount of time.  Equipment Recommendations  Other (comment) (2WW)    Recommendations for Other Services       Precautions / Restrictions Precautions Precautions: Fall Restrictions Weight Bearing Restrictions Per Provider Order: Yes RLE Weight Bearing Per Provider Order: Non weight bearing      Mobility Bed Mobility Overal bed mobility: Modified Independent                  Transfers Overall transfer level: Needs assistance Equipment used: Rolling walker (2 wheels) Transfers: Sit to/from Stand Sit to Stand: Supervision                  Balance Overall balance assessment: Mild deficits observed, not formally tested                                         ADL either performed or assessed with clinical judgement   ADL Overall ADL's : Needs assistance/impaired                         Toilet Transfer: Contact guard assist;BSC/3in1;Rolling walker (2 wheels);Regular Teacher, adult education Details (indicate cue type and reason): BSC over toilet, use of rails to help lower himself slowly Toileting- Clothing Manipulation and Hygiene: Sitting/lateral lean;Supervision/safety Toileting - Clothing Manipulation Details (indicate cue type and reason): remote supervision     Functional mobility during ADLs: Contact guard assist;Cueing for safety;Cueing for sequencing;Rolling walker (2 wheels) General ADL Comments: VC for NWBing as he has tendency to  rest R heel on the floor     Vision         Perception         Praxis         Pertinent Vitals/Pain Pain Assessment Pain Assessment: 0-10 Pain Score: 2  Pain Location: R foot Pain Descriptors / Indicators: Aching Pain Intervention(s): Monitored during session, Premedicated before session, Repositioned     Extremity/Trunk Assessment Upper Extremity Assessment Upper Extremity Assessment: Overall WFL for tasks  assessed   Lower Extremity Assessment Lower Extremity Assessment: RLE deficits/detail RLE Deficits / Details: s/p transmet amp       Communication Communication Communication: No apparent difficulties   Cognition Arousal: Alert Behavior During Therapy: WFL for tasks assessed/performed Overall Cognitive Status: Within Functional Limits for tasks assessed                                       General Comments       Exercises Other Exercises Other Exercises: Pt/sister educated in AE/DME forLB ADL including BSC, falls prevention   Shoulder Instructions      Home Living Family/patient expects to be discharged to:: Private residence Living Arrangements: Alone Available Help at Discharge: Family;Available 24 hours/day Type of Home: House Home Access: Ramped entrance     Home Layout: One level     Bathroom Shower/Tub: Tub/shower unit;Walk-in shower (walk in tub with built in bench)   Bathroom Toilet: Handicapped height     Home Equipment: Toilet riser;BSC/3in1   Additional Comments: plans to stay with sister - house info above for sister's home      Prior Functioning/Environment Prior Level of Function : Independent/Modified Independent;Driving             Mobility Comments: no AD ADLs Comments: indep with ADL and IADL including driving        OT Problem List: Decreased strength;Pain;Impaired balance (sitting and/or standing);Decreased knowledge of use of DME or AE;Decreased knowledge of precautions      OT Treatment/Interventions: Self-care/ADL training;Therapeutic exercise;Therapeutic activities;DME and/or AE instruction;Patient/family education;Balance training    OT Goals(Current goals can be found in the care plan section) Acute Rehab OT Goals Patient Stated Goal: get better and go home OT Goal Formulation: With patient/family Time For Goal Achievement: 05/23/23 Potential to Achieve Goals: Good ADL Goals Pt Will Perform Lower Body  Dressing: with modified independence;sitting/lateral leans Pt Will Transfer to Toilet: with modified independence;ambulating (LRAD, maintaining precautions) Pt Will Perform Toileting - Clothing Manipulation and hygiene: with modified independence;sitting/lateral leans Additional ADL Goal #1: Pt will complete ADL/mobility tasks while maintaining WBing precautions throughout and no VC needed to utilize, 3/3 opportunities.  OT Frequency: Min 1X/week    Co-evaluation              AM-PAC OT "6 Clicks" Daily Activity     Outcome Measure Help from another person eating meals?: None Help from another person taking care of personal grooming?: None Help from another person toileting, which includes using toliet, bedpan, or urinal?: A Little Help from another person bathing (including washing, rinsing, drying)?: A Little Help from another person to put on and taking off regular upper body clothing?: None Help from another person to put on and taking off regular lower body clothing?: A Little 6 Click Score: 21   End of Session Equipment Utilized During Treatment: Rolling walker (2 wheels)  Activity Tolerance: Patient tolerated treatment well Patient left:  in bed;with call bell/phone within reach;with family/visitor present;with nursing/sitter in room (with transfer staff for procedure)  OT Visit Diagnosis: Other abnormalities of gait and mobility (R26.89);Pain Pain - Right/Left: Right Pain - part of body: Ankle and joints of foot                Time: 1041-1051 OT Time Calculation (min): 10 min Charges:  OT General Charges $OT Visit: 1 Visit OT Evaluation $OT Eval Low Complexity: 1 Low  Arman Filter., MPH, MS, OTR/L ascom [phone] 05/09/23, 1:23 PM

## 2023-05-09 NOTE — Progress Notes (Signed)
PT Cancellation Note  Patient Details Name: Gerald Ellis MRN: [id number] DOB: 1965-09-23   Cancelled Treatment:    Reason Eval/Treat Not Completed: Patient at procedure or test/unavailable.  Pt currently off floor for procedure.  Will re-attempt PT evaluation at a later date/time as medically appropriate.  Hendricks Limes, PT 05/09/23, 12:16 PM

## 2023-05-09 NOTE — Progress Notes (Signed)
1 Day Post-Op   Subjective/Chief Complaint: Patient seen.  Some pain at the amputation site overnight.  Medications are helping now.   Objective: Vital signs in last 24 hours: Temp:  [97.4 F (36.3 C)-98.9 F (37.2 C)] 97.6 F (36.4 C) (12/20 0830) Pulse Rate:  [76-94] 76 (12/20 0830) Resp:  [13-34] 20 (12/20 0830) BP: (118-155)/(67-87) 133/80 (12/20 0830) SpO2:  [93 %-99 %] 99 % (12/20 0830) Last BM Date : 05/08/23  Intake/Output from previous day: 12/19 0701 - 12/20 0700 In: 1713.6 [P.O.:120; I.V.:900; IV Piggyback:693.6] Out: 1055 [Urine:1050; Blood:5] Intake/Output this shift: Total I/O In: -  Out: 700 [Urine:700]  No significant bleeding noted on the bandaging.  Upon removal the incision is well coapted but there are some early signs of necrosis around the incision, mostly along the plantar flap.  Still some significant erythema.     Lab Results:  Recent Labs    05/08/23 0431 05/09/23 0428  WBC 12.8* 14.5*  HGB 10.4* 10.6*  HCT 29.9* 30.4*  PLT 407* 414*   BMET Recent Labs    05/08/23 0431 05/09/23 0428  NA 133* 132*  K 3.5 3.7  CL 100 100  CO2 22 23  GLUCOSE 205* 265*  BUN 10 17  CREATININE 0.91 0.87  CALCIUM 7.9* 7.9*   PT/INR No results for input(s): "LABPROT", "INR" in the last 72 hours. ABG No results for input(s): "PHART", "HCO3" in the last 72 hours.  Invalid input(s): "PCO2", "PO2"  Studies/Results: MR FOOT RIGHT WO CONTRAST Result Date: 05/08/2023 CLINICAL DATA:  Soft tissue infection suspected. Right foot blistering for a few months with recent onset of discoloration of the 2nd toe. EXAM: MRI OF THE RIGHT FOREFOOT WITHOUT CONTRAST TECHNIQUE: Multiplanar, multisequence MR imaging of the right forefoot was performed. No intravenous contrast was administered. COMPARISON:  Radiographs same date. No other comparison studies available. FINDINGS: Technical note: Despite efforts by the technologist and patient, mild motion artifact is present  on today's exam and could not be eliminated. This reduces exam sensitivity and specificity. Bones/Joint/Cartilage As shown on same-day radiographs, there is attenuation of the soft tissues of the 2nd toe. No underlying cortical destruction or marrow signal abnormality is identified to suggest osteomyelitis. There is no evidence of acute fracture or dislocation. No significant joint effusions are identified. The alignment is normal at the Lisfranc joint. Ligaments Intact Lisfranc ligament. The collateral ligaments of the metatarsophalangeal joints appear intact. Muscles and Tendons Generalized forefoot muscular atrophy. Dorsal soft tissue emphysema with prominent fluid in the extensor tendon sheaths of the 2nd ray. There is a small amount of fluid within the flexor digitorum tendon sheaths. Soft tissues As seen on radiographs, there is extensive soft tissue emphysema within the medial aspect of the forefoot, extending into the 1st and 2nd web spaces. There is associated ill-defined surrounding fluid, greatest dorsally. This process extends proximally to the level of the mid 2nd metatarsal. These findings remain highly concerning for necrotizing soft tissue infection. No well-defined soft tissue abscess. IMPRESSION: 1. Extensive soft tissue emphysema within the medial aspect of the forefoot, extending into the 1st and 2nd web spaces, with associated ill-defined surrounding fluid, greatest dorsally. These findings remain highly concerning for necrotizing soft tissue infection. No focal abscess identified. 2. No evidence of osteomyelitis or septic arthritis. 3. Prominent fluid in the extensor tendon sheaths of the 2nd ray, likely septic tenosynovitis. Electronically Signed   By: Carey Bullocks M.D.   On: 05/08/2023 10:06   DG Foot Complete Right Result  Date: 05/07/2023 CLINICAL DATA:  Foot infection second toe is black EXAM: RIGHT FOOT COMPLETE - 3+ VIEW COMPARISON:  Report 04/06/2020 FINDINGS: No fracture or  malalignment. Wound or ulcer plantar aspect of the distal foot. Gas within the soft tissues diffusely surrounding the second digit from the level of the distal metatarsal to the distal phalanx. Additional gas present within the soft tissues of the medial first digit, between the second and third digits, and between the fourth and fifth digits. No obvious osseous destructive change. IMPRESSION: Wound or ulcer plantar aspect of the distal foot with gas within the soft tissues diffusely surrounding the second digit and additional gas present within the soft tissues of the medial first digit, between the second and third digits, and between the fourth and fifth digits. Findings are suspicious for necrotic soft tissue infection. No definite acute osseous abnormality. Electronically Signed   By: Jasmine Pang M.D.   On: 05/07/2023 21:06    Anti-infectives: Anti-infectives (From admission, onward)    Start     Dose/Rate Route Frequency Ordered Stop   05/08/23 1000  vancomycin (VANCOCIN) IVPB 1000 mg/200 mL premix        1,000 mg 200 mL/hr over 60 Minutes Intravenous Every 12 hours 05/07/23 2251     05/08/23 0400  ceFEPIme (MAXIPIME) 2 g in sodium chloride 0.9 % 100 mL IVPB        2 g 200 mL/hr over 30 Minutes Intravenous Every 8 hours 05/07/23 2252     05/07/23 2230  ceFEPIme (MAXIPIME) 2 g in sodium chloride 0.9 % 100 mL IVPB  Status:  Discontinued        2 g 200 mL/hr over 30 Minutes Intravenous  Once 05/07/23 2222 05/07/23 2251   05/07/23 2230  metroNIDAZOLE (FLAGYL) IVPB 500 mg  Status:  Discontinued        500 mg 100 mL/hr over 60 Minutes Intravenous Every 12 hours 05/07/23 2222 05/08/23 0953   05/07/23 2230  vancomycin (VANCOCIN) IVPB 1000 mg/200 mL premix  Status:  Discontinued        1,000 mg 200 mL/hr over 60 Minutes Intravenous  Once 05/07/23 2222 05/07/23 2224   05/07/23 2230  Vancomycin (VANCOCIN) 1,500 mg in sodium chloride 0.9 % 500 mL IVPB        1,500 mg 250 mL/hr over 120 Minutes  Intravenous  Once 05/07/23 2225 05/08/23 0312   05/07/23 2130  vancomycin (VANCOCIN) IVPB 1000 mg/200 mL premix        1,000 mg 200 mL/hr over 60 Minutes Intravenous  Once 05/07/23 2128 05/08/23 0114   05/07/23 2130  piperacillin-tazobactam (ZOSYN) IVPB 3.375 g        3.375 g 100 mL/hr over 30 Minutes Intravenous  Once 05/07/23 2128 05/07/23 2216       Assessment/Plan: s/p Procedure(s): TRANSMETATARSAL AMPUTATION (Right) Assessment: Status post transmetatarsal amputation right foot.  Plan: Betadine gauze applied to the incision followed by sterile bandage.  Discussed with the patient with the early signs of infection and the appearance of his amputation site that he will end up needing a below-knee amputation.  I will discuss this with Dr. Gilda Crease.  Wound edges already starting to appear nonviable.  May change the bandage on Sunday for reevaluation but from podiatry standpoint nothing much else to do.  LOS: 2 days    [name] 05/09/2023

## 2023-05-09 NOTE — Op Note (Signed)
[facility]  Percutaneous Study/Intervention Procedural Note   Date of Surgery: 05/09/2023  Surgeon:  Levora Dredge  Pre-operative Diagnosis: Atherosclerotic occlusive disease bilateral lower extremities with right lower extremity with rest pain and ulceration.  Post-operative diagnosis:  Same  Procedure(s) Performed:             1.  Introduction catheter into right lower extremity 3rd order catheter placement               2.    Contrast injection right lower extremity for distal runoff             3.  Percutaneous transluminal angioplasty and stent placement of the superficial femoral and popliteal arteries in 2 locations to 6 mm maximal             4.  Percutaneous transluminal angioplasty and stent placement of the right anterior tibial artery to 4 mm             5.  Star close closure left common femoral arteriotomy  Anesthesia: Conscious sedation was administered under my direct supervision by the interventional radiology RN. IV Versed plus fentanyl were utilized. Continuous ECG, pulse oximetry and blood pressure was monitored throughout the entire procedure.  Conscious sedation was for a total of 53.  Sheath: 6 [place] Rabie left common femoral retrograde  Contrast: 55 cc  Fluoroscopy Time: 8.2 minutes  Indications:  Gerald Ellis presents with increasing pain of the right lower extremity.  He presented to [facility] with ulceration and gross cellulitis of his foot.  Gas was actually noted on plain films.  This suggests the patient is having limb threatening ischemia. The risks and benefits are reviewed all questions answered patient agrees to proceed.  Procedure:   Gerald Ellis is a 57 y.o. y.o. male who was identified and appropriate procedural time out was performed.  The patient was then placed supine on the table and prepped and draped in the usual sterile fashion.    Ultrasound was placed in the sterile sleeve and the  left groin was evaluated the left common femoral artery was echolucent and pulsatile indicating patency.  Image was recorded for the permanent record and under real-time visualization a microneedle was inserted into the common femoral artery followed by the microwire and then the micro-sheath.  A J-wire was then advanced through the micro-sheath and a  5 [place] sheath was then inserted over a J-wire. J-wire was then advanced and a 5 French pigtail catheter was positioned at the level of T12.  AP projection of the aorta was then obtained. Pigtail catheter was repositioned to above the bifurcation and a LAO view of the pelvis was obtained.  Subsequently a pigtail catheter with an Advantage wire was used to cross the aortic bifurcation.  The catheter and wire were advanced down into the right distal external iliac artery. Oblique view of the femoral bifurcation was then obtained and subsequently the wire was reintroduced and the pigtail catheter negotiated into the SFA representing third order catheter placement. Distal runoff was then performed.  8000 units of heparin was then given and allowed to circulate for several minutes.  A 6 French Rabie sheath was advanced up and over the bifurcation and positioned in the femoral artery  KMP catheter and advantage Glidewire were then negotiated down into the distal popliteal. Catheter was then advanced and subsequently the wire and catheter were negotiated into the anterior tibial artery.  Hand-injection of contrast  through the Kumpe catheter was then performed. Hand injection contrast demonstrated the tibial anatomy in further detail.  Beginning approximately in the SFA at [facility] canal a 7 mm x 80 mm life stent was deployed across the stenosis and then postdilated with a 6 mm x 80 mm Lutonix drug-eluting balloon inflated to 8 atm for 1 minute.  Follow-up imaging demonstrated less than 10% residual stenosis.  Then turned our attention to the mid to distal popliteal  occlusion which extends into the anterior tibial.  The detector was then positioned distally. A 4 mm x 100 mm Lutonix drug-eluting balloon was advanced into the anterior tibial by about 3 cm.  Inflation was to 8 atmospheres for 1 minute. Follow-up imaging demonstrated patency with greater than 70% residual stenosis throughout the lesion.  After appropriate measurements a 5 mm x 60 mm life stent is deployed into the anterior tibial again by approximately 30 mm.  A 6 mm x 60 mm life stent was deployed more proximally overlapping the 2 stents by 10 to 15 mm.  The distal portion was then postdilated with a 4 mm Lutonix drug-eluting balloon inflated to 8 atm for 1 minute.  More proximally a 5 mm Lutonix drug-eluting balloon was utilized and inflated to approximately 10 atm for 1 minute.  Follow-up imaging demonstrated less than 10% residual stenosis throughout the distal popliteal and proximal AT.  During the procedure to aliquots of intra-arterial nitroglycerin was administered.  Each dose of intra-arterial nitroglycerin was approximately 300 mcg.  A lesion which was focal and approximately 70 to 80% within the mid anterior tibial was then treated with a 3 mm x 100 mm Ultraverse balloon inflated to 8 atm for approximately 1 minute.  Follow-up imaging now demonstrated wide patency of the entire length of the anterior tibial with filling of the dorsalis pedis and the pedal arch.  There is less than 10% residual stenosis at the site of the lesion.  Distal runoff was then reassessed and noted to be widely patent.    After review of these images the sheath is pulled into the left external iliac oblique of the common femoral is obtained and a Star close device deployed. There no immediate Complications.  Findings:  The abdominal aorta is opacified with a bolus injection contrast. Renal arteries are single and widely patent without evidence of hemodynamically significant stenosis.  The aorta itself has diffuse disease  but no hemodynamically significant lesions. The common and external iliac arteries are widely patent bilaterally.  The right common femoral is widely patent as is the profunda femoris.  The SFA does indeed have a significant stenosis at Hunter's canal there is a string sign that extends over 5 cm in length.  The mid through distal popliteal demonstrates an occlusion that actually extends into the anterior tibial by several centimeters.  The tibioperoneal trunk posterior tibial and peroneal are completely nonvisualized initially.  Following angioplasty and stent placement at Hunter's canal there is less than 10% residual stenosis.  Following angioplasty and stent placement in the mid to distal popliteal extending approximately 2-1/2 to 3 cm into the anterior tibial there is now less than 10% residual stenosis.  Angioplasty of the mid anterior tibial demonstrates less than 10% residual stenosis.  On final imaging there is now inline flow via the SFA popliteal and anterior tibial down to the foot.  There is also now visualization with fairly brisk filling via collaterals of the peroneal down to the ankle as well.    Summary:  Successful recanalization right lower extremity for limb salvage                           Disposition: Patient was taken to the recovery room in stable condition having tolerated the procedure well.  Gerald Ellis 05/09/2023,12:40 PM

## 2023-05-09 NOTE — Progress Notes (Signed)
PHARMACIST - PHYSICIAN COMMUNICATION  CONCERNING:  Enoxaparin (Lovenox) for DVT Prophylaxis    RECOMMENDATION: Patient was prescribed enoxaprin 40mg  q24 hours for VTE prophylaxis.   Filed Weights   05/07/23 1918  Weight: 106.6 kg (235 lb)    Body mass index is 33.72 kg/m.  Estimated Creatinine Clearance: 115.9 mL/min (by C-G formula based on SCr of 0.87 mg/dL).   Based on [facility] policy patient is candidate for enoxaparin 0.5mg /kg TBW SQ every 24 hours based on BMI being >30.  DESCRIPTION: Pharmacy has adjusted enoxaparin dose per [facility] policy.  Patient is now receiving enoxaparin 52.5 mg every 24 hours    Merryl Hacker, PharmD Clinical Pharmacist  05/09/2023 4:11 PM

## 2023-05-09 NOTE — Progress Notes (Signed)
PROGRESS NOTE    Gerald Ellis  GEX:[id number] DOB: 1966/04/21 DOA: 05/07/2023 PCP: Jerl Mina, MD  Outpatient Specialists: podiatry, endo, cardiology    Brief Narrative:   From admission h and p Gerald Ellis is a 57 y.o. male with medical history significant for HTN, HLD, CAD s/p stent, DM with neuropathy, chronic back pain on opiates,, anxiety, chronic ulcer first right MTP, followed by podiatry (last seen 02/18/2023), a with normal resting ABI at the time and treated with topical dressings who presents to the ED with a 1 week history of blistering in the area of the wound and then over the past day he noted that his second and third toes of the right foot were turning black.  He denies fever or chills.    Assessment & Plan:   Principal Problem:   Diabetic foot infection (HCC) Active Problems:   Sepsis (HCC)   BPH with obstruction/lower urinary tract symptoms   CAD S/P percutaneous coronary angioplasty   HTN (hypertension)   PAD (peripheral artery disease) (HCC)   Chronic back pain   Anxiety   Obesity, Class III, BMI 40-49.9 (morbid obesity) (HCC)   Uncontrolled type 2 diabetes mellitus with hyperglycemia, with long-term current use of insulin (HCC)  # Diabetic foot infection # Sepsis # PAD # s/p TMA Sepsis by fever, tachycardia, leukocytosis. POD#1 from right TMA with podiatry. POD#0 from angiogram with angioplasty and stents to superficial femoral, popliteal, and tibial arteries. Signs ongoing infection and poor healing on podiatry's eval today but vascular thinks patient now well re-vascularized - continue zosyn/vanc  - monitor - continue statin, asa, effient  # T2DM Uncontrolled - dm educator consult - basal/bolus/ssi insulin, start mealtime, increase semglee to 35  # Obesity Noted  # Anxiety - home buspar  # Chronic low back pain - home gabapentin, hydrocodone, flexeril  # HTN Bp controlled - cont home , amlodipine, metop - holding  lisinopril  # CAD S/p DES x3 in 2020, no chest pain here - cont home asa, atorva, imdur, metop, effient  # Debility - PT consult pending   DVT prophylaxis: lovenox Code Status: full Family Communication: none @ bedside  Level of care: Med-Surg Status is: Inpatient Remains inpatient appropriate because: severity of illness    Consultants:  Podiatry, vascular  Procedures: See above  Antimicrobials:  See above   Subjective: Reports mild foot pain otherwise feeling well, tolerating diet  Objective: Vitals:   05/09/23 1300 05/09/23 1315 05/09/23 1330 05/09/23 1352  BP: 124/82 129/81 123/78 118/80  Pulse: 72 71 70 69  Resp: 16 18 16 20   Temp:    97.6 F (36.4 C)  TempSrc:      SpO2: 95% 96% 96% 95%  Weight:      Height:        Intake/Output Summary (Last 24 hours) at 05/09/2023 1605 Last data filed at 05/09/2023 0800 Gross per 24 hour  Intake 393.57 ml  Output 1750 ml  Net -1356.43 ml   Filed Weights   05/07/23 1918  Weight: 106.6 kg    Examination:  General exam: Appears calm and comfortable  Respiratory system: Clear to auscultation. Respiratory effort normal. Cardiovascular system: S1 & S2 heard, RRR.   Gastrointestinal system: Abdomen is nondistended, soft and nontender.   Central nervous system: Alert and oriented. No focal neurological deficits. Extremities: Symmetric 5 x 5 power. Skin: No rashes, lesions or ulcers. Right foot is wrapped Psychiatry: Judgement and insight appear normal. Mood & affect appropriate.  Data Reviewed: I have personally reviewed following labs and imaging studies  CBC: Recent Labs  Lab 05/07/23 1921 05/08/23 0431 05/09/23 0428  WBC 15.0* 12.8* 14.5*  NEUTROABS 12.3*  --   --   HGB 12.1* 10.4* 10.6*  HCT 36.3* 29.9* 30.4*  MCV 87.1 83.3 83.1  PLT 491* 407* 414*   Basic Metabolic Panel: Recent Labs  Lab 05/07/23 1921 05/08/23 0431 05/09/23 0428  NA 130* 133* 132*  K 4.1 3.5 3.7  CL 94* 100 100   CO2 25 22 23   GLUCOSE 396* 205* 265*  BUN 12 10 17   CREATININE 0.98 0.91 0.87  CALCIUM 8.4* 7.9* 7.9*   GFR: Estimated Creatinine Clearance: 115.9 mL/min (by C-G formula based on SCr of 0.87 mg/dL). Liver Function Tests: Recent Labs  Lab 05/07/23 1921  AST 36  ALT 41  ALKPHOS 97  BILITOT 0.9  PROT 8.3*  ALBUMIN 2.6*   No results for input(s): "LIPASE", "AMYLASE" in the last 168 hours. No results for input(s): "AMMONIA" in the last 168 hours. Coagulation Profile: No results for input(s): "INR", "PROTIME" in the last 168 hours. Cardiac Enzymes: No results for input(s): "CKTOTAL", "CKMB", "CKMBINDEX", "TROPONINI" in the last 168 hours. BNP (last 3 results) No results for input(s): "PROBNP" in the last 8760 hours. HbA1C: Recent Labs    05/07/23 1921  HGBA1C 11.2*   CBG: Recent Labs  Lab 05/09/23 0002 05/09/23 0440 05/09/23 0831 05/09/23 1107 05/09/23 1400  GLUCAP 294* 260* 226* 190* 197*   Lipid Profile: No results for input(s): "CHOL", "HDL", "LDLCALC", "TRIG", "CHOLHDL", "LDLDIRECT" in the last 72 hours. Thyroid Function Tests: No results for input(s): "TSH", "T4TOTAL", "FREET4", "T3FREE", "THYROIDAB" in the last 72 hours. Anemia Panel: No results for input(s): "VITAMINB12", "FOLATE", "FERRITIN", "TIBC", "IRON", "RETICCTPCT" in the last 72 hours. Urine analysis:    Component Value Date/Time   COLORURINE YELLOW (A) 06/12/2015 1252   APPEARANCEUR CLEAR (A) 06/12/2015 1252   LABSPEC 1.042 (H) 06/12/2015 1252   PHURINE 6.0 06/12/2015 1252   GLUCOSEU >500 (A) 06/12/2015 1252   HGBUR NEGATIVE 06/12/2015 1252   BILIRUBINUR NEGATIVE 06/12/2015 1252   KETONESUR TRACE (A) 06/12/2015 1252   PROTEINUR 100 (A) 06/12/2015 1252   NITRITE NEGATIVE 06/12/2015 1252   LEUKOCYTESUR NEGATIVE 06/12/2015 1252   Sepsis Labs: @LABRCNTIP (procalcitonin:4,lacticidven:4)  ) Recent Results (from the past 240 hours)  Blood culture (single)     Status: None (Preliminary result)    Collection Time: 05/07/23  7:21 PM   Specimen: BLOOD  Result Value Ref Range Status   Specimen Description BLOOD LEFT FOREARM  Final   Special Requests   Final    BOTTLES DRAWN AEROBIC AND ANAEROBIC Blood Culture adequate volume   Culture   Final    NO GROWTH 2 DAYS Performed at [facility], [address], [place], [place] [address]    Report Status PENDING  Incomplete  Surgical pcr screen     Status: Abnormal   Collection Time: 05/08/23  8:51 AM   Specimen: Nasal Mucosa; Nasal Swab  Result Value Ref Range Status   MRSA, PCR NEGATIVE NEGATIVE Final   Staphylococcus aureus POSITIVE (A) NEGATIVE Final    Comment: (NOTE) The Xpert SA Assay (FDA approved for NASAL specimens in patients 24 years of age and older), is one component of a comprehensive surveillance program. It is not intended to diagnose infection nor to guide or monitor treatment. Performed at [facility], [address] [place], [place] [address]   Aerobic/Anaerobic  Culture w Gram Stain (surgical/deep wound)     Status: None (Preliminary result)   Collection Time: 05/08/23 11:54 AM   Specimen: Path Tissue  Result Value Ref Range Status   Specimen Description   Final    TISSUE Performed at [facility], [address], [place], [place] [address]    Special Requests SWAB OF RIGHT FOOT DEEP ABSCESS  Final   Gram Stain NO WBC SEEN FEW GRAM POSITIVE COCCI IN PAIRS   Final   Culture   Final    CULTURE REINCUBATED FOR BETTER GROWTH Performed at [facility], [address] [place], [place] [address]    Report Status PENDING  Incomplete         Radiology Studies: PERIPHERAL VASCULAR CATHETERIZATION Result Date: 05/09/2023 See surgical note for result.  MR FOOT RIGHT WO CONTRAST Result Date: 05/08/2023 CLINICAL DATA:  Soft tissue infection suspected. Right foot blistering for a few months with recent onset of discoloration of the 2nd toe. EXAM: MRI OF THE RIGHT  FOREFOOT WITHOUT CONTRAST TECHNIQUE: Multiplanar, multisequence MR imaging of the right forefoot was performed. No intravenous contrast was administered. COMPARISON:  Radiographs same date. No other comparison studies available. FINDINGS: Technical note: Despite efforts by the technologist and patient, mild motion artifact is present on today's exam and could not be eliminated. This reduces exam sensitivity and specificity. Bones/Joint/Cartilage As shown on same-day radiographs, there is attenuation of the soft tissues of the 2nd toe. No underlying cortical destruction or marrow signal abnormality is identified to suggest osteomyelitis. There is no evidence of acute fracture or dislocation. No significant joint effusions are identified. The alignment is normal at the Lisfranc joint. Ligaments Intact Lisfranc ligament. The collateral ligaments of the metatarsophalangeal joints appear intact. Muscles and Tendons Generalized forefoot muscular atrophy. Dorsal soft tissue emphysema with prominent fluid in the extensor tendon sheaths of the 2nd ray. There is a small amount of fluid within the flexor digitorum tendon sheaths. Soft tissues As seen on radiographs, there is extensive soft tissue emphysema within the medial aspect of the forefoot, extending into the 1st and 2nd web spaces. There is associated ill-defined surrounding fluid, greatest dorsally. This process extends proximally to the level of the mid 2nd metatarsal. These findings remain highly concerning for necrotizing soft tissue infection. No well-defined soft tissue abscess. IMPRESSION: 1. Extensive soft tissue emphysema within the medial aspect of the forefoot, extending into the 1st and 2nd web spaces, with associated ill-defined surrounding fluid, greatest dorsally. These findings remain highly concerning for necrotizing soft tissue infection. No focal abscess identified. 2. No evidence of osteomyelitis or septic arthritis. 3. Prominent fluid in the  extensor tendon sheaths of the 2nd ray, likely septic tenosynovitis. Electronically Signed   By: Carey Bullocks M.D.   On: 05/08/2023 10:06   DG Foot Complete Right Result Date: 05/07/2023 CLINICAL DATA:  Foot infection second toe is black EXAM: RIGHT FOOT COMPLETE - 3+ VIEW COMPARISON:  Report 04/06/2020 FINDINGS: No fracture or malalignment. Wound or ulcer plantar aspect of the distal foot. Gas within the soft tissues diffusely surrounding the second digit from the level of the distal metatarsal to the distal phalanx. Additional gas present within the soft tissues of the medial first digit, between the second and third digits, and between the fourth and fifth digits. No obvious osseous destructive change. IMPRESSION: Wound or ulcer plantar aspect of the distal foot with gas within the soft tissues diffusely surrounding the second digit and additional gas present within  the soft tissues of the medial first digit, between the second and third digits, and between the fourth and fifth digits. Findings are suspicious for necrotic soft tissue infection. No definite acute osseous abnormality. Electronically Signed   By: Jasmine Pang M.D.   On: 05/07/2023 21:06        Scheduled Meds:  amLODipine  5 mg Oral Daily   aspirin  81 mg Oral Daily   atorvastatin  80 mg Oral q1800   busPIRone  7.5 mg Oral BID   Chlorhexidine Gluconate Cloth  6 each Topical Q0600   feeding supplement  237 mL Oral BID BM   HYDROcodone-acetaminophen       insulin aspart  0-20 Units Subcutaneous Q4H   insulin glargine-yfgn  30 Units Subcutaneous QHS   isosorbide mononitrate  30 mg Oral Daily   metoprolol succinate  50 mg Oral Daily   mupirocin ointment  1 Application Nasal BID   omega-3 acid ethyl esters  1 g Oral BID   pantoprazole  40 mg Oral BID   prasugrel  10 mg Oral Daily   sodium chloride flush  3 mL Intravenous Q12H   Continuous Infusions:  sodium chloride 100 mL/hr at 05/09/23 1353   sodium chloride      ceFEPime (MAXIPIME) IV 2 g (05/09/23 1357)   vancomycin Stopped (05/09/23 1154)     LOS: 2 days     Silvano Bilis, MD [facility]   If 7PM-7AM, please contact night-coverage [email or web address] Password [email or web address] 05/09/2023, 4:05 PM

## 2023-05-10 DIAGNOSIS — E11628 Type 2 diabetes mellitus with other skin complications: Secondary | ICD-10-CM | POA: Diagnosis not present

## 2023-05-10 DIAGNOSIS — L089 Local infection of the skin and subcutaneous tissue, unspecified: Secondary | ICD-10-CM | POA: Diagnosis not present

## 2023-05-10 LAB — CBC
HCT: 28.4 % — ABNORMAL LOW (ref 39.0–52.0)
Hemoglobin: 9.7 g/dL — ABNORMAL LOW (ref 13.0–17.0)
MCH: 28.8 pg (ref 26.0–34.0)
MCHC: 34.2 g/dL (ref 30.0–36.0)
MCV: 84.3 fL (ref 80.0–100.0)
Platelets: 397 10*3/uL (ref 150–400)
RBC: 3.37 MIL/uL — ABNORMAL LOW (ref 4.22–5.81)
RDW: 12.7 % (ref 11.5–15.5)
WBC: 12.9 10*3/uL — ABNORMAL HIGH (ref 4.0–10.5)
nRBC: 0 % (ref 0.0–0.2)

## 2023-05-10 LAB — BASIC METABOLIC PANEL
Anion gap: 6 (ref 5–15)
BUN: 20 mg/dL (ref 6–20)
CO2: 25 mmol/L (ref 22–32)
Calcium: 7.8 mg/dL — ABNORMAL LOW (ref 8.9–10.3)
Chloride: 102 mmol/L (ref 98–111)
Creatinine, Ser: 0.86 mg/dL (ref 0.61–1.24)
GFR, Estimated: >60 mL/min (ref >60)
Glucose, Bld: 216 mg/dL — ABNORMAL HIGH (ref 70–99)
Potassium: 3.5 mmol/L (ref 3.5–5.1)
Sodium: 133 mmol/L — ABNORMAL LOW (ref 135–145)

## 2023-05-10 LAB — GLUCOSE, CAPILLARY
Glucose-Capillary: 208 mg/dL — ABNORMAL HIGH (ref 70–99)
Glucose-Capillary: 116 mg/dL — ABNORMAL HIGH (ref 70–99)
Glucose-Capillary: 213 mg/dL — ABNORMAL HIGH (ref 70–99)
Glucose-Capillary: 239 mg/dL — ABNORMAL HIGH (ref 70–99)
Glucose-Capillary: 188 mg/dL — ABNORMAL HIGH (ref 70–99)

## 2023-05-10 MED ORDER — SODIUM CHLORIDE 0.9% FLUSH
3.0000 mL | Freq: Two times a day (BID) | INTRAVENOUS | Status: DC
  Administered 2023-05-10: 10 mL via INTRAVENOUS
  Administered 2023-05-11: 3 mL via INTRAVENOUS
  Administered 2023-05-11: 10 mL via INTRAVENOUS
  Administered 2023-05-12: 5 mL via INTRAVENOUS
  Administered 2023-05-12: 3 mL via INTRAVENOUS
  Administered 2023-05-13: 4 mL via INTRAVENOUS
  Administered 2023-05-14 – 2023-05-15 (×2): 3 mL via INTRAVENOUS
  Administered 2023-05-16: 10 mL via INTRAVENOUS
  Administered 2023-05-16: 3 mL via INTRAVENOUS
  Administered 2023-05-17 – 2023-05-20 (×4): 10 mL via INTRAVENOUS
  Administered 2023-05-20: 3 mL via INTRAVENOUS
  Administered 2023-05-21: 10 mL via INTRAVENOUS
  Administered 2023-05-21: 3 mL via INTRAVENOUS
  Administered 2023-05-22 – 2023-05-23 (×4): 10 mL via INTRAVENOUS
  Administered 2023-05-24 (×2): 3 mL via INTRAVENOUS
  Administered 2023-05-26: 10 mL via INTRAVENOUS

## 2023-05-10 MED ORDER — SODIUM CHLORIDE 0.9% FLUSH: 3.0000 mL | INTRAVENOUS | Status: DC | PRN

## 2023-05-10 NOTE — Evaluation (Signed)
Physical Therapy Evaluation Patient Details Name: Gerald Ellis MRN: [id number] DOB: 10-27-1965 Today's Date: 05/10/2023  History of Present Illness  57 y.o. male with medical history significant for HTN, HLD, CAD s/p stent, DM with neuropathy, chronic back pain on opiates,, anxiety, chronic ulcer first right MTP, followed by podiatry (last seen 02/18/2023), a with normal resting ABI at the time and treated with topical dressings who presents to the ED with a 1 week history of blistering in the area of the wound and then over the past day he noted that his second and third toes of the right foot were turning black. 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 NWB to RLE,  Clinical Impression  Pt received in recliner eager to participate in therapy evaluation. Pt PLOf living alone independently and driving. Pt plans to stay with sister  to recover well where the home has ramped entrance and has 24/7 help available. PT assessment revealed pt is able to comply with NWB to RLE 100% of the time. Pt Needed CGA to Sup for transfers and ambulated with FWW hopping on LLE with pt tired at 25 ft. Pt left in chair with all needs within reach. Pt allowed to walk in the room with nursing with NWB to RLE to use bathroom. Exs to BLE performed and provided written instructions on white board for pt to perform every 3 hours 10 reps each. Pt shows good understanding of education. PT will continue in Acute. Pt referred to mobility specialists. Pt will benefit from 3 x week PT after Acute to return to PLOF.       If plan is discharge home, recommend the following: A little help with walking and/or transfers;A little help with bathing/dressing/bathroom;Assistance with cooking/housework;Assist for transportation;Help with stairs or ramp for entrance   Can travel by private vehicle        Equipment Recommendations Rolling walker (2 wheels);BSC/3in1  Recommendations for Other Services       Functional Status  Assessment Patient has had a recent decline in their functional status and demonstrates the ability to make significant improvements in function in a reasonable and predictable amount of time.     Precautions / Restrictions Precautions Precautions: Fall Restrictions Weight Bearing Restrictions Per Provider Order: Yes RLE Weight Bearing Per Provider Order: Non weight bearing      Mobility  Bed Mobility               General bed mobility comments: received in chair.    Transfers Overall transfer level: Needs assistance Equipment used: Rolling walker (2 wheels) Transfers: Sit to/from Stand Sit to Stand: Contact guard assist           General transfer comment: NWB to RLE maintained    Ambulation/Gait Ambulation/Gait assistance: Contact guard assist Gait Distance (Feet): 48 Feet Assistive device: Rolling walker (2 wheels) Gait Pattern/deviations:  (Hopping) Gait velocity: dec     General Gait Details: NWB to RLE, hopping on LLE. tires easily  Careers information officer     Tilt Bed    Modified Rankin (Stroke Patients Only)       Balance Overall balance assessment: Needs assistance Sitting-balance support: No upper extremity supported, Feet unsupported Sitting balance-Leahy Scale: Normal     Standing balance support: Bilateral upper extremity supported Standing balance-Leahy Scale: Good Standing balance comment: No LOB noted but tires easily.  Pertinent Vitals/Pain Pain Assessment Pain Assessment: No/denies pain    Home Living Family/patient expects to be discharged to:: Private residence Living Arrangements: Alone Available Help at Discharge: Family;Available 24 hours/day Type of Home: House Home Access: Ramped entrance       Home Layout: One level        Prior Function Prior Level of Function : Independent/Modified Independent;Driving             Mobility Comments: Ind  ambulator at household level and community level activity participation withotu AD and Driving. ADLs Comments: indep with ADL and IADL including driving     Extremity/Trunk Assessment   Upper Extremity Assessment Upper Extremity Assessment: Defer to OT evaluation    Lower Extremity Assessment Lower Extremity Assessment: Generalized weakness;RLE deficits/detail RLE Deficits / Details: s/p transmet amp RLE: Unable to fully assess due to pain RLE Sensation: decreased light touch (below ankle) RLE Coordination: WNL       Communication   Communication Communication: No apparent difficulties  Cognition Arousal: Alert Behavior During Therapy: WFL for tasks assessed/performed Overall Cognitive Status: Within Functional Limits for tasks assessed                                          General Comments      Exercises General Exercises - Lower Extremity Ankle Circles/Pumps: AROM, 10 reps, Seated Gluteal Sets: 10 reps, Seated Long Arc Quad: AROM, 10 reps, Seated, Both Straight Leg Raises: AROM, Both, 10 reps, Seated Other Exercises Other Exercises: same every 3 hours whil ein bed or chair 10 reps each. Writtine instrucitons on [employer] provided.   Assessment/Plan    PT Assessment Patient needs continued PT services  PT Problem List Decreased strength;Decreased range of motion;Decreased activity tolerance;Decreased balance;Decreased skin integrity;Decreased mobility       PT Treatment Interventions Gait training;Functional mobility training;Therapeutic activities;Neuromuscular re-education;Therapeutic exercise;Balance training;Patient/family education    PT Goals (Current goals can be found in the Care Plan section)  Acute Rehab PT Goals Patient Stated Goal: " Want to go and stay at  my sister's home till everything gets better." PT Goal Formulation: With patient Time For Goal Achievement: 05/24/23 Potential to Achieve Goals: Good    Frequency Min  1X/week     Co-evaluation               AM-PAC PT "6 Clicks" Mobility  Outcome Measure Help needed turning from your back to your side while in a flat bed without using bedrails?: None Help needed moving from lying on your back to sitting on the side of a flat bed without using bedrails?: None Help needed moving to and from a bed to a chair (including a wheelchair)?: A Little Help needed standing up from a chair using your arms (e.g., wheelchair or bedside chair)?: A Little Help needed to walk in hospital room?: A Little Help needed climbing 3-5 steps with a railing? : A Lot 6 Click Score: 19    End of Session Equipment Utilized During Treatment: Gait belt Activity Tolerance: Patient tolerated treatment well;Patient limited by fatigue Patient left: in chair;with call bell/phone within reach;with chair alarm set Nurse Communication: Mobility status;Weight bearing status PT Visit Diagnosis: Muscle weakness (generalized) (M62.81);Difficulty in walking, not elsewhere classified (R26.2);Other abnormalities of gait and mobility (R26.89)    Time: 2951-8841 PT Time Calculation (min) (ACUTE ONLY): 19 min   Charges:   PT  Evaluation $PT Eval Low Complexity: 1 Low   PT General Charges $$ ACUTE PT VISIT: 1 Visit         Janet Berlin PT DPT 11:30 AM,05/10/23

## 2023-05-10 NOTE — Progress Notes (Signed)
      Daily Progress Note   Assessment/Planning:   POD #1 s/p PTA_S R fem-pop & ATA  No access site complications Keep pt on ASA (Plavix would interact with PPI) Follow up in office in 2 weeks with Dr. Lorretta Harp   Subjective  - 1 Day Post-Op   Foot feels better   Objective   Vitals:   05/09/23 2028 05/09/23 2320 05/10/23 0451 05/10/23 0751  BP: 110/60 125/73 134/79 134/80  Pulse: 78 81 77 77  Resp: 16 16 16 20   Temp: 98.3 F (36.8 C) 98.5 F (36.9 C) 98.3 F (36.8 C) 98.7 F (37.1 C)  TempSrc: Oral Oral Oral   SpO2: 95% 95% 95% 98%  Weight:      Height:         Intake/Output Summary (Last 24 hours) at 05/10/2023 0820 Last data filed at 05/10/2023 0600 Gross per 24 hour  Intake 717 ml  Output 1700 ml  Net -983 ml   VASC: L groin without hematoma or echymosis, R distal leg warm, bandaged foot, faintly palpable ATA  Laboratory   CBC    Latest Ref Rng & Units 05/10/2023    4:12 AM 05/09/2023    4:28 AM 05/08/2023    4:31 AM  CBC  WBC 4.0 - 10.5 K/uL 12.9  14.5  12.8   Hemoglobin 13.0 - 17.0 g/dL 9.7  16.1  09.6   Hematocrit 39.0 - 52.0 % 28.4  30.4  29.9   Platelets 150 - 400 K/uL 397  414  407     BMET    Component Value Date/Time   NA 133 (L) 05/10/2023 0412   K 3.5 05/10/2023 0412   CL 102 05/10/2023 0412   CO2 25 05/10/2023 0412   GLUCOSE 216 (H) 05/10/2023 0412   BUN 20 05/10/2023 0412   CREATININE 0.86 05/10/2023 0412   CALCIUM 7.8 (L) 05/10/2023 0412   GFRNONAA >60 05/10/2023 0412   GFRAA >60 04/21/2019 0515     Leonides Sake, MD, FACS, FSVS Covering for [facility]: [phone]  05/10/2023, 8:20 AM

## 2023-05-10 NOTE — Inpatient Diabetes Management (Addendum)
Inpatient Diabetes Program Recommendations  AACE/ADA: New Consensus Statement on Inpatient Glycemic Control  Target Ranges:  Prepandial:   less than 140 mg/dL      Peak postprandial:   less than 180 mg/dL (1-2 hours)      Critically ill patients:  140 - 180 mg/dL     Review of Glycemic Control  Latest Reference Range & Units 05/09/23 14:00 05/09/23 16:30 05/09/23 20:27 05/09/23 23:21 05/10/23 04:53 05/10/23 07:51 05/10/23 11:24  Glucose-Capillary 70 - 99 mg/dL 161 (H) 096 (H) 045 (H) 221 (H) 208 (H) 116 (H) 213 (H)   Diabetes history: DM2 Outpatient Diabetes medications: Humalog 70 units TID with meals, Tresiba 142 units at bedtime, Metformin 1000 mg BID, Actos 15 mg BID Current orders for Inpatient glycemic control:  Semglee 35 units daily at bedtime Novolog 0-20 units Q4H Novolog 3 units tid meal coverage  Decadron 5 mg given lunchtime on 12/19  -   Increase Novolog meal coverage to 6 units tid if eating >50% of meals  Thanks, Christena Deem RN, MSN, BC-ADM Inpatient Diabetes Coordinator Team Pager [phone] (8a-5p)

## 2023-05-10 NOTE — Progress Notes (Addendum)
PROGRESS NOTE    Gerald Ellis  WUJ:[id number] DOB: 1965-08-16 DOA: 05/07/2023 PCP: Jerl Mina, MD  Outpatient Specialists: podiatry, endo, cardiology    Brief Narrative:   From admission h and p Gerald Ellis is a 57 y.o. male with medical history significant for HTN, HLD, CAD s/p stent, DM with neuropathy, chronic back pain on opiates,, anxiety, chronic ulcer first right MTP, followed by podiatry (last seen 02/18/2023), a with normal resting ABI at the time and treated with topical dressings who presents to the ED with a 1 week history of blistering in the area of the wound and then over the past day he noted that his second and third toes of the right foot were turning black.  He denies fever or chills.    Assessment & Plan:   Principal Problem:   Diabetic foot infection (HCC) Active Problems:   Sepsis (HCC)   BPH with obstruction/lower urinary tract symptoms   CAD S/P percutaneous coronary angioplasty   HTN (hypertension)   PAD (peripheral artery disease) (HCC)   Chronic back pain   Anxiety   Obesity, Class III, BMI 40-49.9 (morbid obesity) (HCC)   Uncontrolled type 2 diabetes mellitus with hyperglycemia, with long-term current use of insulin (HCC)  # Diabetic foot infection # Sepsis # PAD # s/p TMA Sepsis by fever, tachycardia, leukocytosis. POD#2 from right TMA with podiatry. POD#1 from angiogram with angioplasty and stents to superficial femoral, popliteal, and tibial arteries. Signs ongoing infection and poor healing on podiatry's eval but vascular thinks patient now well re-vascularized - continue zosyn/vanc  - monitor - continue statin, asa, effient - podiatry to re-eval tomorrow - follow cultures, ngtd  # T2DM Uncontrolled - dm educator consulted - basal/bolus/ssi insulin, continue mealtime, increase semglee to 40  # Obesity Noted  # Anxiety - home buspar  # Chronic low back pain - home gabapentin, hydrocodone, flexeril  # HTN Bp  controlled - cont home , amlodipine, metop - holding lisinopril  # CAD S/p DES x3 in 2020, no chest pain here - cont home asa, atorva, imdur, metop, effient  # Debility - PT/OT advising HH for the time being, that obviously could change if additional amputation performed   DVT prophylaxis: lovenox Code Status: full Family Communication: sister updated @ bedside 12/21  Level of care: Med-Surg Status is: Inpatient Remains inpatient appropriate because: severity of illness    Consultants:  Podiatry, vascular  Procedures: See above  Antimicrobials:  See above   Subjective: Reports controlled foot pain otherwise feeling well, tolerating diet  Objective: Vitals:   05/09/23 2028 05/09/23 2320 05/10/23 0451 05/10/23 0751  BP: 110/60 125/73 134/79 134/80  Pulse: 78 81 77 77  Resp: 16 16 16 20   Temp: 98.3 F (36.8 C) 98.5 F (36.9 C) 98.3 F (36.8 C) 98.7 F (37.1 C)  TempSrc: Oral Oral Oral   SpO2: 95% 95% 95% 98%  Weight:      Height:        Intake/Output Summary (Last 24 hours) at 05/10/2023 1226 Last data filed at 05/10/2023 0600 Gross per 24 hour  Intake 717 ml  Output 1700 ml  Net -983 ml   Filed Weights   05/07/23 1918  Weight: 106.6 kg    Examination:  General exam: Appears calm and comfortable  Respiratory system: Clear to auscultation. Respiratory effort normal. Cardiovascular system: S1 & S2 heard, RRR.   Gastrointestinal system: Abdomen is nondistended, soft and nontender.   Central nervous system: Alert and oriented.  No focal neurological deficits. Extremities: Symmetric 5 x 5 power. Skin: No visible rashes, lesions or ulcers. Right foot is wrapped Psychiatry: Judgement and insight appear normal. Mood & affect appropriate.     Data Reviewed: I have personally reviewed following labs and imaging studies  CBC: Recent Labs  Lab 05/07/23 1921 05/08/23 0431 05/09/23 0428 05/10/23 0412  WBC 15.0* 12.8* 14.5* 12.9*  NEUTROABS 12.3*   --   --   --   HGB 12.1* 10.4* 10.6* 9.7*  HCT 36.3* 29.9* 30.4* 28.4*  MCV 87.1 83.3 83.1 84.3  PLT 491* 407* 414* 397   Basic Metabolic Panel: Recent Labs  Lab 05/07/23 1921 05/08/23 0431 05/09/23 0428 05/10/23 0412  NA 130* 133* 132* 133*  K 4.1 3.5 3.7 3.5  CL 94* 100 100 102  CO2 25 22 23 25   GLUCOSE 396* 205* 265* 216*  BUN 12 10 17 20   CREATININE 0.98 0.91 0.87 0.86  CALCIUM 8.4* 7.9* 7.9* 7.8*   GFR: Estimated Creatinine Clearance: 117.2 mL/min (by C-G formula based on SCr of 0.86 mg/dL). Liver Function Tests: Recent Labs  Lab 05/07/23 1921  AST 36  ALT 41  ALKPHOS 97  BILITOT 0.9  PROT 8.3*  ALBUMIN 2.6*   No results for input(s): "LIPASE", "AMYLASE" in the last 168 hours. No results for input(s): "AMMONIA" in the last 168 hours. Coagulation Profile: No results for input(s): "INR", "PROTIME" in the last 168 hours. Cardiac Enzymes: No results for input(s): "CKTOTAL", "CKMB", "CKMBINDEX", "TROPONINI" in the last 168 hours. BNP (last 3 results) No results for input(s): "PROBNP" in the last 8760 hours. HbA1C: Recent Labs    05/07/23 1921  HGBA1C 11.2*   CBG: Recent Labs  Lab 05/09/23 2027 05/09/23 2321 05/10/23 0453 05/10/23 0751 05/10/23 1124  GLUCAP 227* 221* 208* 116* 213*   Lipid Profile: No results for input(s): "CHOL", "HDL", "LDLCALC", "TRIG", "CHOLHDL", "LDLDIRECT" in the last 72 hours. Thyroid Function Tests: No results for input(s): "TSH", "T4TOTAL", "FREET4", "T3FREE", "THYROIDAB" in the last 72 hours. Anemia Panel: No results for input(s): "VITAMINB12", "FOLATE", "FERRITIN", "TIBC", "IRON", "RETICCTPCT" in the last 72 hours. Urine analysis:    Component Value Date/Time   COLORURINE YELLOW (A) 06/12/2015 1252   APPEARANCEUR CLEAR (A) 06/12/2015 1252   LABSPEC 1.042 (H) 06/12/2015 1252   PHURINE 6.0 06/12/2015 1252   GLUCOSEU >500 (A) 06/12/2015 1252   HGBUR NEGATIVE 06/12/2015 1252   BILIRUBINUR NEGATIVE 06/12/2015 1252    KETONESUR TRACE (A) 06/12/2015 1252   PROTEINUR 100 (A) 06/12/2015 1252   NITRITE NEGATIVE 06/12/2015 1252   LEUKOCYTESUR NEGATIVE 06/12/2015 1252   Sepsis Labs: @LABRCNTIP (procalcitonin:4,lacticidven:4)  ) Recent Results (from the past 240 hours)  Blood culture (single)     Status: None (Preliminary result)   Collection Time: 05/07/23  7:21 PM   Specimen: BLOOD  Result Value Ref Range Status   Specimen Description BLOOD LEFT FOREARM  Final   Special Requests   Final    BOTTLES DRAWN AEROBIC AND ANAEROBIC Blood Culture adequate volume   Culture   Final    NO GROWTH 3 DAYS Performed at [facility], [address], [place], [place] [address]    Report Status PENDING  Incomplete  Surgical pcr screen     Status: Abnormal   Collection Time: 05/08/23  8:51 AM   Specimen: Nasal Mucosa; Nasal Swab  Result Value Ref Range Status   MRSA, PCR NEGATIVE NEGATIVE Final   Staphylococcus aureus POSITIVE (A) NEGATIVE Final  Comment: (NOTE) The Xpert SA Assay (FDA approved for NASAL specimens in patients 68 years of age and older), is one component of a comprehensive surveillance program. It is not intended to diagnose infection nor to guide or monitor treatment. Performed at [facility], [address] [place], [place] [address]   Aerobic/Anaerobic Culture w Gram Stain (surgical/deep wound)     Status: None (Preliminary result)   Collection Time: 05/08/23 11:54 AM   Specimen: Path Tissue  Result Value Ref Range Status   Specimen Description   Final    TISSUE Performed at [facility], [address], [place], [place] [address]    Special Requests SWAB OF RIGHT FOOT DEEP ABSCESS  Final   Gram Stain NO WBC SEEN FEW GRAM POSITIVE COCCI IN PAIRS   Final   Culture   Final    CULTURE REINCUBATED FOR BETTER GROWTH Performed at [facility], [address] [place], [place] [address]    Report Status PENDING  Incomplete         Radiology  Studies: PERIPHERAL VASCULAR CATHETERIZATION Result Date: 05/09/2023 See surgical note for result.       Scheduled Meds:  amLODipine  5 mg Oral Daily   aspirin  81 mg Oral Daily   atorvastatin  80 mg Oral q1800   busPIRone  7.5 mg Oral BID   Chlorhexidine Gluconate Cloth  6 each Topical Q0600   enoxaparin (LOVENOX) injection  0.5 mg/kg Subcutaneous Daily   feeding supplement  237 mL Oral BID BM   insulin aspart  0-20 Units Subcutaneous Q4H   insulin aspart  3 Units Subcutaneous TID WC   insulin glargine-yfgn  35 Units Subcutaneous QHS   isosorbide mononitrate  30 mg Oral Daily   metoprolol succinate  50 mg Oral Daily   mupirocin ointment  1 Application Nasal BID   omega-3 acid ethyl esters  1 g Oral BID   pantoprazole  40 mg Oral BID   prasugrel  10 mg Oral Daily   sodium chloride flush  3 mL Intravenous Q12H   sodium chloride flush  3-10 mL Intravenous Q12H   Continuous Infusions:  ceFEPime (MAXIPIME) IV 2 g (05/10/23 1223)   vancomycin 1,000 mg (05/10/23 1003)     LOS: 3 days     Silvano Bilis, MD [facility]   If 7PM-7AM, please contact night-coverage [email or web address] Password [facility] 05/10/2023, 12:26 PM

## 2023-05-10 NOTE — Plan of Care (Signed)
  Problem: Elimination: Goal: Will not experience complications related to bowel motility Outcome: Progressing   Problem: Nutrition: Goal: Adequate nutrition will be maintained Outcome: Progressing   Problem: Activity: Goal: Risk for activity intolerance will decrease Outcome: Progressing   Problem: Coping: Goal: Level of anxiety will decrease Outcome: Progressing   Problem: Clinical Measurements: Goal: Ability to maintain clinical measurements within normal limits will improve Outcome: Progressing

## 2023-05-11 ENCOUNTER — Inpatient Hospital Stay: Payer: PPO

## 2023-05-11 DIAGNOSIS — L089 Local infection of the skin and subcutaneous tissue, unspecified: Secondary | ICD-10-CM | POA: Diagnosis not present

## 2023-05-11 DIAGNOSIS — E11628 Type 2 diabetes mellitus with other skin complications: Secondary | ICD-10-CM | POA: Diagnosis not present

## 2023-05-11 LAB — CBC
HCT: 28.3 % — ABNORMAL LOW (ref 39.0–52.0)
Hemoglobin: 9.5 g/dL — ABNORMAL LOW (ref 13.0–17.0)
MCH: 29.1 pg (ref 26.0–34.0)
MCHC: 33.6 g/dL (ref 30.0–36.0)
MCV: 86.5 fL (ref 80.0–100.0)
Platelets: 362 10*3/uL (ref 150–400)
RBC: 3.27 MIL/uL — ABNORMAL LOW (ref 4.22–5.81)
RDW: 12.8 % (ref 11.5–15.5)
WBC: 10.5 10*3/uL (ref 4.0–10.5)
nRBC: 0 % (ref 0.0–0.2)

## 2023-05-11 LAB — BASIC METABOLIC PANEL
Anion gap: 8 (ref 5–15)
BUN: 19 mg/dL (ref 6–20)
CO2: 23 mmol/L (ref 22–32)
Calcium: 7.6 mg/dL — ABNORMAL LOW (ref 8.9–10.3)
Chloride: 101 mmol/L (ref 98–111)
Creatinine, Ser: 0.93 mg/dL (ref 0.61–1.24)
GFR, Estimated: >60 mL/min (ref >60)
Glucose, Bld: 172 mg/dL — ABNORMAL HIGH (ref 70–99)
Potassium: 3.6 mmol/L (ref 3.5–5.1)
Sodium: 132 mmol/L — ABNORMAL LOW (ref 135–145)

## 2023-05-11 LAB — GLUCOSE, CAPILLARY
Glucose-Capillary: 115 mg/dL — ABNORMAL HIGH (ref 70–99)
Glucose-Capillary: 133 mg/dL — ABNORMAL HIGH (ref 70–99)
Glucose-Capillary: 147 mg/dL — ABNORMAL HIGH (ref 70–99)
Glucose-Capillary: 172 mg/dL — ABNORMAL HIGH (ref 70–99)
Glucose-Capillary: 188 mg/dL — ABNORMAL HIGH (ref 70–99)
Glucose-Capillary: 200 mg/dL — ABNORMAL HIGH (ref 70–99)
Glucose-Capillary: 252 mg/dL — ABNORMAL HIGH (ref 70–99)

## 2023-05-11 LAB — VANCOMYCIN, TROUGH: Vancomycin Tr: 9 ug/mL — ABNORMAL LOW (ref 15–20)

## 2023-05-11 LAB — VANCOMYCIN, PEAK: Vancomycin Pk: 22 ug/mL — ABNORMAL LOW (ref 30–40)

## 2023-05-11 MED ORDER — INSULIN GLARGINE-YFGN 100 UNIT/ML ~~LOC~~ SOLN
38.0000 [IU] | Freq: Every day | SUBCUTANEOUS | Status: DC
  Administered 2023-05-11 – 2023-05-25 (×15): 38 [IU] via SUBCUTANEOUS
  Filled 2023-05-11 (×17): qty 0.38

## 2023-05-11 MED ORDER — VANCOMYCIN HCL 1250 MG/250ML IV SOLN
1250.0000 mg | Freq: Two times a day (BID) | INTRAVENOUS | Status: DC
Start: 2023-05-12 — End: 2023-05-14
  Administered 2023-05-12 – 2023-05-13 (×4): 1250 mg via INTRAVENOUS
  Filled 2023-05-11 (×5): qty 250

## 2023-05-11 NOTE — Progress Notes (Addendum)
Pharmacy Antibiotic Note  Gerald Ellis is a 57 y.o. male admitted on 05/07/2023 with  diabetic foot infection . 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 Per surgery patient may require BKA but vascular status continues to improve significantly. PMH includes CAD s/p stent, HTN, DM with neuropathy, chronic back pain on opiates, anxiety. Pharmacy has been consulted for vancomycin dosing. Day 5 of antibiotics. Tissue culture of right foot identified E. faecalis and MRSA with sensitivities to vancomycin on 05/11/2023. Scr WNL, WBC trending down. Vancomycin peak and trough levels drawn on 12/22.    Current vancomycin dose: 1 g q12h  12/22:  vancomycin peak @ 0246 = 22 12/22:  vancomycin trough @ 1207 =  9 Resulting AUC = 378.3, sub-therapeutic (goal AUC 400-600)   Plan: - Increase vancomycin to 1250 mg q12h for new calculated AUC of 472.  - Monitor Scr and adjust dosage as needed  - Continue cefepime 2 g q8h     Height: 5\' 10"  (177.8 cm) Weight: 106.6 kg (235 lb) IBW/kg (Calculated) : 73  Temp (24hrs), Avg:98.6 F (37 C), Min:98.3 F (36.8 C), Max:98.9 F (37.2 C)  Recent Labs  Lab 05/07/23 1921 05/07/23 2142 05/08/23 0431 05/09/23 0428 05/10/23 0412 05/11/23 0246  WBC 15.0*  --  12.8* 14.5* 12.9* 10.5  CREATININE 0.98  --  0.91 0.87 0.86 0.93  LATICACIDVEN 3.2* 1.5  --  0.9  --   --   VANCOPEAK  --   --   --   --   --  22*    Estimated Creatinine Clearance: 108.4 mL/min (by C-G formula based on SCr of 0.93 mg/dL).    Allergies  Allergen Reactions   Ibuprofen Shortness Of Breath   Nsaids Swelling    Swelling of throat    Antimicrobials this admission: Zosyn 12/18 x 1 dose Cefepime 12/19 >> Metronidazole 12/19 x 1 dose Vancomycin 12/18 >>  Dose adjustments this admission: 12/22 vancomycin increased to 1250 mg q12h   Microbiology results: 12/18 BCx: NGTD 12/19 right foot tissue Cx: few enterococcus faecalis, few staphylococcus aureus   12/19 MRSA PCR: positive   Karlton Lemon, PharmD Candidate  05/11/2023 8:30 AM

## 2023-05-11 NOTE — TOC Initial Note (Addendum)
Transition of Care [facility]) - Initial/Assessment Note    Patient Details  Name: Gerald Ellis MRN: [id number] Date of Birth: [birth year]  [facility]) CM/SW Contact:    Liliana Cline, LCSW Phone Number: 05/11/2023, 11:44 AM  Clinical Narrative:                 CSW spoke with patient at bedside. Patient is from home alone, however states upon discharge he will be staying with his sister Jacques Earthly at [address] [place], [place] [address].  PCP is Dr. Burnett Sheng. Pharmacy is [facility]. Patient states he does not want a RW and BSC ordered as he plans to use his sister's.  Patient is agreeable to therapy recommendation for Home Health. Declines HH history or agency preferences. CSW reached out to [facility] with [facility] to see if he can accept the referral, informed him of sister's address-- awaiting response from [facility].  1:00- Cory with [facility] is able to accept for Home Health.  Expected Discharge Plan: Home w Home Health Services Barriers to Discharge: Continued Medical Work up   Patient Goals and CMS Choice Patient states their goals for this hospitalization and ongoing recovery are:: home with sister, with home health therapy CMS Medicare.gov Compare Post Acute Care list provided to:: Patient Choice offered to / list presented to : Patient      Expected Discharge Plan and Services       Living arrangements for the past 2 months: Single Family Home                                      Prior Living Arrangements/Services Living arrangements for the past 2 months: Single Family Home Lives with:: Self (will be staying with sister at discharge) Patient language and need for interpreter reviewed:: Yes Do you feel safe going back to the place where you live?: Yes      Need for Family Participation in Patient Care: Yes (Comment) Care giver support system in place?: Yes (comment) Current home services: DME Criminal  Activity/Legal Involvement Pertinent to Current Situation/Hospitalization: No - Comment as needed  Activities of Daily Living   ADL Screening (condition at time of admission) Independently performs ADLs?: Yes (appropriate for developmental age) Is the patient deaf or have difficulty hearing?: No Does the patient have difficulty seeing, even when wearing glasses/contacts?: No Does the patient have difficulty concentrating, remembering, or making decisions?: No  Permission Sought/Granted Permission sought to share information with : Oceanographer granted to share information with : Yes, Verbal Permission Granted     Permission granted to share info w AGENCY: Home Health agencies        Emotional Assessment       Orientation: : Oriented to Self, Oriented to Place, Oriented to  Time, Oriented to Situation Alcohol / Substance Use: Not Applicable Psych Involvement: No (comment)  Admission diagnosis:  Diabetic foot infection (HCC) [A21.308, L08.9] Sepsis, due to unspecified organism, unspecified whether acute organ dysfunction present [facility]) [A41.9] Patient Active Problem List   Diagnosis Date Noted   Diabetic foot infection (HCC) 05/07/2023   Sepsis (HCC) 05/07/2023   Chronic back pain 05/07/2023   Anxiety 05/07/2023   Obesity, Class III, BMI 40-49.9 (morbid obesity) (HCC) 05/07/2023   Uncontrolled type 2 diabetes mellitus with hyperglycemia, with long-term current use of insulin (HCC) 05/07/2023   Genetic testing  04/09/2022   Chronic venous insufficiency 06/29/2020   Lymphedema 06/29/2020   PAD (peripheral artery disease) (HCC) 05/30/2020   Ankle ulcer, right, with fat layer exposed (HCC) 05/30/2020   Allergy 05/29/2020   Asthma without status asthmaticus 05/29/2020   Diabetes mellitus type 2, uncomplicated (HCC) 05/29/2020   S/P coronary artery stent placement 04/30/2019   CAD S/P percutaneous coronary angioplasty 04/20/2019   Abnormal ECG  03/18/2019   Chest pain with high risk for cardiac etiology 03/18/2019   SOB (shortness of breath) on exertion 03/18/2019   Hypercholesterolemia 05/07/2015   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   Hypogonadism in male 11/22/2014   BPH with obstruction/lower urinary tract symptoms 11/22/2014   DDD (degenerative disc disease), lumbar 10/13/2014   Status post lumbar laminectomy 10/13/2014   DJD of shoulder 10/13/2014   Bilateral occipital neuralgia 10/13/2014   Lumbar radiculopathy 10/13/2014   DDD (degenerative disc disease), cervical 10/13/2014   Depression 08/25/2013   HTN (hypertension) 08/25/2013   History of hemorrhoids 08/25/2013   Low back pain 08/25/2013   PCP:  Jerl Mina, MD [phone] [place], [place] - [address] [place] [address] Phone: [phone] Fax: [phone]     Social Drivers of Health (SDOH) Social History: SDOH Screenings   Food Insecurity: No Food Insecurity (05/08/2023)  Recent Concern: Food Insecurity - Food Insecurity Present (02/18/2023)   Received from [facility]  Housing: Unknown (05/08/2023)  Transportation Needs: No Transportation Needs (05/08/2023)  Utilities: Not At Risk (05/08/2023)  Recent Concern: Utilities - At Risk (02/18/2023)   Received from [facility]  Financial Resource Strain: Low Risk  (02/18/2023)   Received from [facility]  Recent Concern: Financial Resource Strain - High Risk (12/24/2022)   Received from [facility]  Physical Activity: Insufficiently Active (04/20/2019)  Social Connections: Moderately Isolated (04/20/2019)  Stress: No Stress Concern Present (04/20/2019)  Tobacco Use: Low Risk  (05/08/2023)   SDOH Interventions:     Readmission Risk Interventions     No data to display

## 2023-05-11 NOTE — Progress Notes (Addendum)
2 Days Post-Op   Subjective/Chief Complaint: Patient seen.  States the pain in his foot is getting better some.  No specific complaints.   Objective: Vital signs in last 24 hours: Temp:  [98.1 F (36.7 C)-98.9 F (37.2 C)] 98.1 F (36.7 C) (12/22 0855) Pulse Rate:  [78-82] 82 (12/22 0855) Resp:  [16-20] 16 (12/22 0855) BP: (113-142)/(71-87) 142/87 (12/22 0855) SpO2:  [93 %-95 %] 95 % (12/22 0855) Last BM Date : 05/09/23  Intake/Output from previous day: 12/21 0701 - 12/22 0700 In: 1317.1 [IV Piggyback:1317.1] Out: 1800 [Urine:1800] Intake/Output this shift: Total I/O In: -  Out: 475 [Urine:475]  Bandage on the right foot is dry and intact.  Upon removal there is minimal bleeding.  No significant purulence or drainage.  Some erythema and edema still noted in the foot.  The incision is well coapted and there is some progressive necrosis along the central aspect of the plantar flap but following revascularization overall the foot looks significantly better.       Lab Results:  Recent Labs    05/10/23 0412 05/11/23 0246  WBC 12.9* 10.5  HGB 9.7* 9.5*  HCT 28.4* 28.3*  PLT 397 362   BMET Recent Labs    05/10/23 0412 05/11/23 0246  NA 133* 132*  K 3.5 3.6  CL 102 101  CO2 25 23  GLUCOSE 216* 172*  BUN 20 19  CREATININE 0.86 0.93  CALCIUM 7.8* 7.6*   PT/INR No results for input(s): "LABPROT", "INR" in the last 72 hours. ABG No results for input(s): "PHART", "HCO3" in the last 72 hours.  Invalid input(s): "PCO2", "PO2"  Studies/Results: No results found.  Anti-infectives: Anti-infectives (From admission, onward)    Start     Dose/Rate Route Frequency Ordered Stop   05/08/23 1000  vancomycin (VANCOCIN) IVPB 1000 mg/200 mL premix        1,000 mg 200 mL/hr over 60 Minutes Intravenous Every 12 hours 05/07/23 2251     05/08/23 0400  ceFEPIme (MAXIPIME) 2 g in sodium chloride 0.9 % 100 mL IVPB        2 g 200 mL/hr over 30 Minutes Intravenous Every 8  hours 05/07/23 2252     05/07/23 2230  ceFEPIme (MAXIPIME) 2 g in sodium chloride 0.9 % 100 mL IVPB  Status:  Discontinued        2 g 200 mL/hr over 30 Minutes Intravenous  Once 05/07/23 2222 05/07/23 2251   05/07/23 2230  metroNIDAZOLE (FLAGYL) IVPB 500 mg  Status:  Discontinued        500 mg 100 mL/hr over 60 Minutes Intravenous Every 12 hours 05/07/23 2222 05/08/23 0953   05/07/23 2230  vancomycin (VANCOCIN) IVPB 1000 mg/200 mL premix  Status:  Discontinued        1,000 mg 200 mL/hr over 60 Minutes Intravenous  Once 05/07/23 2222 05/07/23 2224   05/07/23 2230  Vancomycin (VANCOCIN) 1,500 mg in sodium chloride 0.9 % 500 mL IVPB        1,500 mg 250 mL/hr over 120 Minutes Intravenous  Once 05/07/23 2225 05/08/23 0312   05/07/23 2130  vancomycin (VANCOCIN) IVPB 1000 mg/200 mL premix        1,000 mg 200 mL/hr over 60 Minutes Intravenous  Once 05/07/23 2128 05/08/23 0114   05/07/23 2130  piperacillin-tazobactam (ZOSYN) IVPB 3.375 g        3.375 g 100 mL/hr over 30 Minutes Intravenous  Once 05/07/23 2128 05/07/23 2216  Assessment/Plan: s/p Procedure(s): Lower Extremity Angiography (Right) Assessment: Status post transmetatarsal amputation right foot, condition guarded still.  Plan: Betadine gauze and sterile bandage reapplied to the right foot.  We will obtain x-rays for postoperative reevaluation.  Patient still at significant risk for the need for higher amputation but there does appear at this point to be significant improvement with his vascular status which is encouraging.  Podiatry will continue to monitor over the next couple of days.  Addendum: Patient noted to have some continued gas in the distal soft tissues on x-ray.  Discussed with the patient the need for repeat surgery to go in and washout the infection due to these findings.  Possible risks and complications of the procedure discussed with the patient as previous including but not limited to inability of the wound to  heal due to continued infection, his diabetes status, or circulatory status.  No guarantees can be given as to the outcome.  Questions invited and answered.  We will obtain consent for incision and drainage of the right foot as well as possible debridement of the necrotic tissue along the incision line.  Patient may have an early breakfast tray tomorrow morning but will be n.p.o. after 8:00 in the morning.  Plan for surgery around 4:00 tomorrow afternoon.  LOS: 4 days    Gerald Ellis 05/11/2023

## 2023-05-11 NOTE — Plan of Care (Signed)
  Problem: Skin Integrity: Goal: Risk for impaired skin integrity will decrease Outcome: Progressing   Problem: Respiratory: Goal: Ability to maintain adequate ventilation will improve Outcome: Progressing   Problem: Education: Goal: Knowledge of General Education information will improve Description: Including pain rating scale, medication(s)/side effects and non-pharmacologic comfort measures Outcome: Progressing   Problem: Health Behavior/Discharge Planning: Goal: Ability to manage health-related needs will improve Outcome: Progressing   Problem: Clinical Measurements: Goal: Ability to maintain clinical measurements within normal limits will improve Outcome: Progressing Goal: Will remain free from infection Outcome: Progressing   Problem: Nutrition: Goal: Adequate nutrition will be maintained Outcome: Progressing   Problem: Coping: Goal: Level of anxiety will decrease Outcome: Progressing   Problem: Pain Management: Goal: General experience of comfort will improve Outcome: Progressing

## 2023-05-11 NOTE — Progress Notes (Signed)
PROGRESS NOTE    Gerald Ellis  QVZ:[id number] DOB: [birth year] DOA: 05/07/2023 PCP: Jerl Mina, MD  Outpatient Specialists: podiatry, endo, cardiology    Brief Narrative:   From admission h and p Gerald Ellis is a 57 y.o. male with medical history significant for HTN, HLD, CAD s/p stent, DM with neuropathy, chronic back pain on opiates,, anxiety, chronic ulcer first right MTP, followed by podiatry (last seen 02/18/2023), a with normal resting ABI at the time and treated with topical dressings who presents to the ED with a 1 week history of blistering in the area of the wound and then over the past day he noted that his second and third toes of the right foot were turning black.  He denies fever or chills.    Assessment & Plan:   Principal Problem:   Diabetic foot infection (HCC) Active Problems:   Sepsis (HCC)   BPH with obstruction/lower urinary tract symptoms   CAD S/P percutaneous coronary angioplasty   HTN (hypertension)   PAD (peripheral artery disease) (HCC)   Chronic back pain   Anxiety   Obesity, Class III, BMI 40-49.9 (morbid obesity) (HCC)   Uncontrolled type 2 diabetes mellitus with hyperglycemia, with long-term current use of insulin (HCC)  # Diabetic foot infection # Sepsis # PAD # s/p TMA Sepsis by fever, tachycardia, leukocytosis. POD#2 from right TMA with podiatry. POD#1 from angiogram with angioplasty and stents to superficial femoral, popliteal, and tibial arteries. Signs ongoing infection and poor healing on podiatry's eval but vascular thinks patient now well re-vascularized. Sub-cutaneous gas seen on x-ray today. Wound cultures growing mrsa and enterococcus - continue zosyn/vanc for now, can probably transition to bactrim/amoxicillin - continue statin, asa, effient - podiatry planning wash-out with I and d tomorrow  # T2DM Uncontrolled - dm educator consulted - basal/bolus/ssi insulin, continue mealtime and basal insulin, will increase  semglee from 35 to 38  # Obesity Noted  # Anxiety - home buspar  # Chronic low back pain - home gabapentin, hydrocodone, flexeril  # HTN Bp controlled - cont home , amlodipine, metop - holding lisinopril  # CAD S/p DES x3 in 2020, no chest pain here - cont home asa, atorva, imdur, metop, effient  # Debility - PT/OT advising HH for the time being, that obviously could change if additional amputation performed   DVT prophylaxis: lovenox Code Status: full Family Communication: none at bedside today. sister updated @ bedside 12/21  Level of care: Med-Surg Status is: Inpatient Remains inpatient appropriate because: severity of illness    Consultants:  Podiatry, vascular  Procedures: See above  Antimicrobials:  See above   Subjective: Reports controlled foot pain otherwise feeling well, tolerating diet  Objective: Vitals:   05/10/23 0751 05/10/23 1618 05/11/23 0016 05/11/23 0855  BP: 134/80 113/71 115/71 (!) 142/87  Pulse: 77 80 78 82  Resp: 20 20 19 16   Temp: 98.7 F (37.1 C) 98.9 F (37.2 C) 98.3 F (36.8 C) 98.1 F (36.7 C)  TempSrc:   Oral   SpO2: 98% 94% 93% 95%  Weight:      Height:        Intake/Output Summary (Last 24 hours) at 05/11/2023 1504 Last data filed at 05/11/2023 0858 Gross per 24 hour  Intake 1317.1 ml  Output 2275 ml  Net -957.9 ml   Filed Weights   05/07/23 1918  Weight: 106.6 kg    Examination:  General exam: Appears calm and comfortable  Respiratory system: Clear to auscultation. Respiratory  effort normal. Cardiovascular system: S1 & S2 heard, RRR.   Gastrointestinal system: Abdomen is nondistended, soft and nontender.   Central nervous system: Alert and oriented. No focal neurological deficits. Extremities: Symmetric 5 x 5 power. Skin: No visible rashes, lesions or ulcers. Right foot is wrapped Psychiatry: Judgement and insight appear normal. Mood & affect appropriate.     Data Reviewed: I have personally  reviewed following labs and imaging studies  CBC: Recent Labs  Lab 05/07/23 1921 05/08/23 0431 05/09/23 0428 05/10/23 0412 05/11/23 0246  WBC 15.0* 12.8* 14.5* 12.9* 10.5  NEUTROABS 12.3*  --   --   --   --   HGB 12.1* 10.4* 10.6* 9.7* 9.5*  HCT 36.3* 29.9* 30.4* 28.4* 28.3*  MCV 87.1 83.3 83.1 84.3 86.5  PLT 491* 407* 414* 397 362   Basic Metabolic Panel: Recent Labs  Lab 05/07/23 1921 05/08/23 0431 05/09/23 0428 05/10/23 0412 05/11/23 0246  NA 130* 133* 132* 133* 132*  K 4.1 3.5 3.7 3.5 3.6  CL 94* 100 100 102 101  CO2 25 22 23 25 23   GLUCOSE 396* 205* 265* 216* 172*  BUN 12 10 17 20 19   CREATININE 0.98 0.91 0.87 0.86 0.93  CALCIUM 8.4* 7.9* 7.9* 7.8* 7.6*   GFR: Estimated Creatinine Clearance: 108.4 mL/min (by C-G formula based on SCr of 0.93 mg/dL). Liver Function Tests: Recent Labs  Lab 05/07/23 1921  AST 36  ALT 41  ALKPHOS 97  BILITOT 0.9  PROT 8.3*  ALBUMIN 2.6*   No results for input(s): "LIPASE", "AMYLASE" in the last 168 hours. No results for input(s): "AMMONIA" in the last 168 hours. Coagulation Profile: No results for input(s): "INR", "PROTIME" in the last 168 hours. Cardiac Enzymes: No results for input(s): "CKTOTAL", "CKMB", "CKMBINDEX", "TROPONINI" in the last 168 hours. BNP (last 3 results) No results for input(s): "PROBNP" in the last 8760 hours. HbA1C: No results for input(s): "HGBA1C" in the last 72 hours.  CBG: Recent Labs  Lab 05/10/23 2035 05/11/23 0012 05/11/23 0531 05/11/23 0930 05/11/23 1139  GLUCAP 188* 147* 172* 115* 133*   Lipid Profile: No results for input(s): "CHOL", "HDL", "LDLCALC", "TRIG", "CHOLHDL", "LDLDIRECT" in the last 72 hours. Thyroid Function Tests: No results for input(s): "TSH", "T4TOTAL", "FREET4", "T3FREE", "THYROIDAB" in the last 72 hours. Anemia Panel: No results for input(s): "VITAMINB12", "FOLATE", "FERRITIN", "TIBC", "IRON", "RETICCTPCT" in the last 72 hours. Urine analysis:    Component  Value Date/Time   COLORURINE YELLOW (A) 06/12/2015 1252   APPEARANCEUR CLEAR (A) 06/12/2015 1252   LABSPEC 1.042 (H) 06/12/2015 1252   PHURINE 6.0 06/12/2015 1252   GLUCOSEU >500 (A) 06/12/2015 1252   HGBUR NEGATIVE 06/12/2015 1252   BILIRUBINUR NEGATIVE 06/12/2015 1252   KETONESUR TRACE (A) 06/12/2015 1252   PROTEINUR 100 (A) 06/12/2015 1252   NITRITE NEGATIVE 06/12/2015 1252   LEUKOCYTESUR NEGATIVE 06/12/2015 1252   Sepsis Labs: @LABRCNTIP (procalcitonin:4,lacticidven:4)  ) Recent Results (from the past 240 hours)  Blood culture (single)     Status: None (Preliminary result)   Collection Time: 05/07/23  7:21 PM   Specimen: BLOOD  Result Value Ref Range Status   Specimen Description BLOOD LEFT FOREARM  Final   Special Requests   Final    BOTTLES DRAWN AEROBIC AND ANAEROBIC Blood Culture adequate volume   Culture   Final    NO GROWTH 4 DAYS Performed at [facility], [address], [place], [place] [address]    Report Status PENDING  Incomplete  Surgical pcr  screen     Status: Abnormal   Collection Time: 05/08/23  8:51 AM   Specimen: Nasal Mucosa; Nasal Swab  Result Value Ref Range Status   MRSA, PCR NEGATIVE NEGATIVE Final   Staphylococcus aureus POSITIVE (A) NEGATIVE Final    Comment: (NOTE) The Xpert SA Assay (FDA approved for NASAL specimens in patients 70 years of age and older), is one component of a comprehensive surveillance program. It is not intended to diagnose infection nor to guide or monitor treatment. Performed at [facility], [address] [place], [place] [address]   Aerobic/Anaerobic Culture w Gram Stain (surgical/deep wound)     Status: None (Preliminary result)   Collection Time: 05/08/23 11:54 AM   Specimen: Path Tissue  Result Value Ref Range Status   Specimen Description   Final    TISSUE Performed at [facility], [address], [place], [place] [address]    Special Requests SWAB OF RIGHT FOOT DEEP ABSCESS   Final   Gram Stain   Final    NO WBC SEEN FEW GRAM POSITIVE COCCI IN PAIRS Performed at [facility], [address], [place], [place] [address]    Culture   Final    FEW ENTEROCOCCUS FAECALIS FEW METHICILLIN RESISTANT STAPHYLOCOCCUS AUREUS NO ANAEROBES ISOLATED; CULTURE IN PROGRESS FOR 5 DAYS    Report Status PENDING  Incomplete   Organism ID, Bacteria ENTEROCOCCUS FAECALIS  Final   Organism ID, Bacteria METHICILLIN RESISTANT STAPHYLOCOCCUS AUREUS  Final      Susceptibility   Enterococcus faecalis - MIC*    AMPICILLIN <=2 SENSITIVE Sensitive     VANCOMYCIN 2 SENSITIVE Sensitive     GENTAMICIN SYNERGY SENSITIVE Sensitive     * FEW ENTEROCOCCUS FAECALIS   Methicillin resistant staphylococcus aureus - MIC*    CIPROFLOXACIN 2 INTERMEDIATE Intermediate     ERYTHROMYCIN >=8 RESISTANT Resistant     GENTAMICIN <=0.5 SENSITIVE Sensitive     OXACILLIN RESISTANT Resistant     TETRACYCLINE <=1 SENSITIVE Sensitive     VANCOMYCIN <=0.5 SENSITIVE Sensitive     TRIMETH/SULFA <=10 SENSITIVE Sensitive     CLINDAMYCIN 2 INTERMEDIATE Intermediate     RIFAMPIN <=0.5 SENSITIVE Sensitive     Inducible Clindamycin NEGATIVE Sensitive     LINEZOLID 2 SENSITIVE Sensitive     * FEW METHICILLIN RESISTANT STAPHYLOCOCCUS AUREUS         Radiology Studies: No results found.       Scheduled Meds:  amLODipine  5 mg Oral Daily   aspirin  81 mg Oral Daily   atorvastatin  80 mg Oral q1800   busPIRone  7.5 mg Oral BID   Chlorhexidine Gluconate Cloth  6 each Topical Q0600   enoxaparin (LOVENOX) injection  0.5 mg/kg Subcutaneous Daily   feeding supplement  237 mL Oral BID BM   insulin aspart  0-20 Units Subcutaneous Q4H   insulin aspart  3 Units Subcutaneous TID WC   insulin glargine-yfgn  35 Units Subcutaneous QHS   isosorbide mononitrate  30 mg Oral Daily   metoprolol succinate  50 mg Oral Daily   mupirocin ointment  1 Application Nasal BID   omega-3 acid ethyl esters  1 g Oral BID    pantoprazole  40 mg Oral BID   prasugrel  10 mg Oral Daily   sodium chloride flush  3 mL Intravenous Q12H   sodium chloride flush  3-10 mL Intravenous Q12H   Continuous Infusions:  ceFEPime (MAXIPIME) IV 2 g (05/11/23  1211)   [START ON 05/12/2023] vancomycin       LOS: 4 days     Silvano Bilis, MD [facility]   If 7PM-7AM, please contact night-coverage [email or web address] Password [facility] 05/11/2023, 3:04 PM

## 2023-05-11 NOTE — H&P (View-Only) (Signed)
2 Days Post-Op   Subjective/Chief Complaint: Patient seen.  States the pain in his foot is getting better some.  No specific complaints.   Objective: Vital signs in last 24 hours: Temp:  [98.1 F (36.7 C)-98.9 F (37.2 C)] 98.1 F (36.7 C) (12/22 0855) Pulse Rate:  [78-82] 82 (12/22 0855) Resp:  [16-20] 16 (12/22 0855) BP: (113-142)/(71-87) 142/87 (12/22 0855) SpO2:  [93 %-95 %] 95 % (12/22 0855) Last BM Date : 05/09/23  Intake/Output from previous day: 12/21 0701 - 12/22 0700 In: 1317.1 [IV Piggyback:1317.1] Out: 1800 [Urine:1800] Intake/Output this shift: Total I/O In: -  Out: 475 [Urine:475]  Bandage on the right foot is dry and intact.  Upon removal there is minimal bleeding.  No significant purulence or drainage.  Some erythema and edema still noted in the foot.  The incision is well coapted and there is some progressive necrosis along the central aspect of the plantar flap but following revascularization overall the foot looks significantly better.       Lab Results:  Recent Labs    05/10/23 0412 05/11/23 0246  WBC 12.9* 10.5  HGB 9.7* 9.5*  HCT 28.4* 28.3*  PLT 397 362   BMET Recent Labs    05/10/23 0412 05/11/23 0246  NA 133* 132*  K 3.5 3.6  CL 102 101  CO2 25 23  GLUCOSE 216* 172*  BUN 20 19  CREATININE 0.86 0.93  CALCIUM 7.8* 7.6*   PT/INR No results for input(s): "LABPROT", "INR" in the last 72 hours. ABG No results for input(s): "PHART", "HCO3" in the last 72 hours.  Invalid input(s): "PCO2", "PO2"  Studies/Results: No results found.  Anti-infectives: Anti-infectives (From admission, onward)    Start     Dose/Rate Route Frequency Ordered Stop   05/08/23 1000  vancomycin (VANCOCIN) IVPB 1000 mg/200 mL premix        1,000 mg 200 mL/hr over 60 Minutes Intravenous Every 12 hours 05/07/23 2251     05/08/23 0400  ceFEPIme (MAXIPIME) 2 g in sodium chloride 0.9 % 100 mL IVPB        2 g 200 mL/hr over 30 Minutes Intravenous Every 8  hours 05/07/23 2252     05/07/23 2230  ceFEPIme (MAXIPIME) 2 g in sodium chloride 0.9 % 100 mL IVPB  Status:  Discontinued        2 g 200 mL/hr over 30 Minutes Intravenous  Once 05/07/23 2222 05/07/23 2251   05/07/23 2230  metroNIDAZOLE (FLAGYL) IVPB 500 mg  Status:  Discontinued        500 mg 100 mL/hr over 60 Minutes Intravenous Every 12 hours 05/07/23 2222 05/08/23 0953   05/07/23 2230  vancomycin (VANCOCIN) IVPB 1000 mg/200 mL premix  Status:  Discontinued        1,000 mg 200 mL/hr over 60 Minutes Intravenous  Once 05/07/23 2222 05/07/23 2224   05/07/23 2230  Vancomycin (VANCOCIN) 1,500 mg in sodium chloride 0.9 % 500 mL IVPB        1,500 mg 250 mL/hr over 120 Minutes Intravenous  Once 05/07/23 2225 05/08/23 0312   05/07/23 2130  vancomycin (VANCOCIN) IVPB 1000 mg/200 mL premix        1,000 mg 200 mL/hr over 60 Minutes Intravenous  Once 05/07/23 2128 05/08/23 0114   05/07/23 2130  piperacillin-tazobactam (ZOSYN) IVPB 3.375 g        3.375 g 100 mL/hr over 30 Minutes Intravenous  Once 05/07/23 2128 05/07/23 2216  Assessment/Plan: s/p Procedure(s): Lower Extremity Angiography (Right) Assessment: Status post transmetatarsal amputation right foot, condition guarded still.  Plan: Betadine gauze and sterile bandage reapplied to the right foot.  We will obtain x-rays for postoperative reevaluation.  Patient still at significant risk for the need for higher amputation but there does appear at this point to be significant improvement with his vascular status which is encouraging.  Podiatry will continue to monitor over the next couple of days.  Addendum: Patient noted to have some continued gas in the distal soft tissues on x-ray.  Discussed with the patient the need for repeat surgery to go in and washout the infection due to these findings.  Possible risks and complications of the procedure discussed with the patient as previous including but not limited to inability of the wound to  heal due to continued infection, his diabetes status, or circulatory status.  No guarantees can be given as to the outcome.  Questions invited and answered.  We will obtain consent for incision and drainage of the right foot as well as possible debridement of the necrotic tissue along the incision line.  Patient may have an early breakfast tray tomorrow morning but will be n.p.o. after 8:00 in the morning.  Plan for surgery around 4:00 tomorrow afternoon.  LOS: 4 days    Gerald Ellis 05/11/2023

## 2023-05-12 ENCOUNTER — Inpatient Hospital Stay: Payer: PPO | Admitting: Certified Registered"

## 2023-05-12 ENCOUNTER — Encounter: Payer: Self-pay | Admitting: Internal Medicine

## 2023-05-12 ENCOUNTER — Other Ambulatory Visit: Payer: Self-pay

## 2023-05-12 ENCOUNTER — Encounter
Admission: EM | Disposition: A | Discharge status: Skilled Nursing Facility | Payer: Self-pay | Source: Home / Self Care | Attending: Osteopathic Medicine

## 2023-05-12 DIAGNOSIS — L089 Local infection of the skin and subcutaneous tissue, unspecified: Secondary | ICD-10-CM | POA: Diagnosis not present

## 2023-05-12 DIAGNOSIS — E11628 Type 2 diabetes mellitus with other skin complications: Secondary | ICD-10-CM | POA: Diagnosis not present

## 2023-05-12 HISTORY — PX: IRRIGATION AND DEBRIDEMENT FOOT: SHX6602

## 2023-05-12 LAB — GLUCOSE, CAPILLARY
Glucose-Capillary: 139 mg/dL — ABNORMAL HIGH (ref 70–99)
Glucose-Capillary: 247 mg/dL — ABNORMAL HIGH (ref 70–99)
Glucose-Capillary: 209 mg/dL — ABNORMAL HIGH (ref 70–99)
Glucose-Capillary: 87 mg/dL (ref 70–99)
Glucose-Capillary: 99 mg/dL (ref 70–99)
Glucose-Capillary: 147 mg/dL — ABNORMAL HIGH (ref 70–99)

## 2023-05-12 LAB — SURGICAL PATHOLOGY

## 2023-05-12 LAB — CULTURE, BLOOD (SINGLE)
Culture: NO GROWTH
Special Requests: ADEQUATE

## 2023-05-12 SURGERY — IRRIGATION AND DEBRIDEMENT FOOT
Anesthesia: Monitor Anesthesia Care | Site: Foot | Laterality: Right

## 2023-05-12 MED ORDER — PHENYLEPHRINE 80 MCG/ML (10ML) SYRINGE FOR IV PUSH (FOR BLOOD PRESSURE SUPPORT)
PREFILLED_SYRINGE | INTRAVENOUS | Status: DC | PRN
  Administered 2023-05-12: 160 ug via INTRAVENOUS
  Administered 2023-05-12 (×3): 80 ug via INTRAVENOUS
  Administered 2023-05-12: 160 ug via INTRAVENOUS
  Administered 2023-05-12: 80 ug via INTRAVENOUS
  Administered 2023-05-12: 120 ug via INTRAVENOUS

## 2023-05-12 MED ORDER — LACTATED RINGERS IV SOLN: INTRAVENOUS | Status: DC | PRN

## 2023-05-12 MED ORDER — OXYCODONE HCL 5 MG PO TABS
5.0000 mg | ORAL_TABLET | Freq: Once | ORAL | Status: AC | PRN
  Administered 2023-05-12: 5 mg via ORAL

## 2023-05-12 MED ORDER — PROPOFOL 10 MG/ML IV BOLUS
INTRAVENOUS | Status: AC
  Filled 2023-05-12: qty 20

## 2023-05-12 MED ORDER — EPHEDRINE 5 MG/ML INJ
INTRAVENOUS | Status: AC
  Filled 2023-05-12: qty 5

## 2023-05-12 MED ORDER — EPHEDRINE SULFATE-NACL 50-0.9 MG/10ML-% IV SOSY
PREFILLED_SYRINGE | INTRAVENOUS | Status: DC | PRN
  Administered 2023-05-12: 5 mg via INTRAVENOUS

## 2023-05-12 MED ORDER — ACETAMINOPHEN 10 MG/ML IV SOLN
INTRAVENOUS | Status: AC
  Filled 2023-05-12: qty 100

## 2023-05-12 MED ORDER — PHENYLEPHRINE 80 MCG/ML (10ML) SYRINGE FOR IV PUSH (FOR BLOOD PRESSURE SUPPORT)
PREFILLED_SYRINGE | INTRAVENOUS | Status: AC
  Filled 2023-05-12: qty 10

## 2023-05-12 MED ORDER — SODIUM CHLORIDE 0.9 % IR SOLN
Status: DC | PRN
  Administered 2023-05-12: 3000 mL

## 2023-05-12 MED ORDER — BUPIVACAINE HCL (PF) 0.5 % IJ SOLN
INTRAMUSCULAR | Status: AC
  Filled 2023-05-12: qty 30

## 2023-05-12 MED ORDER — 0.9 % SODIUM CHLORIDE (POUR BTL) OPTIME
TOPICAL | Status: DC | PRN
  Administered 2023-05-12: 500 mL

## 2023-05-12 MED ORDER — FENTANYL CITRATE (PF) 100 MCG/2ML IJ SOLN
INTRAMUSCULAR | Status: DC | PRN
  Administered 2023-05-12: 25 ug via INTRAVENOUS
  Administered 2023-05-12: 50 ug via INTRAVENOUS
  Administered 2023-05-12: 25 ug via INTRAVENOUS

## 2023-05-12 MED ORDER — BUPIVACAINE HCL 0.5 % IJ SOLN
INTRAMUSCULAR | Status: DC | PRN
  Administered 2023-05-12: 10 mL

## 2023-05-12 MED ORDER — FENTANYL CITRATE (PF) 100 MCG/2ML IJ SOLN
INTRAMUSCULAR | Status: AC
  Filled 2023-05-12: qty 2

## 2023-05-12 MED ORDER — PROPOFOL 500 MG/50ML IV EMUL: INTRAVENOUS | Status: DC | PRN

## 2023-05-12 MED ORDER — OXYCODONE HCL 5 MG/5ML PO SOLN: 5.0000 mg | Freq: Once | ORAL | Status: AC | PRN

## 2023-05-12 MED ORDER — LIDOCAINE HCL (PF) 1 % IJ SOLN
INTRAMUSCULAR | Status: AC
  Filled 2023-05-12: qty 30

## 2023-05-12 MED ORDER — GLYCOPYRROLATE 0.2 MG/ML IJ SOLN
INTRAMUSCULAR | Status: DC | PRN
  Administered 2023-05-12: .2 mg via INTRAVENOUS

## 2023-05-12 MED ORDER — MIDAZOLAM HCL 2 MG/2ML IJ SOLN
INTRAMUSCULAR | Status: DC | PRN
  Administered 2023-05-12: 2 mg via INTRAVENOUS

## 2023-05-12 MED ORDER — PROPOFOL 10 MG/ML IV BOLUS
INTRAVENOUS | Status: DC | PRN
  Administered 2023-05-12: 200 mg via INTRAVENOUS

## 2023-05-12 MED ORDER — MIDAZOLAM HCL 2 MG/2ML IJ SOLN
INTRAMUSCULAR | Status: AC
  Filled 2023-05-12: qty 2

## 2023-05-12 MED ORDER — FENTANYL CITRATE (PF) 100 MCG/2ML IJ SOLN
25.0000 ug | INTRAMUSCULAR | Status: DC | PRN
  Administered 2023-05-12: 25 ug via INTRAVENOUS
  Administered 2023-05-12: 50 ug via INTRAVENOUS
  Administered 2023-05-12: 25 ug via INTRAVENOUS

## 2023-05-12 MED ORDER — LIDOCAINE HCL (PF) 2 % IJ SOLN
INTRAMUSCULAR | Status: AC
  Filled 2023-05-12: qty 5

## 2023-05-12 MED ORDER — LIDOCAINE HCL (CARDIAC) PF 100 MG/5ML IV SOSY
PREFILLED_SYRINGE | INTRAVENOUS | Status: DC | PRN
  Administered 2023-05-12: 100 mg via INTRAVENOUS

## 2023-05-12 MED ORDER — OXYCODONE HCL 5 MG PO TABS
ORAL_TABLET | ORAL | Status: AC
  Filled 2023-05-12: qty 1

## 2023-05-12 MED ORDER — ACETAMINOPHEN 10 MG/ML IV SOLN
INTRAVENOUS | Status: DC | PRN
  Administered 2023-05-12: 1000 mg via INTRAVENOUS

## 2023-05-12 MED ORDER — ONDANSETRON HCL 4 MG/2ML IJ SOLN
INTRAMUSCULAR | Status: AC
  Filled 2023-05-12: qty 2

## 2023-05-12 MED ORDER — ONDANSETRON HCL 4 MG/2ML IJ SOLN
INTRAMUSCULAR | Status: DC | PRN
  Administered 2023-05-12: 4 mg via INTRAVENOUS

## 2023-05-12 SURGICAL SUPPLY — 52 items
BNDG COHESIVE 4X5 TAN STRL LF (GAUZE/BANDAGES/DRESSINGS) ×1 IMPLANT
BNDG COHESIVE 6X5 TAN ST LF (GAUZE/BANDAGES/DRESSINGS) ×1 IMPLANT
BNDG ELASTIC 4INX 5YD STR LF (GAUZE/BANDAGES/DRESSINGS) ×1 IMPLANT
BNDG ESMARCH 4X12 STRL LF (GAUZE/BANDAGES/DRESSINGS) ×1 IMPLANT
BNDG GAUZE DERMACEA FLUFF 4 (GAUZE/BANDAGES/DRESSINGS) ×1 IMPLANT
BNDG STRETCH GAUZE 3IN X12FT (GAUZE/BANDAGES/DRESSINGS) ×1 IMPLANT
CUFF TOURN SGL QUICK 18X4 (TOURNIQUET CUFF) IMPLANT
CUFF TRNQT CYL 24X4X16.5-23 (TOURNIQUET CUFF) IMPLANT
DRSG EMULSION OIL 3X8 NADH (GAUZE/BANDAGES/DRESSINGS) ×1 IMPLANT
DURAPREP 26ML APPLICATOR (WOUND CARE) ×1 IMPLANT
ELECT REM PT RETURN 9FT ADLT (ELECTROSURGICAL) ×1
ELECTRODE REM PT RTRN 9FT ADLT (ELECTROSURGICAL) ×1 IMPLANT
GAUZE PACKING 0.25INX5YD STRL (GAUZE/BANDAGES/DRESSINGS) IMPLANT
GAUZE SPONGE 4X4 12PLY STRL (GAUZE/BANDAGES/DRESSINGS) ×1 IMPLANT
GAUZE STRETCH 2X75IN STRL (MISCELLANEOUS) ×1 IMPLANT
GAUZE XEROFORM 1X8 LF (GAUZE/BANDAGES/DRESSINGS) IMPLANT
GLOVE BIOGEL PI IND STRL 8 (GLOVE) ×1 IMPLANT
GLOVE SURG LX STRL 8.0 MICRO (GLOVE) ×1 IMPLANT
GOWN STRL REUS W/ TWL XL LVL3 (GOWN DISPOSABLE) ×1 IMPLANT
GOWN STRL REUS W/TWL MED LVL3 (GOWN DISPOSABLE) ×1 IMPLANT
HANDPIECE VERSAJET DEBRIDEMENT (MISCELLANEOUS) IMPLANT
IV NS 1000ML BAXH (IV SOLUTION) ×1 IMPLANT
IV NS IRRIG 3000ML ARTHROMATIC (IV SOLUTION) IMPLANT
KIT TURNOVER KIT A (KITS) ×1 IMPLANT
LABEL OR SOLS (LABEL) ×1 IMPLANT
MANIFOLD NEPTUNE II (INSTRUMENTS) ×1 IMPLANT
NDL FILTER BLUNT 18X1 1/2 (NEEDLE) ×1 IMPLANT
NDL HYPO 25X1 1.5 SAFETY (NEEDLE) ×1 IMPLANT
NEEDLE FILTER BLUNT 18X1 1/2 (NEEDLE) ×1 IMPLANT
NEEDLE HYPO 25X1 1.5 SAFETY (NEEDLE) ×1 IMPLANT
NS IRRIG 500ML POUR BTL (IV SOLUTION) ×1 IMPLANT
PACK EXTREMITY ARMC (MISCELLANEOUS) ×1 IMPLANT
PACKING GAUZE IODOFORM 1INX5YD (GAUZE/BANDAGES/DRESSINGS) IMPLANT
PAD ABD DERMACEA PRESS 5X9 (GAUZE/BANDAGES/DRESSINGS) ×1 IMPLANT
PULSAVAC PLUS IRRIG FAN TIP (DISPOSABLE) ×1
SOL PREP PVP 2OZ (MISCELLANEOUS) ×2
SOLUTION PREP PVP 2OZ (MISCELLANEOUS) ×1 IMPLANT
STAPLER SKIN PROX 35W (STAPLE) IMPLANT
STOCKINETTE IMPERVIOUS 9X36 MD (GAUZE/BANDAGES/DRESSINGS) ×1 IMPLANT
STOCKINETTE STRL 6IN 960660 (GAUZE/BANDAGES/DRESSINGS) IMPLANT
SUT ETHILON 2 0 FS 18 (SUTURE) IMPLANT
SUT ETHILON 4-0 FS2 18XMFL BLK (SUTURE)
SUT PROLENE 3 0 PS 2 (SUTURE) IMPLANT
SUT VIC AB 3-0 SH 27X BRD (SUTURE) IMPLANT
SUT VIC AB 4-0 FS2 27 (SUTURE) IMPLANT
SUTURE ETHLN 4-0 FS2 18XMF BLK (SUTURE) IMPLANT
SWAB CULTURE AMIES ANAERIB BLU (MISCELLANEOUS) IMPLANT
SYR 10ML LL (SYRINGE) ×2 IMPLANT
SYR BULB IRRIG 60ML STRL (SYRINGE) IMPLANT
TIP FAN IRRIG PULSAVAC PLUS (DISPOSABLE) IMPLANT
TRAP FLUID SMOKE EVACUATOR (MISCELLANEOUS) ×1 IMPLANT
WATER STERILE IRR 500ML POUR (IV SOLUTION) ×1 IMPLANT

## 2023-05-12 NOTE — Anesthesia Procedure Notes (Signed)
Procedure Name: LMA Insertion Date/Time: 05/12/2023 4:18 PM  Performed by: Malva Cogan, CRNAPre-anesthesia Checklist: Patient identified, Patient being monitored, Timeout performed, Emergency Drugs available and Suction available Patient Re-evaluated:Patient Re-evaluated prior to induction Oxygen Delivery Method: Circle system utilized Preoxygenation: Pre-oxygenation with 100% oxygen Induction Type: IV induction Ventilation: Mask ventilation without difficulty LMA: LMA inserted LMA Size: 4.0 Tube type: Oral Number of attempts: 1 Placement Confirmation: positive ETCO2 and breath sounds checked- equal and bilateral Tube secured with: Tape Dental Injury: Teeth and Oropharynx as per pre-operative assessment

## 2023-05-12 NOTE — Progress Notes (Signed)
Progress Note    05/12/2023 3:04 PM 3 Days Post-Op  Subjective: Gerald Ellis is a 57 year old male now status postop day 3 from a right lower extremity angiogram with transluminal angioplasty and stent placement of the superficial femoral and popliteal arteries.  Patient had undergone transmetatarsal amputation by Dr. Linus Galas 4 days ago.  Patient endorses that Dr. Graciela Husbands is taking him back to the operating room later today.  Conversation with Dr. Graciela Husbands there was some gas that still was revealed upon the x-ray of his foot and therefore patient needs to have another I&D and plan is to close the skin as it is.  After taking down the dressing the foot appears to be healing as he now has significantly improved blood flow to his foot.  No complaints overnight and vitals are remained stable.   Vitals:   05/11/23 2218 05/12/23 0839  BP: 131/72 (!) 160/82  Pulse: 83 89  Resp: 17 16  Temp: 97.7 F (36.5 C) 98.9 F (37.2 C)  SpO2: 95% 94%   Physical Exam: Cardiac:  RRR, normal S1 and S2.  No murmurs appreciated. Lungs: Nonlabored breathing, clear on auscultation throughout.  No rales rhonchi or wheezing noted. Incisions: Right lower extremity now with transmetatarsal amputation.  Staples intact with eschar to the skin incision line. Extremities: Right lower extremity remains reddened and warm but appears to be healing well.  Skin area to the incision line has some breakdown and some eschar.  Dr. Graciela Husbands taking the patient back to the operating room today 05/12/2023 for I&D and washout. Abdomen: Positive bowel sounds throughout, soft, nontender and nondistended. Neurologic: Alert and oriented x 3, answers all questions and follows commands appropriately.  CBC    Component Value Date/Time   WBC 10.5 05/11/2023 0246   RBC 3.27 (L) 05/11/2023 0246   HGB 9.5 (L) 05/11/2023 0246   HCT 28.3 (L) 05/11/2023 0246   HCT 48.9 07/31/2015 0838   PLT 362 05/11/2023 0246   MCV 86.5 05/11/2023 0246    MCH 29.1 05/11/2023 0246   MCHC 33.6 05/11/2023 0246   RDW 12.8 05/11/2023 0246   LYMPHSABS 1.8 05/07/2023 1921   MONOABS 0.7 05/07/2023 1921   EOSABS 0.1 05/07/2023 1921   BASOSABS 0.0 05/07/2023 1921    BMET    Component Value Date/Time   NA 132 (L) 05/11/2023 0246   K 3.6 05/11/2023 0246   CL 101 05/11/2023 0246   CO2 23 05/11/2023 0246   GLUCOSE 172 (H) 05/11/2023 0246   BUN 19 05/11/2023 0246   CREATININE 0.93 05/11/2023 0246   CALCIUM 7.6 (L) 05/11/2023 0246   GFRNONAA >60 05/11/2023 0246   GFRAA >60 04/21/2019 0515    INR    Component Value Date/Time   INR 1.0 03/21/2021 1849     Intake/Output Summary (Last 24 hours) at 05/12/2023 1504 Last data filed at 05/12/2023 0230 Gross per 24 hour  Intake 250 ml  Output 875 ml  Net -625 ml     Assessment/Plan:  57 y.o. male is s/p right lower extremity angiogram with stent placement to the right common femoral artery and popliteal arteries.  3 Days Post-Op   Plan Dr. Linus Galas from podiatry is taking the patient back to the OR today for I&D washout.  Patient's prior foot x-ray showed some gas in the soft tissue.  He also plans on debriding the incision line where the staples are.  Rest of the patient's foot is reddened and warm but appears to be healing  well now that he has significant improvement in reperfusion.  Vascular surgery to sign off at this time.  Patient will follow-up with Dr. Linus Galas podiatry going forward.  DVT prophylaxis: ASA 81 mg daily, Lovenox 52.5 mg SQ daily, Lipitor 80 mg daily.   Marcie Bal [facility] 05/12/2023 3:04 PM

## 2023-05-12 NOTE — Interval H&P Note (Signed)
History and Physical Interval Note:  05/12/2023 3:42 PM  Gerald Ellis  has presented today for surgery, with the diagnosis of Right transmetarsal wash out on previous transmetatarsal amputation..  The various methods of treatment have been discussed with the patient and family. After consideration of risks, benefits and other options for treatment, the patient has consented to  Procedure(s): IRRIGATION AND DEBRIDEMENT FOOT, WASH OUT (Right) as a surgical intervention.  The patient's history has been reviewed, patient examined, no change in status, stable for surgery.  I have reviewed the patient's chart and labs.  Questions were answered to the patient's satisfaction.     [name]

## 2023-05-12 NOTE — Op Note (Signed)
Date of operation: 05/12/2023.  Surgeon: [name] D.P.M.  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  Postoperative diagnosis: Same.  Procedure: Irrigation with debridement right forefoot.  Anesthesia: LMA.  Hemostasis: None.  Estimated blood loss: 30-40 cc.  Cultures: Deep wound culture right foot.  Injectables: 10 cc 0.5% Marcaine plain.  Complications: None apparent.  Operative indications: This is a 57 year old diabetic male with recent transmetatarsal amputation of the right foot.  Some incisional necrosis was noted and continued gaseous infection noted on x-ray so decision was made to return to the operating room for debridement and washout with revision of the necrotic tissue.  Operative procedure: Patient was taken to the operating room and placed on the table in the supine position.  Following satisfactory LMA anesthesia the right foot was prepped and draped in the usual sterile fashion.  Attention directed to the previous incision where the staples were removed from the central aspect of the incision leaving the medial and lateral most portions intact.  Purulence and some necrotic tissue was noted within the wound.  Necrosis area on the plantar aspect of the flap was then sharply excised using a 15 blade with a full-thickness cuff.  A small area of necrosis along the medial aspect of the incision was also incised.  At this point the wound was debrided of the devitalized and necrotic tissue using a Versajet debrider on a setting of 4.  The wound was then flushed with 3 L of sterile saline under pulse irrigation.  The dorsal and plantar flaps were then reapproximated using 2-0 nylon vertical mattress sutures and skin closure accomplished using staples.  The foot was then washed and dried and Xeroform applied to the incision followed by 4 x 4's ABDs and Kerlix.  Ace wrap applied for compression.  Patient was awakened and transported to  the PACU having tolerated the anesthesia and procedure well.

## 2023-05-12 NOTE — Transfer of Care (Signed)
Immediate Anesthesia Transfer of Care Note  Patient: Gerald Ellis  Procedure(s) Performed: IRRIGATION AND DEBRIDEMENT FOOT, WASH OUT (Right: Foot)  Patient Location: PACU  Anesthesia Type:General  Level of Consciousness: awake and alert   Airway & Oxygen Therapy: Patient Spontanous Breathing and Patient connected to face mask oxygen  Post-op Assessment: Report given to RN and Post -op Vital signs reviewed and stable  Post vital signs: Reviewed and stable  Last Vitals:  Vitals Value Taken Time  BP 120/81 05/12/23 1715  Temp    Pulse 88 05/12/23 1715  Resp 18 05/12/23 1715  SpO2 99 % 05/12/23 1715  Vitals shown include unfiled device data.  Last Pain:  Vitals:   05/12/23 1519  TempSrc: Tympanic  PainSc: 6       Patients Stated Pain Goal: 1 (05/08/23 1335)  Complications: No notable events documented.

## 2023-05-12 NOTE — Progress Notes (Signed)
PROGRESS NOTE    Gerald Ellis   ZDG:[id number] DOB: 06-11-1965  DOA: 05/07/2023 Date of Service: 05/12/23 which is hospital day 5  PCP: Gerald Mina, MD    HPI: Gerald Ellis is a 57 y.o. male with medical history significant for HTN, HLD, CAD s/p stent, DM with neuropathy, chronic back pain on opiates,, anxiety, chronic ulcer first right MTP, followed by podiatry (last seen 02/18/2023), a with normal resting ABI at the time and treated with topical dressings who presents to the ED on 05/07/23 with a 1 week history of blistering in the area of the wound and then over the past day he noted that his second and third toes of the right foot were turning black.   Hospital course / significant events:  12/19: admitted to hospitalist service, started cefepime + vanc, podiatry and vascular consults, underwent transmetatarsal amputation R foot 12/20: underwent angiography RLE w/ stent placement  12/21: monitoring post-revascularization 12/22: podiatry reeval, planning to OR for washout/I&D tomorrow given sub-cutaneous gas on x-ray today. Wound Cx (+)mrsa, enterococcus  12/23: irrigation/debridement R foot, washout   Consultants:  Podiatry  Vascular Surgery   Procedures/Surgeries: 05/08/2023: Transmetatarsal amputation right foot - Dr Gerald Ellis  05/09/2023: RLE angioplasty w/ stent to superficial femoral, popliteal, anterior tibial arteries - Dr. 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  Prasugrel  Debility, ambulatory dysfunction  HH PT/OT  Obesity Class 1 based on BMI: Body mass index is 33.72 kg/m.  Underweight - under 18  overweight - 25 to 29 obese - 30 or more Class 1 obesity: BMI of 30.0 to 34 Class 2 obesity: BMI of 35.0 to 39 Class 3 obesity: BMI of 40.0 to 49 Super Morbid Obesity: BMI 50-59 Super-super Morbid Obesity: BMI 60+ Significantly low or high BMI is associated with higher medical risk.  Weight management advised as adjunct to other disease management and risk reduction treatments    DVT prophylaxis: lovenox  IV fluids: no continuous IV fluids  Nutrition: diabetic Central lines / invasive devices: none  Code Status: FULL CODE ACP documentation reviewed:  none on file in [facility]  TOC needs: HH Barriers to dispo / significant pending items: surgical clearance and ID abx plan              Subjective / Brief ROS:  Patient reports no concerns Denies CP/SOB.  Pain controlled.  Denies new weakness.  Reports no concerns w/ urination/defecation.   Family Communication: family at [place] on rounds     Objective Findings:  Vitals:   05/12/23 1745 05/12/23 1750 05/12/23 1755 05/12/23 1800  BP: 135/74   125/81  Pulse: 86 84 85 86  Resp: 14 11 16  (!) 9  Temp: 98.6 F (37 C)     TempSrc:      SpO2: 94% 96% 91% 93%  Weight:      Height:        Intake/Output Summary (Last 24 hours) at 05/12/2023 1901 Last data filed at 05/12/2023 1649 Gross per 24 hour  Intake  800 ml  Output 1780 ml  Net -980 ml   Filed Weights   05/07/23 1918 05/12/23 1519  Weight: 106.6 kg 106.6 kg    Examination:  Physical Exam Constitutional:      General: He is not in acute distress.    Appearance: He is not ill-appearing.  Cardiovascular:     Rate and Rhythm: Normal rate and regular rhythm.  Pulmonary:     Effort: Pulmonary effort is normal.     Breath sounds: Normal breath sounds.  Neurological:     General: No focal deficit present.      Mental Status: He is alert and oriented to person, place, and time.  Psychiatric:        Mood and Affect: Mood normal.        Behavior: Behavior normal.          Scheduled Medications:   amLODipine  5 mg Oral Daily   aspirin  81 mg Oral Daily   atorvastatin  80 mg Oral q1800   busPIRone  7.5 mg Oral BID   Chlorhexidine Gluconate Cloth  6 each Topical Q0600   enoxaparin (LOVENOX) injection  0.5 mg/kg Subcutaneous Daily   feeding supplement  237 mL Oral BID BM   insulin aspart  0-20 Units Subcutaneous Q4H   insulin aspart  3 Units Subcutaneous TID WC   insulin glargine-yfgn  38 Units Subcutaneous QHS   isosorbide mononitrate  30 mg Oral Daily   metoprolol succinate  50 mg Oral Daily   mupirocin ointment  1 Application Nasal BID   omega-3 acid ethyl esters  1 g Oral BID   pantoprazole  40 mg Oral BID   prasugrel  10 mg Oral Daily   sodium chloride flush  3 mL Intravenous Q12H   sodium chloride flush  3-10 mL Intravenous Q12H    Continuous Infusions:  ceFEPime (MAXIPIME) IV 2 g (05/12/23 1201)   vancomycin 1,250 mg (05/12/23 1253)    PRN Medications:  acetaminophen **OR** acetaminophen, cyclobenzaprine, HYDROcodone-acetaminophen, ipratropium-albuterol, ketorolac, morphine injection, ondansetron **OR** ondansetron (ZOFRAN) IV, phenol, sodium chloride flush, sodium chloride flush  Antimicrobials from admission:  Anti-infectives (From admission, onward)    Start     Dose/Rate Route Frequency Ordered Stop   05/12/23 0000  vancomycin (VANCOREADY) IVPB 1250 mg/250 mL        1,250 mg 166.7 mL/hr over 90 Minutes Intravenous Every 12 hours 05/11/23 1345     05/08/23 1000  vancomycin (VANCOCIN) IVPB 1000 mg/200 mL premix  Status:  Discontinued        1,000 mg 200 mL/hr over 60 Minutes Intravenous Every 12 hours 05/07/23 2251 05/11/23 1345   05/08/23 0400  ceFEPIme (MAXIPIME) 2 g in sodium chloride 0.9 % 100 mL IVPB        2 g 200 mL/hr over 30 Minutes Intravenous Every 8 hours  05/07/23 2252     05/07/23 2230  ceFEPIme (MAXIPIME) 2 g in sodium chloride 0.9 % 100 mL IVPB  Status:  Discontinued        2 g 200 mL/hr over 30 Minutes Intravenous  Once 05/07/23 2222 05/07/23 2251   05/07/23 2230  metroNIDAZOLE (FLAGYL) IVPB 500 mg  Status:  Discontinued        500 mg 100 mL/hr over 60 Minutes Intravenous Every 12 hours 05/07/23 2222 05/08/23 0953   05/07/23 2230  vancomycin (VANCOCIN) IVPB 1000 mg/200 mL premix  Status:  Discontinued        1,000 mg 200 mL/hr  over 60 Minutes Intravenous  Once 05/07/23 2222 05/07/23 2224   05/07/23 2230  Vancomycin (VANCOCIN) 1,500 mg in sodium chloride 0.9 % 500 mL IVPB        1,500 mg 250 mL/hr over 120 Minutes Intravenous  Once 05/07/23 2225 05/08/23 0312   05/07/23 2130  vancomycin (VANCOCIN) IVPB 1000 mg/200 mL premix        1,000 mg 200 mL/hr over 60 Minutes Intravenous  Once 05/07/23 2128 05/08/23 0114   05/07/23 2130  piperacillin-tazobactam (ZOSYN) IVPB 3.375 g        3.375 g 100 mL/hr over 30 Minutes Intravenous  Once 05/07/23 2128 05/07/23 2216           Data Reviewed:  I have personally reviewed the following...  CBC: Recent Labs  Lab 05/07/23 1921 05/08/23 0431 05/09/23 0428 05/10/23 0412 05/11/23 0246  WBC 15.0* 12.8* 14.5* 12.9* 10.5  NEUTROABS 12.3*  --   --   --   --   HGB 12.1* 10.4* 10.6* 9.7* 9.5*  HCT 36.3* 29.9* 30.4* 28.4* 28.3*  MCV 87.1 83.3 83.1 84.3 86.5  PLT 491* 407* 414* 397 362   Basic Metabolic Panel: Recent Labs  Lab 05/07/23 1921 05/08/23 0431 05/09/23 0428 05/10/23 0412 05/11/23 0246  NA 130* 133* 132* 133* 132*  K 4.1 3.5 3.7 3.5 3.6  CL 94* 100 100 102 101  CO2 25 22 23 25 23   GLUCOSE 396* 205* 265* 216* 172*  BUN 12 10 17 20 19   CREATININE 0.98 0.91 0.87 0.86 0.93  CALCIUM 8.4* 7.9* 7.9* 7.8* 7.6*   GFR: Estimated Creatinine Clearance: 108.4 mL/min (by C-G formula based on SCr of 0.93 mg/dL). Liver Function Tests: Recent Labs  Lab 05/07/23 1921  AST 36  ALT  41  ALKPHOS 97  BILITOT 0.9  PROT 8.3*  ALBUMIN 2.6*   No results for input(s): "LIPASE", "AMYLASE" in the last 168 hours. No results for input(s): "AMMONIA" in the last 168 hours. Coagulation Profile: No results for input(s): "INR", "PROTIME" in the last 168 hours. Cardiac Enzymes: No results for input(s): "CKTOTAL", "CKMB", "CKMBINDEX", "TROPONINI" in the last 168 hours. BNP (last 3 results) No results for input(s): "PROBNP" in the last 8760 hours. HbA1C: No results for input(s): "HGBA1C" in the last 72 hours. CBG: Recent Labs  Lab 05/12/23 0701 05/12/23 0836 05/12/23 1113 05/12/23 1520 05/12/23 1715  GLUCAP 139* 247* 209* 87 99   Lipid Profile: No results for input(s): "CHOL", "HDL", "LDLCALC", "TRIG", "CHOLHDL", "LDLDIRECT" in the last 72 hours. Thyroid Function Tests: No results for input(s): "TSH", "T4TOTAL", "FREET4", "T3FREE", "THYROIDAB" in the last 72 hours. Anemia Panel: No results for input(s): "VITAMINB12", "FOLATE", "FERRITIN", "TIBC", "IRON", "RETICCTPCT" in the last 72 hours. Most Recent Urinalysis On File:     Component Value Date/Time   COLORURINE YELLOW (A) 06/12/2015 1252   APPEARANCEUR CLEAR (A) 06/12/2015 1252   LABSPEC 1.042 (H) 06/12/2015 1252   PHURINE 6.0 06/12/2015 1252   GLUCOSEU >500 (A) 06/12/2015 1252   HGBUR NEGATIVE 06/12/2015 1252   BILIRUBINUR NEGATIVE 06/12/2015 1252   KETONESUR TRACE (A) 06/12/2015 1252   PROTEINUR 100 (A) 06/12/2015 1252   NITRITE NEGATIVE 06/12/2015 1252   LEUKOCYTESUR NEGATIVE 06/12/2015 1252   Sepsis Labs: @LABRCNTIP (procalcitonin:4,lacticidven:4) Microbiology: Recent Results (from the past 240 hours)  Blood culture (single)     Status: None   Collection Time: 05/07/23  7:21 PM   Specimen: BLOOD  Result Value Ref Range Status   Specimen Description BLOOD LEFT FOREARM  Final   Special Requests   Final    BOTTLES DRAWN AEROBIC AND ANAEROBIC Blood Culture adequate volume   Culture   Final    NO GROWTH 5  DAYS Performed at [facility], [address] [place] [place], [place] [address]    Report Status 05/12/2023 FINAL  Final  Surgical pcr screen     Status: Abnormal   Collection Time: 05/08/23  8:51 AM   Specimen: Nasal Mucosa; Nasal Swab  Result Value Ref Range Status   MRSA, PCR NEGATIVE NEGATIVE Final   Staphylococcus aureus POSITIVE (A) NEGATIVE Final    Comment: (NOTE) The Xpert SA Assay (FDA approved for NASAL specimens in patients 47 years of age and older), is one component of a comprehensive surveillance program. It is not intended to diagnose infection nor to guide or monitor treatment. Performed at [facility], [address] [place], [place] [address]   Aerobic/Anaerobic Culture w Gram Stain (surgical/deep wound)     Status: None (Preliminary result)   Collection Time: 05/08/23 11:54 AM   Specimen: Path Tissue  Result Value Ref Range Status   Specimen Description   Final    TISSUE Performed at [facility], [address], [place], [place] [address]    Special Requests SWAB OF RIGHT FOOT DEEP ABSCESS  Final   Gram Stain   Final    NO WBC SEEN FEW GRAM POSITIVE COCCI IN PAIRS Performed at [facility], [address], [place], [place] [address]    Culture   Final    FEW ENTEROCOCCUS FAECALIS FEW METHICILLIN RESISTANT STAPHYLOCOCCUS AUREUS NO ANAEROBES ISOLATED; CULTURE IN PROGRESS FOR 5 DAYS    Report Status PENDING  Incomplete   Organism ID, Bacteria ENTEROCOCCUS FAECALIS  Final   Organism ID, Bacteria METHICILLIN RESISTANT STAPHYLOCOCCUS AUREUS  Final      Susceptibility   Enterococcus faecalis - MIC*    AMPICILLIN <=2 SENSITIVE Sensitive     VANCOMYCIN 2 SENSITIVE Sensitive     GENTAMICIN SYNERGY SENSITIVE Sensitive     * FEW ENTEROCOCCUS FAECALIS   Methicillin resistant staphylococcus aureus - MIC*    CIPROFLOXACIN 2 INTERMEDIATE Intermediate     ERYTHROMYCIN >=8 RESISTANT Resistant     GENTAMICIN <=0.5 SENSITIVE Sensitive      OXACILLIN RESISTANT Resistant     TETRACYCLINE <=1 SENSITIVE Sensitive     VANCOMYCIN <=0.5 SENSITIVE Sensitive     TRIMETH/SULFA <=10 SENSITIVE Sensitive     CLINDAMYCIN 2 INTERMEDIATE Intermediate     RIFAMPIN <=0.5 SENSITIVE Sensitive     Inducible Clindamycin NEGATIVE Sensitive     LINEZOLID 2 SENSITIVE Sensitive     * FEW METHICILLIN RESISTANT STAPHYLOCOCCUS AUREUS      Radiology Studies last 3 days: DG Foot Complete Right Result Date: 05/11/2023 CLINICAL DATA:  Postop evaluation. EXAM: RIGHT FOOT COMPLETE - 3+ VIEW COMPARISON:  Right foot radiograph dated 05/07/2023. FINDINGS: Transmetatarsal amputation of the foot. No acute fracture or dislocation. Postsurgical changes in the soft tissues and cutaneous staples. Overlying dressing noted. IMPRESSION: Transmetatarsal amputation of the foot. Electronically Signed   By: Elgie Collard M.D.   On: 05/11/2023 16:17   PERIPHERAL VASCULAR CATHETERIZATION Result Date: 05/09/2023 See surgical note for result.       Sunnie Nielsen, DO [facility] 05/12/2023, 7:01 PM    Dictation software may have been used to generate the above note. Typos may occur and escape review in typed/dictated notes. Please contact Dr Lyn Hollingshead directly for clarity if needed.  Staff may message me via secure chat in Epic  but this may not receive an immediate response,  please page me for urgent matters!  If 7PM-7AM, please contact night coverage [email or web address]

## 2023-05-12 NOTE — Anesthesia Preprocedure Evaluation (Addendum)
Anesthesia Evaluation  Patient identified by MRN, date of birth, ID band Patient awake    Reviewed: Allergy & Precautions, H&P , NPO status , Patient's Chart, lab work & pertinent test results, reviewed documented beta blocker date and time   Airway Mallampati: III  TM Distance: >3 FB Neck ROM: full    Dental  (+) Chipped   Pulmonary asthma    Pulmonary exam normal        Cardiovascular Exercise Tolerance: Good hypertension, On Medications + Peripheral Vascular Disease  Normal cardiovascular exam Rhythm:regular Rate:Normal     Neuro/Psych  PSYCHIATRIC DISORDERS Anxiety Depression    negative neurological ROS     GI/Hepatic negative GI ROS, Neg liver ROS,GERD  Medicated and Poorly Controlled,,  Endo/Other  diabetes, Poorly Controlled    Renal/GU      Musculoskeletal   Abdominal   Peds  Hematology negative hematology ROS (+)   Anesthesia Other Findings Past Medical History: No date: Benign enlargement of prostate No date: Bronchitis No date: Childhood asthma No date: Diabetes mellitus without complication (HCC) No date: Environmental allergies No date: Erectile dysfunction No date: Headache No date: Hemorrhoids No date: Hypercholesteremia No date: Hypertension No date: Hypogonadism in male No date: Lumbago No date: Over weight  Past Surgical History: 1992: BACK SURGERY No date: CHOLECYSTECTOMY 08/07/2015: COLONOSCOPY WITH PROPOFOL; N/A     Comment:  Procedure: COLONOSCOPY WITH PROPOFOL;  Surgeon: Christena Deem, MD;  Location: [facility] ENDOSCOPY;  Service:               Endoscopy;  Laterality: N/A; 08/08/2015: COLONOSCOPY WITH PROPOFOL; N/A     Comment:  Procedure: COLONOSCOPY WITH PROPOFOL;  Surgeon: Christena Deem, MD;  Location: [facility] ENDOSCOPY;  Service:               Endoscopy;  Laterality: N/A; 09/16/2017: COLONOSCOPY WITH PROPOFOL; N/A     Comment:  Procedure:  COLONOSCOPY WITH PROPOFOL;  Surgeon:               Christena Deem, MD;  Location: [facility] ENDOSCOPY;                Service: Endoscopy;  Laterality: N/A; 12/20/2020: COLONOSCOPY WITH PROPOFOL; N/A     Comment:  Procedure: COLONOSCOPY WITH PROPOFOL;  Surgeon: Earline Mayotte, MD;  Location: [facility] ENDOSCOPY;  Service:               Endoscopy;  Laterality: N/A;  IDDM 04/20/2019: CORONARY STENT INTERVENTION; N/A     Comment:  Procedure: CORONARY STENT INTERVENTION;  Surgeon:               Marcina Millard, MD;  Location: [facility] INVASIVE CV               LAB;  Service: Cardiovascular;  Laterality: N/A; 08/07/2015: ESOPHAGOGASTRODUODENOSCOPY (EGD) WITH PROPOFOL; N/A     Comment:  Procedure: ESOPHAGOGASTRODUODENOSCOPY (EGD) WITH               PROPOFOL;  Surgeon: Christena Deem, MD;  Location:               [facility] ENDOSCOPY;  Service: Endoscopy;  Laterality: N/A; 09/16/2017: ESOPHAGOGASTRODUODENOSCOPY (EGD) WITH PROPOFOL; N/A     Comment:  Procedure: ESOPHAGOGASTRODUODENOSCOPY (EGD) WITH  PROPOFOL;  Surgeon: Christena Deem, MD;  Location:               [facility] ENDOSCOPY;  Service: Endoscopy;  Laterality: N/A; 12/20/2020: ESOPHAGOGASTRODUODENOSCOPY (EGD) WITH PROPOFOL; N/A     Comment:  Procedure: ESOPHAGOGASTRODUODENOSCOPY (EGD) WITH               PROPOFOL;  Surgeon: Earline Mayotte, MD;  Location:               [facility] ENDOSCOPY;  Service: Endoscopy;  Laterality: N/A; 04/20/2019: LEFT HEART CATH AND CORONARY ANGIOGRAPHY; Left     Comment:  Procedure: LEFT HEART CATH AND CORONARY ANGIOGRAPHY;                Surgeon: Marcina Millard, MD;  Location: [facility]               INVASIVE CV LAB;  Service: Cardiovascular;  Laterality:               Left;  BMI    Body Mass Index: 33.72 kg/m      Reproductive/Obstetrics negative OB ROS                             Anesthesia Physical Anesthesia Plan  ASA: 3  Anesthesia Plan: General   Post-op  Pain Management:    Induction: Intravenous  PONV Risk Score and Plan: 2 and Ondansetron, Dexamethasone and Midazolam  Airway Management Planned: LMA  Additional Equipment:   Intra-op Plan:   Post-operative Plan: Extubation in OR  Informed Consent: I have reviewed the patients History and Physical, chart, labs and discussed the procedure including the risks, benefits and alternatives for the proposed anesthesia with the patient or authorized representative who has indicated his/her understanding and acceptance.     Dental Advisory Given  Plan Discussed with: Anesthesiologist, CRNA and Surgeon  Anesthesia Plan Comments: (Patient consented for risks of anesthesia including but not limited to:  - adverse reactions to medications - risk of airway placement if required - damage to eyes, teeth, lips or other oral mucosa - nerve damage due to positioning  - sore throat or hoarseness - Damage to heart, brain, nerves, lungs, other parts of body or loss of life  Patient voiced understanding and assent.)       Anesthesia Quick Evaluation

## 2023-05-12 NOTE — Care Management Important Message (Signed)
Important Message  Patient Details  Name: PAYMON BERKOVITZ MRN: [id number] Date of Birth: 1965-07-22   Important Message Given:  Yes - Medicare IM     Bernadette Hoit 05/12/2023, 11:13 AM

## 2023-05-12 NOTE — Plan of Care (Signed)
  Problem: Education: Goal: Ability to describe self-care measures that may prevent or decrease complications (Diabetes Survival Skills Education) will improve Outcome: Progressing Goal: Individualized Educational Video(s) Outcome: Progressing   Problem: Coping: Goal: Ability to adjust to condition or change in health will improve Outcome: Progressing   Problem: Fluid Volume: Goal: Ability to maintain a balanced intake and output will improve Outcome: Progressing   Problem: Health Behavior/Discharge Planning: Goal: Ability to identify and utilize available resources and services will improve Outcome: Progressing Goal: Ability to manage health-related needs will improve Outcome: Progressing   Problem: Metabolic: Goal: Ability to maintain appropriate glucose levels will improve Outcome: Progressing   Problem: Nutritional: Goal: Maintenance of adequate nutrition will improve Outcome: Progressing Goal: Progress toward achieving an optimal weight will improve Outcome: Progressing   Problem: Skin Integrity: Goal: Risk for impaired skin integrity will decrease Outcome: Progressing   Problem: Tissue Perfusion: Goal: Adequacy of tissue perfusion will improve Outcome: Progressing   Problem: Fluid Volume: Goal: Hemodynamic stability will improve Outcome: Progressing   Problem: Clinical Measurements: Goal: Diagnostic test results will improve Outcome: Progressing Goal: Signs and symptoms of infection will decrease Outcome: Progressing   Problem: Respiratory: Goal: Ability to maintain adequate ventilation will improve Outcome: Progressing   Problem: Education: Goal: Knowledge of General Education information will improve Description: Including pain rating scale, medication(s)/side effects and non-pharmacologic comfort measures Outcome: Progressing   Problem: Health Behavior/Discharge Planning: Goal: Ability to manage health-related needs will improve Outcome:  Progressing   Problem: Clinical Measurements: Goal: Ability to maintain clinical measurements within normal limits will improve Outcome: Progressing Goal: Will remain free from infection Outcome: Progressing Goal: Diagnostic test results will improve Outcome: Progressing Goal: Respiratory complications will improve Outcome: Progressing Goal: Cardiovascular complication will be avoided Outcome: Progressing   Problem: Activity: Goal: Risk for activity intolerance will decrease Outcome: Progressing   Problem: Nutrition: Goal: Adequate nutrition will be maintained Outcome: Progressing   Problem: Coping: Goal: Level of anxiety will decrease Outcome: Progressing   Problem: Elimination: Goal: Will not experience complications related to bowel motility Outcome: Progressing Goal: Will not experience complications related to urinary retention Outcome: Progressing   Problem: Pain Management: Goal: General experience of comfort will improve Outcome: Progressing   Problem: Safety: Goal: Ability to remain free from injury will improve Outcome: Progressing   Problem: Skin Integrity: Goal: Risk for impaired skin integrity will decrease Outcome: Progressing   Problem: Education: Goal: Understanding of CV disease, CV risk reduction, and recovery process will improve Outcome: Progressing Goal: Individualized Educational Video(s) Outcome: Progressing   Problem: Activity: Goal: Ability to return to baseline activity level will improve Outcome: Progressing   Problem: Cardiovascular: Goal: Ability to achieve and maintain adequate cardiovascular perfusion will improve Outcome: Progressing Goal: Vascular access site(s) Level 0-1 will be maintained Outcome: Progressing   Problem: Health Behavior/Discharge Planning: Goal: Ability to safely manage health-related needs after discharge will improve Outcome: Progressing

## 2023-05-12 NOTE — Anesthesia Postprocedure Evaluation (Signed)
Anesthesia Post Note  Patient: Gerald Ellis  Procedure(s) Performed: IRRIGATION AND DEBRIDEMENT FOOT, WASH OUT (Right: Foot)  Patient location during evaluation: PACU Anesthesia Type: General Level of consciousness: awake and alert Pain management: pain level controlled Vital Signs Assessment: post-procedure vital signs reviewed and stable Respiratory status: spontaneous breathing, nonlabored ventilation, respiratory function stable and patient connected to nasal cannula oxygen Cardiovascular status: blood pressure returned to baseline and stable Postop Assessment: no apparent nausea or vomiting Anesthetic complications: no  No notable events documented.   Last Vitals:  Vitals:   05/12/23 1725 05/12/23 1730  BP:    Pulse: 87 87  Resp: 17 (!) 22  Temp:    SpO2: 100% 95%    Last Pain:  Vitals:   05/12/23 1730  TempSrc:   PainSc: 8                  Stephanie Coup

## 2023-05-12 NOTE — Inpatient Diabetes Management (Signed)
Inpatient Diabetes Program Recommendations  AACE/ADA: New Consensus Statement on Inpatient Glycemic Control  Target Ranges:  Prepandial:   less than 140 mg/dL      Peak postprandial:   less than 180 mg/dL (1-2 hours)      Critically ill patients:  140 - 180 mg/dL     Review of Glycemic Control  Latest Reference Range & Units 05/11/23 05:31 05/11/23 09:30 05/11/23 11:39 05/11/23 17:30 05/11/23 19:47 05/11/23 23:28 05/12/23 07:01 05/12/23 08:36 05/12/23 11:13  Glucose-Capillary 70 - 99 mg/dL 295 (H) 621 (H) 308 (H) 200 (H) 252 (H) 188 (H) 139 (H) 247 (H) 209 (H)   Diabetes history: DM2 Outpatient Diabetes medications: Humalog 70 units TID with meals, Tresiba 142 units at bedtime, Metformin 1000 mg BID, Actos 15 mg BID Current orders for Inpatient glycemic control:  Semglee 38 units daily at bedtime Novolog 0-20 units Q4H Novolog 3 units tid meal coverage  Decadron 5 mg given lunchtime on 12/19 Ensure Enlive  bid between meals (40 grams of carbohydrates)  -   Increase Novolog meal coverage to 5 units tid if eating >50% of meals  Thanks, Christena Deem RN, MSN, BC-ADM Inpatient Diabetes Coordinator Team Pager [phone] (8a-5p)

## 2023-05-12 NOTE — Hospital Course (Addendum)
HPI: Gerald Ellis is a 57 y.o. male with medical history significant for HTN, HLD, CAD s/p stent, DM with neuropathy, chronic back pain on opiates,, anxiety, chronic ulcer first right MTP, followed by podiatry (last seen 02/18/2023), a with normal resting ABI at the time and treated with topical dressings who presents to the ED on 05/07/23 with a 1 week history of blistering in the area of the wound and then over the past day he noted that his second and third toes of the right foot were turning black.   Hospital course / significant events:  12/19: admitted to hospitalist service, cefepime + vanc, podiatry and vascular consults, underwent transmetatarsal amputation R foot 12/20: underwent angiography RLE w/ stent placement  12/21: monitoring post-revascularization 12/22: podiatry reeval, planning to OR for washout/I&D tomorrow given sub-cutaneous gas on x-ray today. Wound Cx (+)mrsa, enterococcus  12/23: underwent irrigation/debridement R foot, washout  12/24: foot not healing / not salvageable, planning BKA.  12/25: stable, await BKA tomorrow  12/26: underwent BKA 12/27: bleeding from stump overnight, back to OR for revision d/t hematoma   12/28: stable, planning for bandage change Mon 12/30  Consultants:  Podiatry  Vascular Surgery  Infectious disease   Procedures/Surgeries: 05/08/2023: Transmetatarsal amputation right foot - Dr Alberteen Spindle  05/09/2023: RLE angioplasty w/ stent to superficial femoral, popliteal, anterior tibial arteries - Dr. 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 Prasugrel  Debility, ambulatory dysfunction  HH PT/OT  R BKA stump hematoma - corrected w/ revision surgery 12/27 S/p revision 05/16/2023 Vascular surgery monitoring  Follow HH  Obesity Class 1 based on BMI: Body mass index is 33.72 kg/m.  Underweight - under 18  overweight - 25 to 29 obese - 30 or more Class 1 obesity: BMI of 30.0 to 34 Class 2 obesity: BMI of 35.0 to 39 Class 3 obesity: BMI of 40.0 to 49 Super Morbid Obesity: BMI 50-59 Super-super Morbid Obesity: BMI 60+ Significantly low or high BMI is associated with higher medical risk.  Weight management advised as adjunct to other disease management and risk reduction treatments    DVT prophylaxis: lovenox  IV fluids: no continuous IV fluids  Nutrition: diabetic diet  Central lines / invasive devices: none  Code Status: FULL CODE ACP documentation reviewed:  none on file in [facility]  TOC needs: pend PT/OT reeval, may need SNF/rehab now that has BKA  Barriers to dispo / significant pending items: PT/OT today if able, there was some bleeding from stump overnight, pending surgeon reeval

## 2023-05-12 NOTE — Plan of Care (Signed)
  Problem: Education: Goal: Ability to describe self-care measures that may prevent or decrease complications (Diabetes Survival Skills Education) will improve 05/12/2023 0043 by Natasha Mead, RN Outcome: Progressing 05/12/2023 0015 by Natasha Mead, RN Outcome: Progressing Goal: Individualized Educational Video(s) 05/12/2023 0043 by Natasha Mead, RN Outcome: Progressing 05/12/2023 0015 by Natasha Mead, RN Outcome: Progressing   Problem: Coping: Goal: Ability to adjust to condition or change in health will improve 05/12/2023 0043 by Natasha Mead, RN Outcome: Progressing 05/12/2023 0015 by Natasha Mead, RN Outcome: Progressing   Problem: Fluid Volume: Goal: Ability to maintain a balanced intake and output will improve 05/12/2023 0043 by Natasha Mead, RN Outcome: Progressing 05/12/2023 0015 by Natasha Mead, RN Outcome: Progressing   Problem: Health Behavior/Discharge Planning: Goal: Ability to identify and utilize available resources and services will improve 05/12/2023 0043 by Natasha Mead, RN Outcome: Progressing 05/12/2023 0015 by Natasha Mead, RN Outcome: Progressing Goal: Ability to manage health-related needs will improve 05/12/2023 0043 by Natasha Mead, RN Outcome: Progressing 05/12/2023 0015 by Natasha Mead, RN Outcome: Progressing

## 2023-05-12 NOTE — Plan of Care (Signed)

## 2023-05-13 ENCOUNTER — Inpatient Hospital Stay: Payer: PPO

## 2023-05-13 ENCOUNTER — Encounter: Payer: Self-pay | Admitting: Podiatry

## 2023-05-13 DIAGNOSIS — L089 Local infection of the skin and subcutaneous tissue, unspecified: Secondary | ICD-10-CM | POA: Diagnosis not present

## 2023-05-13 DIAGNOSIS — Z95828 Presence of other vascular implants and grafts: Secondary | ICD-10-CM

## 2023-05-13 DIAGNOSIS — M869 Osteomyelitis, unspecified: Secondary | ICD-10-CM

## 2023-05-13 DIAGNOSIS — E11628 Type 2 diabetes mellitus with other skin complications: Secondary | ICD-10-CM | POA: Diagnosis not present

## 2023-05-13 LAB — GLUCOSE, CAPILLARY
Glucose-Capillary: 129 mg/dL — ABNORMAL HIGH (ref 70–99)
Glucose-Capillary: 165 mg/dL — ABNORMAL HIGH (ref 70–99)
Glucose-Capillary: 179 mg/dL — ABNORMAL HIGH (ref 70–99)
Glucose-Capillary: 218 mg/dL — ABNORMAL HIGH (ref 70–99)
Glucose-Capillary: 219 mg/dL — ABNORMAL HIGH (ref 70–99)
Glucose-Capillary: 86 mg/dL (ref 70–99)

## 2023-05-13 LAB — AEROBIC/ANAEROBIC CULTURE W GRAM STAIN (SURGICAL/DEEP WOUND): Gram Stain: NONE SEEN

## 2023-05-13 MED ORDER — POLYETHYLENE GLYCOL 3350 17 G PO PACK
17.0000 g | PACK | Freq: Every day | ORAL | Status: DC
  Administered 2023-05-13 – 2023-05-21 (×9): 17 g via ORAL
  Filled 2023-05-13 (×12): qty 1

## 2023-05-13 MED ORDER — MAGNESIUM HYDROXIDE 400 MG/5ML PO SUSP: 30.0000 mL | Freq: Every evening | ORAL | Status: DC | PRN

## 2023-05-13 MED ORDER — SODIUM CHLORIDE 0.9 % IV SOLN: INTRAVENOUS | Status: DC

## 2023-05-13 MED ORDER — SENNOSIDES-DOCUSATE SODIUM 8.6-50 MG PO TABS
2.0000 | ORAL_TABLET | Freq: Once | ORAL | Status: AC
  Administered 2023-05-13: 2 via ORAL
  Filled 2023-05-13: qty 2

## 2023-05-13 MED ORDER — CEFAZOLIN SODIUM-DEXTROSE 2-4 GM/100ML-% IV SOLN
2.0000 g | INTRAVENOUS | Status: AC
  Administered 2023-05-15: 2 g via INTRAVENOUS

## 2023-05-13 MED ORDER — VANCOMYCIN HCL 1.25 G IV SOLR
1250.0000 mg | Freq: Two times a day (BID) | INTRAVENOUS | Status: DC
  Administered 2023-05-13 – 2023-05-15 (×4): 1250 mg via INTRAVENOUS
  Filled 2023-05-13 (×6): qty 25

## 2023-05-13 NOTE — Progress Notes (Signed)
1 Day Post-Op   Subjective/Chief Complaint: Patient seen.  Not complaining of any significant pain.   Objective: Vital signs in last 24 hours: Temp:  [98 F (36.7 C)-99.3 F (37.4 C)] 98.2 F (36.8 C) (12/24 0746) Pulse Rate:  [80-89] 85 (12/24 0746) Resp:  [9-22] 16 (12/24 0746) BP: (120-140)/(74-87) 140/81 (12/24 0746) SpO2:  [91 %-100 %] 95 % (12/24 0746) Weight:  [106.6 kg] 106.6 kg (12/23 1519) Last BM Date : 05/09/23  Intake/Output from previous day: 12/23 0701 - 12/24 0700 In: 800 [I.V.:800] Out: 1655 [Urine:1650; Blood:5] Intake/Output this shift: Total I/O In: 280 [P.O.:280] Out: 600 [Urine:600]  Incision on the right foot is well coapted with staples intact.  Some early signs of continued necrosis along the plantar flap noted today along the lateral aspect.  X-rays taken earlier today did reveal a couple of small areas of continued gas in the soft tissues.     Lab Results:  Recent Labs    05/11/23 0246  WBC 10.5  HGB 9.5*  HCT 28.3*  PLT 362   BMET Recent Labs    05/11/23 0246  NA 132*  K 3.6  CL 101  CO2 23  GLUCOSE 172*  BUN 19  CREATININE 0.93  CALCIUM 7.6*   PT/INR No results for input(s): "LABPROT", "INR" in the last 72 hours. ABG No results for input(s): "PHART", "HCO3" in the last 72 hours.  Invalid input(s): "PCO2", "PO2"  Studies/Results: No results found.  Anti-infectives: Anti-infectives (From admission, onward)    Start     Dose/Rate Route Frequency Ordered Stop   05/12/23 0000  vancomycin (VANCOREADY) IVPB 1250 mg/250 mL        1,250 mg 166.7 mL/hr over 90 Minutes Intravenous Every 12 hours 05/11/23 1345     05/08/23 1000  vancomycin (VANCOCIN) IVPB 1000 mg/200 mL premix  Status:  Discontinued        1,000 mg 200 mL/hr over 60 Minutes Intravenous Every 12 hours 05/07/23 2251 05/11/23 1345   05/08/23 0400  ceFEPIme (MAXIPIME) 2 g in sodium chloride 0.9 % 100 mL IVPB  Status:  Discontinued        2 g 200 mL/hr over 30  Minutes Intravenous Every 8 hours 05/07/23 2252 05/13/23 0804   05/07/23 2230  ceFEPIme (MAXIPIME) 2 g in sodium chloride 0.9 % 100 mL IVPB  Status:  Discontinued        2 g 200 mL/hr over 30 Minutes Intravenous  Once 05/07/23 2222 05/07/23 2251   05/07/23 2230  metroNIDAZOLE (FLAGYL) IVPB 500 mg  Status:  Discontinued        500 mg 100 mL/hr over 60 Minutes Intravenous Every 12 hours 05/07/23 2222 05/08/23 0953   05/07/23 2230  vancomycin (VANCOCIN) IVPB 1000 mg/200 mL premix  Status:  Discontinued        1,000 mg 200 mL/hr over 60 Minutes Intravenous  Once 05/07/23 2222 05/07/23 2224   05/07/23 2230  Vancomycin (VANCOCIN) 1,500 mg in sodium chloride 0.9 % 500 mL IVPB        1,500 mg 250 mL/hr over 120 Minutes Intravenous  Once 05/07/23 2225 05/08/23 0312   05/07/23 2130  vancomycin (VANCOCIN) IVPB 1000 mg/200 mL premix        1,000 mg 200 mL/hr over 60 Minutes Intravenous  Once 05/07/23 2128 05/08/23 0114   05/07/23 2130  piperacillin-tazobactam (ZOSYN) IVPB 3.375 g        3.375 g 100 mL/hr over 30 Minutes Intravenous  Once 05/07/23  2128 05/07/23 2216       Assessment/Plan: s/p Procedure(s): IRRIGATION AND DEBRIDEMENT FOOT, WASH OUT (Right) Assessment: Status post transmetatarsal amputation right foot with some progressive necrosis and gas.  Plan: Betadine gauze dressing reapplied to the right foot.  Discussed with the patient and family members present that at this point I think the attempt at limb salvage is unsuccessful and he does have a nonsalvageable foot.  Discussed that he will need to go ahead and consider below-knee amputation as previously discussed was a possibility.  Discussed the patient with Dr. Wyn Quaker who states he can most likely perform this on Thursday.  Podiatry will sign off at this point.  LOS: 6 days    [name] 05/13/2023

## 2023-05-13 NOTE — Progress Notes (Signed)
PT Cancellation Note  Patient Details Name: Gerald Ellis MRN: [id number] DOB: 11/07/1965   Cancelled Treatment:    Reason Eval/Treat Not Completed: Other (comment).  Chart reviewed and attempted to perform therapy.  Pt ultimately declined due to already being up with OT.  Pt is scheduled to have BKA on Thursday.  Will re-eval following surgical procedure.   Nolon Bussing, PT, DPT Physical Therapist - [facility]  05/13/23, 4:23 PM

## 2023-05-13 NOTE — Evaluation (Signed)
Occupational Therapy Re-Evaluation Patient Details Name: Gerald Ellis MRN: [id number] DOB: 1965-11-16 Today's Date: 05/13/2023   History of Present Illness 57 y.o. male with medical history significant for HTN, HLD, CAD s/p stent, DM with neuropathy, chronic back pain on opiates,, anxiety, chronic ulcer first right MTP, followed by podiatry (last seen 02/18/2023), a with normal resting ABI at the time and treated with topical dressings who presents to the ED with a 1 week history of blistering in the area of the wound and then over the past day he noted that his second and third toes of the right foot were turning black. 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 NWB to RLE, s/p I&D 12/23 to RLE   Clinical Impression   Pt seen for re-eval due to I&D performed 12/23. Pt making good progress towards goals, benefits from vcs to maintain NWB status on RLE. Pt completed bed mobility mod independent, UB bathing with setup, LB bathing with CGA for safety with vc from sit<>stand, functional mobility with RW (good use of keeping R foot off ground) t/f bathroom and adhered to WB precautions for toileting tasks and transfer back to recliner. Pt will benefit from [facility] OT upon hospital discharge, plans to stay with sister at d/c. OT will continue to follow for functional gains.       If plan is discharge home, recommend the following: A little help with walking and/or transfers;A little help with bathing/dressing/bathroom;Assistance with cooking/housework;Assist for transportation;Help with stairs or ramp for entrance       Equipment Recommendations  Other (comment)       Precautions / Restrictions Precautions Precautions: Fall Restrictions Weight Bearing Restrictions Per Provider Order: Yes RLE Weight Bearing Per Provider Order: Non weight bearing      Mobility Bed Mobility Overal bed mobility: Modified Independent                  Transfers Overall transfer level: Needs  assistance Equipment used: Rolling walker (2 wheels) Transfers: Sit to/from Stand Sit to Stand: Contact guard assist           General transfer comment: NWB to RLE maintained      Balance Overall balance assessment: Needs assistance Sitting-balance support: No upper extremity supported, Feet unsupported Sitting balance-Leahy Scale: Normal     Standing balance support: Bilateral upper extremity supported Standing balance-Leahy Scale: Good                             ADL either performed or assessed with clinical judgement   ADL Overall ADL's : Needs assistance/impaired     Grooming: Wash/dry hands;Wash/dry face;Sitting;Set up   Upper Body Bathing: Set up;Sitting   Lower Body Bathing: Contact guard assist;Cueing for safety;Sit to/from stand Lower Body Bathing Details (indicate cue type and reason): vcs for precautions, pt tends to want to use heel as stabilizer         Toilet Transfer: Contact guard assist;BSC/3in1;Rolling walker (2 wheels);Regular Teacher, adult education Details (indicate cue type and reason): BSC over toilet, use of rails to help lower himself slowly Toileting- Clothing Manipulation and Hygiene: Sitting/lateral lean;Supervision/safety       Functional mobility during ADLs: Contact guard assist;Cueing for safety;Cueing for sequencing;Rolling walker (2 wheels) General ADL Comments: VC for NWBing as he has tendency to rest R heel on the floor      Pertinent Vitals/Pain Pain Assessment Pain Assessment: 0-10 Pain Score: 3  Pain Location: R foot Pain Descriptors /  Indicators: Aching Pain Intervention(s): Limited activity within patient's tolerance, Monitored during session     Extremity/Trunk Assessment     Lower Extremity Assessment RLE Deficits / Details: s/p transmet amp       Communication     Cognition Arousal: Alert Behavior During Therapy: WFL for tasks assessed/performed Overall Cognitive Status: Within Functional  Limits for tasks assessed                                       General Comments  R foot in ace wrap                  OT Goals(Current goals can be found in the care plan section) Acute Rehab OT Goals Patient Stated Goal: to go home to sisters house OT Goal Formulation: With patient Time For Goal Achievement: 05/27/23 Potential to Achieve Goals: Good ADL Goals Pt Will Perform Lower Body Dressing: with modified independence;sitting/lateral leans Pt Will Transfer to Toilet: with modified independence;ambulating Pt Will Perform Toileting - Clothing Manipulation and hygiene: with modified independence;sitting/lateral leans Additional ADL Goal #1: Pt will complete ADL/mobility tasks while maintaining WBing precautions throughout and no VC needed to utilize, 3/3 opportunities.  OT Frequency: Min 1X/week       AM-PAC OT "6 Clicks" Daily Activity     Outcome Measure Help from another person eating meals?: None Help from another person taking care of personal grooming?: None Help from another person toileting, which includes using toliet, bedpan, or urinal?: A Little Help from another person bathing (including washing, rinsing, drying)?: A Little Help from another person to put on and taking off regular upper body clothing?: None Help from another person to put on and taking off regular lower body clothing?: A Little 6 Click Score: 21   End of Session Equipment Utilized During Treatment: Gait belt;Rolling walker (2 wheels) Nurse Communication: Mobility status  Activity Tolerance: Patient tolerated treatment well Patient left: with family/visitor present;with nursing/sitter in room;with chair alarm set;with call bell/phone within reach;in chair  OT Visit Diagnosis: Other abnormalities of gait and mobility (R26.89);Pain Pain - Right/Left: Right Pain - part of body: Ankle and joints of foot                Time: 9562-1308 OT Time Calculation (min): 33 min Charges:   OT General Charges $OT Visit: 1 Visit OT Evaluation $OT Re-eval: 1 Re-eval OT Treatments $Self Care/Home Management : 23-37 mins  Veda Arrellano L. Shamari Trostel, OTR/L  05/13/23, 11:39 AM

## 2023-05-13 NOTE — Progress Notes (Signed)
[facility]  Daily Progress Note   Subjective  -   Patient status post revascularization last week but foot remains poorly healing.  I have discussed the case with podiatry and the foot at this point is not really salvageable.  He and his family are coming to terms with this.  Objective Vitals:   05/12/23 1755 05/12/23 1800 05/12/23 1940 05/13/23 0746  BP:  125/81 133/77 (!) 140/81  Pulse: 85 86 83 85  Resp: 16 (!) 9 18 16   Temp:   98 F (36.7 C) 98.2 F (36.8 C)  TempSrc:    Oral  SpO2: 91% 93% 95% 95%  Weight:      Height:        Intake/Output Summary (Last 24 hours) at 05/13/2023 1333 Last data filed at 05/13/2023 1035 Gross per 24 hour  Intake 1080 ml  Output 1355 ml  Net -275 ml    PULM  CTAB CV  RRR VASC  right foot wound is dressed.  Pictures are in the chart.  Laboratory CBC    Component Value Date/Time   WBC 10.5 05/11/2023 0246   HGB 9.5 (L) 05/11/2023 0246   HCT 28.3 (L) 05/11/2023 0246   HCT 48.9 07/31/2015 0838   PLT 362 05/11/2023 0246    BMET    Component Value Date/Time   NA 132 (L) 05/11/2023 0246   K 3.6 05/11/2023 0246   CL 101 05/11/2023 0246   CO2 23 05/11/2023 0246   GLUCOSE 172 (H) 05/11/2023 0246   BUN 19 05/11/2023 0246   CREATININE 0.93 05/11/2023 0246   CALCIUM 7.6 (L) 05/11/2023 0246   GFRNONAA >60 05/11/2023 0246   GFRAA >60 04/21/2019 0515    Assessment/Planning: POD #4 s/p RLE revascularization  Have discussed the case with Dr. Alberteen Spindle as well as the family. At this point, he is going to need a below-knee amputation to get adequate control of infection and wound healing.  Attempts to transmetatarsal amputation have been done.  His foot does not appear salvageable.  He and his family are agreeable to below-knee amputation. I will plan to put this on the schedule for Thursday.  Festus Barren  05/13/2023, 1:33 PM

## 2023-05-13 NOTE — Progress Notes (Signed)
PROGRESS NOTE    Gerald Ellis   WNU:[id number] DOB: 01-18-1966  DOA: 05/07/2023 Date of Service: 05/13/23 which is hospital day 6  PCP: Jerl Mina, MD    HPI: Gerald Ellis is a 57 y.o. male with medical history significant for HTN, HLD, CAD s/p stent, DM with neuropathy, chronic back pain on opiates,, anxiety, chronic ulcer first right MTP, followed by podiatry (last seen 02/18/2023), a with normal resting ABI at the time and treated with topical dressings who presents to the ED on 05/07/23 with a 1 week history of blistering in the area of the wound and then over the past day he noted that his second and third toes of the right foot were turning black.   Hospital course / significant events:  12/19: admitted to hospitalist service, started cefepime + vanc, podiatry and vascular consults, underwent transmetatarsal amputation R foot 12/20: underwent angiography RLE w/ stent placement  12/21: monitoring post-revascularization 12/22: podiatry reeval, planning to OR for washout/I&D tomorrow given sub-cutaneous gas on x-ray today. Wound Cx (+)mrsa, enterococcus  12/23: irrigation/debridement R foot, washout  12/24: per podiatry, foot wound appears still infection, concern may need higher amputation, Dr Alberteen Spindle d/w vascular team.  Consultants:  Podiatry  Vascular Surgery   Procedures/Surgeries: 05/08/2023: Transmetatarsal amputation right foot - Dr Alberteen Spindle  05/09/2023: RLE angioplasty w/ stent to superficial femoral, popliteal, anterior tibial arteries - Dr. 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 Prasugrel  Debility, ambulatory dysfunction  HH PT/OT  Obesity Class 1 based on BMI: Body mass index is 33.72 kg/m.  Underweight - under 18  overweight - 25 to 29 obese - 30 or more Class 1 obesity: BMI of 30.0 to 34 Class 2 obesity: BMI of 35.0 to 39 Class 3 obesity: BMI of 40.0 to 49 Super Morbid Obesity: BMI 50-59 Super-super Morbid Obesity: BMI 60+ Significantly low or high BMI is associated with higher medical risk.  Weight management advised as adjunct to other disease management and risk reduction treatments    DVT prophylaxis: lovenox  IV fluids: no continuous IV fluids  Nutrition: diabetic diet  Central lines / invasive devices: none  Code Status: FULL CODE ACP documentation reviewed:  none on file in [facility]  TOC needs: HH Barriers to dispo / significant pending items: possible higher amputation, needing VI abx, expect will be here several more days and may end up needing SNF              Subjective / Brief ROS:  Patient reports no concerns at this time other than some pain RLE No fever/chills  Denies CP/SOB.  Pain controlled.  Tolerating diet  Denies new weakness.  Reports no concerns w/ urination/defecation.   Family Communication: none at this time    Objective Findings:  Vitals:   05/12/23 1755 05/12/23 1800 05/12/23 1940 05/13/23 0746  BP:  125/81 133/77 Marland Kitchen)  140/81  Pulse: 85 86 83 85  Resp: 16 (!) 9 18 16   Temp:   98 F (36.7 C) 98.2 F (36.8 C)  TempSrc:    Oral  SpO2: 91% 93% 95% 95%  Weight:      Height:        Intake/Output Summary (Last 24 hours) at 05/13/2023 1316 Last data filed at 05/13/2023 1035 Gross per 24 hour  Intake 1080 ml  Output  1355 ml  Net -275 ml   Filed Weights   05/07/23 1918 05/12/23 1519  Weight: 106.6 kg 106.6 kg    Examination:  Physical Exam Constitutional:      General: He is not in acute distress.    Appearance: He is not ill-appearing.  Cardiovascular:     Rate and Rhythm: Normal rate and regular rhythm.  Pulmonary:     Effort: Pulmonary effort is normal.     Breath sounds: Normal breath sounds.  Neurological:     General: No focal deficit present.     Mental Status: He is alert and oriented to person, place, and time.  Psychiatric:        Mood and Affect: Mood normal.        Behavior: Behavior normal.          Scheduled Medications:   amLODipine  5 mg Oral Daily   aspirin  81 mg Oral Daily   atorvastatin  80 mg Oral q1800   busPIRone  7.5 mg Oral BID   enoxaparin (LOVENOX) injection  0.5 mg/kg Subcutaneous Daily   feeding supplement  237 mL Oral BID BM   insulin aspart  0-20 Units Subcutaneous Q4H   insulin aspart  3 Units Subcutaneous TID WC   insulin glargine-yfgn  38 Units Subcutaneous QHS   isosorbide mononitrate  30 mg Oral Daily   metoprolol succinate  50 mg Oral Daily   omega-3 acid ethyl esters  1 g Oral BID   pantoprazole  40 mg Oral BID   prasugrel  10 mg Oral Daily   sodium chloride flush  3 mL Intravenous Q12H   sodium chloride flush  3-10 mL Intravenous Q12H    Continuous Infusions:  vancomycin 1,250 mg (05/13/23 1131)    PRN Medications:  acetaminophen **OR** acetaminophen, cyclobenzaprine, HYDROcodone-acetaminophen, ipratropium-albuterol, morphine injection, ondansetron **OR** ondansetron (ZOFRAN) IV, phenol, sodium chloride flush, sodium chloride flush  Antimicrobials from admission:  Anti-infectives (From admission, onward)    Start     Dose/Rate Route Frequency Ordered Stop   05/12/23 0000  vancomycin (VANCOREADY) IVPB 1250 mg/250 mL        1,250 mg 166.7 mL/hr over 90 Minutes Intravenous Every 12 hours 05/11/23 1345     05/08/23 1000   vancomycin (VANCOCIN) IVPB 1000 mg/200 mL premix  Status:  Discontinued        1,000 mg 200 mL/hr over 60 Minutes Intravenous Every 12 hours 05/07/23 2251 05/11/23 1345   05/08/23 0400  ceFEPIme (MAXIPIME) 2 g in sodium chloride 0.9 % 100 mL IVPB  Status:  Discontinued        2 g 200 mL/hr over 30 Minutes Intravenous Every 8 hours 05/07/23 2252 05/13/23 0804   05/07/23 2230  ceFEPIme (MAXIPIME) 2 g in sodium chloride 0.9 % 100 mL IVPB  Status:  Discontinued        2 g 200 mL/hr over 30 Minutes Intravenous  Once 05/07/23 2222 05/07/23 2251   05/07/23 2230  metroNIDAZOLE (FLAGYL) IVPB 500 mg  Status:  Discontinued  500 mg 100 mL/hr over 60 Minutes Intravenous Every 12 hours 05/07/23 2222 05/08/23 0953   05/07/23 2230  vancomycin (VANCOCIN) IVPB 1000 mg/200 mL premix  Status:  Discontinued        1,000 mg 200 mL/hr over 60 Minutes Intravenous  Once 05/07/23 2222 05/07/23 2224   05/07/23 2230  Vancomycin (VANCOCIN) 1,500 mg in sodium chloride 0.9 % 500 mL IVPB        1,500 mg 250 mL/hr over 120 Minutes Intravenous  Once 05/07/23 2225 05/08/23 0312   05/07/23 2130  vancomycin (VANCOCIN) IVPB 1000 mg/200 mL premix        1,000 mg 200 mL/hr over 60 Minutes Intravenous  Once 05/07/23 2128 05/08/23 0114   05/07/23 2130  piperacillin-tazobactam (ZOSYN) IVPB 3.375 g        3.375 g 100 mL/hr over 30 Minutes Intravenous  Once 05/07/23 2128 05/07/23 2216           Data Reviewed:  I have personally reviewed the following...  CBC: Recent Labs  Lab 05/07/23 1921 05/08/23 0431 05/09/23 0428 05/10/23 0412 05/11/23 0246  WBC 15.0* 12.8* 14.5* 12.9* 10.5  NEUTROABS 12.3*  --   --   --   --   HGB 12.1* 10.4* 10.6* 9.7* 9.5*  HCT 36.3* 29.9* 30.4* 28.4* 28.3*  MCV 87.1 83.3 83.1 84.3 86.5  PLT 491* 407* 414* 397 362   Basic Metabolic Panel: Recent Labs  Lab 05/07/23 1921 05/08/23 0431 05/09/23 0428 05/10/23 0412 05/11/23 0246  NA 130* 133* 132* 133* 132*  K 4.1 3.5 3.7 3.5  3.6  CL 94* 100 100 102 101  CO2 25 22 23 25 23   GLUCOSE 396* 205* 265* 216* 172*  BUN 12 10 17 20 19   CREATININE 0.98 0.91 0.87 0.86 0.93  CALCIUM 8.4* 7.9* 7.9* 7.8* 7.6*   GFR: Estimated Creatinine Clearance: 108.4 mL/min (by C-G formula based on SCr of 0.93 mg/dL). Liver Function Tests: Recent Labs  Lab 05/07/23 1921  AST 36  ALT 41  ALKPHOS 97  BILITOT 0.9  PROT 8.3*  ALBUMIN 2.6*   No results for input(s): "LIPASE", "AMYLASE" in the last 168 hours. No results for input(s): "AMMONIA" in the last 168 hours. Coagulation Profile: No results for input(s): "INR", "PROTIME" in the last 168 hours. Cardiac Enzymes: No results for input(s): "CKTOTAL", "CKMB", "CKMBINDEX", "TROPONINI" in the last 168 hours. BNP (last 3 results) No results for input(s): "PROBNP" in the last 8760 hours. HbA1C: No results for input(s): "HGBA1C" in the last 72 hours. CBG: Recent Labs  Lab 05/12/23 1947 05/12/23 2359 05/13/23 0455 05/13/23 0823 05/13/23 1130  GLUCAP 147* 165* 129* 86 179*   Lipid Profile: No results for input(s): "CHOL", "HDL", "LDLCALC", "TRIG", "CHOLHDL", "LDLDIRECT" in the last 72 hours. Thyroid Function Tests: No results for input(s): "TSH", "T4TOTAL", "FREET4", "T3FREE", "THYROIDAB" in the last 72 hours. Anemia Panel: No results for input(s): "VITAMINB12", "FOLATE", "FERRITIN", "TIBC", "IRON", "RETICCTPCT" in the last 72 hours. Most Recent Urinalysis On File:     Component Value Date/Time   COLORURINE YELLOW (A) 06/12/2015 1252   APPEARANCEUR CLEAR (A) 06/12/2015 1252   LABSPEC 1.042 (H) 06/12/2015 1252   PHURINE 6.0 06/12/2015 1252   GLUCOSEU >500 (A) 06/12/2015 1252   HGBUR NEGATIVE 06/12/2015 1252   BILIRUBINUR NEGATIVE 06/12/2015 1252   KETONESUR TRACE (A) 06/12/2015 1252   PROTEINUR 100 (A) 06/12/2015 1252   NITRITE NEGATIVE 06/12/2015 1252   LEUKOCYTESUR NEGATIVE 06/12/2015 1252   Sepsis  Labs: @LABRCNTIP (procalcitonin:4,lacticidven:4)  Microbiology: Recent Results (from the past 240 hours)  Blood culture (single)     Status: None   Collection Time: 05/07/23  7:21 PM   Specimen: BLOOD  Result Value Ref Range Status   Specimen Description BLOOD LEFT FOREARM  Final   Special Requests   Final    BOTTLES DRAWN AEROBIC AND ANAEROBIC Blood Culture adequate volume   Culture   Final    NO GROWTH 5 DAYS Performed at [facility], [address] [place], [place] [address]    Report Status 05/12/2023 FINAL  Final  Surgical pcr screen     Status: Abnormal   Collection Time: 05/08/23  8:51 AM   Specimen: Nasal Mucosa; Nasal Swab  Result Value Ref Range Status   MRSA, PCR NEGATIVE NEGATIVE Final   Staphylococcus aureus POSITIVE (A) NEGATIVE Final    Comment: (NOTE) The Xpert SA Assay (FDA approved for NASAL specimens in patients 46 years of age and older), is one component of a comprehensive surveillance program. It is not intended to diagnose infection nor to guide or monitor treatment. Performed at [facility], [address] [place], [place] [address]   Aerobic/Anaerobic Culture w Gram Stain (surgical/deep wound)     Status: None (Preliminary result)   Collection Time: 05/08/23 11:54 AM   Specimen: Path Tissue  Result Value Ref Range Status   Specimen Description   Final    TISSUE Performed at [facility], [address] [place], [place] [address]    Special Requests SWAB OF RIGHT FOOT DEEP ABSCESS  Final   Gram Stain   Final    NO WBC SEEN FEW GRAM POSITIVE COCCI IN PAIRS Performed at [facility], [address], [place], [place] [address]    Culture   Final    FEW ENTEROCOCCUS FAECALIS FEW METHICILLIN RESISTANT STAPHYLOCOCCUS AUREUS NO ANAEROBES ISOLATED; CULTURE IN PROGRESS FOR 5 DAYS    Report Status PENDING  Incomplete   Organism ID, Bacteria ENTEROCOCCUS FAECALIS  Final   Organism ID, Bacteria METHICILLIN RESISTANT  STAPHYLOCOCCUS AUREUS  Final      Susceptibility   Enterococcus faecalis - MIC*    AMPICILLIN <=2 SENSITIVE Sensitive     VANCOMYCIN 2 SENSITIVE Sensitive     GENTAMICIN SYNERGY SENSITIVE Sensitive     * FEW ENTEROCOCCUS FAECALIS   Methicillin resistant staphylococcus aureus - MIC*    CIPROFLOXACIN 2 INTERMEDIATE Intermediate     ERYTHROMYCIN >=8 RESISTANT Resistant     GENTAMICIN <=0.5 SENSITIVE Sensitive     OXACILLIN RESISTANT Resistant     TETRACYCLINE <=1 SENSITIVE Sensitive     VANCOMYCIN <=0.5 SENSITIVE Sensitive     TRIMETH/SULFA <=10 SENSITIVE Sensitive     CLINDAMYCIN 2 INTERMEDIATE Intermediate     RIFAMPIN <=0.5 SENSITIVE Sensitive     Inducible Clindamycin NEGATIVE Sensitive     LINEZOLID 2 SENSITIVE Sensitive     * FEW METHICILLIN RESISTANT STAPHYLOCOCCUS AUREUS  Aerobic/Anaerobic Culture w Gram Stain (surgical/deep wound)     Status: None (Preliminary result)   Collection Time: 05/12/23  4:29 PM   Specimen: Path Tissue  Result Value Ref Range Status   Specimen Description   Final    TISSUE Performed at [facility], [address], [place], [place] [address]    Special Requests   Final    NONE Performed at [facility], [address], [place], [place] [address]    Gram Stain   Final    RARE WBC  PRESENT,BOTH PMN AND MONONUCLEAR NO ORGANISMS SEEN    Culture   Final    NO GROWTH < 24 HOURS Performed at [facility], [address], [place], [place] [address]    Report Status PENDING  Incomplete      Radiology Studies last 3 days: DG Foot Complete Right Result Date: 05/11/2023 CLINICAL DATA:  Postop evaluation. EXAM: RIGHT FOOT COMPLETE - 3+ VIEW COMPARISON:  Right foot radiograph dated 05/07/2023. FINDINGS: Transmetatarsal amputation of the foot. No acute fracture or dislocation. Postsurgical changes in the soft tissues and cutaneous staples. Overlying dressing noted. IMPRESSION: Transmetatarsal amputation of the foot.  Electronically Signed   By: Elgie Collard M.D.   On: 05/11/2023 16:17        Sunnie Nielsen, DO [facility] 05/13/2023, 1:16 PM    Dictation software may have been used to generate the above note. Typos may occur and escape review in typed/dictated notes. Please contact Dr Lyn Hollingshead directly for clarity if needed.  Staff may message me via secure chat in Epic  but this may not receive an immediate response,  please page me for urgent matters!  If 7PM-7AM, please contact night coverage [email or web address]

## 2023-05-13 NOTE — H&P (View-Only) (Signed)
[facility]  Daily Progress Note   Subjective  -   Patient status post revascularization last week but foot remains poorly healing.  I have discussed the case with podiatry and the foot at this point is not really salvageable.  He and his family are coming to terms with this.  Objective Vitals:   05/12/23 1755 05/12/23 1800 05/12/23 1940 05/13/23 0746  BP:  125/81 133/77 (!) 140/81  Pulse: 85 86 83 85  Resp: 16 (!) 9 18 16   Temp:   98 F (36.7 C) 98.2 F (36.8 C)  TempSrc:    Oral  SpO2: 91% 93% 95% 95%  Weight:      Height:        Intake/Output Summary (Last 24 hours) at 05/13/2023 1333 Last data filed at 05/13/2023 1035 Gross per 24 hour  Intake 1080 ml  Output 1355 ml  Net -275 ml    PULM  CTAB CV  RRR VASC  right foot wound is dressed.  Pictures are in the chart.  Laboratory CBC    Component Value Date/Time   WBC 10.5 05/11/2023 0246   HGB 9.5 (L) 05/11/2023 0246   HCT 28.3 (L) 05/11/2023 0246   HCT 48.9 07/31/2015 0838   PLT 362 05/11/2023 0246    BMET    Component Value Date/Time   NA 132 (L) 05/11/2023 0246   K 3.6 05/11/2023 0246   CL 101 05/11/2023 0246   CO2 23 05/11/2023 0246   GLUCOSE 172 (H) 05/11/2023 0246   BUN 19 05/11/2023 0246   CREATININE 0.93 05/11/2023 0246   CALCIUM 7.6 (L) 05/11/2023 0246   GFRNONAA >60 05/11/2023 0246   GFRAA >60 04/21/2019 0515    Assessment/Planning: POD #4 s/p RLE revascularization  Have discussed the case with Dr. Alberteen Spindle as well as the family. At this point, he is going to need a below-knee amputation to get adequate control of infection and wound healing.  Attempts to transmetatarsal amputation have been done.  His foot does not appear salvageable.  He and his family are agreeable to below-knee amputation. I will plan to put this on the schedule for Thursday.  Festus Barren  05/13/2023, 1:33 PM

## 2023-05-13 NOTE — Plan of Care (Signed)

## 2023-05-13 NOTE — Plan of Care (Signed)

## 2023-05-14 DIAGNOSIS — L089 Local infection of the skin and subcutaneous tissue, unspecified: Secondary | ICD-10-CM | POA: Diagnosis not present

## 2023-05-14 DIAGNOSIS — E11628 Type 2 diabetes mellitus with other skin complications: Secondary | ICD-10-CM | POA: Diagnosis not present

## 2023-05-14 LAB — GLUCOSE, CAPILLARY
Glucose-Capillary: 107 mg/dL — ABNORMAL HIGH (ref 70–99)
Glucose-Capillary: 112 mg/dL — ABNORMAL HIGH (ref 70–99)
Glucose-Capillary: 159 mg/dL — ABNORMAL HIGH (ref 70–99)
Glucose-Capillary: 177 mg/dL — ABNORMAL HIGH (ref 70–99)
Glucose-Capillary: 222 mg/dL — ABNORMAL HIGH (ref 70–99)
Glucose-Capillary: 285 mg/dL — ABNORMAL HIGH (ref 70–99)

## 2023-05-14 LAB — BASIC METABOLIC PANEL WITH GFR
Anion gap: 8 (ref 5–15)
BUN: 12 mg/dL (ref 6–20)
CO2: 26 mmol/L (ref 22–32)
Calcium: 8 mg/dL — ABNORMAL LOW (ref 8.9–10.3)
Chloride: 103 mmol/L (ref 98–111)
Creatinine, Ser: 0.87 mg/dL (ref 0.61–1.24)
GFR, Estimated: >60 mL/min
Glucose, Bld: 113 mg/dL — ABNORMAL HIGH (ref 70–99)
Potassium: 3.1 mmol/L — ABNORMAL LOW (ref 3.5–5.1)
Sodium: 137 mmol/L (ref 135–145)

## 2023-05-14 LAB — CBC
HCT: 27.8 % — ABNORMAL LOW (ref 39.0–52.0)
Hemoglobin: 9.3 g/dL — ABNORMAL LOW (ref 13.0–17.0)
MCH: 28.4 pg (ref 26.0–34.0)
MCHC: 33.5 g/dL (ref 30.0–36.0)
MCV: 84.8 fL (ref 80.0–100.0)
Platelets: 383 10*3/uL (ref 150–400)
RBC: 3.28 MIL/uL — ABNORMAL LOW (ref 4.22–5.81)
RDW: 12.7 % (ref 11.5–15.5)
WBC: 9.2 10*3/uL (ref 4.0–10.5)
nRBC: 0 % (ref 0.0–0.2)

## 2023-05-14 MED ORDER — POTASSIUM CHLORIDE CRYS ER 20 MEQ PO TBCR
40.0000 meq | EXTENDED_RELEASE_TABLET | Freq: Once | ORAL | Status: AC
  Administered 2023-05-14: 40 meq via ORAL
  Filled 2023-05-14: qty 2

## 2023-05-14 NOTE — Progress Notes (Addendum)
PROGRESS NOTE    Gerald Ellis   GUY:[id number] DOB: [birth year]  DOA: 05/07/2023 Date of Service: 05/14/23 which is hospital day 7  PCP: Jerl Mina, MD    HPI: Gerald Ellis is a 57 y.o. male with medical history significant for HTN, HLD, CAD s/p stent, DM with neuropathy, chronic back pain on opiates,, anxiety, chronic ulcer first right MTP, followed by podiatry (last seen 02/18/2023), a with normal resting ABI at the time and treated with topical dressings who presents to the ED on 05/07/23 with a 1 week history of blistering in the area of the wound and then over the past day he noted that his second and third toes of the right foot were turning black.   Hospital course / significant events:  12/19: admitted to hospitalist service, started cefepime + vanc, podiatry and vascular consults, underwent transmetatarsal amputation R foot 12/20: underwent angiography RLE w/ stent placement  12/21: monitoring post-revascularization 12/22: podiatry reeval, planning to OR for washout/I&D tomorrow given sub-cutaneous gas on x-ray today. Wound Cx (+)mrsa, enterococcus  12/23: irrigation/debridement R foot, washout  12/24: foot not healing / not salvageable, planning BKA.  12/25: stable, await BKA tomorrow   Consultants:  Podiatry  Vascular Surgery   Procedures/Surgeries: 05/08/2023: Transmetatarsal amputation right foot - Dr Alberteen Spindle  05/09/2023: RLE angioplasty w/ stent to superficial femoral, popliteal, anterior tibial arteries - Dr. 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 Prasugrel  Debility, ambulatory dysfunction  HH PT/OT  Obesity Class 1 based on BMI: Body mass index is 33.72 kg/m.  Underweight - under 18  overweight - 25 to 29 obese - 30 or more Class 1 obesity: BMI of 30.0 to 34 Class 2 obesity: BMI of 35.0 to 39 Class 3 obesity: BMI of 40.0 to 49 Super Morbid Obesity: BMI 50-59 Super-super Morbid Obesity: BMI 60+ Significantly low or high BMI is associated with higher medical risk.  Weight management advised as adjunct to other disease management and risk reduction treatments    DVT prophylaxis: lovenox  IV fluids: no continuous IV fluids  Nutrition: diabetic diet  Central lines / invasive devices: none  Code Status: FULL CODE ACP documentation reviewed:  none on file in [facility]  TOC needs: BD< may need SNF rehab vs HH pending BKA Barriers to dispo / significant pending items: BKA Pending             Subjective / Brief ROS:  Patient reports no concerns at this time other than some pain RLE No fever/chills  Denies CP/SOB.  Pain controlled.  Tolerating diet  Denies new weakness.  Reports no concerns w/ urination/defecation.  No concerns for surgery tomorrow   Family Communication: family at bedside on rounds     Objective Findings:  Vitals:   05/13/23 0746 05/13/23 1700 05/14/23 0005 05/14/23 0816  BP: (!) 140/81 138/77 137/75 Marland Kitchen)  154/83  Pulse: 85 85 80 82  Resp: 16 18 20 16   Temp: 98.2 F (36.8 C) 99.5 F (37.5 C) 99.1 F (37.3 C) 98.4 F (36.9 C)  TempSrc: Oral Oral Oral   SpO2: 95% 97% 96% 94%  Weight:      Height:        Intake/Output Summary (Last 24 hours) at 05/14/2023 1311 Last data filed at 05/14/2023 1610 Gross per 24 hour  Intake --   Output 1000 ml  Net -1000 ml   Filed Weights   05/07/23 1918 05/12/23 1519  Weight: 106.6 kg 106.6 kg    Examination:  Physical Exam Constitutional:      General: He is not in acute distress.    Appearance: He is not ill-appearing.  Cardiovascular:     Rate and Rhythm: Normal rate and regular rhythm.  Pulmonary:     Effort: Pulmonary effort is normal.     Breath sounds: Normal breath sounds.  Neurological:     General: No focal deficit present.     Mental Status: He is alert and oriented to person, place, and time.  Psychiatric:        Mood and Affect: Mood normal.        Behavior: Behavior normal.          Scheduled Medications:   amLODipine  5 mg Oral Daily   aspirin  81 mg Oral Daily   atorvastatin  80 mg Oral q1800   busPIRone  7.5 mg Oral BID   enoxaparin (LOVENOX) injection  0.5 mg/kg Subcutaneous Daily   feeding supplement  237 mL Oral BID BM   insulin aspart  0-20 Units Subcutaneous Q4H   insulin aspart  3 Units Subcutaneous TID WC   insulin glargine-yfgn  38 Units Subcutaneous QHS   isosorbide mononitrate  30 mg Oral Daily   metoprolol succinate  50 mg Oral Daily   omega-3 acid ethyl esters  1 g Oral BID   pantoprazole  40 mg Oral BID   polyethylene glycol  17 g Oral Daily   potassium chloride  40 mEq Oral Once   prasugrel  10 mg Oral Daily   sodium chloride flush  3 mL Intravenous Q12H   sodium chloride flush  3-10 mL Intravenous Q12H    Continuous Infusions:  sodium chloride 20 mL/hr at 05/13/23 1548    ceFAZolin (ANCEF) IV     Vancomycin (VANCOCIN) 1,250 mg in sodium chloride 0.9 % 250 mL IVPB 1,250 mg (05/14/23 1220)    PRN Medications:  acetaminophen **OR** acetaminophen, cyclobenzaprine, HYDROcodone-acetaminophen, ipratropium-albuterol, magnesium hydroxide, morphine injection, ondansetron **OR** ondansetron (ZOFRAN) IV, phenol, sodium chloride flush, sodium chloride flush  Antimicrobials from admission:  Anti-infectives (From admission,  onward)    Start     Dose/Rate Route Frequency Ordered Stop   05/14/23 0000  Vancomycin (VANCOCIN) 1,250 mg in sodium chloride 0.9 % 250 mL IVPB        1,250 mg 166.7 mL/hr over 90 Minutes Intravenous Every 12 hours 05/13/23 1626     05/13/23 1442  ceFAZolin (ANCEF) IVPB 2g/100 mL premix        2 g 200 mL/hr over 30 Minutes Intravenous 30 min pre-op 05/13/23 1442     05/12/23 0000  vancomycin (VANCOREADY) IVPB 1250 mg/250 mL  Status:  Discontinued        1,250 mg 166.7 mL/hr over 90 Minutes Intravenous Every 12 hours 05/11/23 1345 05/14/23 0103   05/08/23 1000  vancomycin (VANCOCIN) IVPB 1000 mg/200  mL premix  Status:  Discontinued        1,000 mg 200 mL/hr over 60 Minutes Intravenous Every 12 hours 05/07/23 2251 05/11/23 1345   05/08/23 0400  ceFEPIme (MAXIPIME) 2 g in sodium chloride 0.9 % 100 mL IVPB  Status:  Discontinued        2 g 200 mL/hr over 30 Minutes Intravenous Every 8 hours 05/07/23 2252 05/13/23 0804   05/07/23 2230  ceFEPIme (MAXIPIME) 2 g in sodium chloride 0.9 % 100 mL IVPB  Status:  Discontinued        2 g 200 mL/hr over 30 Minutes Intravenous  Once 05/07/23 2222 05/07/23 2251   05/07/23 2230  metroNIDAZOLE (FLAGYL) IVPB 500 mg  Status:  Discontinued        500 mg 100 mL/hr over 60 Minutes Intravenous Every 12 hours 05/07/23 2222 05/08/23 0953   05/07/23 2230  vancomycin (VANCOCIN) IVPB 1000 mg/200 mL premix  Status:  Discontinued        1,000 mg 200 mL/hr over 60 Minutes Intravenous  Once 05/07/23 2222 05/07/23 2224   05/07/23 2230  Vancomycin (VANCOCIN) 1,500 mg in sodium chloride 0.9 % 500 mL IVPB        1,500 mg 250 mL/hr over 120 Minutes Intravenous  Once 05/07/23 2225 05/08/23 0312   05/07/23 2130  vancomycin (VANCOCIN) IVPB 1000 mg/200 mL premix        1,000 mg 200 mL/hr over 60 Minutes Intravenous  Once 05/07/23 2128 05/08/23 0114   05/07/23 2130  piperacillin-tazobactam (ZOSYN) IVPB 3.375 g        3.375 g 100 mL/hr over 30 Minutes Intravenous  Once  05/07/23 2128 05/07/23 2216           Data Reviewed:  I have personally reviewed the following...  CBC: Recent Labs  Lab 05/07/23 1921 05/08/23 0431 05/09/23 0428 05/10/23 0412 05/11/23 0246 05/14/23 0449  WBC 15.0* 12.8* 14.5* 12.9* 10.5 9.2  NEUTROABS 12.3*  --   --   --   --   --   HGB 12.1* 10.4* 10.6* 9.7* 9.5* 9.3*  HCT 36.3* 29.9* 30.4* 28.4* 28.3* 27.8*  MCV 87.1 83.3 83.1 84.3 86.5 84.8  PLT 491* 407* 414* 397 362 383   Basic Metabolic Panel: Recent Labs  Lab 05/08/23 0431 05/09/23 0428 05/10/23 0412 05/11/23 0246 05/14/23 0449  NA 133* 132* 133* 132* 137  K 3.5 3.7 3.5 3.6 3.1*  CL 100 100 102 101 103  CO2 22 23 25 23 26   GLUCOSE 205* 265* 216* 172* 113*  BUN 10 17 20 19 12   CREATININE 0.91 0.87 0.86 0.93 0.87  CALCIUM 7.9* 7.9* 7.8* 7.6* 8.0*   GFR: Estimated Creatinine Clearance: 115.9 mL/min (by C-G formula based on SCr of 0.87 mg/dL). Liver Function Tests: Recent Labs  Lab 05/07/23 1921  AST 36  ALT 41  ALKPHOS 97  BILITOT 0.9  PROT 8.3*  ALBUMIN 2.6*   No results for input(s): "LIPASE", "AMYLASE" in the last 168 hours. No results for input(s): "AMMONIA" in the last 168 hours. Coagulation Profile: No results for input(s): "INR", "PROTIME" in the last 168 hours. Cardiac Enzymes: No results for input(s): "CKTOTAL", "CKMB", "CKMBINDEX", "TROPONINI" in the last 168 hours. BNP (last 3 results) No results for input(s): "PROBNP" in the last 8760 hours. HbA1C: No results for input(s): "HGBA1C" in the last 72 hours. CBG: Recent Labs  Lab 05/13/23 1942 05/14/23 0000 05/14/23 0423 05/14/23 0819 05/14/23 1145  GLUCAP 219* 159* 112* 107*  177*   Lipid Profile: No results for input(s): "CHOL", "HDL", "LDLCALC", "TRIG", "CHOLHDL", "LDLDIRECT" in the last 72 hours. Thyroid Function Tests: No results for input(s): "TSH", "T4TOTAL", "FREET4", "T3FREE", "THYROIDAB" in the last 72 hours. Anemia Panel: No results for input(s): "VITAMINB12",  "FOLATE", "FERRITIN", "TIBC", "IRON", "RETICCTPCT" in the last 72 hours. Most Recent Urinalysis On File:     Component Value Date/Time   COLORURINE YELLOW (A) 06/12/2015 1252   APPEARANCEUR CLEAR (A) 06/12/2015 1252   LABSPEC 1.042 (H) 06/12/2015 1252   PHURINE 6.0 06/12/2015 1252   GLUCOSEU >500 (A) 06/12/2015 1252   HGBUR NEGATIVE 06/12/2015 1252   BILIRUBINUR NEGATIVE 06/12/2015 1252   KETONESUR TRACE (A) 06/12/2015 1252   PROTEINUR 100 (A) 06/12/2015 1252   NITRITE NEGATIVE 06/12/2015 1252   LEUKOCYTESUR NEGATIVE 06/12/2015 1252   Sepsis Labs: @LABRCNTIP (procalcitonin:4,lacticidven:4) Microbiology: Recent Results (from the past 240 hours)  Blood culture (single)     Status: None   Collection Time: 05/07/23  7:21 PM   Specimen: BLOOD  Result Value Ref Range Status   Specimen Description BLOOD LEFT FOREARM  Final   Special Requests   Final    BOTTLES DRAWN AEROBIC AND ANAEROBIC Blood Culture adequate volume   Culture   Final    NO GROWTH 5 DAYS Performed at [facility], [address] [place], [place] [address]    Report Status 05/12/2023 FINAL  Final  Surgical pcr screen     Status: Abnormal   Collection Time: 05/08/23  8:51 AM   Specimen: Nasal Mucosa; Nasal Swab  Result Value Ref Range Status   MRSA, PCR NEGATIVE NEGATIVE Final   Staphylococcus aureus POSITIVE (A) NEGATIVE Final    Comment: (NOTE) The Xpert SA Assay (FDA approved for NASAL specimens in patients 60 years of age and older), is one component of a comprehensive surveillance program. It is not intended to diagnose infection nor to guide or monitor treatment. Performed at [facility], [address] [place], [place] [address]   Aerobic/Anaerobic Culture w Gram Stain (surgical/deep wound)     Status: None   Collection Time: 05/08/23 11:54 AM   Specimen: Path Tissue  Result Value Ref Range Status   Specimen Description   Final    TISSUE Performed at [facility], [address], [place], [place] [address]    Special Requests SWAB OF RIGHT FOOT DEEP ABSCESS  Final   Gram Stain NO WBC SEEN FEW GRAM POSITIVE COCCI IN PAIRS   Final   Culture   Final    FEW ENTEROCOCCUS FAECALIS FEW METHICILLIN RESISTANT STAPHYLOCOCCUS AUREUS NO ANAEROBES ISOLATED Performed at [facility], [address] [place], [place] [address]    Report Status 05/13/2023 FINAL  Final   Organism ID, Bacteria ENTEROCOCCUS FAECALIS  Final   Organism ID, Bacteria METHICILLIN RESISTANT STAPHYLOCOCCUS AUREUS  Final      Susceptibility   Enterococcus faecalis - MIC*    AMPICILLIN <=2 SENSITIVE Sensitive     VANCOMYCIN 2 SENSITIVE Sensitive     GENTAMICIN SYNERGY SENSITIVE Sensitive     * FEW ENTEROCOCCUS FAECALIS   Methicillin resistant staphylococcus aureus - MIC*    CIPROFLOXACIN 2 INTERMEDIATE Intermediate     ERYTHROMYCIN >=8 RESISTANT Resistant     GENTAMICIN <=0.5 SENSITIVE Sensitive     OXACILLIN RESISTANT Resistant     TETRACYCLINE <=1 SENSITIVE Sensitive     VANCOMYCIN <=0.5 SENSITIVE Sensitive     TRIMETH/SULFA <=10 SENSITIVE Sensitive  CLINDAMYCIN 2 INTERMEDIATE Intermediate     RIFAMPIN <=0.5 SENSITIVE Sensitive     Inducible Clindamycin NEGATIVE Sensitive     LINEZOLID 2 SENSITIVE Sensitive     * FEW METHICILLIN RESISTANT STAPHYLOCOCCUS AUREUS  Aerobic/Anaerobic Culture w Gram Stain (surgical/deep wound)     Status: None (Preliminary result)   Collection Time: 05/12/23  4:29 PM   Specimen: Path Tissue  Result Value Ref Range Status   Specimen Description   Final    TISSUE Performed at [facility], [address] [place], [place] [address]    Special Requests   Final    NONE Performed at [facility], [address] [place], [place] [address]    Gram Stain   Final    RARE WBC PRESENT,BOTH PMN AND MONONUCLEAR NO ORGANISMS SEEN    Culture   Final    CULTURE REINCUBATED FOR BETTER GROWTH Performed at [facility],  [address], [place], [place] [address]    Report Status PENDING  Incomplete      Radiology Studies last 3 days: DG Foot Complete Right Result Date: 05/13/2023 CLINICAL DATA:  Postop check status post transmetatarsal amputation and debridement of the mid calf. EXAM: RIGHT FOOT COMPLETE - 3+ VIEW; RIGHT TIBIA AND FIBULA - 2 VIEW COMPARISON:  Foot radiographs 05/11/2023 and 05/07/2023. MRI 05/07/2023. FINDINGS: Right tibia and fibula: The mineralization and alignment are normal. There is no evidence of acute fracture or dislocation. The joint spaces appear preserved at the knee. There are vascular stents posterior to the distal right femur and within the popliteal fossa. No focal soft tissue abnormalities are identified allowing for artifact associated with overlying sheets or clothing. Right foot: Status post transmetatarsal amputation through the bases of all the metatarsals, unchanged from the prior study of 2 days earlier. No progressive bone destruction. A small amount of gas is present within the distal soft tissues, nonspecific. Skin staples are in place. IMPRESSION: 1. Stable postoperative appearance of the right foot status post transmetatarsal amputation. No evidence of progressive bone destruction. 2. No acute osseous findings in the right lower leg. Electronically Signed   By: Carey Bullocks M.D.   On: 05/13/2023 13:52   DG Tibia/Fibula Right Result Date: 05/13/2023 CLINICAL DATA:  Postop check status post transmetatarsal amputation and debridement of the mid calf. EXAM: RIGHT FOOT COMPLETE - 3+ VIEW; RIGHT TIBIA AND FIBULA - 2 VIEW COMPARISON:  Foot radiographs 05/11/2023 and 05/07/2023. MRI 05/07/2023. FINDINGS: Right tibia and fibula: The mineralization and alignment are normal. There is no evidence of acute fracture or dislocation. The joint spaces appear preserved at the knee. There are vascular stents posterior to the distal right femur and within the popliteal fossa. No focal soft tissue  abnormalities are identified allowing for artifact associated with overlying sheets or clothing. Right foot: Status post transmetatarsal amputation through the bases of all the metatarsals, unchanged from the prior study of 2 days earlier. No progressive bone destruction. A small amount of gas is present within the distal soft tissues, nonspecific. Skin staples are in place. IMPRESSION: 1. Stable postoperative appearance of the right foot status post transmetatarsal amputation. No evidence of progressive bone destruction. 2. No acute osseous findings in the right lower leg. Electronically Signed   By: Carey Bullocks M.D.   On: 05/13/2023 13:52   DG Foot Complete Right Result Date: 05/11/2023 CLINICAL DATA:  Postop evaluation. EXAM: RIGHT FOOT COMPLETE - 3+ VIEW COMPARISON:  Right foot radiograph dated 05/07/2023. FINDINGS:  Transmetatarsal amputation of the foot. No acute fracture or dislocation. Postsurgical changes in the soft tissues and cutaneous staples. Overlying dressing noted. IMPRESSION: Transmetatarsal amputation of the foot. Electronically Signed   By: Elgie Collard M.D.   On: 05/11/2023 16:17        Sunnie Nielsen, DO [facility] 05/14/2023, 1:11 PM    Dictation software may have been used to generate the above note. Typos may occur and escape review in typed/dictated notes. Please contact Dr Lyn Hollingshead directly for clarity if needed.  Staff may message me via secure chat in Epic  but this may not receive an immediate response,  please page me for urgent matters!  If 7PM-7AM, please contact night coverage [email or web address]

## 2023-05-14 NOTE — Plan of Care (Signed)

## 2023-05-14 NOTE — Plan of Care (Signed)

## 2023-05-15 ENCOUNTER — Inpatient Hospital Stay: Payer: PPO | Admitting: Certified Registered Nurse Anesthetist

## 2023-05-15 ENCOUNTER — Other Ambulatory Visit: Payer: Self-pay

## 2023-05-15 ENCOUNTER — Encounter
Admission: EM | Disposition: A | Discharge status: Skilled Nursing Facility | Payer: Self-pay | Source: Home / Self Care | Attending: Osteopathic Medicine

## 2023-05-15 ENCOUNTER — Encounter: Payer: Self-pay | Admitting: Internal Medicine

## 2023-05-15 DIAGNOSIS — L089 Local infection of the skin and subcutaneous tissue, unspecified: Secondary | ICD-10-CM | POA: Diagnosis not present

## 2023-05-15 DIAGNOSIS — E11628 Type 2 diabetes mellitus with other skin complications: Secondary | ICD-10-CM | POA: Diagnosis not present

## 2023-05-15 HISTORY — PX: AMPUTATION: SHX166

## 2023-05-15 LAB — GLUCOSE, CAPILLARY
Glucose-Capillary: 108 mg/dL — ABNORMAL HIGH (ref 70–99)
Glucose-Capillary: 114 mg/dL — ABNORMAL HIGH (ref 70–99)
Glucose-Capillary: 132 mg/dL — ABNORMAL HIGH (ref 70–99)
Glucose-Capillary: 189 mg/dL — ABNORMAL HIGH (ref 70–99)
Glucose-Capillary: 201 mg/dL — ABNORMAL HIGH (ref 70–99)
Glucose-Capillary: 247 mg/dL — ABNORMAL HIGH (ref 70–99)

## 2023-05-15 LAB — BASIC METABOLIC PANEL
Anion gap: 8 (ref 5–15)
BUN: 14 mg/dL (ref 6–20)
CO2: 25 mmol/L (ref 22–32)
Calcium: 7.8 mg/dL — ABNORMAL LOW (ref 8.9–10.3)
Chloride: 99 mmol/L (ref 98–111)
Creatinine, Ser: 0.79 mg/dL (ref 0.61–1.24)
GFR, Estimated: >60 mL/min (ref >60)
Glucose, Bld: 218 mg/dL — ABNORMAL HIGH (ref 70–99)
Potassium: 3.7 mmol/L (ref 3.5–5.1)
Sodium: 132 mmol/L — ABNORMAL LOW (ref 135–145)

## 2023-05-15 LAB — CBC
HCT: 28.5 % — ABNORMAL LOW (ref 39.0–52.0)
Hemoglobin: 9.4 g/dL — ABNORMAL LOW (ref 13.0–17.0)
MCH: 28.8 pg (ref 26.0–34.0)
MCHC: 33 g/dL (ref 30.0–36.0)
MCV: 87.4 fL (ref 80.0–100.0)
Platelets: 339 10*3/uL (ref 150–400)
RBC: 3.26 MIL/uL — ABNORMAL LOW (ref 4.22–5.81)
RDW: 12.7 % (ref 11.5–15.5)
WBC: 8.2 10*3/uL (ref 4.0–10.5)
nRBC: 0 % (ref 0.0–0.2)

## 2023-05-15 SURGERY — AMPUTATION BELOW KNEE
Anesthesia: General | Site: Knee | Laterality: Right

## 2023-05-15 MED ORDER — MORPHINE SULFATE (PF) 2 MG/ML IV SOLN
2.0000 mg | INTRAVENOUS | Status: AC | PRN
  Administered 2023-05-15 – 2023-05-16 (×3): 2 mg via INTRAVENOUS
  Filled 2023-05-15 (×3): qty 1

## 2023-05-15 MED ORDER — VANCOMYCIN HCL 1250 MG/250ML IV SOLN
1250.0000 mg | Freq: Two times a day (BID) | INTRAVENOUS | Status: AC
  Administered 2023-05-16 – 2023-05-17 (×4): 1250 mg via INTRAVENOUS
  Filled 2023-05-15 (×5): qty 250

## 2023-05-15 MED ORDER — EPHEDRINE SULFATE-NACL 50-0.9 MG/10ML-% IV SOSY
PREFILLED_SYRINGE | INTRAVENOUS | Status: DC | PRN
  Administered 2023-05-15 (×3): 5 mg via INTRAVENOUS

## 2023-05-15 MED ORDER — CEFAZOLIN SODIUM-DEXTROSE 2-4 GM/100ML-% IV SOLN
INTRAVENOUS | Status: AC
  Filled 2023-05-15: qty 100

## 2023-05-15 MED ORDER — GLYCOPYRROLATE 0.2 MG/ML IJ SOLN
INTRAMUSCULAR | Status: DC | PRN
  Administered 2023-05-15: .2 mg via INTRAVENOUS

## 2023-05-15 MED ORDER — GLYCOPYRROLATE 0.2 MG/ML IJ SOLN
INTRAMUSCULAR | Status: AC
  Filled 2023-05-15: qty 1

## 2023-05-15 MED ORDER — HYDROMORPHONE HCL 1 MG/ML IJ SOLN
INTRAMUSCULAR | Status: AC
  Filled 2023-05-15: qty 1

## 2023-05-15 MED ORDER — OXYCODONE HCL 5 MG PO TABS
5.0000 mg | ORAL_TABLET | Freq: Once | ORAL | Status: AC | PRN
  Administered 2023-05-15: 5 mg via ORAL

## 2023-05-15 MED ORDER — FENTANYL CITRATE (PF) 100 MCG/2ML IJ SOLN
INTRAMUSCULAR | Status: AC
  Filled 2023-05-15: qty 2

## 2023-05-15 MED ORDER — LIDOCAINE HCL (CARDIAC) PF 100 MG/5ML IV SOSY
PREFILLED_SYRINGE | INTRAVENOUS | Status: DC | PRN
  Administered 2023-05-15: 100 mg via INTRAVENOUS

## 2023-05-15 MED ORDER — MIDAZOLAM HCL 2 MG/2ML IJ SOLN
INTRAMUSCULAR | Status: DC | PRN
  Administered 2023-05-15: 2 mg via INTRAVENOUS

## 2023-05-15 MED ORDER — PHENYLEPHRINE 80 MCG/ML (10ML) SYRINGE FOR IV PUSH (FOR BLOOD PRESSURE SUPPORT)
PREFILLED_SYRINGE | INTRAVENOUS | Status: DC | PRN
  Administered 2023-05-15 (×3): 80 ug via INTRAVENOUS
  Administered 2023-05-15: 160 ug via INTRAVENOUS
  Administered 2023-05-15 (×2): 80 ug via INTRAVENOUS

## 2023-05-15 MED ORDER — ONDANSETRON HCL 4 MG/2ML IJ SOLN: 4.0000 mg | Freq: Once | INTRAMUSCULAR | Status: DC | PRN

## 2023-05-15 MED ORDER — OXYCODONE HCL 5 MG/5ML PO SOLN: 5.0000 mg | Freq: Once | ORAL | Status: AC | PRN

## 2023-05-15 MED ORDER — ONDANSETRON HCL 4 MG/2ML IJ SOLN
INTRAMUSCULAR | Status: DC | PRN
  Administered 2023-05-15: 4 mg via INTRAVENOUS

## 2023-05-15 MED ORDER — HYDROMORPHONE HCL 1 MG/ML IJ SOLN
INTRAMUSCULAR | Status: DC | PRN
  Administered 2023-05-15 (×2): .5 mg via INTRAVENOUS

## 2023-05-15 MED ORDER — PHENYLEPHRINE 80 MCG/ML (10ML) SYRINGE FOR IV PUSH (FOR BLOOD PRESSURE SUPPORT)
PREFILLED_SYRINGE | INTRAVENOUS | Status: AC
  Filled 2023-05-15: qty 10

## 2023-05-15 MED ORDER — PROPOFOL 10 MG/ML IV BOLUS
INTRAVENOUS | Status: DC | PRN
  Administered 2023-05-15: 200 mg via INTRAVENOUS

## 2023-05-15 MED ORDER — FENTANYL CITRATE (PF) 100 MCG/2ML IJ SOLN
25.0000 ug | INTRAMUSCULAR | Status: DC | PRN
  Administered 2023-05-15 (×4): 25 ug via INTRAVENOUS

## 2023-05-15 MED ORDER — OXYCODONE HCL 5 MG PO TABS
ORAL_TABLET | ORAL | Status: AC
  Filled 2023-05-15: qty 1

## 2023-05-15 MED ORDER — 0.9 % SODIUM CHLORIDE (POUR BTL) OPTIME
TOPICAL | Status: DC | PRN
  Administered 2023-05-15: 1000 mL

## 2023-05-15 MED ORDER — EPHEDRINE 5 MG/ML INJ
INTRAVENOUS | Status: AC
  Filled 2023-05-15: qty 5

## 2023-05-15 MED ORDER — HYDROMORPHONE HCL 1 MG/ML IJ SOLN
0.5000 mg | INTRAMUSCULAR | Status: AC | PRN
Start: 2023-05-15 — End: 2023-05-15
  Administered 2023-05-15 (×4): 0.5 mg via INTRAVENOUS

## 2023-05-15 MED ORDER — HYDROCODONE-ACETAMINOPHEN 5-325 MG PO TABS
2.0000 | ORAL_TABLET | ORAL | Status: AC
  Administered 2023-05-15 – 2023-05-16 (×5): 2 via ORAL
  Filled 2023-05-15 (×5): qty 2

## 2023-05-15 MED ORDER — MORPHINE SULFATE (PF) 2 MG/ML IV SOLN
2.0000 mg | Freq: Once | INTRAVENOUS | Status: AC
Start: 2023-05-15 — End: 2023-05-15
  Administered 2023-05-15: 2 mg via INTRAVENOUS
  Filled 2023-05-15: qty 1

## 2023-05-15 MED ORDER — MIDAZOLAM HCL 2 MG/2ML IJ SOLN
INTRAMUSCULAR | Status: AC
Start: 2023-05-15 — End: ?
  Filled 2023-05-15: qty 2

## 2023-05-15 MED ORDER — FENTANYL CITRATE (PF) 100 MCG/2ML IJ SOLN
INTRAMUSCULAR | Status: DC | PRN
  Administered 2023-05-15 (×2): 25 ug via INTRAVENOUS
  Administered 2023-05-15: 50 ug via INTRAVENOUS
  Administered 2023-05-15 (×2): 25 ug via INTRAVENOUS

## 2023-05-15 SURGICAL SUPPLY — 35 items
BLADE SAGITTAL WIDE XTHICK NO (BLADE) IMPLANT
BLADE SAW SAG 25.4X90 (BLADE) ×1 IMPLANT
BNDG COHESIVE 4X5 TAN STRL LF (GAUZE/BANDAGES/DRESSINGS) ×1 IMPLANT
BNDG ELASTIC 6X5.8 VLCR NS LF (GAUZE/BANDAGES/DRESSINGS) ×1 IMPLANT
BNDG GAUZE DERMACEA FLUFF 4 (GAUZE/BANDAGES/DRESSINGS) ×2 IMPLANT
BRUSH SCRUB EZ 4% CHG (MISCELLANEOUS) ×1 IMPLANT
CHLORAPREP W/TINT 26 (MISCELLANEOUS) ×1 IMPLANT
DRAPE INCISE IOBAN 66X45 STRL (DRAPES) IMPLANT
ELECT CAUTERY BLADE 6.4 (BLADE) ×1 IMPLANT
ELECT REM PT RETURN 9FT ADLT (ELECTROSURGICAL) ×1
ELECTRODE REM PT RTRN 9FT ADLT (ELECTROSURGICAL) ×1 IMPLANT
GAUZE SPONGE 4X4 12PLY STRL (GAUZE/BANDAGES/DRESSINGS) IMPLANT
GAUZE XEROFORM 1X8 LF (GAUZE/BANDAGES/DRESSINGS) ×2 IMPLANT
GLOVE BIO SURGEON STRL SZ7 (GLOVE) ×2 IMPLANT
GOWN STRL REUS W/ TWL LRG LVL3 (GOWN DISPOSABLE) ×2 IMPLANT
GOWN STRL REUS W/TWL 2XL LVL3 (GOWN DISPOSABLE) ×1 IMPLANT
HANDLE YANKAUER SUCT BULB TIP (MISCELLANEOUS) ×1 IMPLANT
KIT TURNOVER KIT A (KITS) ×1 IMPLANT
LABEL OR SOLS (LABEL) ×1 IMPLANT
MANIFOLD NEPTUNE II (INSTRUMENTS) ×1 IMPLANT
MAT ABSORB FLUID 56X50 GRAY (MISCELLANEOUS) ×1 IMPLANT
NS IRRIG 1000ML POUR BTL (IV SOLUTION) ×1 IMPLANT
PACK EXTREMITY ARMC (MISCELLANEOUS) ×1 IMPLANT
PAD ABD DERMACEA PRESS 5X9 (GAUZE/BANDAGES/DRESSINGS) ×2 IMPLANT
PAD PREP OB/GYN DISP 24X41 (PERSONAL CARE ITEMS) ×1 IMPLANT
SPONGE T-LAP 18X18 ~~LOC~~+RFID (SPONGE) ×1 IMPLANT
STAPLER SKIN PROX 35W (STAPLE) ×1 IMPLANT
STOCKINETTE M/LG 89821 (MISCELLANEOUS) ×1 IMPLANT
SUT SILK 2 0 SH (SUTURE) ×2 IMPLANT
SUT SILK 2-0 18XBRD TIE 12 (SUTURE) ×1 IMPLANT
SUT SILK 3-0 18XBRD TIE 12 (SUTURE) ×1 IMPLANT
SUT VIC AB 0 CT1 36 (SUTURE) ×2 IMPLANT
SUT VIC AB 2-0 CT1 (SUTURE) ×2 IMPLANT
TRAP FLUID SMOKE EVACUATOR (MISCELLANEOUS) ×1 IMPLANT
WATER STERILE IRR 500ML POUR (IV SOLUTION) ×1 IMPLANT

## 2023-05-15 NOTE — Op Note (Signed)
OPERATIVE NOTE   PROCEDURE: Right below-the-knee amputation  PRE-OPERATIVE DIAGNOSIS: Right foot osteomyelitis, failed right TMA  POST-OPERATIVE DIAGNOSIS: same as above  SURGEON: Festus Barren, MD  ASSISTANT(S): Rolla Plate, NP  ANESTHESIA: general  ESTIMATED BLOOD LOSS: 150 cc  FINDING(S): none  SPECIMEN(S):  Right below-the-knee amputation  INDICATIONS:   Gerald Ellis is a 57 y.o. 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  I discussed in depth with the patient the risks, benefits, and alternatives to this procedure.  The patient is aware that the risk of this operation included but are not limited to:  bleeding, infection, myocardial infarction, stroke, death, failure to heal amputation wound, and possible need for more proximal amputation.  The patient is aware of the risks and agrees proceed forward with the procedure. An assistant was present during the procedure to help facilitate the exposure and expedite the procedure.   DESCRIPTION:  After full informed written consent was obtained from the patient, the patient was brought back to the operating room, and placed supine upon the operating table.  Prior to induction, the patient received IV antibiotics.  The patient was then prepped and draped in the standard fashion for a below-the-knee amputation.  After obtaining adequate anesthesia, the patient was prepped and draped in the standard fashion for a right below-the-knee amputation.  I marked out the anterior incision two finger breadths below the tibial tuberosity and then the marked out a posterior flap that was one third of the circumference of the calf in length. The assistant provided retraction and mobilization to help facilitate exposure and expedite the procedure throughout the entire procedure.  This included following suture, using retractors, and optimizing  lighting.   I made the incisions for these flaps, and then dissected through the subcutaneous tissue, fascia, and muscle anteriorly.  I elevated  the periosteal tissue superiorly so that the tibia was about 3-4 cm shorter than the anterior skin flap.  I then transected the tibia with a power saw and then took a wedge off the tibia anteriorly with the power saw.  Then I smoothed out the rough edges.  In a similar fashion, I cut back the fibula about two centimeters higher than the level of the tibia with a bone cutter.  I put a bone hook into the distal tibia and then used a large amputation knife to sharply develop a tissue plane through the muscle along the fibula.  In such fashion, the posterior flap was developed.  At this point, the specimen was passed off the field as the below-the-knee amputation.  At this point, I clamped all visibly bleeding arteries and veins using a combination of suture ligation with Silk suture and electrocautery.  Bleeding continued to be controlled with electrocautery and suture ligature.  The stump was washed off with sterile normal saline and no further active bleeding was noted.  I reapproximated the anterior and posterior fascia  with interrupted stitches of 0 Vicryl.  This was completed along the entire length of anterior and posterior fascia until there were no more loose space in the fascial line. I then placed a layer of 2-0 Vicryl sutures in the subcutaneous tissue. The skin was then  reapproximated with staples.  The stump was washed off and dried.  The incision was dressed with Xeroform and  then fluffs were applied.  Kerlix was wrapped around the leg and then  gently an ACE wrap was applied.    COMPLICATIONS: none  CONDITION: stable   Festus Barren  05/15/2023, 9:54 AM    This note was created with Dragon Medical transcription system. Any errors in dictation are purely unintentional.

## 2023-05-15 NOTE — Anesthesia Preprocedure Evaluation (Signed)
Anesthesia Evaluation  Patient identified by MRN, date of birth, ID band Patient awake    Reviewed: Allergy & Precautions, NPO status , Patient's Chart, lab work & pertinent test results  Airway Mallampati: II  TM Distance: >3 FB Neck ROM: full    Dental  (+) Missing, Poor Dentition, Dental Advisory Given   Pulmonary neg pulmonary ROS   Pulmonary exam normal breath sounds clear to auscultation       Cardiovascular Exercise Tolerance: Good hypertension, Pt. on medications + CAD, + Cardiac Stents and + Peripheral Vascular Disease  negative cardio ROS Normal cardiovascular exam Rhythm:Regular Rate:Normal     Neuro/Psych  Headaches  Anxiety Depression    negative neurological ROS  negative psych ROS   GI/Hepatic negative GI ROS, Neg liver ROS,,,  Endo/Other  negative endocrine ROSdiabetes, Poorly Controlled, Type 1, Insulin Dependent  Class 3 obesity  Renal/GU negative Renal ROS  negative genitourinary   Musculoskeletal  (+) Arthritis ,    Abdominal Normal abdominal exam  (+)   Peds negative pediatric ROS (+)  Hematology negative hematology ROS (+)   Anesthesia Other Findings Past Medical History: No date: Benign enlargement of prostate No date: Bronchitis No date: Childhood asthma No date: Diabetes mellitus without complication (HCC) No date: Environmental allergies No date: Erectile dysfunction No date: Headache No date: Hemorrhoids No date: Hypercholesteremia No date: Hypertension No date: Hypogonadism in male No date: Lumbago No date: Over weight  Past Surgical History: 1992: BACK SURGERY No date: CHOLECYSTECTOMY 08/07/2015: COLONOSCOPY WITH PROPOFOL; N/A     Comment:  Procedure: COLONOSCOPY WITH PROPOFOL;  Surgeon: Christena Deem, MD;  Location: [facility] ENDOSCOPY;  Service:               Endoscopy;  Laterality: N/A; 08/08/2015: COLONOSCOPY WITH PROPOFOL; N/A     Comment:  Procedure:  COLONOSCOPY WITH PROPOFOL;  Surgeon: Christena Deem, MD;  Location: [facility] ENDOSCOPY;  Service:               Endoscopy;  Laterality: N/A; 09/16/2017: COLONOSCOPY WITH PROPOFOL; N/A     Comment:  Procedure: COLONOSCOPY WITH PROPOFOL;  Surgeon:               Christena Deem, MD;  Location: [facility] ENDOSCOPY;                Service: Endoscopy;  Laterality: N/A; 12/20/2020: COLONOSCOPY WITH PROPOFOL; N/A     Comment:  Procedure: COLONOSCOPY WITH PROPOFOL;  Surgeon: Earline Mayotte, MD;  Location: [facility] ENDOSCOPY;  Service:               Endoscopy;  Laterality: N/A;  IDDM 04/20/2019: CORONARY STENT INTERVENTION; N/A     Comment:  Procedure: CORONARY STENT INTERVENTION;  Surgeon:               Marcina Millard, MD;  Location: [facility] INVASIVE CV               LAB;  Service: Cardiovascular;  Laterality: N/A; 08/07/2015: ESOPHAGOGASTRODUODENOSCOPY (EGD) WITH PROPOFOL; N/A     Comment:  Procedure: ESOPHAGOGASTRODUODENOSCOPY (EGD) WITH               PROPOFOL;  Surgeon: Christena Deem, MD;  Location:  [facility] ENDOSCOPY;  Service: Endoscopy;  Laterality: N/A; 09/16/2017: ESOPHAGOGASTRODUODENOSCOPY (EGD) WITH PROPOFOL; N/A     Comment:  Procedure: ESOPHAGOGASTRODUODENOSCOPY (EGD) WITH               PROPOFOL;  Surgeon: Christena Deem, MD;  Location:               [facility] ENDOSCOPY;  Service: Endoscopy;  Laterality: N/A; 12/20/2020: ESOPHAGOGASTRODUODENOSCOPY (EGD) WITH PROPOFOL; N/A     Comment:  Procedure: ESOPHAGOGASTRODUODENOSCOPY (EGD) WITH               PROPOFOL;  Surgeon: Earline Mayotte, MD;  Location:               [facility] ENDOSCOPY;  Service: Endoscopy;  Laterality: N/A; 05/12/2023: IRRIGATION AND DEBRIDEMENT FOOT; Right     Comment:  Procedure: IRRIGATION AND DEBRIDEMENT FOOT, WASH OUT;                Surgeon: Linus Galas, DPM;  Location: [facility] ORS;  Service:              Orthopedics/Podiatry;  Laterality: Right; 04/20/2019: LEFT HEART CATH AND CORONARY  ANGIOGRAPHY; Left     Comment:  Procedure: LEFT HEART CATH AND CORONARY ANGIOGRAPHY;                Surgeon: Marcina Millard, MD;  Location: [facility]               INVASIVE CV LAB;  Service: Cardiovascular;  Laterality:               Left; 05/09/2023: LOWER EXTREMITY ANGIOGRAPHY; Right     Comment:  Procedure: Lower Extremity Angiography;  Surgeon:               Renford Dills, MD;  Location: [facility] INVASIVE CV LAB;               Service: Cardiovascular;  Laterality: Right; 05/08/2023: TRANSMETATARSAL AMPUTATION; Right     Comment:  Procedure: TRANSMETATARSAL AMPUTATION;  Surgeon: Linus Galas, DPM;  Location: [facility] ORS;  Service:               Orthopedics/Podiatry;  Laterality: Right;  BMI    Body Mass Index: 33.72 kg/m      Reproductive/Obstetrics negative OB ROS                             Anesthesia Physical Anesthesia Plan  ASA: 3  Anesthesia Plan: General   Post-op Pain Management:    Induction: Intravenous  PONV Risk Score and Plan: Dexamethasone, Ondansetron, Midazolam and Treatment may vary due to age or medical condition  Airway Management Planned: LMA  Additional Equipment:   Intra-op Plan:   Post-operative Plan: Extubation in OR  Informed Consent: I have reviewed the patients History and Physical, chart, labs and discussed the procedure including the risks, benefits and alternatives for the proposed anesthesia with the patient or authorized representative who has indicated his/her understanding and acceptance.     Dental Advisory Given  Plan Discussed with: CRNA  Anesthesia Plan Comments:        Anesthesia Quick Evaluation

## 2023-05-15 NOTE — Progress Notes (Signed)
Riley Churches, LPN asked this RN to assess with her patient's stump site as it had a fresh, small blood stain on the ace wrap. Site was marked and time stamped, and this RN went to get the ROVER device to take a photo of the site for the chart and when I returned to take photo (stamped at 2258 in media), proximal site of ace wrap was completely saturated in blood. Dr. Wyn Quaker was contacted who stated no interventions at this time.   Expressed consent obtained from patient to take photo on ROVER device for placement in electronic medical record only. Noted photo will not be stored on this device.

## 2023-05-15 NOTE — Progress Notes (Signed)
PROGRESS NOTE    Gerald Ellis   NWG:[id number] DOB: 11/18/1965  DOA: 05/07/2023 Date of Service: 05/15/23 which is hospital day 8  PCP: Jerl Mina, MD    HPI: Gerald Ellis is a 57 y.o. male with medical history significant for HTN, HLD, CAD s/p stent, DM with neuropathy, chronic back pain on opiates,, anxiety, chronic ulcer first right MTP, followed by podiatry (last seen 02/18/2023), a with normal resting ABI at the time and treated with topical dressings who presents to the ED on 05/07/23 with a 1 week history of blistering in the area of the wound and then over the past day he noted that his second and third toes of the right foot were turning black.   Hospital course / significant events:  12/19: admitted to hospitalist service, started cefepime + vanc, podiatry and vascular consults, underwent transmetatarsal amputation R foot 12/20: underwent angiography RLE w/ stent placement  12/21: monitoring post-revascularization 12/22: podiatry reeval, planning to OR for washout/I&D tomorrow given sub-cutaneous gas on x-ray today. Wound Cx (+)mrsa, enterococcus  12/23: irrigation/debridement R foot, washout  12/24: foot not healing / not salvageable, planning BKA.  12/25: stable, await BKA tomorrow  12/26: BKA  Consultants:  Podiatry  Vascular Surgery   Procedures/Surgeries: 05/08/2023: Transmetatarsal amputation right foot - Dr Alberteen Spindle  05/09/2023: RLE angioplasty w/ stent to superficial femoral, popliteal, anterior tibial arteries - Dr. 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 Prasugrel  Debility, ambulatory dysfunction  HH PT/OT  Obesity Class 1 based on BMI: Body mass index is 33.72 kg/m.  Underweight - under 18  overweight - 25 to 29 obese - 30 or more Class 1 obesity: BMI of 30.0 to 34 Class 2 obesity: BMI of 35.0 to 39 Class 3 obesity: BMI of 40.0 to 49 Super Morbid Obesity: BMI 50-59 Super-super Morbid Obesity: BMI 60+ Significantly low or high BMI is associated with higher medical risk.  Weight management advised as adjunct to other disease management and risk reduction treatments    DVT prophylaxis: lovenox  IV fluids: no continuous IV fluids  Nutrition: diabetic diet  Central lines / invasive devices: none  Code Status: FULL CODE ACP documentation reviewed:  none on file in [facility]  TOC needs: pend PT/OT reeval, may need SNF/rehab now that has BKA  Barriers to dispo / significant pending items: BKA today, PT/OT tomorrow              Subjective / Brief ROS:  Patient reports no concerns at this time  Examined in hospital room, postop  No fever/chills  Denies CP/SOB.  Pain controlled, RLE very sore postop  Denies new weakness.   Family Communication: family at bedside on rounds     Objective Findings:  Vitals:   05/15/23 1100 05/15/23 1115 05/15/23 1130 05/15/23 1150  BP: (!) 161/81 (!) 153/85 (!) 146/80 (!) 146/84  Pulse: 94 94 (!)  139 95  Resp: 14 19 (!) 9 17  Temp:    98.8 F (37.1 C)  TempSrc:      SpO2: 92% 92% 96% 94%  Weight:      Height:        Intake/Output Summary (Last 24 hours) at 05/15/2023 1310 Last data filed at 05/15/2023 1100 Gross per 24 hour  Intake 1119.97 ml  Output 1550 ml  Net -430.03 ml   Filed Weights   05/07/23 1918 05/12/23 1519 05/15/23 0806  Weight: 106.6  kg 106.6 kg 106.6 kg    Examination:  Physical Exam Constitutional:      General: He is not in acute distress.    Appearance: He is not ill-appearing.  Cardiovascular:     Rate and Rhythm: Normal rate and regular rhythm.  Pulmonary:     Effort: Pulmonary effort is normal.     Breath sounds: Normal breath sounds.  Neurological:     General: No focal deficit present.     Mental Status: He is alert and oriented to person, place, and time.  Psychiatric:        Mood and Affect: Mood normal.        Behavior: Behavior normal.          Scheduled Medications:   amLODipine  5 mg Oral Daily   aspirin  81 mg Oral Daily   atorvastatin  80 mg Oral q1800   busPIRone  7.5 mg Oral BID   enoxaparin (LOVENOX) injection  0.5 mg/kg Subcutaneous Daily   feeding supplement  237 mL Oral BID BM   insulin aspart  0-20 Units Subcutaneous Q4H   insulin aspart  3 Units Subcutaneous TID WC   insulin glargine-yfgn  38 Units Subcutaneous QHS   isosorbide mononitrate  30 mg Oral Daily   metoprolol succinate  50 mg Oral Daily   omega-3 acid ethyl esters  1 g Oral BID   pantoprazole  40 mg Oral BID   polyethylene glycol  17 g Oral Daily   prasugrel  10 mg Oral Daily   sodium chloride flush  3 mL Intravenous Q12H   sodium chloride flush  3-10 mL Intravenous Q12H    Continuous Infusions:  Vancomycin (VANCOCIN) 1,250 mg in sodium chloride 0.9 % 250 mL IVPB 1,250 mg (05/15/23 1216)    PRN Medications:  acetaminophen **OR** acetaminophen, cyclobenzaprine, HYDROcodone-acetaminophen, ipratropium-albuterol, magnesium hydroxide, morphine injection, ondansetron **OR** ondansetron (ZOFRAN) IV, phenol, sodium chloride flush, sodium chloride flush  Antimicrobials from admission:  Anti-infectives (From admission, onward)    Start     Dose/Rate Route Frequency Ordered Stop   05/14/23 0000  Vancomycin (VANCOCIN) 1,250 mg in sodium chloride 0.9 % 250 mL IVPB        1,250 mg 166.7 mL/hr over 90 Minutes  Intravenous Every 12 hours 05/13/23 1626     05/13/23 1442  ceFAZolin (ANCEF) IVPB 2g/100 mL premix        2 g 200 mL/hr over 30 Minutes Intravenous 30 min pre-op 05/13/23 1442 05/15/23 0933   05/12/23 0000  vancomycin (VANCOREADY) IVPB 1250 mg/250 mL  Status:  Discontinued        1,250 mg 166.7 mL/hr over 90 Minutes Intravenous Every 12 hours 05/11/23 1345 05/14/23 0103   05/08/23 1000  vancomycin (VANCOCIN) IVPB 1000 mg/200 mL premix  Status:  Discontinued        1,000 mg 200 mL/hr over 60 Minutes Intravenous Every 12 hours 05/07/23 2251 05/11/23 1345   05/08/23 0400  ceFEPIme (  MAXIPIME) 2 g in sodium chloride 0.9 % 100 mL IVPB  Status:  Discontinued        2 g 200 mL/hr over 30 Minutes Intravenous Every 8 hours 05/07/23 2252 05/13/23 0804   05/07/23 2230  ceFEPIme (MAXIPIME) 2 g in sodium chloride 0.9 % 100 mL IVPB  Status:  Discontinued        2 g 200 mL/hr over 30 Minutes Intravenous  Once 05/07/23 2222 05/07/23 2251   05/07/23 2230  metroNIDAZOLE (FLAGYL) IVPB 500 mg  Status:  Discontinued        500 mg 100 mL/hr over 60 Minutes Intravenous Every 12 hours 05/07/23 2222 05/08/23 0953   05/07/23 2230  vancomycin (VANCOCIN) IVPB 1000 mg/200 mL premix  Status:  Discontinued        1,000 mg 200 mL/hr over 60 Minutes Intravenous  Once 05/07/23 2222 05/07/23 2224   05/07/23 2230  Vancomycin (VANCOCIN) 1,500 mg in sodium chloride 0.9 % 500 mL IVPB        1,500 mg 250 mL/hr over 120 Minutes Intravenous  Once 05/07/23 2225 05/08/23 0312   05/07/23 2130  vancomycin (VANCOCIN) IVPB 1000 mg/200 mL premix        1,000 mg 200 mL/hr over 60 Minutes Intravenous  Once 05/07/23 2128 05/08/23 0114   05/07/23 2130  piperacillin-tazobactam (ZOSYN) IVPB 3.375 g        3.375 g 100 mL/hr over 30 Minutes Intravenous  Once 05/07/23 2128 05/07/23 2216           Data Reviewed:  I have personally reviewed the following...  CBC: Recent Labs  Lab 05/09/23 0428 05/10/23 0412 05/11/23 0246  05/14/23 0449 05/15/23 0403  WBC 14.5* 12.9* 10.5 9.2 8.2  HGB 10.6* 9.7* 9.5* 9.3* 9.4*  HCT 30.4* 28.4* 28.3* 27.8* 28.5*  MCV 83.1 84.3 86.5 84.8 87.4  PLT 414* 397 362 383 339   Basic Metabolic Panel: Recent Labs  Lab 05/09/23 0428 05/10/23 0412 05/11/23 0246 05/14/23 0449 05/15/23 0403  NA 132* 133* 132* 137 132*  K 3.7 3.5 3.6 3.1* 3.7  CL 100 102 101 103 99  CO2 23 25 23 26 25   GLUCOSE 265* 216* 172* 113* 218*  BUN 17 20 19 12 14   CREATININE 0.87 0.86 0.93 0.87 0.79  CALCIUM 7.9* 7.8* 7.6* 8.0* 7.8*   GFR: Estimated Creatinine Clearance: 126 mL/min (by C-G formula based on SCr of 0.79 mg/dL). Liver Function Tests: No results for input(s): "AST", "ALT", "ALKPHOS", "BILITOT", "PROT", "ALBUMIN" in the last 168 hours.  No results for input(s): "LIPASE", "AMYLASE" in the last 168 hours. No results for input(s): "AMMONIA" in the last 168 hours. Coagulation Profile: No results for input(s): "INR", "PROTIME" in the last 168 hours. Cardiac Enzymes: No results for input(s): "CKTOTAL", "CKMB", "CKMBINDEX", "TROPONINI" in the last 168 hours. BNP (last 3 results) No results for input(s): "PROBNP" in the last 8760 hours. HbA1C: No results for input(s): "HGBA1C" in the last 72 hours. CBG: Recent Labs  Lab 05/15/23 0011 05/15/23 0454 05/15/23 0755 05/15/23 0808 05/15/23 1012  GLUCAP 247* 189* 108* 114* 132*   Lipid Profile: No results for input(s): "CHOL", "HDL", "LDLCALC", "TRIG", "CHOLHDL", "LDLDIRECT" in the last 72 hours. Thyroid Function Tests: No results for input(s): "TSH", "T4TOTAL", "FREET4", "T3FREE", "THYROIDAB" in the last 72 hours. Anemia Panel: No results for input(s): "VITAMINB12", "FOLATE", "FERRITIN", "TIBC", "IRON", "RETICCTPCT" in the last 72 hours. Most Recent Urinalysis On File:     Component Value Date/Time   COLORURINE  YELLOW (A) 06/12/2015 1252   APPEARANCEUR CLEAR (A) 06/12/2015 1252   LABSPEC 1.042 (H) 06/12/2015 1252   PHURINE 6.0  06/12/2015 1252   GLUCOSEU >500 (A) 06/12/2015 1252   HGBUR NEGATIVE 06/12/2015 1252   BILIRUBINUR NEGATIVE 06/12/2015 1252   KETONESUR TRACE (A) 06/12/2015 1252   PROTEINUR 100 (A) 06/12/2015 1252   NITRITE NEGATIVE 06/12/2015 1252   LEUKOCYTESUR NEGATIVE 06/12/2015 1252   Sepsis Labs: @LABRCNTIP (procalcitonin:4,lacticidven:4) Microbiology: Recent Results (from the past 240 hours)  Blood culture (single)     Status: None   Collection Time: 05/07/23  7:21 PM   Specimen: BLOOD  Result Value Ref Range Status   Specimen Description BLOOD LEFT FOREARM  Final   Special Requests   Final    BOTTLES DRAWN AEROBIC AND ANAEROBIC Blood Culture adequate volume   Culture   Final    NO GROWTH 5 DAYS Performed at [facility], [address], [place], [place] [address]    Report Status 05/12/2023 FINAL  Final  Surgical pcr screen     Status: Abnormal   Collection Time: 05/08/23  8:51 AM   Specimen: Nasal Mucosa; Nasal Swab  Result Value Ref Range Status   MRSA, PCR NEGATIVE NEGATIVE Final   Staphylococcus aureus POSITIVE (A) NEGATIVE Final    Comment: (NOTE) The Xpert SA Assay (FDA approved for NASAL specimens in patients 67 years of age and older), is one component of a comprehensive surveillance program. It is not intended to diagnose infection nor to guide or monitor treatment. Performed at [facility], [address], [place], [place] [address]   Aerobic/Anaerobic Culture w Gram Stain (surgical/deep wound)     Status: None   Collection Time: 05/08/23 11:54 AM   Specimen: Path Tissue  Result Value Ref Range Status   Specimen Description   Final    TISSUE Performed at [facility], [address], [place], [place] [address]    Special Requests SWAB OF RIGHT FOOT DEEP ABSCESS  Final   Gram Stain NO WBC SEEN FEW GRAM POSITIVE COCCI IN PAIRS   Final   Culture   Final    FEW ENTEROCOCCUS FAECALIS FEW METHICILLIN RESISTANT STAPHYLOCOCCUS AUREUS NO  ANAEROBES ISOLATED Performed at [facility], [address], [place], [place] [address]    Report Status 05/13/2023 FINAL  Final   Organism ID, Bacteria ENTEROCOCCUS FAECALIS  Final   Organism ID, Bacteria METHICILLIN RESISTANT STAPHYLOCOCCUS AUREUS  Final      Susceptibility   Enterococcus faecalis - MIC*    AMPICILLIN <=2 SENSITIVE Sensitive     VANCOMYCIN 2 SENSITIVE Sensitive     GENTAMICIN SYNERGY SENSITIVE Sensitive     * FEW ENTEROCOCCUS FAECALIS   Methicillin resistant staphylococcus aureus - MIC*    CIPROFLOXACIN 2 INTERMEDIATE Intermediate     ERYTHROMYCIN >=8 RESISTANT Resistant     GENTAMICIN <=0.5 SENSITIVE Sensitive     OXACILLIN RESISTANT Resistant     TETRACYCLINE <=1 SENSITIVE Sensitive     VANCOMYCIN <=0.5 SENSITIVE Sensitive     TRIMETH/SULFA <=10 SENSITIVE Sensitive     CLINDAMYCIN 2 INTERMEDIATE Intermediate     RIFAMPIN <=0.5 SENSITIVE Sensitive     Inducible Clindamycin NEGATIVE Sensitive     LINEZOLID 2 SENSITIVE Sensitive     * FEW METHICILLIN RESISTANT STAPHYLOCOCCUS AUREUS  Aerobic/Anaerobic Culture w Gram Stain (surgical/deep wound)     Status: None (Preliminary result)   Collection Time: 05/12/23  4:29 PM   Specimen: Path Tissue  Result  Value Ref Range Status   Specimen Description   Final    TISSUE Performed at [facility], [address] [place], [place] [address]    Special Requests   Final    NONE Performed at [facility], [address] [place], [place] [address]    Gram Stain   Final    RARE WBC PRESENT,BOTH PMN AND MONONUCLEAR NO ORGANISMS SEEN    Culture   Final    RARE ENTEROCOCCUS FAECALIS CRITICAL RESULT CALLED TO, READ BACK BY AND VERIFIED WITH: RN kelly h. [phone] fcp Performed at [facility], [address] [place], [place] [address]    Report Status PENDING  Incomplete   Organism ID, Bacteria ENTEROCOCCUS FAECALIS  Final      Susceptibility   Enterococcus faecalis - MIC*     AMPICILLIN <=2 SENSITIVE Sensitive     VANCOMYCIN 2 SENSITIVE Sensitive     GENTAMICIN SYNERGY SENSITIVE Sensitive     * RARE ENTEROCOCCUS FAECALIS      Radiology Studies last 3 days: DG Foot Complete Right Result Date: 05/13/2023 CLINICAL DATA:  Postop check status post transmetatarsal amputation and debridement of the mid calf. EXAM: RIGHT FOOT COMPLETE - 3+ VIEW; RIGHT TIBIA AND FIBULA - 2 VIEW COMPARISON:  Foot radiographs 05/11/2023 and 05/07/2023. MRI 05/07/2023. FINDINGS: Right tibia and fibula: The mineralization and alignment are normal. There is no evidence of acute fracture or dislocation. The joint spaces appear preserved at the knee. There are vascular stents posterior to the distal right femur and within the popliteal fossa. No focal soft tissue abnormalities are identified allowing for artifact associated with overlying sheets or clothing. Right foot: Status post transmetatarsal amputation through the bases of all the metatarsals, unchanged from the prior study of 2 days earlier. No progressive bone destruction. A small amount of gas is present within the distal soft tissues, nonspecific. Skin staples are in place. IMPRESSION: 1. Stable postoperative appearance of the right foot status post transmetatarsal amputation. No evidence of progressive bone destruction. 2. No acute osseous findings in the right lower leg. Electronically Signed   By: Carey Bullocks M.D.   On: 05/13/2023 13:52   DG Tibia/Fibula Right Result Date: 05/13/2023 CLINICAL DATA:  Postop check status post transmetatarsal amputation and debridement of the mid calf. EXAM: RIGHT FOOT COMPLETE - 3+ VIEW; RIGHT TIBIA AND FIBULA - 2 VIEW COMPARISON:  Foot radiographs 05/11/2023 and 05/07/2023. MRI 05/07/2023. FINDINGS: Right tibia and fibula: The mineralization and alignment are normal. There is no evidence of acute fracture or dislocation. The joint spaces appear preserved at the knee. There are vascular stents posterior to  the distal right femur and within the popliteal fossa. No focal soft tissue abnormalities are identified allowing for artifact associated with overlying sheets or clothing. Right foot: Status post transmetatarsal amputation through the bases of all the metatarsals, unchanged from the prior study of 2 days earlier. No progressive bone destruction. A small amount of gas is present within the distal soft tissues, nonspecific. Skin staples are in place. IMPRESSION: 1. Stable postoperative appearance of the right foot status post transmetatarsal amputation. No evidence of progressive bone destruction. 2. No acute osseous findings in the right lower leg. Electronically Signed   By: Carey Bullocks M.D.   On: 05/13/2023 13:52        Sunnie Nielsen, DO [facility] 05/15/2023, 1:10 PM    Dictation software may have been used to generate the above note. Typos may occur  and escape review in typed/dictated notes. Please contact Dr Lyn Hollingshead directly for clarity if needed.  Staff may message me via secure chat in Epic  but this may not receive an immediate response,  please page me for urgent matters!  If 7PM-7AM, please contact night coverage [email or web address]

## 2023-05-15 NOTE — Anesthesia Postprocedure Evaluation (Signed)
Anesthesia Post Note  Patient: Gerald Ellis  Procedure(s) Performed: AMPUTATION BELOW KNEE (Right: Knee)  Patient location during evaluation: PACU Anesthesia Type: General Level of consciousness: awake Pain management: satisfactory to patient Vital Signs Assessment: post-procedure vital signs reviewed and stable Respiratory status: spontaneous breathing Cardiovascular status: stable Anesthetic complications: no   No notable events documented.   Last Vitals:  Vitals:   05/15/23 1007 05/15/23 1015  BP: 137/85 (!) 155/99  Pulse: 93 93  Resp: 14 14  Temp: 36.6 C   SpO2: 99% 98%    Last Pain:  Vitals:   05/15/23 0806  TempSrc: Oral  PainSc: 5                  VAN STAVEREN,Aquita Simmering

## 2023-05-15 NOTE — Anesthesia Procedure Notes (Signed)
Procedure Name: LMA Insertion Date/Time: 05/15/2023 8:58 AM  Performed by: Malva Cogan, CRNAPre-anesthesia Checklist: Patient identified, Patient being monitored, Timeout performed, Emergency Drugs available and Suction available Patient Re-evaluated:Patient Re-evaluated prior to induction Oxygen Delivery Method: Circle system utilized Preoxygenation: Pre-oxygenation with 100% oxygen Induction Type: IV induction Ventilation: Mask ventilation without difficulty LMA: LMA inserted LMA Size: 4.0 Tube type: Oral Number of attempts: 1 Placement Confirmation: positive ETCO2 and breath sounds checked- equal and bilateral Tube secured with: Tape Dental Injury: Teeth and Oropharynx as per pre-operative assessment

## 2023-05-15 NOTE — H&P (View-Only) (Signed)
 PROGRESS NOTE    Gerald Ellis   NWG:[id number] DOB: 11/18/1965  DOA: 05/07/2023 Date of Service: 05/15/23 which is hospital day 8  PCP: Jerl Mina, MD    HPI: Gerald Ellis is a 57 y.o. male with medical history significant for HTN, HLD, CAD s/p stent, DM with neuropathy, chronic back pain on opiates,, anxiety, chronic ulcer first right MTP, followed by podiatry (last seen 02/18/2023), a with normal resting ABI at the time and treated with topical dressings who presents to the ED on 05/07/23 with a 1 week history of blistering in the area of the wound and then over the past day he noted that his second and third toes of the right foot were turning black.   Hospital course / significant events:  12/19: admitted to hospitalist service, started cefepime + vanc, podiatry and vascular consults, underwent transmetatarsal amputation R foot 12/20: underwent angiography RLE w/ stent placement  12/21: monitoring post-revascularization 12/22: podiatry reeval, planning to OR for washout/I&D tomorrow given sub-cutaneous gas on x-ray today. Wound Cx (+)mrsa, enterococcus  12/23: irrigation/debridement R foot, washout  12/24: foot not healing / not salvageable, planning BKA.  12/25: stable, await BKA tomorrow  12/26: BKA  Consultants:  Podiatry  Vascular Surgery   Procedures/Surgeries: 05/08/2023: Transmetatarsal amputation right foot - Dr Alberteen Spindle  05/09/2023: RLE angioplasty w/ stent to superficial femoral, popliteal, anterior tibial arteries - Dr. 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 Prasugrel  Debility, ambulatory dysfunction  HH PT/OT  Obesity Class 1 based on BMI: Body mass index is 33.72 kg/m.  Underweight - under 18  overweight - 25 to 29 obese - 30 or more Class 1 obesity: BMI of 30.0 to 34 Class 2 obesity: BMI of 35.0 to 39 Class 3 obesity: BMI of 40.0 to 49 Super Morbid Obesity: BMI 50-59 Super-super Morbid Obesity: BMI 60+ Significantly low or high BMI is associated with higher medical risk.  Weight management advised as adjunct to other disease management and risk reduction treatments    DVT prophylaxis: lovenox  IV fluids: no continuous IV fluids  Nutrition: diabetic diet  Central lines / invasive devices: none  Code Status: FULL CODE ACP documentation reviewed:  none on file in [facility]  TOC needs: pend PT/OT reeval, may need SNF/rehab now that has BKA  Barriers to dispo / significant pending items: BKA today, PT/OT tomorrow              Subjective / Brief ROS:  Patient reports no concerns at this time  Examined in hospital room, postop  No fever/chills  Denies CP/SOB.  Pain controlled, RLE very sore postop  Denies new weakness.   Family Communication: family at bedside on rounds     Objective Findings:  Vitals:   05/15/23 1100 05/15/23 1115 05/15/23 1130 05/15/23 1150  BP: (!) 161/81 (!) 153/85 (!) 146/80 (!) 146/84  Pulse: 94 94 (!)  139 95  Resp: 14 19 (!) 9 17  Temp:    98.8 F (37.1 C)  TempSrc:      SpO2: 92% 92% 96% 94%  Weight:      Height:        Intake/Output Summary (Last 24 hours) at 05/15/2023 1310 Last data filed at 05/15/2023 1100 Gross per 24 hour  Intake 1119.97 ml  Output 1550 ml  Net -430.03 ml   Filed Weights   05/07/23 1918 05/12/23 1519 05/15/23 0806  Weight: 106.6  kg 106.6 kg 106.6 kg    Examination:  Physical Exam Constitutional:      General: He is not in acute distress.    Appearance: He is not ill-appearing.  Cardiovascular:     Rate and Rhythm: Normal rate and regular rhythm.  Pulmonary:     Effort: Pulmonary effort is normal.     Breath sounds: Normal breath sounds.  Neurological:     General: No focal deficit present.     Mental Status: He is alert and oriented to person, place, and time.  Psychiatric:        Mood and Affect: Mood normal.        Behavior: Behavior normal.          Scheduled Medications:   amLODipine  5 mg Oral Daily   aspirin  81 mg Oral Daily   atorvastatin  80 mg Oral q1800   busPIRone  7.5 mg Oral BID   enoxaparin (LOVENOX) injection  0.5 mg/kg Subcutaneous Daily   feeding supplement  237 mL Oral BID BM   insulin aspart  0-20 Units Subcutaneous Q4H   insulin aspart  3 Units Subcutaneous TID WC   insulin glargine-yfgn  38 Units Subcutaneous QHS   isosorbide mononitrate  30 mg Oral Daily   metoprolol succinate  50 mg Oral Daily   omega-3 acid ethyl esters  1 g Oral BID   pantoprazole  40 mg Oral BID   polyethylene glycol  17 g Oral Daily   prasugrel  10 mg Oral Daily   sodium chloride flush  3 mL Intravenous Q12H   sodium chloride flush  3-10 mL Intravenous Q12H    Continuous Infusions:  Vancomycin (VANCOCIN) 1,250 mg in sodium chloride 0.9 % 250 mL IVPB 1,250 mg (05/15/23 1216)    PRN Medications:  acetaminophen **OR** acetaminophen, cyclobenzaprine, HYDROcodone-acetaminophen, ipratropium-albuterol, magnesium hydroxide, morphine injection, ondansetron **OR** ondansetron (ZOFRAN) IV, phenol, sodium chloride flush, sodium chloride flush  Antimicrobials from admission:  Anti-infectives (From admission, onward)    Start     Dose/Rate Route Frequency Ordered Stop   05/14/23 0000  Vancomycin (VANCOCIN) 1,250 mg in sodium chloride 0.9 % 250 mL IVPB        1,250 mg 166.7 mL/hr over 90 Minutes  Intravenous Every 12 hours 05/13/23 1626     05/13/23 1442  ceFAZolin (ANCEF) IVPB 2g/100 mL premix        2 g 200 mL/hr over 30 Minutes Intravenous 30 min pre-op 05/13/23 1442 05/15/23 0933   05/12/23 0000  vancomycin (VANCOREADY) IVPB 1250 mg/250 mL  Status:  Discontinued        1,250 mg 166.7 mL/hr over 90 Minutes Intravenous Every 12 hours 05/11/23 1345 05/14/23 0103   05/08/23 1000  vancomycin (VANCOCIN) IVPB 1000 mg/200 mL premix  Status:  Discontinued        1,000 mg 200 mL/hr over 60 Minutes Intravenous Every 12 hours 05/07/23 2251 05/11/23 1345   05/08/23 0400  ceFEPIme (  MAXIPIME) 2 g in sodium chloride 0.9 % 100 mL IVPB  Status:  Discontinued        2 g 200 mL/hr over 30 Minutes Intravenous Every 8 hours 05/07/23 2252 05/13/23 0804   05/07/23 2230  ceFEPIme (MAXIPIME) 2 g in sodium chloride 0.9 % 100 mL IVPB  Status:  Discontinued        2 g 200 mL/hr over 30 Minutes Intravenous  Once 05/07/23 2222 05/07/23 2251   05/07/23 2230  metroNIDAZOLE (FLAGYL) IVPB 500 mg  Status:  Discontinued        500 mg 100 mL/hr over 60 Minutes Intravenous Every 12 hours 05/07/23 2222 05/08/23 0953   05/07/23 2230  vancomycin (VANCOCIN) IVPB 1000 mg/200 mL premix  Status:  Discontinued        1,000 mg 200 mL/hr over 60 Minutes Intravenous  Once 05/07/23 2222 05/07/23 2224   05/07/23 2230  Vancomycin (VANCOCIN) 1,500 mg in sodium chloride 0.9 % 500 mL IVPB        1,500 mg 250 mL/hr over 120 Minutes Intravenous  Once 05/07/23 2225 05/08/23 0312   05/07/23 2130  vancomycin (VANCOCIN) IVPB 1000 mg/200 mL premix        1,000 mg 200 mL/hr over 60 Minutes Intravenous  Once 05/07/23 2128 05/08/23 0114   05/07/23 2130  piperacillin-tazobactam (ZOSYN) IVPB 3.375 g        3.375 g 100 mL/hr over 30 Minutes Intravenous  Once 05/07/23 2128 05/07/23 2216           Data Reviewed:  I have personally reviewed the following...  CBC: Recent Labs  Lab 05/09/23 0428 05/10/23 0412 05/11/23 0246  05/14/23 0449 05/15/23 0403  WBC 14.5* 12.9* 10.5 9.2 8.2  HGB 10.6* 9.7* 9.5* 9.3* 9.4*  HCT 30.4* 28.4* 28.3* 27.8* 28.5*  MCV 83.1 84.3 86.5 84.8 87.4  PLT 414* 397 362 383 339   Basic Metabolic Panel: Recent Labs  Lab 05/09/23 0428 05/10/23 0412 05/11/23 0246 05/14/23 0449 05/15/23 0403  NA 132* 133* 132* 137 132*  K 3.7 3.5 3.6 3.1* 3.7  CL 100 102 101 103 99  CO2 23 25 23 26 25   GLUCOSE 265* 216* 172* 113* 218*  BUN 17 20 19 12 14   CREATININE 0.87 0.86 0.93 0.87 0.79  CALCIUM 7.9* 7.8* 7.6* 8.0* 7.8*   GFR: Estimated Creatinine Clearance: 126 mL/min (by C-G formula based on SCr of 0.79 mg/dL). Liver Function Tests: No results for input(s): "AST", "ALT", "ALKPHOS", "BILITOT", "PROT", "ALBUMIN" in the last 168 hours.  No results for input(s): "LIPASE", "AMYLASE" in the last 168 hours. No results for input(s): "AMMONIA" in the last 168 hours. Coagulation Profile: No results for input(s): "INR", "PROTIME" in the last 168 hours. Cardiac Enzymes: No results for input(s): "CKTOTAL", "CKMB", "CKMBINDEX", "TROPONINI" in the last 168 hours. BNP (last 3 results) No results for input(s): "PROBNP" in the last 8760 hours. HbA1C: No results for input(s): "HGBA1C" in the last 72 hours. CBG: Recent Labs  Lab 05/15/23 0011 05/15/23 0454 05/15/23 0755 05/15/23 0808 05/15/23 1012  GLUCAP 247* 189* 108* 114* 132*   Lipid Profile: No results for input(s): "CHOL", "HDL", "LDLCALC", "TRIG", "CHOLHDL", "LDLDIRECT" in the last 72 hours. Thyroid Function Tests: No results for input(s): "TSH", "T4TOTAL", "FREET4", "T3FREE", "THYROIDAB" in the last 72 hours. Anemia Panel: No results for input(s): "VITAMINB12", "FOLATE", "FERRITIN", "TIBC", "IRON", "RETICCTPCT" in the last 72 hours. Most Recent Urinalysis On File:     Component Value Date/Time   COLORURINE  YELLOW (A) 06/12/2015 1252   APPEARANCEUR CLEAR (A) 06/12/2015 1252   LABSPEC 1.042 (H) 06/12/2015 1252   PHURINE 6.0  06/12/2015 1252   GLUCOSEU >500 (A) 06/12/2015 1252   HGBUR NEGATIVE 06/12/2015 1252   BILIRUBINUR NEGATIVE 06/12/2015 1252   KETONESUR TRACE (A) 06/12/2015 1252   PROTEINUR 100 (A) 06/12/2015 1252   NITRITE NEGATIVE 06/12/2015 1252   LEUKOCYTESUR NEGATIVE 06/12/2015 1252   Sepsis Labs: @LABRCNTIP (procalcitonin:4,lacticidven:4) Microbiology: Recent Results (from the past 240 hours)  Blood culture (single)     Status: None   Collection Time: 05/07/23  7:21 PM   Specimen: BLOOD  Result Value Ref Range Status   Specimen Description BLOOD LEFT FOREARM  Final   Special Requests   Final    BOTTLES DRAWN AEROBIC AND ANAEROBIC Blood Culture adequate volume   Culture   Final    NO GROWTH 5 DAYS Performed at [facility], [address], [place], [place] [address]    Report Status 05/12/2023 FINAL  Final  Surgical pcr screen     Status: Abnormal   Collection Time: 05/08/23  8:51 AM   Specimen: Nasal Mucosa; Nasal Swab  Result Value Ref Range Status   MRSA, PCR NEGATIVE NEGATIVE Final   Staphylococcus aureus POSITIVE (A) NEGATIVE Final    Comment: (NOTE) The Xpert SA Assay (FDA approved for NASAL specimens in patients 67 years of age and older), is one component of a comprehensive surveillance program. It is not intended to diagnose infection nor to guide or monitor treatment. Performed at [facility], [address], [place], [place] [address]   Aerobic/Anaerobic Culture w Gram Stain (surgical/deep wound)     Status: None   Collection Time: 05/08/23 11:54 AM   Specimen: Path Tissue  Result Value Ref Range Status   Specimen Description   Final    TISSUE Performed at [facility], [address], [place], [place] [address]    Special Requests SWAB OF RIGHT FOOT DEEP ABSCESS  Final   Gram Stain NO WBC SEEN FEW GRAM POSITIVE COCCI IN PAIRS   Final   Culture   Final    FEW ENTEROCOCCUS FAECALIS FEW METHICILLIN RESISTANT STAPHYLOCOCCUS AUREUS NO  ANAEROBES ISOLATED Performed at [facility], [address], [place], [place] [address]    Report Status 05/13/2023 FINAL  Final   Organism ID, Bacteria ENTEROCOCCUS FAECALIS  Final   Organism ID, Bacteria METHICILLIN RESISTANT STAPHYLOCOCCUS AUREUS  Final      Susceptibility   Enterococcus faecalis - MIC*    AMPICILLIN <=2 SENSITIVE Sensitive     VANCOMYCIN 2 SENSITIVE Sensitive     GENTAMICIN SYNERGY SENSITIVE Sensitive     * FEW ENTEROCOCCUS FAECALIS   Methicillin resistant staphylococcus aureus - MIC*    CIPROFLOXACIN 2 INTERMEDIATE Intermediate     ERYTHROMYCIN >=8 RESISTANT Resistant     GENTAMICIN <=0.5 SENSITIVE Sensitive     OXACILLIN RESISTANT Resistant     TETRACYCLINE <=1 SENSITIVE Sensitive     VANCOMYCIN <=0.5 SENSITIVE Sensitive     TRIMETH/SULFA <=10 SENSITIVE Sensitive     CLINDAMYCIN 2 INTERMEDIATE Intermediate     RIFAMPIN <=0.5 SENSITIVE Sensitive     Inducible Clindamycin NEGATIVE Sensitive     LINEZOLID 2 SENSITIVE Sensitive     * FEW METHICILLIN RESISTANT STAPHYLOCOCCUS AUREUS  Aerobic/Anaerobic Culture w Gram Stain (surgical/deep wound)     Status: None (Preliminary result)   Collection Time: 05/12/23  4:29 PM   Specimen: Path Tissue  Result  Value Ref Range Status   Specimen Description   Final    TISSUE Performed at [facility], [address] [place], [place] [address]    Special Requests   Final    NONE Performed at [facility], [address] [place], [place] [address]    Gram Stain   Final    RARE WBC PRESENT,BOTH PMN AND MONONUCLEAR NO ORGANISMS SEEN    Culture   Final    RARE ENTEROCOCCUS FAECALIS CRITICAL RESULT CALLED TO, READ BACK BY AND VERIFIED WITH: RN kelly h. [phone] fcp Performed at [facility], [address] [place], [place] [address]    Report Status PENDING  Incomplete   Organism ID, Bacteria ENTEROCOCCUS FAECALIS  Final      Susceptibility   Enterococcus faecalis - MIC*     AMPICILLIN <=2 SENSITIVE Sensitive     VANCOMYCIN 2 SENSITIVE Sensitive     GENTAMICIN SYNERGY SENSITIVE Sensitive     * RARE ENTEROCOCCUS FAECALIS      Radiology Studies last 3 days: DG Foot Complete Right Result Date: 05/13/2023 CLINICAL DATA:  Postop check status post transmetatarsal amputation and debridement of the mid calf. EXAM: RIGHT FOOT COMPLETE - 3+ VIEW; RIGHT TIBIA AND FIBULA - 2 VIEW COMPARISON:  Foot radiographs 05/11/2023 and 05/07/2023. MRI 05/07/2023. FINDINGS: Right tibia and fibula: The mineralization and alignment are normal. There is no evidence of acute fracture or dislocation. The joint spaces appear preserved at the knee. There are vascular stents posterior to the distal right femur and within the popliteal fossa. No focal soft tissue abnormalities are identified allowing for artifact associated with overlying sheets or clothing. Right foot: Status post transmetatarsal amputation through the bases of all the metatarsals, unchanged from the prior study of 2 days earlier. No progressive bone destruction. A small amount of gas is present within the distal soft tissues, nonspecific. Skin staples are in place. IMPRESSION: 1. Stable postoperative appearance of the right foot status post transmetatarsal amputation. No evidence of progressive bone destruction. 2. No acute osseous findings in the right lower leg. Electronically Signed   By: Carey Bullocks M.D.   On: 05/13/2023 13:52   DG Tibia/Fibula Right Result Date: 05/13/2023 CLINICAL DATA:  Postop check status post transmetatarsal amputation and debridement of the mid calf. EXAM: RIGHT FOOT COMPLETE - 3+ VIEW; RIGHT TIBIA AND FIBULA - 2 VIEW COMPARISON:  Foot radiographs 05/11/2023 and 05/07/2023. MRI 05/07/2023. FINDINGS: Right tibia and fibula: The mineralization and alignment are normal. There is no evidence of acute fracture or dislocation. The joint spaces appear preserved at the knee. There are vascular stents posterior to  the distal right femur and within the popliteal fossa. No focal soft tissue abnormalities are identified allowing for artifact associated with overlying sheets or clothing. Right foot: Status post transmetatarsal amputation through the bases of all the metatarsals, unchanged from the prior study of 2 days earlier. No progressive bone destruction. A small amount of gas is present within the distal soft tissues, nonspecific. Skin staples are in place. IMPRESSION: 1. Stable postoperative appearance of the right foot status post transmetatarsal amputation. No evidence of progressive bone destruction. 2. No acute osseous findings in the right lower leg. Electronically Signed   By: Carey Bullocks M.D.   On: 05/13/2023 13:52        Sunnie Nielsen, DO [facility] 05/15/2023, 1:10 PM    Dictation software may have been used to generate the above note. Typos may occur  and escape review in typed/dictated notes. Please contact Dr Lyn Hollingshead directly for clarity if needed.  Staff may message me via secure chat in Epic  but this may not receive an immediate response,  please page me for urgent matters!  If 7PM-7AM, please contact night coverage [email or web address]

## 2023-05-15 NOTE — Progress Notes (Signed)
PT Cancellation Note  Patient Details Name: Gerald Ellis MRN: [id number] DOB: May 06, 1966   Cancelled Treatment:    Reason Eval/Treat Not Completed: Patient at procedure or test/unavailable Patient in surgery this am. Will re-eval tomorrow as appropriate.  Ensley Blas 05/15/2023, 8:56 AM

## 2023-05-15 NOTE — Inpatient Diabetes Management (Signed)
Inpatient Diabetes Program Recommendations  AACE/ADA: New Consensus Statement on Inpatient Glycemic Control  Target Ranges:  Prepandial:   less than 140 mg/dL      Peak postprandial:   less than 180 mg/dL (1-2 hours)      Critically ill patients:  140 - 180 mg/dL    Latest Reference Range & Units 05/14/23 00:00 05/14/23 04:23 05/14/23 08:19 05/14/23 11:45 05/14/23 16:30 05/14/23 20:08 05/15/23 00:11 05/15/23 04:54  Glucose-Capillary 70 - 99 mg/dL 629 (H) 528 (H) 413 (H) 177 (H) 285 (H) 222 (H) 247 (H) 189 (H)   Review of Glycemic Control  Diabetes history: DM2 Outpatient Diabetes medications: Humalog 70 units TID with meals, Tresiba 142 units at bedtime, Metformin 1000 mg BID, Actos 15 mg BID Current orders for Inpatient glycemic control: Semglee 38 units daily at bedtime, Novolog 0-20 units Q4H, Novolog 3 units TID with meals  Inpatient Diabetes Program Recommendations:    Insulin: Please consider increasing Semglee to 42 units at bedtime and meal coverage to Novolog 5 units TID with meals.   Thanks, Orlando Penner, RN, MSN, CDCES Diabetes Coordinator Inpatient Diabetes Program [phone] (Team Pager from 8am to 5pm)

## 2023-05-15 NOTE — Transfer of Care (Signed)
Immediate Anesthesia Transfer of Care Note  Patient: Gerald Ellis  Procedure(s) Performed: AMPUTATION BELOW KNEE (Right: Knee)  Patient Location: PACU  Anesthesia Type:General  Level of Consciousness: drowsy  Airway & Oxygen Therapy: Patient Spontanous Breathing and Patient connected to face mask oxygen  Post-op Assessment: Report given to RN and Post -op Vital signs reviewed and stable  Post vital signs: Reviewed and stable  Last Vitals:  Vitals Value Taken Time  BP 137/85 05/15/23 1007  Temp    Pulse 93 05/15/23 1008  Resp 13 05/15/23 1008  SpO2 99 % 05/15/23 1008  Vitals shown include unfiled device data.  Last Pain:  Vitals:   05/15/23 0806  TempSrc: Oral  PainSc: 5       Patients Stated Pain Goal: 0 (05/14/23 2032)  Complications: No notable events documented.

## 2023-05-15 NOTE — Interval H&P Note (Signed)
History and Physical Interval Note:  05/15/2023 7:46 AM  Gerald Ellis  has presented today for surgery, with the diagnosis of right foot gangrene.  The various methods of treatment have been discussed with the patient and family. After consideration of risks, benefits and other options for treatment, the patient has consented to  Procedure(s): AMPUTATION BELOW KNEE (Right) as a surgical intervention.  The patient's history has been reviewed, patient examined, no change in status, stable for surgery.  I have reviewed the patient's chart and labs.  Questions were answered to the patient's satisfaction.     Festus Barren

## 2023-05-16 ENCOUNTER — Encounter: Payer: Self-pay | Admitting: Vascular Surgery

## 2023-05-16 ENCOUNTER — Encounter
Admission: EM | Disposition: A | Discharge status: Skilled Nursing Facility | Payer: Self-pay | Source: Home / Self Care | Attending: Osteopathic Medicine

## 2023-05-16 ENCOUNTER — Other Ambulatory Visit: Payer: Self-pay

## 2023-05-16 ENCOUNTER — Inpatient Hospital Stay: Payer: PPO | Admitting: Anesthesiology

## 2023-05-16 DIAGNOSIS — Z89511 Acquired absence of right leg below knee: Secondary | ICD-10-CM

## 2023-05-16 DIAGNOSIS — M96840 Postprocedural hematoma of a musculoskeletal structure following a musculoskeletal system procedure: Secondary | ICD-10-CM

## 2023-05-16 DIAGNOSIS — E11628 Type 2 diabetes mellitus with other skin complications: Secondary | ICD-10-CM | POA: Diagnosis not present

## 2023-05-16 DIAGNOSIS — L089 Local infection of the skin and subcutaneous tissue, unspecified: Secondary | ICD-10-CM | POA: Diagnosis not present

## 2023-05-16 HISTORY — PX: AMPUTATION: SHX166

## 2023-05-16 LAB — GLUCOSE, CAPILLARY
Glucose-Capillary: 115 mg/dL — ABNORMAL HIGH (ref 70–99)
Glucose-Capillary: 115 mg/dL — ABNORMAL HIGH (ref 70–99)
Glucose-Capillary: 147 mg/dL — ABNORMAL HIGH (ref 70–99)
Glucose-Capillary: 161 mg/dL — ABNORMAL HIGH (ref 70–99)
Glucose-Capillary: 184 mg/dL — ABNORMAL HIGH (ref 70–99)
Glucose-Capillary: 233 mg/dL — ABNORMAL HIGH (ref 70–99)
Glucose-Capillary: 258 mg/dL — ABNORMAL HIGH (ref 70–99)

## 2023-05-16 LAB — CBC
HCT: 27.2 % — ABNORMAL LOW (ref 39.0–52.0)
Hemoglobin: 9.1 g/dL — ABNORMAL LOW (ref 13.0–17.0)
MCH: 28.6 pg (ref 26.0–34.0)
MCHC: 33.5 g/dL (ref 30.0–36.0)
MCV: 85.5 fL (ref 80.0–100.0)
Platelets: 368 10*3/uL (ref 150–400)
RBC: 3.18 MIL/uL — ABNORMAL LOW (ref 4.22–5.81)
RDW: 12.8 % (ref 11.5–15.5)
WBC: 9.5 10*3/uL (ref 4.0–10.5)
nRBC: 0 % (ref 0.0–0.2)

## 2023-05-16 LAB — BASIC METABOLIC PANEL
Anion gap: 11 (ref 5–15)
BUN: 8 mg/dL (ref 6–20)
CO2: 25 mmol/L (ref 22–32)
Calcium: 8.3 mg/dL — ABNORMAL LOW (ref 8.9–10.3)
Chloride: 97 mmol/L — ABNORMAL LOW (ref 98–111)
Creatinine, Ser: 0.73 mg/dL (ref 0.61–1.24)
GFR, Estimated: >60 mL/min (ref >60)
Glucose, Bld: 130 mg/dL — ABNORMAL HIGH (ref 70–99)
Potassium: 3.9 mmol/L (ref 3.5–5.1)
Sodium: 133 mmol/L — ABNORMAL LOW (ref 135–145)

## 2023-05-16 LAB — TYPE AND SCREEN
ABO/RH(D): O POS
Antibody Screen: NEGATIVE

## 2023-05-16 LAB — HEMOGLOBIN AND HEMATOCRIT, BLOOD
HCT: 25.1 % — ABNORMAL LOW (ref 39.0–52.0)
Hemoglobin: 8.4 g/dL — ABNORMAL LOW (ref 13.0–17.0)

## 2023-05-16 SURGERY — AMPUTATION BELOW KNEE
Anesthesia: General | Site: Knee | Laterality: Right

## 2023-05-16 MED ORDER — ROCURONIUM BROMIDE 100 MG/10ML IV SOLN
INTRAVENOUS | Status: DC | PRN
  Administered 2023-05-16: 20 mg via INTRAVENOUS

## 2023-05-16 MED ORDER — OXYCODONE HCL 5 MG/5ML PO SOLN: 5.0000 mg | Freq: Once | ORAL | Status: AC | PRN

## 2023-05-16 MED ORDER — FENTANYL CITRATE (PF) 100 MCG/2ML IJ SOLN
INTRAMUSCULAR | Status: AC
  Filled 2023-05-16: qty 2

## 2023-05-16 MED ORDER — PROPOFOL 10 MG/ML IV BOLUS
INTRAVENOUS | Status: AC
  Filled 2023-05-16: qty 20

## 2023-05-16 MED ORDER — ONDANSETRON HCL 4 MG/2ML IJ SOLN: 4.0000 mg | Freq: Once | INTRAMUSCULAR | Status: DC | PRN

## 2023-05-16 MED ORDER — OXYCODONE HCL 5 MG PO TABS
ORAL_TABLET | ORAL | Status: AC
  Filled 2023-05-16: qty 1

## 2023-05-16 MED ORDER — SUCCINYLCHOLINE CHLORIDE 200 MG/10ML IV SOSY
PREFILLED_SYRINGE | INTRAVENOUS | Status: DC | PRN
  Administered 2023-05-16: 120 mg via INTRAVENOUS

## 2023-05-16 MED ORDER — ONDANSETRON HCL 4 MG/2ML IJ SOLN
INTRAMUSCULAR | Status: DC | PRN
  Administered 2023-05-16: 4 mg via INTRAVENOUS

## 2023-05-16 MED ORDER — HYDROCODONE-ACETAMINOPHEN 5-325 MG PO TABS
1.0000 | ORAL_TABLET | ORAL | Status: DC | PRN
  Administered 2023-05-16 – 2023-05-19 (×12): 2 via ORAL
  Administered 2023-05-20: 1 via ORAL
  Administered 2023-05-20 – 2023-05-22 (×9): 2 via ORAL
  Administered 2023-05-22: 1 via ORAL
  Administered 2023-05-22 – 2023-05-23 (×2): 2 via ORAL
  Administered 2023-05-23 (×2): 1 via ORAL
  Administered 2023-05-23 (×2): 2 via ORAL
  Administered 2023-05-24 – 2023-05-25 (×4): 1 via ORAL
  Administered 2023-05-26 (×2): 2 via ORAL
  Administered 2023-05-26: 1 via ORAL
  Filled 2023-05-16 (×3): qty 2
  Filled 2023-05-16: qty 1
  Filled 2023-05-16: qty 2
  Filled 2023-05-16: qty 1
  Filled 2023-05-16 (×5): qty 2
  Filled 2023-05-16: qty 1
  Filled 2023-05-16 (×2): qty 2
  Filled 2023-05-16: qty 1
  Filled 2023-05-16 (×13): qty 2
  Filled 2023-05-16: qty 1
  Filled 2023-05-16 (×2): qty 2
  Filled 2023-05-16 (×2): qty 1
  Filled 2023-05-16 (×3): qty 2

## 2023-05-16 MED ORDER — CEFAZOLIN SODIUM-DEXTROSE 2-4 GM/100ML-% IV SOLN
INTRAVENOUS | Status: AC
  Filled 2023-05-16: qty 100

## 2023-05-16 MED ORDER — LIDOCAINE HCL (CARDIAC) PF 100 MG/5ML IV SOSY
PREFILLED_SYRINGE | INTRAVENOUS | Status: DC | PRN
  Administered 2023-05-16: 100 mg via INTRAVENOUS

## 2023-05-16 MED ORDER — MIDAZOLAM HCL 2 MG/2ML IJ SOLN
INTRAMUSCULAR | Status: AC
  Filled 2023-05-16: qty 2

## 2023-05-16 MED ORDER — CHLORHEXIDINE GLUCONATE CLOTH 2 % EX PADS: 6.0000 | MEDICATED_PAD | Freq: Once | CUTANEOUS | Status: DC

## 2023-05-16 MED ORDER — OXYCODONE HCL 5 MG PO TABS
5.0000 mg | ORAL_TABLET | Freq: Once | ORAL | Status: AC | PRN
  Administered 2023-05-16: 5 mg via ORAL

## 2023-05-16 MED ORDER — PROPOFOL 10 MG/ML IV BOLUS
INTRAVENOUS | Status: DC | PRN
  Administered 2023-05-16: 150 mg via INTRAVENOUS

## 2023-05-16 MED ORDER — SODIUM CHLORIDE 0.9 % IR SOLN
Status: DC | PRN
  Administered 2023-05-16: 1

## 2023-05-16 MED ORDER — FENTANYL CITRATE (PF) 100 MCG/2ML IJ SOLN
25.0000 ug | INTRAMUSCULAR | Status: DC | PRN
  Administered 2023-05-16 (×4): 25 ug via INTRAVENOUS

## 2023-05-16 MED ORDER — SODIUM CHLORIDE 0.9 % IV SOLN: INTRAVENOUS | Status: DC

## 2023-05-16 MED ORDER — FENTANYL CITRATE (PF) 100 MCG/2ML IJ SOLN
INTRAMUSCULAR | Status: DC | PRN
  Administered 2023-05-16 (×2): 50 ug via INTRAVENOUS

## 2023-05-16 MED ORDER — EPHEDRINE SULFATE-NACL 50-0.9 MG/10ML-% IV SOSY
PREFILLED_SYRINGE | INTRAVENOUS | Status: DC | PRN
  Administered 2023-05-16: 5 mg via INTRAVENOUS
  Administered 2023-05-16: 2.5 mg via INTRAVENOUS
  Administered 2023-05-16: 5 mg via INTRAVENOUS

## 2023-05-16 MED ORDER — PRASUGREL HCL 10 MG PO TABS
10.0000 mg | ORAL_TABLET | Freq: Every day | ORAL | Status: DC
  Administered 2023-05-18 – 2023-05-26 (×9): 10 mg via ORAL
  Filled 2023-05-16 (×9): qty 1

## 2023-05-16 MED ORDER — PHENYLEPHRINE 80 MCG/ML (10ML) SYRINGE FOR IV PUSH (FOR BLOOD PRESSURE SUPPORT)
PREFILLED_SYRINGE | INTRAVENOUS | Status: DC | PRN
  Administered 2023-05-16: 160 ug via INTRAVENOUS
  Administered 2023-05-16: 80 ug via INTRAVENOUS
  Administered 2023-05-16: 160 ug via INTRAVENOUS
  Administered 2023-05-16: 80 ug via INTRAVENOUS
  Administered 2023-05-16: 160 ug via INTRAVENOUS
  Administered 2023-05-16: 80 ug via INTRAVENOUS
  Administered 2023-05-16 (×2): 160 ug via INTRAVENOUS

## 2023-05-16 MED ORDER — MORPHINE SULFATE (PF) 2 MG/ML IV SOLN
2.0000 mg | INTRAVENOUS | Status: AC | PRN
  Administered 2023-05-16 – 2023-05-18 (×6): 2 mg via INTRAVENOUS
  Filled 2023-05-16 (×6): qty 1

## 2023-05-16 MED ORDER — SUGAMMADEX SODIUM 200 MG/2ML IV SOLN
INTRAVENOUS | Status: DC | PRN
  Administered 2023-05-16: 200 mg via INTRAVENOUS

## 2023-05-16 MED ORDER — BACITRACIN ZINC 500 UNIT/GM EX OINT
TOPICAL_OINTMENT | CUTANEOUS | Status: DC | PRN
  Administered 2023-05-16: 1 via TOPICAL

## 2023-05-16 MED ORDER — VANCOMYCIN HCL 1000 MG IV SOLR
INTRAVENOUS | Status: AC
  Filled 2023-05-16: qty 20

## 2023-05-16 MED ORDER — VISTASEAL 10 ML SINGLE DOSE KIT
PACK | CUTANEOUS | Status: DC | PRN
  Administered 2023-05-16: 10 mL via TOPICAL

## 2023-05-16 MED ORDER — 0.9 % SODIUM CHLORIDE (POUR BTL) OPTIME
TOPICAL | Status: DC | PRN
  Administered 2023-05-16: 1000 mL

## 2023-05-16 MED ORDER — CEFAZOLIN SODIUM-DEXTROSE 2-4 GM/100ML-% IV SOLN
2.0000 g | INTRAVENOUS | Status: AC
  Administered 2023-05-16: 2 g via INTRAVENOUS

## 2023-05-16 MED ORDER — VISTASEAL 10 ML SINGLE DOSE KIT
PACK | CUTANEOUS | Status: AC
  Filled 2023-05-16: qty 10

## 2023-05-16 MED ORDER — MIDAZOLAM HCL 2 MG/2ML IJ SOLN
INTRAMUSCULAR | Status: DC | PRN
  Administered 2023-05-16: 2 mg via INTRAVENOUS

## 2023-05-16 MED ORDER — BACITRACIN ZINC 500 UNIT/GM EX OINT
TOPICAL_OINTMENT | CUTANEOUS | Status: AC
Start: 2023-05-16 — End: ?
  Filled 2023-05-16: qty 28.35

## 2023-05-16 SURGICAL SUPPLY — 36 items
BNDG COHESIVE 4X5 TAN STRL LF (GAUZE/BANDAGES/DRESSINGS) ×1 IMPLANT
BNDG ELASTIC 6X5.8 VLCR NS LF (GAUZE/BANDAGES/DRESSINGS) ×1 IMPLANT
BNDG GAUZE DERMACEA FLUFF 4 (GAUZE/BANDAGES/DRESSINGS) IMPLANT
BRUSH SCRUB EZ 4% CHG (MISCELLANEOUS) ×1 IMPLANT
DRAPE INCISE IOBAN 66X45 STRL (DRAPES) IMPLANT
ELECT CAUTERY BLADE 6.4 (BLADE) ×1 IMPLANT
ELECT REM PT RETURN 9FT ADLT (ELECTROSURGICAL) ×1
ELECTRODE REM PT RTRN 9FT ADLT (ELECTROSURGICAL) ×1 IMPLANT
GAUZE SPONGE 4X4 12PLY STRL (GAUZE/BANDAGES/DRESSINGS) IMPLANT
GAUZE XEROFORM 1X8 LF (GAUZE/BANDAGES/DRESSINGS) ×2 IMPLANT
GLOVE BIO SURGEON STRL SZ7 (GLOVE) ×2 IMPLANT
GOWN STRL REUS W/ TWL LRG LVL3 (GOWN DISPOSABLE) ×2 IMPLANT
GOWN STRL REUS W/TWL 2XL LVL3 (GOWN DISPOSABLE) ×1 IMPLANT
HANDLE YANKAUER SUCT BULB TIP (MISCELLANEOUS) ×1 IMPLANT
KIT STIMULAN RAPID CURE 5CC (Orthopedic Implant) IMPLANT
KIT TURNOVER KIT A (KITS) ×1 IMPLANT
LABEL OR SOLS (LABEL) ×1 IMPLANT
MANIFOLD NEPTUNE II (INSTRUMENTS) ×1 IMPLANT
MAT ABSORB FLUID 56X50 GRAY (MISCELLANEOUS) ×1 IMPLANT
NS IRRIG 1000ML POUR BTL (IV SOLUTION) ×1 IMPLANT
PACK EXTREMITY ARMC (MISCELLANEOUS) ×1 IMPLANT
PAD ABD DERMACEA PRESS 5X9 (GAUZE/BANDAGES/DRESSINGS) ×2 IMPLANT
PAD PREP OB/GYN DISP 24X41 (PERSONAL CARE ITEMS) ×1 IMPLANT
PULSAVAC PLUS IRRIG FAN TIP (DISPOSABLE) ×1
SPONGE T-LAP 18X18 ~~LOC~~+RFID (SPONGE) ×1 IMPLANT
STAPLER SKIN PROX 35W (STAPLE) ×1 IMPLANT
STOCKINETTE M/LG 89821 (MISCELLANEOUS) ×1 IMPLANT
SUT ETHILON 2 0 FS 18 (SUTURE) IMPLANT
SUT SILK 2 0 SH (SUTURE) ×2 IMPLANT
SUT SILK 2-0 18XBRD TIE 12 (SUTURE) ×1 IMPLANT
SUT SILK 3-0 18XBRD TIE 12 (SUTURE) ×1 IMPLANT
SUT VIC AB 0 CT1 36 (SUTURE) ×2 IMPLANT
SUT VIC AB 2-0 CT1 (SUTURE) ×2 IMPLANT
TIP FAN IRRIG PULSAVAC PLUS (DISPOSABLE) IMPLANT
TRAP FLUID SMOKE EVACUATOR (MISCELLANEOUS) ×1 IMPLANT
WATER STERILE IRR 500ML POUR (IV SOLUTION) ×1 IMPLANT

## 2023-05-16 NOTE — TOC Progression Note (Signed)
Transition of Care [facility]) - Progression Note    Patient Details  Name: Gerald Ellis MRN: [id number] Date of Birth: April 22, 1966  [facility] CM/SW Contact  Marlowe Sax, RN Phone Number: 05/16/2023, 9:49 AM  Clinical Narrative:    Plan remains to go to sister's house at [place] , [place] at [address], [place], [place] [address]. [facility] set up for [facility]  Right BKA yesterday TOC to continue to follow for additional needs  Expected Discharge Plan: Home w Home Health Services Barriers to Discharge: Continued Medical Work up  Expected Discharge Plan and Services       Living arrangements for the past 2 months: Single Family Home                                       Social Determinants of Health (SDOH) Interventions SDOH Screenings   Food Insecurity: No Food Insecurity (05/08/2023)  Recent Concern: Food Insecurity - Food Insecurity Present (02/18/2023)   Received from [facility]  Housing: Unknown (05/08/2023)  Transportation Needs: No Transportation Needs (05/08/2023)  Utilities: Not At Risk (05/08/2023)  Recent Concern: Utilities - At Risk (02/18/2023)   Received from [facility]  Financial Resource Strain: Low Risk  (02/18/2023)   Received from [facility]  Recent Concern: Financial Resource Strain - High Risk (12/24/2022)   Received from [facility]  Physical Activity: Insufficiently Active (04/20/2019)  Social Connections: Moderately Isolated (04/20/2019)  Stress: No Stress Concern Present (04/20/2019)  Tobacco Use: Low Risk  (05/15/2023)    Readmission Risk Interventions     No data to display

## 2023-05-16 NOTE — Progress Notes (Signed)
  Progress Note    05/16/2023 10:37 AM 1 Day Post-Op  Subjective: Gerald Ellis is a 57 year old male now postop day 1 from a right below the knee amputation.  Patient noted to have some bleeding overnight.  On exam this morning dressing was taken down patient noted to have hematoma that continues to ooze.  No other complaints overnight.  Patient's vitals are remained stable.   Vitals:   05/16/23 0441 05/16/23 0751  BP: 134/67 116/66  Pulse: 82 77  Resp: 20 16  Temp: 99.5 F (37.5 C) 98.4 F (36.9 C)  SpO2: 99% 97%   Physical Exam: Cardiac:  RRR, normal S1 and S2.  No murmurs appreciated. Lungs: Non-labored breathing, clear on auscultation throughout.  No rales rhonchi or wheezing noted. Incisions: Right lower extremity now below the knee amputation.  Noted bleeding overnight.  Dressing was soaked with blood.  Removed.  Patient noted to have hematoma and will be taken back to the operating room today to assess and washed wound out. Extremities:  Skin area to the incision line has some breakdown and some eschar.  Dr. Festus Barren MD to take the patient back to the operating room today 05/16/2023 for I&D and washout. Abdomen: Positive bowel sounds throughout, soft, nontender and nondistended. Neurologic: Alert and oriented x 3, answers all questions and follows commands appropriately.   CBC    Component Value Date/Time   WBC 9.5 05/16/2023 0541   RBC 3.18 (L) 05/16/2023 0541   HGB 8.4 (L) 05/16/2023 0901   HCT 25.1 (L) 05/16/2023 0901   HCT 48.9 07/31/2015 0838   PLT 368 05/16/2023 0541   MCV 85.5 05/16/2023 0541   MCH 28.6 05/16/2023 0541   MCHC 33.5 05/16/2023 0541   RDW 12.8 05/16/2023 0541   LYMPHSABS 1.8 05/07/2023 1921   MONOABS 0.7 05/07/2023 1921   EOSABS 0.1 05/07/2023 1921   BASOSABS 0.0 05/07/2023 1921    BMET    Component Value Date/Time   NA 133 (L) 05/16/2023 0541   K 3.9 05/16/2023 0541   CL 97 (L) 05/16/2023 0541   CO2 25 05/16/2023 0541   GLUCOSE  130 (H) 05/16/2023 0541   BUN 8 05/16/2023 0541   CREATININE 0.73 05/16/2023 0541   CALCIUM 8.3 (L) 05/16/2023 0541   GFRNONAA >60 05/16/2023 0541   GFRAA >60 04/21/2019 0515    INR    Component Value Date/Time   INR 1.0 03/21/2021 1849     Intake/Output Summary (Last 24 hours) at 05/16/2023 1037 Last data filed at 05/16/2023 0900 Gross per 24 hour  Intake 610.58 ml  Output 2700 ml  Net -2089.42 ml     Assessment/Plan:  57 y.o. male is s/p right lower extremity below the knee amputation.  1 Day Post-Op   Plan Vascular surgery plans on taking patient back to the operating room this afternoon on 05/16/2023 for wound washout and evaluate for bleeding. I discussed taking the patient back to the OR to evaluate and assess for bleeding.  Patient verbalizes understanding.  We discussed in detail the procedure, benefits, risk, complications.  Patient verbalized understanding wishes to proceed.  Answered all the patient's questions.  Patient was made NPO.  I discussed the plan in detail with Dr. Festus Barren MD and he agrees plan.   Marcie Bal [facility] 05/16/2023 10:37 AM

## 2023-05-16 NOTE — Op Note (Signed)
    OPERATIVE NOTE   PROCEDURE: Evacuation of hematoma, irrigation with pulse lavage irrigation, placement of vancomycin impregnated stimulant beads, revision and closure of right below-knee amputation  PRE-OPERATIVE DIAGNOSIS: right BKA hematoma.  POST-OPERATIVE DIAGNOSIS: Same as above  SURGEON: Festus Barren, MD  ASSISTANT(S): Rolla Plate, NP  ANESTHESIA: general  ESTIMATED BLOOD LOSS: 25 cc  FINDING(S): None  SPECIMEN(S):  none  INDICATIONS:   Gerald Ellis is a 57 y.o. male who presents with a hematoma of his right below-knee amputation performed earlier this week.  He is being taken back to the operating room to washout the hematoma.  An assistant was present during the procedure to help facilitate the exposure and expedite the procedure. .  DESCRIPTION: After obtaining full informed written consent, the patient was brought back to the operating room and placed supine upon the operating table.  The patient received IV antibiotics prior to induction.  After obtaining adequate anesthesia, the patient was prepped and draped in the standard fashion. The assistant provided retraction and mobilization to help facilitate exposure and expedite the procedure throughout the entire procedure.  This included following suture, using retractors, and optimizing lighting.   The wound was then opened and a moderate amount of hematoma was evacuated from the bed of the wound.  This was seen after the fascial closure and superficial closure were reopened.  The wound was then irrigated with copious pulse lavage irrigation.  There were really no large vessels that were bleeding and there was only moderate amount of medical oozing.  Any area that had bleeding was either controlled with a 3-0 silk suture ligature or electrocautery and pressure was held on the wound.  Vancomycin impregnated stimulant beads were then used and placed in the wound for antibiotic treatment.  10 cc of Vistaseal were then used in  the wound bed to help with hemostasis.  I then reclosed the wound with a series of interrupted figure-of-eight 0 Vicryl sutures for the fascia.  The superficial tissue was then closed with running 2-0 Vicryl.  The skin was then closed with staples and interrupted nylon sutures.  A large bulky bandage with Xeroform, ABDs, fluffed and wrapped Kerlix and an Ace wrap were then placed over the wound. The patient was then awakened from anesthesia and taken to the recovery room in stable condition having tolerated the procedure well.  COMPLICATIONS: none  CONDITION: stable  Festus Barren  05/16/2023, 2:05 PM   This note was created with Dragon Medical transcription system. Any errors in dictation are purely unintentional.

## 2023-05-16 NOTE — Transfer of Care (Signed)
Immediate Anesthesia Transfer of Care Note  Patient: Gerald Ellis  Procedure(s) Performed: REVISION OF BELOW KNEE AMPUTATION (Right: Knee)  Patient Location: PACU  Anesthesia Type:General  Level of Consciousness: awake, alert , and oriented  Airway & Oxygen Therapy: Patient Spontanous Breathing and Patient connected to face mask oxygen  Post-op Assessment: Report given to RN and Post -op Vital signs reviewed and stable  Post vital signs: Reviewed and stable  Last Vitals:  Vitals Value Taken Time  BP 164/90 05/16/23 1431  Temp    Pulse 97 05/16/23 1433  Resp 20 05/16/23 1433  SpO2 100 % 05/16/23 1433  Vitals shown include unfiled device data.  Last Pain:  Vitals:   05/16/23 1211  TempSrc:   PainSc: 8       Patients Stated Pain Goal: 0 (05/16/23 1000)  Complications: No notable events documented.

## 2023-05-16 NOTE — Interval H&P Note (Signed)
History and Physical Interval Note:  05/16/2023 12:52 PM  Gerald Ellis  has presented today for surgery, with the diagnosis of right foot gangrene.  The various methods of treatment have been discussed with the patient and family. After consideration of risks, benefits and other options for treatment, the patient has consented to  Procedure(s): REVISION OF BELOW KNEE AMPUTATION (Right) as a surgical intervention.  The patient's history has been reviewed, patient examined, patient has developed right BKA stump hematoma and needs this evacuated with revision of the BKA, stable for surgery.  I have reviewed the patient's chart and labs.  Questions were answered to the patient's satisfaction.     Festus Barren

## 2023-05-16 NOTE — Progress Notes (Signed)
PT Cancellation Note  Patient Details Name: Gerald Ellis MRN: [id number] DOB: [birth year]   Cancelled Treatment:    Reason Eval/Treat Not Completed: Medical issues which prohibited therapy Patient with significant bloody drainage saturating ACE wrap and bed linens. RN request to hold OOB attempts at this time and has notified MD. Will re-attempt at later date/time as appropriate.   Maylon Peppers, PT, DPT Physical Therapist - [facility]  [facility]    Karmina Zufall A Tenia Goh 05/16/2023, 10:25 AM

## 2023-05-16 NOTE — Progress Notes (Signed)
PROGRESS NOTE    Gerald Ellis   SEG:[id number] DOB: Feb 05, 1966  DOA: 05/07/2023 Date of Service: 05/16/23 which is hospital day 9  PCP: Jerl Mina, MD    HPI: Gerald Ellis is a 57 y.o. male with medical history significant for HTN, HLD, CAD s/p stent, DM with neuropathy, chronic back pain on opiates,, anxiety, chronic ulcer first right MTP, followed by podiatry (last seen 02/18/2023), a with normal resting ABI at the time and treated with topical dressings who presents to the ED on 05/07/23 with a 1 week history of blistering in the area of the wound and then over the past day he noted that his second and third toes of the right foot were turning black.   Hospital course / significant events:  12/19: admitted to hospitalist service, cefepime + vanc, podiatry and vascular consults, underwent transmetatarsal amputation R foot 12/20: underwent angiography RLE w/ stent placement  12/21: monitoring post-revascularization 12/22: podiatry reeval, planning to OR for washout/I&D tomorrow given sub-cutaneous gas on x-ray today. Wound Cx (+)mrsa, enterococcus  12/23: underwent irrigation/debridement R foot, washout  12/24: foot not healing / not salvageable, planning BKA.  12/25: stable, await BKA tomorrow  12/26: underwent BKA 12/27: bleeding from stump overnight, back to OR for revision d/t hematoma    Consultants:  Podiatry  Vascular Surgery  Infectious disease   Procedures/Surgeries: 05/08/2023: Transmetatarsal amputation right foot - Dr Alberteen Spindle  05/09/2023: RLE angioplasty w/ stent to superficial femoral, popliteal, anterior tibial arteries - Dr. Gilda Crease  05/12/2023: irrigation/debridement R foot, washout - Dr Alberteen Spindle  05/15/2023: R BKA - Dr Wyn Quaker 05/16/2023: Revision R BKA - Dr Wyn Quaker      ASSESSMENT & PLAN:   Gas gangrene R forefoot Diabetic foot infection (+)MRSA Sepsis d/t above  S/p Transmetatarsal amputation right foot 05/08/2023 S/p irrigation/debridement  05/12/2023  S/p R BKA 05/15/2023  ID following - continue abx vancomycin Pain control  Surgical interventions as above - currently s/p BKA RLE Bleeding from surgical site overnight, monitoring HH   R BKA stump hematoma S/p revision 05/16/2023 Vascular surgery monitoring  Follow HH  PAD  S/p RLE angioplasty w/ stent 05/09/2023 ASA, Prasugrel, statin Vascular surgery following   DM2, hyperglycemia  SSI + basal   Anxiety BuSPar   GERD PPI  Hypokalemia Replace as needed Monitor BMP  HTN CAD Metoprolol, Imdur, amlodipine Consider ACE/ARB ASA, Statin. Prasugrel  Debility, ambulatory dysfunction  HH PT/OT  Obesity Class 1 based on BMI: Body mass index is 33.72 kg/m.  Underweight - under 18  overweight - 25 to 29 obese - 30 or more Class 1 obesity: BMI of 30.0 to 34 Class 2 obesity: BMI of 35.0 to 39 Class 3 obesity: BMI of 40.0 to 49 Super Morbid Obesity: BMI 50-59 Super-super Morbid Obesity: BMI 60+ Significantly low or high BMI is associated with higher medical risk.  Weight management advised as adjunct to other disease management and risk reduction treatments    DVT prophylaxis: lovenox  IV fluids: no continuous IV fluids  Nutrition: diabetic diet  Central lines / invasive devices: none  Code Status: FULL CODE ACP documentation reviewed:  none on file in [facility]  TOC needs: pend PT/OT reeval, may need SNF/rehab now that has BKA  Barriers to dispo / significant pending items: PT/OT today if able, there was some bleeding from stump overnight, pending surgeon reeval              Subjective / Brief ROS:  Patient reports no concerns at  this time  Examined in hospital room, postop  No fever/chills  Denies CP/SOB.  He jsut got some pain medication, it is kicking in  Denies new weakness.   Family Communication: family at bedside on rounds     Objective Findings:  Vitals:   05/16/23 1431 05/16/23 1445 05/16/23 1500 05/16/23 1517  BP: (!)  164/90 (!) 163/94 (!) 183/87 137/81  Pulse: 97 96 95 95  Resp: 12 (!) 30 14 (!) 23  Temp: 98.4 F (36.9 C)  98.2 F (36.8 C)   TempSrc:      SpO2: 94% 96% 91% 94%  Weight:      Height:        Intake/Output Summary (Last 24 hours) at 05/16/2023 1742 Last data filed at 05/16/2023 1459 Gross per 24 hour  Intake 950 ml  Output 1350 ml  Net -400 ml   Filed Weights   05/07/23 1918 05/12/23 1519 05/15/23 0806  Weight: 106.6 kg 106.6 kg 106.6 kg    Examination:  Physical Exam Constitutional:      General: He is not in acute distress.    Appearance: He is not ill-appearing.  Cardiovascular:     Rate and Rhythm: Normal rate and regular rhythm.  Pulmonary:     Effort: Pulmonary effort is normal.     Breath sounds: Normal breath sounds.  Musculoskeletal:     Comments: RLE stump is wrapped, no bleeding apparent  Neurological:     General: No focal deficit present.     Mental Status: He is alert and oriented to person, place, and time.  Psychiatric:        Mood and Affect: Mood normal.        Behavior: Behavior normal.          Scheduled Medications:   amLODipine  5 mg Oral Daily   aspirin  81 mg Oral Daily   atorvastatin  80 mg Oral q1800   busPIRone  7.5 mg Oral BID   feeding supplement  237 mL Oral BID BM   HYDROcodone-acetaminophen  2 tablet Oral Q4H   insulin aspart  0-20 Units Subcutaneous Q4H   insulin aspart  3 Units Subcutaneous TID WC   insulin glargine-yfgn  38 Units Subcutaneous QHS   isosorbide mononitrate  30 mg Oral Daily   metoprolol succinate  50 mg Oral Daily   omega-3 acid ethyl esters  1 g Oral BID   pantoprazole  40 mg Oral BID   polyethylene glycol  17 g Oral Daily   [START ON 05/18/2023] prasugrel  10 mg Oral Daily   sodium chloride flush  3 mL Intravenous Q12H   sodium chloride flush  3-10 mL Intravenous Q12H    Continuous Infusions:  vancomycin Stopped (05/16/23 1629)    PRN Medications:  acetaminophen **OR** acetaminophen,  cyclobenzaprine, HYDROcodone-acetaminophen, ipratropium-albuterol, magnesium hydroxide, morphine injection, ondansetron **OR** ondansetron (ZOFRAN) IV, phenol, sodium chloride flush, sodium chloride flush  Antimicrobials from admission:  Anti-infectives (From admission, onward)    Start     Dose/Rate Route Frequency Ordered Stop   05/16/23 1227  ceFAZolin (ANCEF) IVPB 2g/100 mL premix        2 g 200 mL/hr over 30 Minutes Intravenous 30 min pre-op 05/16/23 1227 05/16/23 1329   05/16/23 0000  vancomycin (VANCOREADY) IVPB 1250 mg/250 mL        1,250 mg 166.7 mL/hr over 90 Minutes Intravenous Every 12 hours 05/15/23 1633 05/17/23 2359   05/14/23 0000  Vancomycin (VANCOCIN) 1,250 mg in sodium  chloride 0.9 % 250 mL IVPB  Status:  Discontinued        1,250 mg 166.7 mL/hr over 90 Minutes Intravenous Every 12 hours 05/13/23 1626 05/15/23 1633   05/13/23 1442  ceFAZolin (ANCEF) IVPB 2g/100 mL premix        2 g 200 mL/hr over 30 Minutes Intravenous 30 min pre-op 05/13/23 1442 05/15/23 0933   05/12/23 0000  vancomycin (VANCOREADY) IVPB 1250 mg/250 mL  Status:  Discontinued        1,250 mg 166.7 mL/hr over 90 Minutes Intravenous Every 12 hours 05/11/23 1345 05/14/23 0103   05/08/23 1000  vancomycin (VANCOCIN) IVPB 1000 mg/200 mL premix  Status:  Discontinued        1,000 mg 200 mL/hr over 60 Minutes Intravenous Every 12 hours 05/07/23 2251 05/11/23 1345   05/08/23 0400  ceFEPIme (MAXIPIME) 2 g in sodium chloride 0.9 % 100 mL IVPB  Status:  Discontinued        2 g 200 mL/hr over 30 Minutes Intravenous Every 8 hours 05/07/23 2252 05/13/23 0804   05/07/23 2230  ceFEPIme (MAXIPIME) 2 g in sodium chloride 0.9 % 100 mL IVPB  Status:  Discontinued        2 g 200 mL/hr over 30 Minutes Intravenous  Once 05/07/23 2222 05/07/23 2251   05/07/23 2230  metroNIDAZOLE (FLAGYL) IVPB 500 mg  Status:  Discontinued        500 mg 100 mL/hr over 60 Minutes Intravenous Every 12 hours 05/07/23 2222 05/08/23 0953    05/07/23 2230  vancomycin (VANCOCIN) IVPB 1000 mg/200 mL premix  Status:  Discontinued        1,000 mg 200 mL/hr over 60 Minutes Intravenous  Once 05/07/23 2222 05/07/23 2224   05/07/23 2230  Vancomycin (VANCOCIN) 1,500 mg in sodium chloride 0.9 % 500 mL IVPB        1,500 mg 250 mL/hr over 120 Minutes Intravenous  Once 05/07/23 2225 05/08/23 0312   05/07/23 2130  vancomycin (VANCOCIN) IVPB 1000 mg/200 mL premix        1,000 mg 200 mL/hr over 60 Minutes Intravenous  Once 05/07/23 2128 05/08/23 0114   05/07/23 2130  piperacillin-tazobactam (ZOSYN) IVPB 3.375 g        3.375 g 100 mL/hr over 30 Minutes Intravenous  Once 05/07/23 2128 05/07/23 2216           Data Reviewed:  I have personally reviewed the following...  CBC: Recent Labs  Lab 05/10/23 0412 05/11/23 0246 05/14/23 0449 05/15/23 0403 05/16/23 0541 05/16/23 0901  WBC 12.9* 10.5 9.2 8.2 9.5  --   HGB 9.7* 9.5* 9.3* 9.4* 9.1* 8.4*  HCT 28.4* 28.3* 27.8* 28.5* 27.2* 25.1*  MCV 84.3 86.5 84.8 87.4 85.5  --   PLT 397 362 383 339 368  --    Basic Metabolic Panel: Recent Labs  Lab 05/10/23 0412 05/11/23 0246 05/14/23 0449 05/15/23 0403 05/16/23 0541  NA 133* 132* 137 132* 133*  K 3.5 3.6 3.1* 3.7 3.9  CL 102 101 103 99 97*  CO2 25 23 26 25 25   GLUCOSE 216* 172* 113* 218* 130*  BUN 20 19 12 14 8   CREATININE 0.86 0.93 0.87 0.79 0.73  CALCIUM 7.8* 7.6* 8.0* 7.8* 8.3*   GFR: Estimated Creatinine Clearance: 126 mL/min (by C-G formula based on SCr of 0.73 mg/dL). Liver Function Tests: No results for input(s): "AST", "ALT", "ALKPHOS", "BILITOT", "PROT", "ALBUMIN" in the last 168 hours.  No results for input(s): "LIPASE", "  AMYLASE" in the last 168 hours. No results for input(s): "AMMONIA" in the last 168 hours. Coagulation Profile: No results for input(s): "INR", "PROTIME" in the last 168 hours. Cardiac Enzymes: No results for input(s): "CKTOTAL", "CKMB", "CKMBINDEX", "TROPONINI" in the last 168 hours. BNP  (last 3 results) No results for input(s): "PROBNP" in the last 8760 hours. HbA1C: No results for input(s): "HGBA1C" in the last 72 hours. CBG: Recent Labs  Lab 05/16/23 0437 05/16/23 0752 05/16/23 1129 05/16/23 1434 05/16/23 1701  GLUCAP 115* 115* 147* 184* 258*   Lipid Profile: No results for input(s): "CHOL", "HDL", "LDLCALC", "TRIG", "CHOLHDL", "LDLDIRECT" in the last 72 hours. Thyroid Function Tests: No results for input(s): "TSH", "T4TOTAL", "FREET4", "T3FREE", "THYROIDAB" in the last 72 hours. Anemia Panel: No results for input(s): "VITAMINB12", "FOLATE", "FERRITIN", "TIBC", "IRON", "RETICCTPCT" in the last 72 hours. Most Recent Urinalysis On File:     Component Value Date/Time   COLORURINE YELLOW (A) 06/12/2015 1252   APPEARANCEUR CLEAR (A) 06/12/2015 1252   LABSPEC 1.042 (H) 06/12/2015 1252   PHURINE 6.0 06/12/2015 1252   GLUCOSEU >500 (A) 06/12/2015 1252   HGBUR NEGATIVE 06/12/2015 1252   BILIRUBINUR NEGATIVE 06/12/2015 1252   KETONESUR TRACE (A) 06/12/2015 1252   PROTEINUR 100 (A) 06/12/2015 1252   NITRITE NEGATIVE 06/12/2015 1252   LEUKOCYTESUR NEGATIVE 06/12/2015 1252   Sepsis Labs: @LABRCNTIP (procalcitonin:4,lacticidven:4) Microbiology: Recent Results (from the past 240 hours)  Blood culture (single)     Status: None   Collection Time: 05/07/23  7:21 PM   Specimen: BLOOD  Result Value Ref Range Status   Specimen Description BLOOD LEFT FOREARM  Final   Special Requests   Final    BOTTLES DRAWN AEROBIC AND ANAEROBIC Blood Culture adequate volume   Culture   Final    NO GROWTH 5 DAYS Performed at [facility], [address], [place], [place] [address]    Report Status 05/12/2023 FINAL  Final  Surgical pcr screen     Status: Abnormal   Collection Time: 05/08/23  8:51 AM   Specimen: Nasal Mucosa; Nasal Swab  Result Value Ref Range Status   MRSA, PCR NEGATIVE NEGATIVE Final   Staphylococcus aureus POSITIVE (A) NEGATIVE Final    Comment:  (NOTE) The Xpert SA Assay (FDA approved for NASAL specimens in patients 15 years of age and older), is one component of a comprehensive surveillance program. It is not intended to diagnose infection nor to guide or monitor treatment. Performed at [facility], [address], [place], [place] [address]   Aerobic/Anaerobic Culture w Gram Stain (surgical/deep wound)     Status: None   Collection Time: 05/08/23 11:54 AM   Specimen: Path Tissue  Result Value Ref Range Status   Specimen Description   Final    TISSUE Performed at [facility], [address] [place], [place] [address]    Special Requests SWAB OF RIGHT FOOT DEEP ABSCESS  Final   Gram Stain NO WBC SEEN FEW GRAM POSITIVE COCCI IN PAIRS   Final   Culture   Final    FEW ENTEROCOCCUS FAECALIS FEW METHICILLIN RESISTANT STAPHYLOCOCCUS AUREUS NO ANAEROBES ISOLATED Performed at [facility], [address] [place], [place] [address]    Report Status 05/13/2023 FINAL  Final   Organism ID, Bacteria ENTEROCOCCUS FAECALIS  Final   Organism ID, Bacteria METHICILLIN RESISTANT STAPHYLOCOCCUS AUREUS  Final      Susceptibility   Enterococcus faecalis - MIC*    AMPICILLIN <=2 SENSITIVE Sensitive  VANCOMYCIN 2 SENSITIVE Sensitive     GENTAMICIN SYNERGY SENSITIVE Sensitive     * FEW ENTEROCOCCUS FAECALIS   Methicillin resistant staphylococcus aureus - MIC*    CIPROFLOXACIN 2 INTERMEDIATE Intermediate     ERYTHROMYCIN >=8 RESISTANT Resistant     GENTAMICIN <=0.5 SENSITIVE Sensitive     OXACILLIN RESISTANT Resistant     TETRACYCLINE <=1 SENSITIVE Sensitive     VANCOMYCIN <=0.5 SENSITIVE Sensitive     TRIMETH/SULFA <=10 SENSITIVE Sensitive     CLINDAMYCIN 2 INTERMEDIATE Intermediate     RIFAMPIN <=0.5 SENSITIVE Sensitive     Inducible Clindamycin NEGATIVE Sensitive     LINEZOLID 2 SENSITIVE Sensitive     * FEW METHICILLIN RESISTANT STAPHYLOCOCCUS AUREUS  Aerobic/Anaerobic Culture w Gram Stain  (surgical/deep wound)     Status: None (Preliminary result)   Collection Time: 05/12/23  4:29 PM   Specimen: Path Tissue  Result Value Ref Range Status   Specimen Description   Final    TISSUE Performed at [facility], [address] [place], [place] [address]    Special Requests   Final    NONE Performed at [facility], [address] [place], [place] [address]    Gram Stain   Final    RARE WBC PRESENT,BOTH PMN AND MONONUCLEAR NO ORGANISMS SEEN Performed at [facility], [address], [place], [place] [address]    Culture   Final    RARE ENTEROCOCCUS FAECALIS CRITICAL RESULT CALLED TO, READ BACK BY AND VERIFIED WITH: RN kelly h. [phone] fcp NO ANAEROBES ISOLATED; CULTURE IN PROGRESS FOR 5 DAYS    Report Status PENDING  Incomplete   Organism ID, Bacteria ENTEROCOCCUS FAECALIS  Final      Susceptibility   Enterococcus faecalis - MIC*    AMPICILLIN <=2 SENSITIVE Sensitive     VANCOMYCIN 2 SENSITIVE Sensitive     GENTAMICIN SYNERGY SENSITIVE Sensitive     * RARE ENTEROCOCCUS FAECALIS      Radiology Studies last 3 days: DG Foot Complete Right Result Date: 05/13/2023 CLINICAL DATA:  Postop check status post transmetatarsal amputation and debridement of the mid calf. EXAM: RIGHT FOOT COMPLETE - 3+ VIEW; RIGHT TIBIA AND FIBULA - 2 VIEW COMPARISON:  Foot radiographs 05/11/2023 and 05/07/2023. MRI 05/07/2023. FINDINGS: Right tibia and fibula: The mineralization and alignment are normal. There is no evidence of acute fracture or dislocation. The joint spaces appear preserved at the knee. There are vascular stents posterior to the distal right femur and within the popliteal fossa. No focal soft tissue abnormalities are identified allowing for artifact associated with overlying sheets or clothing. Right foot: Status post transmetatarsal amputation through the bases of all the metatarsals, unchanged from the prior study of 2 days earlier. No progressive bone  destruction. A small amount of gas is present within the distal soft tissues, nonspecific. Skin staples are in place. IMPRESSION: 1. Stable postoperative appearance of the right foot status post transmetatarsal amputation. No evidence of progressive bone destruction. 2. No acute osseous findings in the right lower leg. Electronically Signed   By: Carey Bullocks M.D.   On: 05/13/2023 13:52   DG Tibia/Fibula Right Result Date: 05/13/2023 CLINICAL DATA:  Postop check status post transmetatarsal amputation and debridement of the mid calf. EXAM: RIGHT FOOT COMPLETE - 3+ VIEW; RIGHT TIBIA AND FIBULA - 2 VIEW COMPARISON:  Foot radiographs 05/11/2023 and 05/07/2023. MRI 05/07/2023. FINDINGS: Right tibia and fibula: The mineralization and alignment are normal. There is no  evidence of acute fracture or dislocation. The joint spaces appear preserved at the knee. There are vascular stents posterior to the distal right femur and within the popliteal fossa. No focal soft tissue abnormalities are identified allowing for artifact associated with overlying sheets or clothing. Right foot: Status post transmetatarsal amputation through the bases of all the metatarsals, unchanged from the prior study of 2 days earlier. No progressive bone destruction. A small amount of gas is present within the distal soft tissues, nonspecific. Skin staples are in place. IMPRESSION: 1. Stable postoperative appearance of the right foot status post transmetatarsal amputation. No evidence of progressive bone destruction. 2. No acute osseous findings in the right lower leg. Electronically Signed   By: Carey Bullocks M.D.   On: 05/13/2023 13:52        Sunnie Nielsen, DO [facility] 05/16/2023, 5:42 PM    Dictation software may have been used to generate the above note. Typos may occur and escape review in typed/dictated notes. Please contact Dr Lyn Hollingshead directly for clarity if needed.  Staff may message me via secure chat in  Epic  but this may not receive an immediate response,  please page me for urgent matters!  If 7PM-7AM, please contact night coverage [email or web address]

## 2023-05-16 NOTE — Anesthesia Procedure Notes (Signed)
Procedure Name: Intubation Date/Time: 05/16/2023 1:12 PM  Performed by: Joanette Gula, Kalila Adkison, CRNAPre-anesthesia Checklist: Patient identified, Emergency Drugs available, Suction available and Patient being monitored Patient Re-evaluated:Patient Re-evaluated prior to induction Oxygen Delivery Method: Circle system utilized Preoxygenation: Pre-oxygenation with 100% oxygen Induction Type: IV induction, Rapid sequence and Cricoid Pressure applied Laryngoscope Size: McGrath and 4 Grade View: Grade I Tube type: Oral Tube size: 7.0 mm Number of attempts: 1 Airway Equipment and Method: Stylet and Oral airway Placement Confirmation: ETT inserted through vocal cords under direct vision, positive ETCO2 and breath sounds checked- equal and bilateral Secured at: 22 cm Tube secured with: Tape Dental Injury: Teeth and Oropharynx as per pre-operative assessment

## 2023-05-16 NOTE — Progress Notes (Signed)
Pharmacy Antibiotic Note  Gerald Ellis is a 57 y.o. male admitted on 05/07/2023 with  diabetic foot infection . 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 Per surgery patient may require BKA but vascular status continues to improve significantly. PMH includes CAD s/p stent, HTN, DM with neuropathy, chronic back pain on opiates, anxiety. Pharmacy has been consulted for vancomycin dosing. Day 5 of antibiotics. Tissue culture of right foot identified E. faecalis and MRSA with sensitivities to vancomycin on 05/11/2023. Scr WNL, WBC trending down. Vancomycin peak and trough levels drawn on 12/22.   Plan: - Continue vancomycin to 1250 mg q12h for new calculated AUC of 472.  - Monitor Scr and adjust dosage as needed  - Continue cefepime 2 g q8h  - Patient had BKA 12/26, stop date of 12/28 after 1200 dose has been added per discussion with provider.   Height: 5\' 10"  (177.8 cm) Weight: 106.6 kg (235 lb 0.2 oz) IBW/kg (Calculated) : 73  Temp (24hrs), Avg:98.7 F (37.1 C), Min:98 F (36.7 C), Max:99.6 F (37.6 C)  Recent Labs  Lab 05/10/23 0412 05/11/23 0246 05/11/23 1207 05/14/23 0449 05/15/23 0403 05/16/23 0541  WBC 12.9* 10.5  --  9.2 8.2 9.5  CREATININE 0.86 0.93  --  0.87 0.79 0.73  VANCOTROUGH  --   --  9*  --   --   --   VANCOPEAK  --  22*  --   --   --   --     Estimated Creatinine Clearance: 126 mL/min (by C-G formula based on SCr of 0.73 mg/dL).    Allergies  Allergen Reactions   Ibuprofen Shortness Of Breath   Nsaids Swelling    Swelling of throat    Antimicrobials this admission: Zosyn 12/18 x 1 dose Cefepime 12/19 >> Metronidazole 12/19 x 1 dose Vancomycin 12/18 >>  Dose adjustments this admission: 12/22 vancomycin increased to 1250 mg q12h   Microbiology results: 12/18 BCx: no growth 12/19 right foot tissue Cx: E. Faecalis (S = amp and vanco) and MRSA (S = Vanco, clindamycin, SMX/TMP, and Linezolid) 12/19 MRSA PCR:  positive   Barrie Folk, PharmD 05/16/2023 10:32 AM

## 2023-05-16 NOTE — Anesthesia Preprocedure Evaluation (Addendum)
Anesthesia Evaluation  Patient identified by MRN, date of birth, ID band Patient awake    Reviewed: Allergy & Precautions, NPO status , Patient's Chart, lab work & pertinent test results  Airway Mallampati: III  TM Distance: >3 FB Neck ROM: full    Dental  (+) Missing, Poor Dentition   Pulmonary neg pulmonary ROS, asthma    Pulmonary exam normal breath sounds clear to auscultation       Cardiovascular Exercise Tolerance: Good + CAD and + Peripheral Vascular Disease  negative cardio ROS Normal cardiovascular exam Rhythm:Regular Rate:Normal     Neuro/Psych  Headaches   Depression    negative neurological ROS  negative psych ROS   GI/Hepatic negative GI ROS, Neg liver ROS,,,  Endo/Other  negative endocrine ROSdiabetes, Type 2, Insulin Dependent  Class 3 obesity  Renal/GU negative Renal ROS  negative genitourinary   Musculoskeletal   Abdominal  (+) + obese  Peds negative pediatric ROS (+)  Hematology negative hematology ROS (+)   Anesthesia Other Findings Past Medical History: No date: Benign enlargement of prostate No date: Bronchitis No date: Childhood asthma No date: Diabetes mellitus without complication (HCC) No date: Environmental allergies No date: Erectile dysfunction No date: Headache No date: Hemorrhoids No date: Hypercholesteremia No date: Hypertension No date: Hypogonadism in male No date: Lumbago No date: Over weight  Past Surgical History: 05/15/2023: AMPUTATION; Right     Comment:  Procedure: AMPUTATION BELOW KNEE;  Surgeon: Annice Needy, MD;  Location: [facility] ORS;  Service: General;                Laterality: Right; 1992: BACK SURGERY No date: CHOLECYSTECTOMY 08/07/2015: COLONOSCOPY WITH PROPOFOL; N/A     Comment:  Procedure: COLONOSCOPY WITH PROPOFOL;  Surgeon: Christena Deem, MD;  Location: [facility] ENDOSCOPY;  Service:               Endoscopy;  Laterality:  N/A; 08/08/2015: COLONOSCOPY WITH PROPOFOL; N/A     Comment:  Procedure: COLONOSCOPY WITH PROPOFOL;  Surgeon: Christena Deem, MD;  Location: [facility] ENDOSCOPY;  Service:               Endoscopy;  Laterality: N/A; 09/16/2017: COLONOSCOPY WITH PROPOFOL; N/A     Comment:  Procedure: COLONOSCOPY WITH PROPOFOL;  Surgeon:               Christena Deem, MD;  Location: [facility] ENDOSCOPY;                Service: Endoscopy;  Laterality: N/A; 12/20/2020: COLONOSCOPY WITH PROPOFOL; N/A     Comment:  Procedure: COLONOSCOPY WITH PROPOFOL;  Surgeon: Earline Mayotte, MD;  Location: [facility] ENDOSCOPY;  Service:               Endoscopy;  Laterality: N/A;  IDDM 04/20/2019: CORONARY STENT INTERVENTION; N/A     Comment:  Procedure: CORONARY STENT INTERVENTION;  Surgeon:               Marcina Millard, MD;  Location: [facility] INVASIVE CV               LAB;  Service: Cardiovascular;  Laterality: N/A; 08/07/2015: ESOPHAGOGASTRODUODENOSCOPY (EGD) WITH PROPOFOL; N/A  Comment:  Procedure: ESOPHAGOGASTRODUODENOSCOPY (EGD) WITH               PROPOFOL;  Surgeon: Christena Deem, MD;  Location:               [facility] ENDOSCOPY;  Service: Endoscopy;  Laterality: N/A; 09/16/2017: ESOPHAGOGASTRODUODENOSCOPY (EGD) WITH PROPOFOL; N/A     Comment:  Procedure: ESOPHAGOGASTRODUODENOSCOPY (EGD) WITH               PROPOFOL;  Surgeon: Christena Deem, MD;  Location:               [facility] ENDOSCOPY;  Service: Endoscopy;  Laterality: N/A; 12/20/2020: ESOPHAGOGASTRODUODENOSCOPY (EGD) WITH PROPOFOL; N/A     Comment:  Procedure: ESOPHAGOGASTRODUODENOSCOPY (EGD) WITH               PROPOFOL;  Surgeon: Earline Mayotte, MD;  Location:               [facility] ENDOSCOPY;  Service: Endoscopy;  Laterality: N/A; 05/12/2023: IRRIGATION AND DEBRIDEMENT FOOT; Right     Comment:  Procedure: IRRIGATION AND DEBRIDEMENT FOOT, WASH OUT;                Surgeon: Linus Galas, DPM;  Location: [facility] ORS;  Service:               Orthopedics/Podiatry;  Laterality: Right; 04/20/2019: LEFT HEART CATH AND CORONARY ANGIOGRAPHY; Left     Comment:  Procedure: LEFT HEART CATH AND CORONARY ANGIOGRAPHY;                Surgeon: Marcina Millard, MD;  Location: [facility]               INVASIVE CV LAB;  Service: Cardiovascular;  Laterality:               Left; 05/09/2023: LOWER EXTREMITY ANGIOGRAPHY; Right     Comment:  Procedure: Lower Extremity Angiography;  Surgeon:               Renford Dills, MD;  Location: [facility] INVASIVE CV LAB;               Service: Cardiovascular;  Laterality: Right; 05/08/2023: TRANSMETATARSAL AMPUTATION; Right     Comment:  Procedure: TRANSMETATARSAL AMPUTATION;  Surgeon: Linus Galas, DPM;  Location: [facility] ORS;  Service:               Orthopedics/Podiatry;  Laterality: Right;  BMI    Body Mass Index: 33.72 kg/m      Reproductive/Obstetrics negative OB ROS                             Anesthesia Physical Anesthesia Plan  ASA: 3 and emergent  Anesthesia Plan: General   Post-op Pain Management:    Induction: Intravenous  PONV Risk Score and Plan: Ondansetron, Dexamethasone, Midazolam and Treatment may vary due to age or medical condition  Airway Management Planned: Oral ETT  Additional Equipment:   Intra-op Plan:   Post-operative Plan: Extubation in OR  Informed Consent: I have reviewed the patients History and Physical, chart, labs and discussed the procedure including the risks, benefits and alternatives for the proposed anesthesia with the patient or authorized representative who has indicated his/her understanding and acceptance.     Dental Advisory Given  Plan Discussed with: CRNA  Anesthesia Plan Comments:  Anesthesia Quick Evaluation

## 2023-05-17 DIAGNOSIS — L089 Local infection of the skin and subcutaneous tissue, unspecified: Secondary | ICD-10-CM | POA: Diagnosis not present

## 2023-05-17 DIAGNOSIS — E11628 Type 2 diabetes mellitus with other skin complications: Secondary | ICD-10-CM | POA: Diagnosis not present

## 2023-05-17 LAB — AEROBIC/ANAEROBIC CULTURE W GRAM STAIN (SURGICAL/DEEP WOUND)

## 2023-05-17 LAB — GLUCOSE, CAPILLARY
Glucose-Capillary: 122 mg/dL — ABNORMAL HIGH (ref 70–99)
Glucose-Capillary: 142 mg/dL — ABNORMAL HIGH (ref 70–99)
Glucose-Capillary: 190 mg/dL — ABNORMAL HIGH (ref 70–99)
Glucose-Capillary: 209 mg/dL — ABNORMAL HIGH (ref 70–99)
Glucose-Capillary: 215 mg/dL — ABNORMAL HIGH (ref 70–99)
Glucose-Capillary: 259 mg/dL — ABNORMAL HIGH (ref 70–99)

## 2023-05-17 MED ORDER — METHOCARBAMOL 1000 MG/10ML IJ SOLN
1000.0000 mg | Freq: Three times a day (TID) | INTRAMUSCULAR | Status: AC
  Administered 2023-05-17 – 2023-05-18 (×3): 1000 mg via INTRAVENOUS
  Filled 2023-05-17 (×3): qty 10

## 2023-05-17 NOTE — Progress Notes (Signed)
PROGRESS NOTE    Gerald Ellis   MWN:[id number] DOB: [birth year]  DOA: 05/07/2023 Date of Service: 05/17/23 which is hospital day 10  PCP: Jerl Mina, MD    HPI: Gerald Ellis is a 57 y.o. male with medical history significant for HTN, HLD, CAD s/p stent, DM with neuropathy, chronic back pain on opiates,, anxiety, chronic ulcer first right MTP, followed by podiatry (last seen 02/18/2023), a with normal resting ABI at the time and treated with topical dressings who presents to the ED on 05/07/23 with a 1 week history of blistering in the area of the wound and then over the past day he noted that his second and third toes of the right foot were turning black.   Hospital course / significant events:  12/19: admitted to hospitalist service, cefepime + vanc, podiatry and vascular consults, underwent transmetatarsal amputation R foot 12/20: underwent angiography RLE w/ stent placement  12/21: monitoring post-revascularization 12/22: podiatry reeval, planning to OR for washout/I&D tomorrow given sub-cutaneous gas on x-ray today. Wound Cx (+)mrsa, enterococcus  12/23: underwent irrigation/debridement R foot, washout  12/24: foot not healing / not salvageable, planning BKA.  12/25: stable, await BKA tomorrow  12/26: underwent BKA 12/27: bleeding from stump overnight, back to OR for revision d/t hematoma   12/28: stable, planning for bandage change Mon 12/30  Consultants:  Podiatry  Vascular Surgery  Infectious disease   Procedures/Surgeries: 05/08/2023: Transmetatarsal amputation right foot - Dr Alberteen Spindle  05/09/2023: RLE angioplasty w/ stent to superficial femoral, popliteal, anterior tibial arteries - Dr. 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 Prasugrel  Debility, ambulatory dysfunction  HH PT/OT  R BKA stump hematoma - corrected w/ revision surgery 12/27 S/p revision 05/16/2023 Vascular surgery monitoring  Follow HH  Obesity Class 1 based on BMI: Body mass index is 33.72 kg/m.  Underweight - under 18  overweight - 25 to 29 obese - 30 or more Class 1 obesity: BMI of 30.0 to 34 Class 2 obesity: BMI of 35.0 to 39 Class 3 obesity: BMI of 40.0 to 49 Super Morbid Obesity: BMI 50-59 Super-super Morbid Obesity: BMI 60+ Significantly low or high BMI is associated with higher medical risk.  Weight management advised as adjunct to other disease management and risk reduction treatments    DVT prophylaxis: lovenox  IV fluids: no continuous IV fluids  Nutrition: diabetic diet  Central lines / invasive devices: none  Code Status: FULL CODE ACP documentation reviewed:  none on file in [facility]  TOC needs: pend PT/OT reeval, may need SNF/rehab now that has BKA  Barriers to dispo / significant pending items: PT/OT today if able, there was some bleeding from stump overnight, pending surgeon reeval  Subjective / Brief ROS:  Patient reports cramping in RLE has been pretty bad, pain is not well controlled  No fever/chills  Denies CP/SOB.  Denies new weakness.   Family Communication: family at bedside on rounds     Objective Findings:  Vitals:   05/16/23 1953 05/17/23 0006 05/17/23 0404  05/17/23 0735  BP: 118/72 123/68 126/65 137/69  Pulse: 94 91 88 90  Resp: 19 20 20 16   Temp: 98.7 F (37.1 C) 99.4 F (37.4 C) 97.7 F (36.5 C) 98.8 F (37.1 C)  TempSrc:      SpO2: 95% 92% 94% 98%  Weight:      Height:        Intake/Output Summary (Last 24 hours) at 05/17/2023 1204 Last data filed at 05/17/2023 8469 Gross per 24 hour  Intake 710 ml  Output 1675 ml  Net -965 ml   Filed Weights   05/07/23 1918 05/12/23 1519 05/15/23 0806  Weight: 106.6 kg 106.6 kg 106.6 kg    Examination:  Physical Exam Constitutional:      General: He is not in acute distress.    Appearance: He is not ill-appearing.  Cardiovascular:     Rate and Rhythm: Normal rate and regular rhythm.  Pulmonary:     Effort: Pulmonary effort is normal.     Breath sounds: Normal breath sounds.  Musculoskeletal:     Comments: RLE stump is wrapped, no bleeding apparent  Neurological:     General: No focal deficit present.     Mental Status: He is alert and oriented to person, place, and time.  Psychiatric:        Mood and Affect: Mood normal.        Behavior: Behavior normal.          Scheduled Medications:   amLODipine  5 mg Oral Daily   aspirin  81 mg Oral Daily   atorvastatin  80 mg Oral q1800   busPIRone  7.5 mg Oral BID   feeding supplement  237 mL Oral BID BM   insulin aspart  0-20 Units Subcutaneous Q4H   insulin aspart  3 Units Subcutaneous TID WC   insulin glargine-yfgn  38 Units Subcutaneous QHS   isosorbide mononitrate  30 mg Oral Daily   methocarbamol (ROBAXIN) injection  1,000 mg Intravenous Q8H   metoprolol succinate  50 mg Oral Daily   omega-3 acid ethyl esters  1 g Oral BID   pantoprazole  40 mg Oral BID   polyethylene glycol  17 g Oral Daily   [START ON 05/18/2023] prasugrel  10 mg Oral Daily   sodium chloride flush  3 mL Intravenous Q12H   sodium chloride flush  3-10 mL Intravenous Q12H    Continuous Infusions:  vancomycin 1,250 mg (05/17/23 0059)    PRN  Medications:  acetaminophen **OR** acetaminophen, HYDROcodone-acetaminophen, ipratropium-albuterol, magnesium hydroxide, morphine injection, ondansetron **OR** ondansetron (ZOFRAN) IV, phenol, sodium chloride flush, sodium chloride flush  Antimicrobials from admission:  Anti-infectives (From admission, onward)    Start     Dose/Rate Route Frequency Ordered Stop   05/16/23 1227  ceFAZolin (ANCEF) IVPB 2g/100 mL premix        2 g 200 mL/hr over 30 Minutes Intravenous 30 min pre-op 05/16/23 1227 05/16/23 1329   05/16/23 0000  vancomycin (VANCOREADY) IVPB 1250 mg/250 mL        1,250 mg 166.7 mL/hr over 90 Minutes Intravenous Every 12 hours 05/15/23 1633 05/17/23 2359   05/14/23 0000  Vancomycin (VANCOCIN) 1,250  mg in sodium chloride 0.9 % 250 mL IVPB  Status:  Discontinued        1,250 mg 166.7 mL/hr over 90 Minutes Intravenous Every 12 hours 05/13/23 1626 05/15/23 1633   05/13/23 1442  ceFAZolin (ANCEF) IVPB 2g/100 mL premix        2 g 200 mL/hr over 30 Minutes Intravenous 30 min pre-op 05/13/23 1442 05/15/23 0933   05/12/23 0000  vancomycin (VANCOREADY) IVPB 1250 mg/250 mL  Status:  Discontinued        1,250 mg 166.7 mL/hr over 90 Minutes Intravenous Every 12 hours 05/11/23 1345 05/14/23 0103   05/08/23 1000  vancomycin (VANCOCIN) IVPB 1000 mg/200 mL premix  Status:  Discontinued        1,000 mg 200 mL/hr over 60 Minutes Intravenous Every 12 hours 05/07/23 2251 05/11/23 1345   05/08/23 0400  ceFEPIme (MAXIPIME) 2 g in sodium chloride 0.9 % 100 mL IVPB  Status:  Discontinued        2 g 200 mL/hr over 30 Minutes Intravenous Every 8 hours 05/07/23 2252 05/13/23 0804   05/07/23 2230  ceFEPIme (MAXIPIME) 2 g in sodium chloride 0.9 % 100 mL IVPB  Status:  Discontinued        2 g 200 mL/hr over 30 Minutes Intravenous  Once 05/07/23 2222 05/07/23 2251   05/07/23 2230  metroNIDAZOLE (FLAGYL) IVPB 500 mg  Status:  Discontinued        500 mg 100 mL/hr over 60 Minutes Intravenous Every 12 hours  05/07/23 2222 05/08/23 0953   05/07/23 2230  vancomycin (VANCOCIN) IVPB 1000 mg/200 mL premix  Status:  Discontinued        1,000 mg 200 mL/hr over 60 Minutes Intravenous  Once 05/07/23 2222 05/07/23 2224   05/07/23 2230  Vancomycin (VANCOCIN) 1,500 mg in sodium chloride 0.9 % 500 mL IVPB        1,500 mg 250 mL/hr over 120 Minutes Intravenous  Once 05/07/23 2225 05/08/23 0312   05/07/23 2130  vancomycin (VANCOCIN) IVPB 1000 mg/200 mL premix        1,000 mg 200 mL/hr over 60 Minutes Intravenous  Once 05/07/23 2128 05/08/23 0114   05/07/23 2130  piperacillin-tazobactam (ZOSYN) IVPB 3.375 g        3.375 g 100 mL/hr over 30 Minutes Intravenous  Once 05/07/23 2128 05/07/23 2216           Data Reviewed:  I have personally reviewed the following...  CBC: Recent Labs  Lab 05/11/23 0246 05/14/23 0449 05/15/23 0403 05/16/23 0541 05/16/23 0901  WBC 10.5 9.2 8.2 9.5  --   HGB 9.5* 9.3* 9.4* 9.1* 8.4*  HCT 28.3* 27.8* 28.5* 27.2* 25.1*  MCV 86.5 84.8 87.4 85.5  --   PLT 362 383 339 368  --    Basic Metabolic Panel: Recent Labs  Lab 05/11/23 0246 05/14/23 0449 05/15/23 0403 05/16/23 0541  NA 132* 137 132* 133*  K 3.6 3.1* 3.7 3.9  CL 101 103 99 97*  CO2 23 26 25 25   GLUCOSE 172* 113* 218* 130*  BUN 19 12 14 8   CREATININE 0.93 0.87 0.79 0.73  CALCIUM 7.6* 8.0* 7.8* 8.3*   GFR: Estimated Creatinine Clearance: 126 mL/min (by C-G formula based on SCr of 0.73 mg/dL). Liver Function Tests: No results for input(s): "AST", "ALT", "ALKPHOS", "BILITOT", "PROT", "ALBUMIN" in the last 168 hours.  No results for input(s): "LIPASE", "AMYLASE" in the last 168 hours. No results for input(s): "AMMONIA" in the last  168 hours. Coagulation Profile: No results for input(s): "INR", "PROTIME" in the last 168 hours. Cardiac Enzymes: No results for input(s): "CKTOTAL", "CKMB", "CKMBINDEX", "TROPONINI" in the last 168 hours. BNP (last 3 results) No results for input(s): "PROBNP" in the last  8760 hours. HbA1C: No results for input(s): "HGBA1C" in the last 72 hours. CBG: Recent Labs  Lab 05/16/23 1701 05/16/23 1951 05/17/23 0010 05/17/23 0411 05/17/23 0736  GLUCAP 258* 233* 190* 122* 142*   Lipid Profile: No results for input(s): "CHOL", "HDL", "LDLCALC", "TRIG", "CHOLHDL", "LDLDIRECT" in the last 72 hours. Thyroid Function Tests: No results for input(s): "TSH", "T4TOTAL", "FREET4", "T3FREE", "THYROIDAB" in the last 72 hours. Anemia Panel: No results for input(s): "VITAMINB12", "FOLATE", "FERRITIN", "TIBC", "IRON", "RETICCTPCT" in the last 72 hours. Most Recent Urinalysis On File:     Component Value Date/Time   COLORURINE YELLOW (A) 06/12/2015 1252   APPEARANCEUR CLEAR (A) 06/12/2015 1252   LABSPEC 1.042 (H) 06/12/2015 1252   PHURINE 6.0 06/12/2015 1252   GLUCOSEU >500 (A) 06/12/2015 1252   HGBUR NEGATIVE 06/12/2015 1252   BILIRUBINUR NEGATIVE 06/12/2015 1252   KETONESUR TRACE (A) 06/12/2015 1252   PROTEINUR 100 (A) 06/12/2015 1252   NITRITE NEGATIVE 06/12/2015 1252   LEUKOCYTESUR NEGATIVE 06/12/2015 1252   Sepsis Labs: @LABRCNTIP (procalcitonin:4,lacticidven:4) Microbiology: Recent Results (from the past 240 hours)  Blood culture (single)     Status: None   Collection Time: 05/07/23  7:21 PM   Specimen: BLOOD  Result Value Ref Range Status   Specimen Description BLOOD LEFT FOREARM  Final   Special Requests   Final    BOTTLES DRAWN AEROBIC AND ANAEROBIC Blood Culture adequate volume   Culture   Final    NO GROWTH 5 DAYS Performed at [facility], [address] [place], [place] [address]    Report Status 05/12/2023 FINAL  Final  Surgical pcr screen     Status: Abnormal   Collection Time: 05/08/23  8:51 AM   Specimen: Nasal Mucosa; Nasal Swab  Result Value Ref Range Status   MRSA, PCR NEGATIVE NEGATIVE Final   Staphylococcus aureus POSITIVE (A) NEGATIVE Final    Comment: (NOTE) The Xpert SA Assay (FDA approved for NASAL specimens in  patients 57 years of age and older), is one component of a comprehensive surveillance program. It is not intended to diagnose infection nor to guide or monitor treatment. Performed at [facility], [address], [place], [place] [address]   Aerobic/Anaerobic Culture w Gram Stain (surgical/deep wound)     Status: None   Collection Time: 05/08/23 11:54 AM   Specimen: Path Tissue  Result Value Ref Range Status   Specimen Description   Final    TISSUE Performed at [facility], [address], [place], [place] [address]    Special Requests SWAB OF RIGHT FOOT DEEP ABSCESS  Final   Gram Stain NO WBC SEEN FEW GRAM POSITIVE COCCI IN PAIRS   Final   Culture   Final    FEW ENTEROCOCCUS FAECALIS FEW METHICILLIN RESISTANT STAPHYLOCOCCUS AUREUS NO ANAEROBES ISOLATED Performed at [facility], [address] [place], [place] [address]    Report Status 05/13/2023 FINAL  Final   Organism ID, Bacteria ENTEROCOCCUS FAECALIS  Final   Organism ID, Bacteria METHICILLIN RESISTANT STAPHYLOCOCCUS AUREUS  Final      Susceptibility   Enterococcus faecalis - MIC*    AMPICILLIN <=2 SENSITIVE Sensitive     VANCOMYCIN 2 SENSITIVE Sensitive     GENTAMICIN SYNERGY  SENSITIVE Sensitive     * FEW ENTEROCOCCUS FAECALIS   Methicillin resistant staphylococcus aureus - MIC*    CIPROFLOXACIN 2 INTERMEDIATE Intermediate     ERYTHROMYCIN >=8 RESISTANT Resistant     GENTAMICIN <=0.5 SENSITIVE Sensitive     OXACILLIN RESISTANT Resistant     TETRACYCLINE <=1 SENSITIVE Sensitive     VANCOMYCIN <=0.5 SENSITIVE Sensitive     TRIMETH/SULFA <=10 SENSITIVE Sensitive     CLINDAMYCIN 2 INTERMEDIATE Intermediate     RIFAMPIN <=0.5 SENSITIVE Sensitive     Inducible Clindamycin NEGATIVE Sensitive     LINEZOLID 2 SENSITIVE Sensitive     * FEW METHICILLIN RESISTANT STAPHYLOCOCCUS AUREUS  Aerobic/Anaerobic Culture w Gram Stain (surgical/deep wound)     Status: None (Preliminary result)   Collection  Time: 05/12/23  4:29 PM   Specimen: Path Tissue  Result Value Ref Range Status   Specimen Description   Final    TISSUE Performed at [facility], [address], [place], [place] [address]    Special Requests   Final    NONE Performed at [facility], [address] [place], [place] [address]    Gram Stain   Final    RARE WBC PRESENT,BOTH PMN AND MONONUCLEAR NO ORGANISMS SEEN Performed at [facility], [address] [place], [place] [address]    Culture   Final    RARE ENTEROCOCCUS FAECALIS CRITICAL RESULT CALLED TO, READ BACK BY AND VERIFIED WITH: RN kelly h. [phone] fcp NO ANAEROBES ISOLATED; CULTURE IN PROGRESS FOR 5 DAYS    Report Status PENDING  Incomplete   Organism ID, Bacteria ENTEROCOCCUS FAECALIS  Final      Susceptibility   Enterococcus faecalis - MIC*    AMPICILLIN <=2 SENSITIVE Sensitive     VANCOMYCIN 2 SENSITIVE Sensitive     GENTAMICIN SYNERGY SENSITIVE Sensitive     * RARE ENTEROCOCCUS FAECALIS      Radiology Studies last 3 days: DG Tibia/Fibula Right Result Date: 05/13/2023 CLINICAL DATA:  Postop check status post transmetatarsal amputation and debridement of the mid calf. EXAM: RIGHT FOOT COMPLETE - 3+ VIEW; RIGHT TIBIA AND FIBULA - 2 VIEW COMPARISON:  Foot radiographs 05/11/2023 and 05/07/2023. MRI 05/07/2023. FINDINGS: Right tibia and fibula: The mineralization and alignment are normal. There is no evidence of acute fracture or dislocation. The joint spaces appear preserved at the knee. There are vascular stents posterior to the distal right femur and within the popliteal fossa. No focal soft tissue abnormalities are identified allowing for artifact associated with overlying sheets or clothing. Right foot: Status post transmetatarsal amputation through the bases of all the metatarsals, unchanged from the prior study of 2 days earlier. No progressive bone destruction. A small amount of gas is present within the distal soft  tissues, nonspecific. Skin staples are in place. IMPRESSION: 1. Stable postoperative appearance of the right foot status post transmetatarsal amputation. No evidence of progressive bone destruction. 2. No acute osseous findings in the right lower leg. Electronically Signed   By: Carey Bullocks M.D.   On: 05/13/2023 13:52        Sunnie Nielsen, DO [facility] 05/17/2023, 12:04 PM    Dictation software may have been used to generate the above note. Typos may occur and escape review in typed/dictated notes. Please contact Dr Lyn Hollingshead directly for clarity if needed.  Staff may message me via secure chat in Epic  but this may not receive an immediate response,  please page me for  urgent matters!  If 7PM-7AM, please contact night coverage [email or web address]

## 2023-05-17 NOTE — Evaluation (Signed)
Physical Therapy Re-Evaluation Patient Details Name: Gerald Ellis MRN: [id number] DOB: [birth year] Today's Date: 05/17/2023  History of Present Illness  57 y.o. male with medical history significant for HTN, HLD, CAD s/p stent, DM with neuropathy, chronic back pain on opiates, anxiety, chronic ulcer first right MTP, followed by podiatry (last seen 02/18/2023), a with normal resting ABI at the time and treated with topical dressings who presents to the ED with a 1 week history of blistering in the area of the wound and then over the past day he noted that his second and third toes of the right foot were turning black. 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 NWB to RLE, s/p I&D 12/23 to RLE.  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  S/p 05/16/23 BKA stump washout/revision (d/t hematoma).  Clinical Impression  PT re-evaluation performed s/p R BKA.  Prior to recent medical concerns, pt was ambulatory; lives alone in home with steps to enter (pt reports plan to put in a ramp) but could also possibly stay with his sister (1 level home with ramp to enter).  6-7/10 R BKA pain reported during session (pt received pain medication beginning of session).  Currently pt is modified independent with bed mobility and CGA with transfer using RW.  Unable to progress to ambulation d/t pt c/o lightheadedness and dizziness in standing that did not improve (BP 122/66 sitting on EOB) so pt assisted back to bed and symptoms resolved.  Pt would currently benefit from skilled PT to address noted impairments and functional limitations (see below for any additional details).  Upon hospital discharge, pt would benefit from ongoing therapy.  PT POC reviewed and updated.    If plan is discharge home, recommend the following: A little help with walking and/or transfers;A little help with bathing/dressing/bathroom;Assistance with cooking/housework;Assist for transportation;Help with stairs or ramp for entrance   Can travel by private vehicle         Equipment Recommendations Rolling walker (2 wheels);BSC/3in1;Wheelchair (measurements PT);Wheelchair cushion (measurements PT)  Recommendations for Other Services       Functional Status Assessment Patient has had a recent decline in their functional status and demonstrates the ability to make significant improvements in function in a reasonable and predictable amount of time.     Precautions / Restrictions Precautions Precautions: Fall Restrictions Weight Bearing Restrictions Per Provider Order: Yes RLE Weight Bearing Per Provider Order: Non weight bearing      Mobility  Bed Mobility Overal bed mobility: Modified Independent             General bed mobility comments: Semi-supine to/from sitting with mild increased effort to perform on own    Transfers Overall transfer level: Needs assistance Equipment used: Rolling walker (2 wheels) Transfers: Sit to/from Stand Sit to Stand: Contact guard assist           General transfer comment: NWB R LE; increased effort to stand up to RW; vc's for UE placement    Ambulation/Gait               General Gait Details: Deferred d/t c/o dizziness and lightheadedness that did not resolve in standing  Stairs            Wheelchair Mobility     Tilt Bed    Modified Rankin (Stroke Patients Only)       Balance Overall balance assessment: Needs assistance Sitting-balance support: No upper extremity supported, Feet unsupported Sitting balance-Leahy Scale: Normal Sitting balance - Comments: steady reaching outside BOS   Standing  balance support: Bilateral upper extremity supported Standing balance-Leahy Scale: Fair Standing balance comment: steady static standing with B UE support on RW                             Pertinent Vitals/Pain Pain Assessment Pain Assessment: 0-10 Pain Score: 7  Pain Location: R BKA/residual limb Pain Descriptors / Indicators: Aching, Discomfort Pain  Intervention(s): Limited activity within patient's tolerance, Monitored during session, Repositioned, Patient requesting pain meds-RN notified, RN gave pain meds during session    Home Living Family/patient expects to be discharged to:: Private residence Living Arrangements: Alone Available Help at Discharge: Family;Available PRN/intermittently Type of Home: House Home Access: Stairs to enter (pt reports plan to put in ramp soon) Entrance Stairs-Rails: Right;Left Entrance Stairs-Number of Steps: 6   Home Layout: Two level;Able to live on main level with bedroom/bathroom   Additional Comments: Pt reports he could stay with his sister (1 level home with ramp to enter; walk-in shower with built in shower seat and grab bar; elevated toilet with grab bar)    Prior Function Prior Level of Function : Independent/Modified Independent;Driving             Mobility Comments: Independent with ambulation and driving ADLs Comments: Independent with ADL's and IADL's     Extremity/Trunk Assessment   Upper Extremity Assessment Upper Extremity Assessment: Overall WFL for tasks assessed    Lower Extremity Assessment Lower Extremity Assessment: Generalized weakness;RLE deficits/detail RLE Deficits / Details: R BKA; R knee extension approximately 10 degrees short of neutral extension; R knee flexion approximately 20 degrees flexion (limited d/t pain) RLE: Unable to fully assess due to pain    Cervical / Trunk Assessment Cervical / Trunk Assessment: Normal  Communication   Communication Communication: No apparent difficulties Cueing Techniques: Verbal cues  Cognition Arousal: Alert Behavior During Therapy: WFL for tasks assessed/performed Overall Cognitive Status: Within Functional Limits for tasks assessed                                          General Comments General comments (skin integrity, edema, etc.): R BKA dressings in place.  Nursing cleared pt for  participation in physical therapy.  Pt agreeable to PT session.    Exercises     Assessment/Plan    PT Assessment Patient needs continued PT services  PT Problem List Decreased strength;Decreased range of motion;Decreased activity tolerance;Decreased balance;Decreased mobility;Decreased knowledge of use of DME;Decreased knowledge of precautions;Impaired sensation;Decreased skin integrity;Pain       PT Treatment Interventions Gait training;Functional mobility training;Therapeutic activities;Neuromuscular re-education;Therapeutic exercise;Balance training;Patient/family education;Stair training;Wheelchair mobility training    PT Goals (Current goals can be found in the Care Plan section)  Acute Rehab PT Goals Patient Stated Goal: improve pain and walking PT Goal Formulation: With patient Time For Goal Achievement: 05/31/23 Potential to Achieve Goals: Good    Frequency Min 1X/week     Co-evaluation               AM-PAC PT "6 Clicks" Mobility  Outcome Measure Help needed turning from your back to your side while in a flat bed without using bedrails?: None Help needed moving from lying on your back to sitting on the side of a flat bed without using bedrails?: None Help needed moving to and from a bed to a chair (including a wheelchair)?: A  Little Help needed standing up from a chair using your arms (e.g., wheelchair or bedside chair)?: A Little Help needed to walk in hospital room?: A Little Help needed climbing 3-5 steps with a railing? : A Lot 6 Click Score: 19    End of Session Equipment Utilized During Treatment: Gait belt Activity Tolerance: Other (comment);Patient limited by pain (Limited d/t dizziness/lightheadedness in standing) Patient left: in bed;with call bell/phone within reach;with bed alarm set Nurse Communication: Mobility status;Precautions;Weight bearing status;Patient requests pain meds PT Visit Diagnosis: Muscle weakness (generalized) (M62.81);Difficulty  in walking, not elsewhere classified (R26.2);Other abnormalities of gait and mobility (R26.89)    Time: 3875-6433 PT Time Calculation (min) (ACUTE ONLY): 23 min   Charges:   PT Evaluation $PT Re-evaluation: 1 Re-eval   PT General Charges $$ ACUTE PT VISIT: 1 Visit        Hendricks Limes, PT 05/17/23, 5:41 PM

## 2023-05-17 NOTE — Progress Notes (Signed)
1 Day Post-Op   Subjective/Chief Complaint: Denies pain. Without complaint. Eating breakfast   Objective: Vital signs in last 24 hours: Temp:  [97.7 F (36.5 C)-99.4 F (37.4 C)] 98.8 F (37.1 C) (12/28 0735) Pulse Rate:  [81-97] 90 (12/28 0735) Resp:  [12-30] 16 (12/28 0735) BP: (118-183)/(63-94) 137/69 (12/28 0735) SpO2:  [91 %-98 %] 98 % (12/28 0735) Last BM Date : 05/14/23  Intake/Output from previous day: 12/27 0701 - 12/28 0700 In: 700 [P.O.:200; I.V.:500] Out: 975 [Urine:775; Blood:100] Intake/Output this shift: Total I/O In: 10 [I.V.:10] Out: 1000 [Urine:1000]  General appearance: alert and no distress Cardio: regular rate and rhythm Extremities: RIGHT BKA dressing- C/D/I, thigh soft  Lab Results:  Recent Labs    05/15/23 0403 05/16/23 0541 05/16/23 0901  WBC 8.2 9.5  --   HGB 9.4* 9.1* 8.4*  HCT 28.5* 27.2* 25.1*  PLT 339 368  --    BMET Recent Labs    05/15/23 0403 05/16/23 0541  NA 132* 133*  K 3.7 3.9  CL 99 97*  CO2 25 25  GLUCOSE 218* 130*  BUN 14 8  CREATININE 0.79 0.73  CALCIUM 7.8* 8.3*   PT/INR No results for input(s): "LABPROT", "INR" in the last 72 hours. ABG No results for input(s): "PHART", "HCO3" in the last 72 hours.  Invalid input(s): "PCO2", "PO2"  Studies/Results: No results found.  Anti-infectives: Anti-infectives (From admission, onward)    Start     Dose/Rate Route Frequency Ordered Stop   05/16/23 1227  ceFAZolin (ANCEF) IVPB 2g/100 mL premix        2 g 200 mL/hr over 30 Minutes Intravenous 30 min pre-op 05/16/23 1227 05/16/23 1329   05/16/23 0000  vancomycin (VANCOREADY) IVPB 1250 mg/250 mL        1,250 mg 166.7 mL/hr over 90 Minutes Intravenous Every 12 hours 05/15/23 1633 05/17/23 2359   05/14/23 0000  Vancomycin (VANCOCIN) 1,250 mg in sodium chloride 0.9 % 250 mL IVPB  Status:  Discontinued        1,250 mg 166.7 mL/hr over 90 Minutes Intravenous Every 12 hours 05/13/23 1626 05/15/23 1633   05/13/23 1442   ceFAZolin (ANCEF) IVPB 2g/100 mL premix        2 g 200 mL/hr over 30 Minutes Intravenous 30 min pre-op 05/13/23 1442 05/15/23 0933   05/12/23 0000  vancomycin (VANCOREADY) IVPB 1250 mg/250 mL  Status:  Discontinued        1,250 mg 166.7 mL/hr over 90 Minutes Intravenous Every 12 hours 05/11/23 1345 05/14/23 0103   05/08/23 1000  vancomycin (VANCOCIN) IVPB 1000 mg/200 mL premix  Status:  Discontinued        1,000 mg 200 mL/hr over 60 Minutes Intravenous Every 12 hours 05/07/23 2251 05/11/23 1345   05/08/23 0400  ceFEPIme (MAXIPIME) 2 g in sodium chloride 0.9 % 100 mL IVPB  Status:  Discontinued        2 g 200 mL/hr over 30 Minutes Intravenous Every 8 hours 05/07/23 2252 05/13/23 0804   05/07/23 2230  ceFEPIme (MAXIPIME) 2 g in sodium chloride 0.9 % 100 mL IVPB  Status:  Discontinued        2 g 200 mL/hr over 30 Minutes Intravenous  Once 05/07/23 2222 05/07/23 2251   05/07/23 2230  metroNIDAZOLE (FLAGYL) IVPB 500 mg  Status:  Discontinued        500 mg 100 mL/hr over 60 Minutes Intravenous Every 12 hours 05/07/23 2222 05/08/23 0953   05/07/23 2230  vancomycin (  VANCOCIN) IVPB 1000 mg/200 mL premix  Status:  Discontinued        1,000 mg 200 mL/hr over 60 Minutes Intravenous  Once 05/07/23 2222 05/07/23 2224   05/07/23 2230  Vancomycin (VANCOCIN) 1,500 mg in sodium chloride 0.9 % 500 mL IVPB        1,500 mg 250 mL/hr over 120 Minutes Intravenous  Once 05/07/23 2225 05/08/23 0312   05/07/23 2130  vancomycin (VANCOCIN) IVPB 1000 mg/200 mL premix        1,000 mg 200 mL/hr over 60 Minutes Intravenous  Once 05/07/23 2128 05/08/23 0114   05/07/23 2130  piperacillin-tazobactam (ZOSYN) IVPB 3.375 g        3.375 g 100 mL/hr over 30 Minutes Intravenous  Once 05/07/23 2128 05/07/23 2216       Assessment/Plan: s/p Procedure(s): REVISION OF BELOW KNEE AMPUTATION (Right) POD #1 s/p BKA stump washout  HgB stable no evidence of continued bleeding OK for OOB with PT Will change dressing on  Monday  LOS: 10 days    Eli Hose A 05/17/2023

## 2023-05-18 DIAGNOSIS — L089 Local infection of the skin and subcutaneous tissue, unspecified: Secondary | ICD-10-CM | POA: Diagnosis not present

## 2023-05-18 DIAGNOSIS — E11628 Type 2 diabetes mellitus with other skin complications: Secondary | ICD-10-CM | POA: Diagnosis not present

## 2023-05-18 LAB — BASIC METABOLIC PANEL
Anion gap: 9 (ref 5–15)
BUN: 13 mg/dL (ref 6–20)
CO2: 26 mmol/L (ref 22–32)
Calcium: 8 mg/dL — ABNORMAL LOW (ref 8.9–10.3)
Chloride: 99 mmol/L (ref 98–111)
Creatinine, Ser: 0.78 mg/dL (ref 0.61–1.24)
GFR, Estimated: >60 mL/min (ref >60)
Glucose, Bld: 125 mg/dL — ABNORMAL HIGH (ref 70–99)
Potassium: 3.9 mmol/L (ref 3.5–5.1)
Sodium: 134 mmol/L — ABNORMAL LOW (ref 135–145)

## 2023-05-18 LAB — CBC
HCT: 21.2 % — ABNORMAL LOW (ref 39.0–52.0)
Hemoglobin: 7 g/dL — ABNORMAL LOW (ref 13.0–17.0)
MCH: 28.3 pg (ref 26.0–34.0)
MCHC: 33 g/dL (ref 30.0–36.0)
MCV: 85.8 fL (ref 80.0–100.0)
Platelets: 366 10*3/uL (ref 150–400)
RBC: 2.47 MIL/uL — ABNORMAL LOW (ref 4.22–5.81)
RDW: 13 % (ref 11.5–15.5)
WBC: 12 10*3/uL — ABNORMAL HIGH (ref 4.0–10.5)
nRBC: 0 % (ref 0.0–0.2)

## 2023-05-18 LAB — GLUCOSE, CAPILLARY
Glucose-Capillary: 131 mg/dL — ABNORMAL HIGH (ref 70–99)
Glucose-Capillary: 138 mg/dL — ABNORMAL HIGH (ref 70–99)
Glucose-Capillary: 143 mg/dL — ABNORMAL HIGH (ref 70–99)
Glucose-Capillary: 178 mg/dL — ABNORMAL HIGH (ref 70–99)
Glucose-Capillary: 249 mg/dL — ABNORMAL HIGH (ref 70–99)
Glucose-Capillary: 263 mg/dL — ABNORMAL HIGH (ref 70–99)

## 2023-05-18 NOTE — Plan of Care (Signed)
  Problem: Education: Goal: Ability to describe self-care measures that may prevent or decrease complications (Diabetes Survival Skills Education) will improve Outcome: Progressing Goal: Individualized Educational Video(s) Outcome: Progressing   Problem: Coping: Goal: Ability to adjust to condition or change in health will improve Outcome: Progressing   Problem: Fluid Volume: Goal: Ability to maintain a balanced intake and output will improve Outcome: Progressing   Problem: Health Behavior/Discharge Planning: Goal: Ability to identify and utilize available resources and services will improve Outcome: Progressing Goal: Ability to manage health-related needs will improve Outcome: Progressing   Problem: Metabolic: Goal: Ability to maintain appropriate glucose levels will improve Outcome: Progressing   Problem: Nutritional: Goal: Maintenance of adequate nutrition will improve Outcome: Progressing Goal: Progress toward achieving an optimal weight will improve Outcome: Progressing   Problem: Skin Integrity: Goal: Risk for impaired skin integrity will decrease Outcome: Progressing   Problem: Tissue Perfusion: Goal: Adequacy of tissue perfusion will improve Outcome: Progressing   Problem: Fluid Volume: Goal: Hemodynamic stability will improve Outcome: Progressing   Problem: Clinical Measurements: Goal: Diagnostic test results will improve Outcome: Progressing Goal: Signs and symptoms of infection will decrease Outcome: Progressing   Problem: Respiratory: Goal: Ability to maintain adequate ventilation will improve Outcome: Progressing   Problem: Education: Goal: Knowledge of General Education information will improve Description: Including pain rating scale, medication(s)/side effects and non-pharmacologic comfort measures Outcome: Progressing   Problem: Health Behavior/Discharge Planning: Goal: Ability to manage health-related needs will improve Outcome:  Progressing   Problem: Clinical Measurements: Goal: Ability to maintain clinical measurements within normal limits will improve Outcome: Progressing Goal: Will remain free from infection Outcome: Progressing Goal: Diagnostic test results will improve Outcome: Progressing Goal: Respiratory complications will improve Outcome: Progressing Goal: Cardiovascular complication will be avoided Outcome: Progressing   Problem: Activity: Goal: Risk for activity intolerance will decrease Outcome: Progressing   Problem: Nutrition: Goal: Adequate nutrition will be maintained Outcome: Progressing   Problem: Coping: Goal: Level of anxiety will decrease Outcome: Progressing   Problem: Elimination: Goal: Will not experience complications related to bowel motility Outcome: Progressing Goal: Will not experience complications related to urinary retention Outcome: Progressing   Problem: Pain Management: Goal: General experience of comfort will improve Outcome: Progressing   Problem: Safety: Goal: Ability to remain free from injury will improve Outcome: Progressing   Problem: Skin Integrity: Goal: Risk for impaired skin integrity will decrease Outcome: Progressing   Problem: Education: Goal: Understanding of CV disease, CV risk reduction, and recovery process will improve Outcome: Progressing Goal: Individualized Educational Video(s) Outcome: Progressing   Problem: Activity: Goal: Ability to return to baseline activity level will improve Outcome: Progressing   Problem: Cardiovascular: Goal: Ability to achieve and maintain adequate cardiovascular perfusion will improve Outcome: Progressing Goal: Vascular access site(s) Level 0-1 will be maintained Outcome: Progressing   Problem: Health Behavior/Discharge Planning: Goal: Ability to safely manage health-related needs after discharge will improve Outcome: Progressing

## 2023-05-18 NOTE — NC FL2 (Signed)
[place] MEDICAID FL2 LEVEL OF CARE FORM     IDENTIFICATION  Patient Name: Gerald Ellis Birthdate: [birth year] Sex: male Admission Date (Current Location): 05/07/2023  [facility] and [place] Number:  Chiropodist and Address:  [facility], [address], [place], [place] [address]      Provider Number: [id number]  Attending Physician Name and Address:  Sunnie Nielsen, DO  Relative Name and Phone Number:  Maralyn Sago (Sister)  [phone] [facility])    Current Level of Care: Hospital Recommended Level of Care: Skilled Nursing Facility Prior Approval Number:    Date Approved/Denied:   PASRR Number: pending  Discharge Plan:      Current Diagnoses: Patient Active Problem List   Diagnosis Date Noted   Diabetic foot infection (HCC) 05/07/2023   Sepsis (HCC) 05/07/2023   Chronic back pain 05/07/2023   Anxiety 05/07/2023   Obesity, Class III, BMI 40-49.9 (morbid obesity) (HCC) 05/07/2023   Uncontrolled type 2 diabetes mellitus with hyperglycemia, with long-term current use of insulin (HCC) 05/07/2023   Genetic testing 04/09/2022   Chronic venous insufficiency 06/29/2020   Lymphedema 06/29/2020   PAD (peripheral artery disease) (HCC) 05/30/2020   Ankle ulcer, right, with fat layer exposed (HCC) 05/30/2020   Allergy 05/29/2020   Asthma without status asthmaticus 05/29/2020   Diabetes mellitus type 2, uncomplicated (HCC) 05/29/2020   S/P coronary artery stent placement 04/30/2019   CAD S/P percutaneous coronary angioplasty 04/20/2019   Abnormal ECG 03/18/2019   Chest pain with high risk for cardiac etiology 03/18/2019   SOB (shortness of breath) on exertion 03/18/2019   Hypercholesterolemia 05/07/2015   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   Hypogonadism in male 11/22/2014   BPH with obstruction/lower urinary tract symptoms 11/22/2014   DDD (degenerative disc disease), lumbar 10/13/2014   Status post lumbar  laminectomy 10/13/2014   DJD of shoulder 10/13/2014   Bilateral occipital neuralgia 10/13/2014   Lumbar radiculopathy 10/13/2014   DDD (degenerative disc disease), cervical 10/13/2014   Depression 08/25/2013   HTN (hypertension) 08/25/2013   History of hemorrhoids 08/25/2013   Low back pain 08/25/2013    Orientation RESPIRATION BLADDER Height & Weight     Self, Time, Situation, Place  Normal Continent Weight: 235 lb 0.2 oz (106.6 kg) Height:  5\' 10"  (177.8 cm)  BEHAVIORAL SYMPTOMS/MOOD NEUROLOGICAL BOWEL NUTRITION STATUS      Continent Diet (carb modified)  AMBULATORY STATUS COMMUNICATION OF NEEDS Skin   Extensive Assist Verbally Surgical wounds (R leg)                       Personal Care Assistance Level of Assistance  Bathing, Feeding, Dressing Bathing Assistance: Maximum assistance Feeding assistance: Independent Dressing Assistance: Maximum assistance     Functional Limitations Info             SPECIAL CARE FACTORS FREQUENCY  PT (By licensed PT), OT (By licensed OT)     PT Frequency: 5 times per week OT Frequency: 5 times per week            Contractures      Additional Factors Info  Code Status, Allergies Code Status Info: full Allergies Info: Ibuprofen, Nsaids           Current Medications (05/18/2023):  This is the current hospital active medication list Current Facility-Administered Medications  Medication Dose Route Frequency Provider Last Rate Last Admin   acetaminophen (TYLENOL) tablet 650 mg  650 mg Oral Q6H PRN Dew,  Marlow Baars, MD       Or   acetaminophen (TYLENOL) suppository 650 mg  650 mg Rectal Q6H PRN Annice Needy, MD       amLODipine (NORVASC) tablet 5 mg  5 mg Oral Daily Annice Needy, MD   5 mg at 05/18/23 0930   aspirin chewable tablet 81 mg  81 mg Oral Daily Annice Needy, MD   81 mg at 05/18/23 0930   atorvastatin (LIPITOR) tablet 80 mg  80 mg Oral q1800 Annice Needy, MD   80 mg at 05/17/23 1750   busPIRone (BUSPAR) tablet 7.5  mg  7.5 mg Oral BID Annice Needy, MD   7.5 mg at 05/18/23 0932   feeding supplement (ENSURE ENLIVE / ENSURE PLUS) liquid 237 mL  237 mL Oral BID BM Annice Needy, MD   237 mL at 05/18/23 1534   HYDROcodone-acetaminophen (NORCO/VICODIN) 5-325 MG per tablet 1-2 tablet  1-2 tablet Oral Q4H PRN Sunnie Nielsen, DO   2 tablet at 05/18/23 0610   insulin aspart (novoLOG) injection 0-20 Units  0-20 Units Subcutaneous Q4H Annice Needy, MD   4 Units at 05/18/23 1253   insulin aspart (novoLOG) injection 3 Units  3 Units Subcutaneous TID WC Annice Needy, MD   3 Units at 05/18/23 1252   insulin glargine-yfgn (SEMGLEE) injection 38 Units  38 Units Subcutaneous QHS Annice Needy, MD   38 Units at 05/17/23 2050   ipratropium-albuterol (DUONEB) 0.5-2.5 (3) MG/3ML nebulizer solution 3 mL  3 mL Inhalation Q6H PRN Annice Needy, MD       isosorbide mononitrate (IMDUR) 24 hr tablet 30 mg  30 mg Oral Daily Annice Needy, MD   30 mg at 05/18/23 0930   magnesium hydroxide (MILK OF MAGNESIA) suspension 30 mL  30 mL Oral QHS PRN Annice Needy, MD       metoprolol succinate (TOPROL-XL) 24 hr tablet 50 mg  50 mg Oral Daily Annice Needy, MD   50 mg at 05/18/23 0930   omega-3 acid ethyl esters (LOVAZA) capsule 1 g  1 g Oral BID Annice Needy, MD   1 g at 05/18/23 0930   ondansetron (ZOFRAN) tablet 4 mg  4 mg Oral Q6H PRN Annice Needy, MD       Or   ondansetron [facility]) injection 4 mg  4 mg Intravenous Q6H PRN Annice Needy, MD   4 mg at 05/08/23 0828   pantoprazole (PROTONIX) EC tablet 40 mg  40 mg Oral BID Annice Needy, MD   40 mg at 05/18/23 0930   phenol (CHLORASEPTIC) mouth spray 1 spray  1 spray Mouth/Throat PRN Annice Needy, MD   1 spray at 05/09/23 0533   polyethylene glycol (MIRALAX / GLYCOLAX) packet 17 g  17 g Oral Daily Annice Needy, MD   17 g at 05/18/23 0932   prasugrel (EFFIENT) tablet 10 mg  10 mg Oral Daily Annice Needy, MD   10 mg at 05/18/23 0933   sodium chloride flush (NS) 0.9 % injection 3 mL  3 mL Intravenous  Q12H Annice Needy, MD   3 mL at 05/18/23 0937   sodium chloride flush (NS) 0.9 % injection 3 mL  3 mL Intravenous PRN Annice Needy, MD       sodium chloride flush (NS) 0.9 % injection 3-10 mL  3-10 mL Intravenous Q12H Dew, Marlow Baars, MD   10 mL  at 05/17/23 2048   sodium chloride flush (NS) 0.9 % injection 3-10 mL  3-10 mL Intravenous PRN Annice Needy, MD         Discharge Medications: Please see discharge summary for a list of discharge medications.  Relevant Imaging Results:  Relevant Lab Results:   Additional Information SS #: [id number]  Rubee Vega E Anella Nakata, LCSW

## 2023-05-18 NOTE — Plan of Care (Signed)
°  Problem: Coping: Goal: Ability to adjust to condition or change in health will improve Outcome: Progressing   Problem: Fluid Volume: Goal: Ability to maintain a balanced intake and output will improve Outcome: Progressing   Problem: Health Behavior/Discharge Planning: Goal: Ability to manage health-related needs will improve Outcome: Progressing   Problem: Nutritional: Goal: Maintenance of adequate nutrition will improve Outcome: Progressing   Problem: Skin Integrity: Goal: Risk for impaired skin integrity will decrease Outcome: Progressing   Problem: Tissue Perfusion: Goal: Adequacy of tissue perfusion will improve Outcome: Progressing   Problem: Respiratory: Goal: Ability to maintain adequate ventilation will improve Outcome: Progressing

## 2023-05-18 NOTE — Progress Notes (Signed)
PROGRESS NOTE    Gerald Ellis   FAO:[id number] DOB: June 21, 1965  DOA: 05/07/2023 Date of Service: 05/18/23 which is hospital day 11  PCP: Jerl Mina, MD    HPI: Gerald Ellis is a 57 y.o. male with medical history significant for HTN, HLD, CAD s/p stent, DM with neuropathy, chronic back pain on opiates,, anxiety, chronic ulcer first right MTP, followed by podiatry (last seen 02/18/2023), a with normal resting ABI at the time and treated with topical dressings who presents to the ED on 05/07/23 with a 1 week history of blistering in the area of the wound and then over the past day he noted that his second and third toes of the right foot were turning black.   Hospital course / significant events:  12/19: admitted to hospitalist service, cefepime + vanc, podiatry and vascular consults, underwent transmetatarsal amputation R foot 12/20: underwent angiography RLE w/ stent placement  12/21: monitoring post-revascularization 12/22: podiatry reeval, planning to OR for washout/I&D tomorrow given sub-cutaneous gas on x-ray today. Wound Cx (+)mrsa, enterococcus  12/23: underwent irrigation/debridement R foot, washout  12/24: foot not healing / not salvageable, planning BKA.  12/25: stable, await BKA tomorrow  12/26: underwent BKA 12/27: bleeding from stump overnight, back to OR for revision d/t hematoma   12/28: stable, planning for bandage change Mon 12/30 12/29: Hgb 7.0, pt would like to defer transfusion, follow CBC in AM   Consultants:  Podiatry  Vascular Surgery  Infectious disease   Procedures/Surgeries: 05/08/2023: Transmetatarsal amputation right foot - Dr Alberteen Spindle  05/09/2023: RLE angioplasty w/ stent to superficial femoral, popliteal, anterior tibial arteries - Dr. 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  ID following - continue abx vancomycin for 48h postop  Pain control including antispasmodic  Surgical interventions as above - currently s/p BKA RLE  PAD  S/p RLE angioplasty w/ stent 05/09/2023 ASA, Prasugrel, statin Vascular surgery following   Acute on chronic anemia Likely ABLA d/t multiple surgical procedures  Pt nervous about PRBC transfusion, opts for monitor CBC tomorrow and consider based on those results I advised pt if low BP, if lightheaded/dizzy, severe fatigue I would strongly recommend for PRBC transfusion   DM2, hyperglycemia  SSI + basal   Anxiety BuSpar   GERD PPI  Hypokalemia Replace as needed Monitor BMP  HTN CAD Metoprolol, Imdur, amlodipine Consider ACE/ARB ASA, Statin. Prasugrel  Debility, ambulatory dysfunction  HH PT/OT  R BKA stump hematoma - corrected w/ revision surgery 12/27 S/p revision 05/16/2023 Vascular surgery monitoring  Follow HH  Obesity Class 1 based on BMI: Body mass index is 33.72 kg/m.  Underweight - under 18  overweight - 25 to 29 obese - 30 or more Class 1 obesity: BMI of 30.0 to 34 Class 2 obesity: BMI of 35.0 to 39 Class 3 obesity: BMI of 40.0 to 49 Super Morbid Obesity: BMI 50-59 Super-super Morbid Obesity: BMI 60+ Significantly low or high BMI is associated with higher medical risk.  Weight management advised as adjunct to other disease management and risk reduction treatments    DVT prophylaxis: lovenox  IV fluids: no continuous IV fluids  Nutrition: diabetic diet  Central lines / invasive devices: none  Code Status: FULL CODE ACP documentation reviewed:  none on file in [facility]  TOC needs: SNF/rehab now that has BKA  Barriers to dispo / significant pending items: PT/OT as able,SNF placement             Subjective / Brief ROS:  Patient  reports cramping in RLE has been better than yesterday but still bothersome, pain is a bit better controlled overall No fever/chills  Denies CP/SOB.  Denies new weakness.   Family Communication: none at this time, pt declines call to a support person at this time       Objective Findings:  Vitals:   05/17/23 1544 05/17/23 2004 05/18/23 0018 05/18/23 0730  BP: 124/64 133/73 126/70 120/61  Pulse: 97 90 90 91  Resp: 16 20 19 16   Temp: 99.2 F (37.3 C) 98.8 F (37.1 C) 99.3 F (37.4 C) 98.4 F (36.9 C)  TempSrc: Oral   Oral  SpO2: 95% 95% 91% 94%  Weight:      Height:        Intake/Output Summary (Last 24 hours) at 05/18/2023 1314 Last data filed at 05/18/2023 0500 Gross per 24 hour  Intake 10 ml  Output 1000 ml  Net -990 ml   Filed Weights   05/07/23 1918 05/12/23 1519 05/15/23 0806  Weight: 106.6 kg 106.6 kg 106.6 kg    Examination:  Physical Exam Constitutional:      General: He is not in acute distress.    Appearance: He is not ill-appearing.  Cardiovascular:     Rate and Rhythm: Normal rate and regular rhythm.  Pulmonary:     Effort: Pulmonary effort is normal.     Breath sounds: Normal breath sounds.  Musculoskeletal:     Comments: RLE stump is wrapped, no bleeding apparent  Neurological:     General: No focal deficit present.     Mental Status: He is alert and oriented to person, place, and time.  Psychiatric:        Mood and Affect: Mood normal.        Behavior: Behavior normal.          Scheduled Medications:   amLODipine  5 mg Oral Daily   aspirin  81 mg Oral Daily   atorvastatin  80 mg Oral q1800   busPIRone  7.5 mg Oral BID   feeding supplement  237 mL Oral BID BM   insulin aspart  0-20 Units Subcutaneous Q4H   insulin aspart  3 Units Subcutaneous TID WC   insulin glargine-yfgn  38 Units Subcutaneous QHS   isosorbide mononitrate  30 mg Oral Daily   metoprolol succinate  50 mg Oral Daily   omega-3 acid ethyl esters  1 g Oral BID    pantoprazole  40 mg Oral BID   polyethylene glycol  17 g Oral Daily   prasugrel  10 mg Oral Daily   sodium chloride flush  3 mL Intravenous Q12H   sodium chloride flush  3-10 mL Intravenous Q12H    Continuous Infusions:    PRN Medications:  acetaminophen **OR** acetaminophen, HYDROcodone-acetaminophen, ipratropium-albuterol, magnesium hydroxide, morphine injection, ondansetron **OR** ondansetron (ZOFRAN) IV, phenol, sodium chloride flush, sodium chloride flush  Antimicrobials from admission:  Anti-infectives (From admission, onward)    Start     Dose/Rate Route Frequency Ordered Stop   05/16/23 1227  ceFAZolin (ANCEF) IVPB 2g/100 mL premix        2 g 200 mL/hr over 30 Minutes Intravenous 30 min pre-op 05/16/23 1227 05/16/23  1329   05/16/23 0000  vancomycin (VANCOREADY) IVPB 1250 mg/250 mL        1,250 mg 166.7 mL/hr over 90 Minutes Intravenous Every 12 hours 05/15/23 1633 05/17/23 1520   05/14/23 0000  Vancomycin (VANCOCIN) 1,250 mg in sodium chloride 0.9 % 250 mL IVPB  Status:  Discontinued        1,250 mg 166.7 mL/hr over 90 Minutes Intravenous Every 12 hours 05/13/23 1626 05/15/23 1633   05/13/23 1442  ceFAZolin (ANCEF) IVPB 2g/100 mL premix        2 g 200 mL/hr over 30 Minutes Intravenous 30 min pre-op 05/13/23 1442 05/15/23 0933   05/12/23 0000  vancomycin (VANCOREADY) IVPB 1250 mg/250 mL  Status:  Discontinued        1,250 mg 166.7 mL/hr over 90 Minutes Intravenous Every 12 hours 05/11/23 1345 05/14/23 0103   05/08/23 1000  vancomycin (VANCOCIN) IVPB 1000 mg/200 mL premix  Status:  Discontinued        1,000 mg 200 mL/hr over 60 Minutes Intravenous Every 12 hours 05/07/23 2251 05/11/23 1345   05/08/23 0400  ceFEPIme (MAXIPIME) 2 g in sodium chloride 0.9 % 100 mL IVPB  Status:  Discontinued        2 g 200 mL/hr over 30 Minutes Intravenous Every 8 hours 05/07/23 2252 05/13/23 0804   05/07/23 2230  ceFEPIme (MAXIPIME) 2 g in sodium chloride 0.9 % 100 mL IVPB  Status:   Discontinued        2 g 200 mL/hr over 30 Minutes Intravenous  Once 05/07/23 2222 05/07/23 2251   05/07/23 2230  metroNIDAZOLE (FLAGYL) IVPB 500 mg  Status:  Discontinued        500 mg 100 mL/hr over 60 Minutes Intravenous Every 12 hours 05/07/23 2222 05/08/23 0953   05/07/23 2230  vancomycin (VANCOCIN) IVPB 1000 mg/200 mL premix  Status:  Discontinued        1,000 mg 200 mL/hr over 60 Minutes Intravenous  Once 05/07/23 2222 05/07/23 2224   05/07/23 2230  Vancomycin (VANCOCIN) 1,500 mg in sodium chloride 0.9 % 500 mL IVPB        1,500 mg 250 mL/hr over 120 Minutes Intravenous  Once 05/07/23 2225 05/08/23 0312   05/07/23 2130  vancomycin (VANCOCIN) IVPB 1000 mg/200 mL premix        1,000 mg 200 mL/hr over 60 Minutes Intravenous  Once 05/07/23 2128 05/08/23 0114   05/07/23 2130  piperacillin-tazobactam (ZOSYN) IVPB 3.375 g        3.375 g 100 mL/hr over 30 Minutes Intravenous  Once 05/07/23 2128 05/07/23 2216           Data Reviewed:  I have personally reviewed the following...  CBC: Recent Labs  Lab 05/14/23 0449 05/15/23 0403 05/16/23 0541 05/16/23 0901 05/18/23 0408  WBC 9.2 8.2 9.5  --  12.0*  HGB 9.3* 9.4* 9.1* 8.4* 7.0*  HCT 27.8* 28.5* 27.2* 25.1* 21.2*  MCV 84.8 87.4 85.5  --  85.8  PLT 383 339 368  --  366   Basic Metabolic Panel: Recent Labs  Lab 05/14/23 0449 05/15/23 0403 05/16/23 0541 05/18/23 0408  NA 137 132* 133* 134*  K 3.1* 3.7 3.9 3.9  CL 103 99 97* 99  CO2 26 25 25 26   GLUCOSE 113* 218* 130* 125*  BUN 12 14 8 13   CREATININE 0.87 0.79 0.73 0.78  CALCIUM 8.0* 7.8* 8.3* 8.0*   GFR: Estimated Creatinine Clearance: 126 mL/min (by C-G formula based on  SCr of 0.78 mg/dL). Liver Function Tests: No results for input(s): "AST", "ALT", "ALKPHOS", "BILITOT", "PROT", "ALBUMIN" in the last 168 hours.  No results for input(s): "LIPASE", "AMYLASE" in the last 168 hours. No results for input(s): "AMMONIA" in the last 168 hours. Coagulation  Profile: No results for input(s): "INR", "PROTIME" in the last 168 hours. Cardiac Enzymes: No results for input(s): "CKTOTAL", "CKMB", "CKMBINDEX", "TROPONINI" in the last 168 hours. BNP (last 3 results) No results for input(s): "PROBNP" in the last 8760 hours. HbA1C: No results for input(s): "HGBA1C" in the last 72 hours. CBG: Recent Labs  Lab 05/17/23 2008 05/18/23 0021 05/18/23 0419 05/18/23 0728 05/18/23 1119  GLUCAP 209* 143* 138* 131* 178*   Lipid Profile: No results for input(s): "CHOL", "HDL", "LDLCALC", "TRIG", "CHOLHDL", "LDLDIRECT" in the last 72 hours. Thyroid Function Tests: No results for input(s): "TSH", "T4TOTAL", "FREET4", "T3FREE", "THYROIDAB" in the last 72 hours. Anemia Panel: No results for input(s): "VITAMINB12", "FOLATE", "FERRITIN", "TIBC", "IRON", "RETICCTPCT" in the last 72 hours. Most Recent Urinalysis On File:     Component Value Date/Time   COLORURINE YELLOW (A) 06/12/2015 1252   APPEARANCEUR CLEAR (A) 06/12/2015 1252   LABSPEC 1.042 (H) 06/12/2015 1252   PHURINE 6.0 06/12/2015 1252   GLUCOSEU >500 (A) 06/12/2015 1252   HGBUR NEGATIVE 06/12/2015 1252   BILIRUBINUR NEGATIVE 06/12/2015 1252   KETONESUR TRACE (A) 06/12/2015 1252   PROTEINUR 100 (A) 06/12/2015 1252   NITRITE NEGATIVE 06/12/2015 1252   LEUKOCYTESUR NEGATIVE 06/12/2015 1252   Sepsis Labs: @LABRCNTIP (procalcitonin:4,lacticidven:4) Microbiology: Recent Results (from the past 240 hours)  Aerobic/Anaerobic Culture w Gram Stain (surgical/deep wound)     Status: None   Collection Time: 05/12/23  4:29 PM   Specimen: Path Tissue  Result Value Ref Range Status   Specimen Description   Final    TISSUE Performed at [facility], [address] [place], [place] [address]    Special Requests   Final    NONE Performed at [facility], [address] [place], [place] [address]    Gram Stain   Final    RARE WBC PRESENT,BOTH PMN AND MONONUCLEAR NO ORGANISMS SEEN     Culture   Final    RARE ENTEROCOCCUS FAECALIS CRITICAL RESULT CALLED TO, READ BACK BY AND VERIFIED WITH: RN kelly h. [id number] [id number] fcp NO ANAEROBES ISOLATED Performed at [facility], [address], [place], [place] [address]    Report Status 05/17/2023 FINAL  Final   Organism ID, Bacteria ENTEROCOCCUS FAECALIS  Final      Susceptibility   Enterococcus faecalis - MIC*    AMPICILLIN <=2 SENSITIVE Sensitive     VANCOMYCIN 2 SENSITIVE Sensitive     GENTAMICIN SYNERGY SENSITIVE Sensitive     * RARE ENTEROCOCCUS FAECALIS      Radiology Studies last 3 days: No results found.       Sunnie Nielsen, DO [facility] 05/18/2023, 1:14 PM    Dictation software may have been used to generate the above note. Typos may occur and escape review in typed/dictated notes. Please contact Dr Lyn Hollingshead directly for clarity if needed.  Staff may message me via secure chat in Epic  but this may not receive an immediate response,  please page me for urgent matters!  If 7PM-7AM, please contact night coverage [email or web address]

## 2023-05-18 NOTE — TOC Progression Note (Addendum)
Transition of Care [facility]) - Progression Note    Patient Details  Name: Gerald Ellis MRN: [id number] Date of Birth: [birth year]  Transition of Care [facility]) CM/SW Contact  Liliana Cline, LCSW Phone Number: 05/18/2023, 3:55 PM  Clinical Narrative:    CSW spoke to patient regarding PT recommendation changing to SNF. Patient is agreeable to SNF. He still plans to go to his sister's home once he finishes short term rehab. He requests CSW speak to his sister about SNF preferences. CSW spoke to patient's sister [name]). She states they prefer [facility] (Teresa's husband Jillyn Hidden has worked at [employer] in their Memory Care for 18 years and will talk to them about if they can take patient). If [facility] cannot accept, she is agreeable to [facility], or [facility]. She states they do not want [facility] or [facility]. Patient requests TOC to call [name] with bed offers when available at [phone]. SNF workup started.   4:40- Info uploaded to [place] Must. PASRR still pending.   Expected Discharge Plan: Home w Home Health Services Barriers to Discharge: Continued Medical Work up  Expected Discharge Plan and Services       Living arrangements for the past 2 months: Single Family Home                                       Social Determinants of Health (SDOH) Interventions SDOH Screenings   Food Insecurity: No Food Insecurity (05/08/2023)  Recent Concern: Food Insecurity - Food Insecurity Present (02/18/2023)   Received from [facility]  Housing: Unknown (05/08/2023)  Transportation Needs: No Transportation Needs (05/08/2023)  Utilities: Not At Risk (05/08/2023)  Recent Concern: Utilities - At Risk (02/18/2023)   Received from [facility]  Financial Resource Strain: Low Risk  (02/18/2023)   Received from [facility]  Recent Concern: Financial Resource Strain - High Risk (12/24/2022)   Received from [facility]  Physical Activity: Insufficiently Active (04/20/2019)  Social Connections: Moderately Isolated (04/20/2019)  Stress: No Stress Concern Present (04/20/2019)  Tobacco Use: Low Risk  (05/16/2023)    Readmission Risk Interventions     No data to display

## 2023-05-18 NOTE — Progress Notes (Signed)
2 Days Post-Op   Subjective/Chief Complaint: Notes mild pain in RIGHT BKA stump- controlled with current regimen. Otherwise without complaint.   Objective: Vital signs in last 24 hours: Temp:  [98.4 F (36.9 C)-99.3 F (37.4 C)] 98.4 F (36.9 C) (12/29 0730) Pulse Rate:  [90-97] 91 (12/29 0730) Resp:  [16-20] 16 (12/29 0730) BP: (120-133)/(61-73) 120/61 (12/29 0730) SpO2:  [91 %-95 %] 94 % (12/29 0730) Last BM Date : 05/17/23  Intake/Output from previous day: 12/28 0701 - 12/29 0700 In: 20 [I.V.:20] Out: 2000 [Urine:2000] Intake/Output this shift: No intake/output data recorded.  General appearance: alert and no distress Cardio: regular rate and rhythm Extremities: RIGHT BKA stump dressing- C/D/I, no strikethrough, thigh soft  Lab Results:  Recent Labs    05/16/23 0541 05/16/23 0901 05/18/23 0408  WBC 9.5  --  12.0*  HGB 9.1* 8.4* 7.0*  HCT 27.2* 25.1* 21.2*  PLT 368  --  366   BMET Recent Labs    05/16/23 0541 05/18/23 0408  NA 133* 134*  K 3.9 3.9  CL 97* 99  CO2 25 26  GLUCOSE 130* 125*  BUN 8 13  CREATININE 0.73 0.78  CALCIUM 8.3* 8.0*   PT/INR No results for input(s): "LABPROT", "INR" in the last 72 hours. ABG No results for input(s): "PHART", "HCO3" in the last 72 hours.  Invalid input(s): "PCO2", "PO2"  Studies/Results: No results found.  Anti-infectives: Anti-infectives (From admission, onward)    Start     Dose/Rate Route Frequency Ordered Stop   05/16/23 1227  ceFAZolin (ANCEF) IVPB 2g/100 mL premix        2 g 200 mL/hr over 30 Minutes Intravenous 30 min pre-op 05/16/23 1227 05/16/23 1329   05/16/23 0000  vancomycin (VANCOREADY) IVPB 1250 mg/250 mL        1,250 mg 166.7 mL/hr over 90 Minutes Intravenous Every 12 hours 05/15/23 1633 05/17/23 1520   05/14/23 0000  Vancomycin (VANCOCIN) 1,250 mg in sodium chloride 0.9 % 250 mL IVPB  Status:  Discontinued        1,250 mg 166.7 mL/hr over 90 Minutes Intravenous Every 12 hours 05/13/23  1626 05/15/23 1633   05/13/23 1442  ceFAZolin (ANCEF) IVPB 2g/100 mL premix        2 g 200 mL/hr over 30 Minutes Intravenous 30 min pre-op 05/13/23 1442 05/15/23 0933   05/12/23 0000  vancomycin (VANCOREADY) IVPB 1250 mg/250 mL  Status:  Discontinued        1,250 mg 166.7 mL/hr over 90 Minutes Intravenous Every 12 hours 05/11/23 1345 05/14/23 0103   05/08/23 1000  vancomycin (VANCOCIN) IVPB 1000 mg/200 mL premix  Status:  Discontinued        1,000 mg 200 mL/hr over 60 Minutes Intravenous Every 12 hours 05/07/23 2251 05/11/23 1345   05/08/23 0400  ceFEPIme (MAXIPIME) 2 g in sodium chloride 0.9 % 100 mL IVPB  Status:  Discontinued        2 g 200 mL/hr over 30 Minutes Intravenous Every 8 hours 05/07/23 2252 05/13/23 0804   05/07/23 2230  ceFEPIme (MAXIPIME) 2 g in sodium chloride 0.9 % 100 mL IVPB  Status:  Discontinued        2 g 200 mL/hr over 30 Minutes Intravenous  Once 05/07/23 2222 05/07/23 2251   05/07/23 2230  metroNIDAZOLE (FLAGYL) IVPB 500 mg  Status:  Discontinued        500 mg 100 mL/hr over 60 Minutes Intravenous Every 12 hours 05/07/23 2222 05/08/23 0953  05/07/23 2230  vancomycin (VANCOCIN) IVPB 1000 mg/200 mL premix  Status:  Discontinued        1,000 mg 200 mL/hr over 60 Minutes Intravenous  Once 05/07/23 2222 05/07/23 2224   05/07/23 2230  Vancomycin (VANCOCIN) 1,500 mg in sodium chloride 0.9 % 500 mL IVPB        1,500 mg 250 mL/hr over 120 Minutes Intravenous  Once 05/07/23 2225 05/08/23 0312   05/07/23 2130  vancomycin (VANCOCIN) IVPB 1000 mg/200 mL premix        1,000 mg 200 mL/hr over 60 Minutes Intravenous  Once 05/07/23 2128 05/08/23 0114   05/07/23 2130  piperacillin-tazobactam (ZOSYN) IVPB 3.375 g        3.375 g 100 mL/hr over 30 Minutes Intravenous  Once 05/07/23 2128 05/07/23 2216       Assessment/Plan: s/p Procedure(s): REVISION OF BELOW KNEE AMPUTATION (Right) POD #2/ 3  HgB now 7 from 8.4  No signs, symptoms of bleeding hemodynamically or upon  exam of stump Will change dressing as scheduled tomorrow Transfuse as needed upon recheck of HgB  LOS: 11 days    Eli Hose A 05/18/2023

## 2023-05-19 ENCOUNTER — Encounter: Payer: Self-pay | Admitting: Vascular Surgery

## 2023-05-19 DIAGNOSIS — L089 Local infection of the skin and subcutaneous tissue, unspecified: Secondary | ICD-10-CM | POA: Diagnosis not present

## 2023-05-19 DIAGNOSIS — E11628 Type 2 diabetes mellitus with other skin complications: Secondary | ICD-10-CM | POA: Diagnosis not present

## 2023-05-19 LAB — GLUCOSE, CAPILLARY
Glucose-Capillary: 109 mg/dL — ABNORMAL HIGH (ref 70–99)
Glucose-Capillary: 140 mg/dL — ABNORMAL HIGH (ref 70–99)
Glucose-Capillary: 195 mg/dL — ABNORMAL HIGH (ref 70–99)
Glucose-Capillary: 199 mg/dL — ABNORMAL HIGH (ref 70–99)
Glucose-Capillary: 228 mg/dL — ABNORMAL HIGH (ref 70–99)
Glucose-Capillary: 268 mg/dL — ABNORMAL HIGH (ref 70–99)

## 2023-05-19 LAB — CBC
HCT: 20.5 % — ABNORMAL LOW (ref 39.0–52.0)
Hemoglobin: 6.8 g/dL — ABNORMAL LOW (ref 13.0–17.0)
MCH: 28.1 pg (ref 26.0–34.0)
MCHC: 33.2 g/dL (ref 30.0–36.0)
MCV: 84.7 fL (ref 80.0–100.0)
Platelets: 389 10*3/uL (ref 150–400)
RBC: 2.42 MIL/uL — ABNORMAL LOW (ref 4.22–5.81)
RDW: 13 % (ref 11.5–15.5)
WBC: 11.3 10*3/uL — ABNORMAL HIGH (ref 4.0–10.5)
nRBC: 0 % (ref 0.0–0.2)

## 2023-05-19 LAB — HEMOGLOBIN AND HEMATOCRIT, BLOOD
HCT: 22.2 % — ABNORMAL LOW (ref 39.0–52.0)
Hemoglobin: 7.5 g/dL — ABNORMAL LOW (ref 13.0–17.0)

## 2023-05-19 LAB — PREPARE RBC (CROSSMATCH)

## 2023-05-19 MED ORDER — CLOPIDOGREL BISULFATE 75 MG PO TABS: 75.0000 mg | ORAL_TABLET | Freq: Every day | ORAL | Status: DC

## 2023-05-19 MED ORDER — SODIUM CHLORIDE 0.9% IV SOLUTION: Freq: Once | INTRAVENOUS | Status: DC

## 2023-05-19 NOTE — Progress Notes (Signed)
PT Cancellation Note  Patient Details Name: RAMA BROERING MRN: [id number] DOB: 1965/12/23   Cancelled Treatment:    Reason Eval/Treat Not Completed: Other (comment).  Pt currently receiving blood transfusion (Hgb 6.8 today).  Will hold therapy at this time and re-attempt PT session at a later date/time.  Hendricks Limes, PT 05/19/23, 4:23 PM

## 2023-05-19 NOTE — Evaluation (Addendum)
Occupational Therapy Re-Evaluation Patient Details Name: Gerald Ellis MRN: [id number] DOB: May 17, 1966 Today's Date: 05/19/2023   History of Present Illness 57 y.o. male with medical history significant for HTN, HLD, CAD s/p stent, DM with neuropathy, chronic back pain on opiates, anxiety, chronic ulcer first right MTP, followed by podiatry (last seen 02/18/2023), a with normal resting ABI at the time and treated with topical dressings who presents to the ED with a 1 week history of blistering in the area of the wound and then over the past day he noted that his second and third toes of the right foot were turning black. 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 NWB to RLE, s/p I&D 12/23 to RLE.  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  S/p 05/16/23 BKA stump washout/revision (d/t hematoma).   Clinical Impression   Pt seen for re-evaluation after pt had R BKA on 12/26 and subsequent stump washout/revision on 12/27. Per MD via secure chat, pt cleared to work with therapy despite Hgb 6.8 and pending blood transfusion. Goals reviewed and updated per progress. Pt in bed, endorses minimal discomfort at rest. Pt completed bed mobility with mod indep. Pt required SBA-CGA for ADL transfers from EOB and from [facility] over toilet, CGA for mobility to/from the bathroom with RW. Pt tolerated standing with LUE forearm support on the RW while completing pericare in standing with no LOB noted. Pt endorsed increased pain/discomfort in residual limb with return to bed. RN notified. Pt educated in BKA strategies for desensitization, phantom limb pain/discomfort, residual limb positioning, importance of maintaining knee extension, and falls prevention. Pt verbalized understanding. He continues to benefit from skilled OT services to maximize his recovery and independence.       If plan is discharge home, recommend the following: A little help with walking and/or transfers;A little help with bathing/dressing/bathroom;Assistance with  cooking/housework;Assist for transportation;Help with stairs or ramp for entrance    Functional Status Assessment  Patient has had a recent decline in their functional status and demonstrates the ability to make significant improvements in function in a reasonable and predictable amount of time.  Equipment Recommendations  BSC/3in1    Recommendations for Other Services       Precautions / Restrictions Precautions Precautions: Fall Restrictions Weight Bearing Restrictions Per Provider Order: Yes RLE Weight Bearing Per Provider Order: Non weight bearing Other Position/Activity Restrictions: R BKA      Mobility Bed Mobility Overal bed mobility: Modified Independent                  Transfers Overall transfer level: Needs assistance Equipment used: Rolling walker (2 wheels) Transfers: Sit to/from Stand Sit to Stand: Supervision, Contact guard assist           General transfer comment: CGA from EOB, SBA from [facility] over toilet      Balance Overall balance assessment: Needs assistance Sitting-balance support: No upper extremity supported, Feet unsupported, Feet supported Sitting balance-Leahy Scale: Normal     Standing balance support: Single extremity supported, Reliant on assistive device for balance, During functional activity Standing balance-Leahy Scale: Fair Standing balance comment: able to stand with L forearm support on the RW while leaning forward to perform pericare in standing without LOB                           ADL either performed or assessed with clinical judgement   ADL Overall ADL's : Needs assistance/impaired  Toilet Transfer: Contact guard assist;Supervision/safety;Rolling walker (2 wheels);Regular Toilet;BSC/3in1 Statistician Details (indicate cue type and reason): BSC over toilet, use of rails to help lower himself slowly Toileting- Clothing Manipulation and Hygiene: Sit to/from  stand;Supervision/safety Toileting - Clothing Manipulation Details (indicate cue type and reason): L forearm support on RW while completing pericare in standing using dom R hand     Functional mobility during ADLs: Contact guard assist;Rolling walker (2 wheels)       Vision         Perception         Praxis         Pertinent Vitals/Pain Pain Assessment Pain Assessment: 0-10 Pain Score: 7  Pain Location: R BKA/residual limb Pain Descriptors / Indicators: Aching, Throbbing Pain Intervention(s): Monitored during session, Limited activity within patient's tolerance, Repositioned, Patient requesting pain meds-RN notified     Extremity/Trunk Assessment Upper Extremity Assessment Upper Extremity Assessment: Overall WFL for tasks assessed   Lower Extremity Assessment Lower Extremity Assessment: Generalized weakness;RLE deficits/detail;Defer to PT evaluation RLE Deficits / Details: R BKA RLE: Unable to fully assess due to pain   Cervical / Trunk Assessment Cervical / Trunk Assessment: Normal   Communication Communication Communication: No apparent difficulties   Cognition Arousal: Alert Behavior During Therapy: WFL for tasks assessed/performed Overall Cognitive Status: Within Functional Limits for tasks assessed                                       General Comments  R BKA dressing in place and appears clean/dry    Exercises Other Exercises Other Exercises: Pt educated in BKA strategies for desensitization, phantom limb pain/discomfort, residual limb positioning, importance of maintaining knee extension, and falls prevention   Shoulder Instructions      Home Living Family/patient expects to be discharged to:: Private residence Living Arrangements: Alone Available Help at Discharge: Family;Available PRN/intermittently Type of Home: House Home Access: Stairs to enter (pt reports plan to put in ramp soon) Entrance Stairs-Number of Steps: 6 Entrance  Stairs-Rails: Right;Left Home Layout: Two level;Able to live on main level with bedroom/bathroom     Bathroom Shower/Tub: Tub/shower unit;Walk-in shower   Bathroom Toilet: Handicapped height     Home Equipment: Toilet riser;BSC/3in1   Additional Comments: Pt reports he could stay with his sister (1 level home with ramp to enter; walk-in shower with built in shower seat and grab bar; elevated toilet with grab bar)      Prior Functioning/Environment Prior Level of Function : Independent/Modified Independent;Driving             Mobility Comments: Independent with ambulation and driving ADLs Comments: Independent with ADL's and IADL's        OT Problem List: Decreased strength;Pain;Impaired balance (sitting and/or standing);Decreased knowledge of use of DME or AE;Decreased knowledge of precautions      OT Treatment/Interventions: Self-care/ADL training;Therapeutic exercise;Therapeutic activities;DME and/or AE instruction;Patient/family education;Balance training;Manual therapy    OT Goals(Current goals can be found in the care plan section) Acute Rehab OT Goals Patient Stated Goal: get better and go home OT Goal Formulation: With patient Time For Goal Achievement: 06/02/23 Potential to Achieve Goals: Good  OT Frequency: Min 1X/week    Co-evaluation              AM-PAC OT "6 Clicks" Daily Activity     Outcome Measure Help from another person eating meals?: None Help from another person  taking care of personal grooming?: None Help from another person toileting, which includes using toliet, bedpan, or urinal?: A Little Help from another person bathing (including washing, rinsing, drying)?: A Little Help from another person to put on and taking off regular upper body clothing?: None Help from another person to put on and taking off regular lower body clothing?: A Little 6 Click Score: 21   End of Session Equipment Utilized During Treatment: Rolling walker (2  wheels) Nurse Communication: Patient requests pain meds;Mobility status  Activity Tolerance: Patient tolerated treatment well Patient left: in bed;with call bell/phone within reach;with bed alarm set  OT Visit Diagnosis: Other abnormalities of gait and mobility (R26.89);Pain Pain - Right/Left: Right Pain - part of body: Leg                Time: 2595-6387 OT Time Calculation (min): 37 min Charges:  OT General Charges $OT Visit: 1 Visit OT Evaluation $OT Re-eval: 1 Re-eval OT Treatments $Self Care/Home Management : 8-22 mins $Therapeutic Activity: 8-22 mins  Arman Filter., MPH, MS, OTR/L ascom [phone] 05/19/23, 3:21 PM

## 2023-05-19 NOTE — Progress Notes (Signed)
  Progress Note    05/19/2023 12:27 PM 3 Days Post-Op  Subjective:  Gerald Ellis is a 57 year old male now postop day #4 from a right below the knee amputation and POD #3 from wound washout.  Patient is resting comfortably in bed this morning.  Family is at the bedside.  Patient does endorse some soreness and some nerve phantom pain but otherwise states it is tolerable.  No complaints overnight.  Vitals all remained stable.   Vitals:   05/19/23 0031 05/19/23 0803  BP: 119/64 128/72  Pulse: 90 90  Resp: 18 17  Temp: 98 F (36.7 C) 98.5 F (36.9 C)  SpO2: 95% 95%   Physical Exam: Cardiac:  RRR, normal S1 and S2.  No murmurs appreciated. Lungs: Non-labored breathing, clear on auscultation throughout.  No rales rhonchi or wheezing noted. Incisions: Right lower extremity now below the knee amputation. No bleeding noted.  Extremities: Right BKA. Dressing clean dry and intact. No bleeding noted. Left lower extremity warm to touch with palpable pulses.  Abdomen: Positive bowel sounds throughout, soft, nontender and nondistended. Neurologic: Alert and oriented x 3, answers all questions and follows commands appropriately.   CBC    Component Value Date/Time   WBC 11.3 (H) 05/19/2023 0414   RBC 2.42 (L) 05/19/2023 0414   HGB 6.8 (L) 05/19/2023 0414   HCT 20.5 (L) 05/19/2023 0414   HCT 48.9 07/31/2015 0838   PLT 389 05/19/2023 0414   MCV 84.7 05/19/2023 0414   MCH 28.1 05/19/2023 0414   MCHC 33.2 05/19/2023 0414   RDW 13.0 05/19/2023 0414   LYMPHSABS 1.8 05/07/2023 1921   MONOABS 0.7 05/07/2023 1921   EOSABS 0.1 05/07/2023 1921   BASOSABS 0.0 05/07/2023 1921    BMET    Component Value Date/Time   NA 134 (L) 05/18/2023 0408   K 3.9 05/18/2023 0408   CL 99 05/18/2023 0408   CO2 26 05/18/2023 0408   GLUCOSE 125 (H) 05/18/2023 0408   BUN 13 05/18/2023 0408   CREATININE 0.78 05/18/2023 0408   CALCIUM 8.0 (L) 05/18/2023 0408   GFRNONAA >60 05/18/2023 0408   GFRAA >60  04/21/2019 0515    INR    Component Value Date/Time   INR 1.0 03/21/2021 1849     Intake/Output Summary (Last 24 hours) at 05/19/2023 1227 Last data filed at 05/19/2023 0547 Gross per 24 hour  Intake 240 ml  Output 1250 ml  Net -1010 ml     Assessment/Plan:  57 y.o. male is s/p Right lower extremity BKA with Wound washout.  3 Days Post-Op   PLAN: Start Plavix 75 mg Daily and continue ASA 81 mg daily and Lipitor 80 mg daily.  Vascular surgery to do first dressing change tomorrow 05/20/2023. Pain medication as needed Advance diet as tolerated. Continue to work with PT OT. Disposition plan is rehab.  DVT prophylaxis:  ASA 81 mg daily   Marcie [facility] 05/19/2023 12:27 PM

## 2023-05-19 NOTE — Progress Notes (Signed)
IV in right lateral forearm. PLEASE pass along in report that SecurePortIV (glue) was used beneath his dressing and adhesive remover MUST be used when removing IV and dressing

## 2023-05-19 NOTE — Plan of Care (Signed)
  Problem: Coping: Goal: Ability to adjust to condition or change in health will improve Outcome: Progressing   Problem: Metabolic: Goal: Ability to maintain appropriate glucose levels will improve Outcome: Progressing   Problem: Nutritional: Goal: Maintenance of adequate nutrition will improve Outcome: Progressing   Problem: Tissue Perfusion: Goal: Adequacy of tissue perfusion will improve Outcome: Progressing   Problem: Fluid Volume: Goal: Hemodynamic stability will improve Outcome: Progressing   Problem: Education: Goal: Knowledge of General Education information will improve Description: Including pain rating scale, medication(s)/side effects and non-pharmacologic comfort measures Outcome: Progressing   Problem: Health Behavior/Discharge Planning: Goal: Ability to manage health-related needs will improve Outcome: Progressing   Problem: Clinical Measurements: Goal: Will remain free from infection Outcome: Progressing Goal: Diagnostic test results will improve Outcome: Progressing Goal: Respiratory complications will improve Outcome: Progressing   Problem: Activity: Goal: Risk for activity intolerance will decrease Outcome: Progressing   Problem: Nutrition: Goal: Adequate nutrition will be maintained Outcome: Progressing   Problem: Elimination: Goal: Will not experience complications related to bowel motility Outcome: Progressing Goal: Will not experience complications related to urinary retention Outcome: Progressing   Problem: Pain Management: Goal: General experience of comfort will improve Outcome: Progressing   Problem: Safety: Goal: Ability to remain free from injury will improve Outcome: Progressing

## 2023-05-19 NOTE — Progress Notes (Signed)
Progress Note   Patient: Gerald Ellis ZOX:[id number] DOB: [birth year] DOA: 05/07/2023     12 DOS: the patient was seen and examined on 05/19/2023   Brief hospital course:  HPI: Gerald Ellis is a 57 y.o. male with medical history significant for HTN, HLD, CAD s/p stent, DM with neuropathy, chronic back pain on opiates,, anxiety, chronic ulcer first right MTP, followed by podiatry (last seen 02/18/2023), a with normal resting ABI at the time and treated with topical dressings who presents to the ED on 05/07/23 with a 1 week history of blistering in the area of the wound and then over the past day he noted that his second and third toes of the right foot were turning black.    Hospital course / significant events:  12/19: admitted to hospitalist service, cefepime + vanc, podiatry and vascular consults, underwent transmetatarsal amputation R foot 12/20: underwent angiography RLE w/ stent placement  12/21: monitoring post-revascularization 12/22: podiatry reeval, planning to OR for washout/I&D tomorrow given sub-cutaneous gas on x-ray today. Wound Cx (+)mrsa, enterococcus  12/23: underwent irrigation/debridement R foot, washout  12/24: foot not healing / not salvageable, planning BKA.  12/25: stable, await BKA tomorrow  12/26: underwent BKA 12/27: bleeding from stump overnight, back to OR for revision d/t hematoma   12/28: stable, planning for bandage change Mon 12/30 12/29: Hgb 7.0, pt would like to defer transfusion, follow CBC in AM     Consultants:  Podiatry  Vascular Surgery  Infectious disease    Procedures/Surgeries: 05/08/2023: Transmetatarsal amputation right foot - Dr Alberteen Spindle  05/09/2023: RLE angioplasty w/ stent to superficial femoral, popliteal, anterior tibial arteries - Dr. 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  ID following - continue abx vancomycin for 48h postop  Pain control including antispasmodic  Surgical interventions as above - currently s/p BKA RLE   PAD  S/p RLE angioplasty w/ stent 05/09/2023 ASA, Prasugrel, statin Vascular surgery following    Acute on chronic anemia Likely ABLA d/t multiple surgical procedures  Hemoglobin 6.8 on 05/19/2023 patient has agreed for 1 unit of blood transfusion Monitor CBC   DM2, hyperglycemia  Continue SSI + basal    Anxiety Continue BuSpar    GERD Continue PPI   Hypokalemia Replace as needed Monitor BMP   HTN CAD Metoprolol, Imdur, amlodipine Consider ACE/ARB ASA, Statin. Prasugrel   Debility, ambulatory dysfunction  HH PT/OT   R BKA stump hematoma - corrected w/ revision surgery 12/27 S/p revision 05/16/2023 Vascular surgery monitoring  Transfuse 1 units of blood   Obesity Class 1 based on BMI: Body mass index is 33.72 kg/m.  Counseled on weight loss when medically stable   DVT prophylaxis: lovenox  IV fluids: no continuous IV fluids  Nutrition: diabetic diet  Central lines / invasive devices: none   Code Status: FULL CODE ACP documentation reviewed:  none on file in [facility]   TOC needs: SNF/rehab now that has BKA  Barriers to dispo / significant pending items: PT/OT as able,SNF placement   Family Communication: none at this time, pt declines call to a support person at this time     Subjective / Brief ROS:  Patient seen and examined  at bedside this morning Denies nausea vomiting abdominal pain or chest pain Hemoglobin noted to be 6.8 Patient agreed to blood transfusion      Physical Exam Constitutional:      General: He is not in acute distress.    Appearance: He is not ill-appearing.  Cardiovascular:     Rate and Rhythm: Normal rate  and regular rhythm.  Pulmonary:     Effort: Pulmonary effort is normal.     Breath sounds: Normal breath sounds.  Musculoskeletal:     Comments: RLE stump is wrapped, no bleeding apparent  Neurological:     General: No focal deficit present.     Mental Status: He is alert and oriented to person, place, and time.  Psychiatric:        Mood and Affect: Mood normal.        Behavior: Behavior normal.    Data Reviewed:    Latest Ref Rng & Units 05/18/2023    4:08 AM 05/16/2023    5:41 AM 05/15/2023    4:03 AM  BMP  Glucose 70 - 99 mg/dL 518  841  660   BUN 6 - 20 mg/dL 13  8  14    Creatinine 0.61 - 1.24 mg/dL 6.30  1.60  1.09   Sodium 135 - 145 mmol/L 134  133  132   Potassium 3.5 - 5.1 mmol/L 3.9  3.9  3.7   Chloride 98 - 111 mmol/L 99  97  99   CO2 22 - 32 mmol/L 26  25  25    Calcium 8.9 - 10.3 mg/dL 8.0  8.3  7.8        Latest Ref Rng & Units 05/19/2023    4:14 AM 05/18/2023    4:08 AM 05/16/2023    9:01 AM  CBC  WBC 4.0 - 10.5 K/uL 11.3  12.0    Hemoglobin 13.0 - 17.0 g/dL 6.8  7.0  8.4   Hematocrit 39.0 - 52.0 % 20.5  21.2  25.1   Platelets 150 - 400 K/uL 389  366      Vitals:   05/18/23 0730 05/18/23 1546 05/19/23 0031 05/19/23 0803  BP: 120/61 138/71 119/64 128/72  Pulse: 91 94 90 90  Resp: 16 16 18 17   Temp: 98.4 F (36.9 C) 98.2 F (36.8 C) 98 F (36.7 C) 98.5 F (36.9 C)  TempSrc: Oral Oral Oral   SpO2: 94% 94% 95% 95%  Weight:      Height:          Time spent: 40 minutes  Author: Loyce Dys, MD 05/19/2023 2:36 PM  For on call review [email or web address].

## 2023-05-20 DIAGNOSIS — L089 Local infection of the skin and subcutaneous tissue, unspecified: Secondary | ICD-10-CM | POA: Diagnosis not present

## 2023-05-20 DIAGNOSIS — E11628 Type 2 diabetes mellitus with other skin complications: Secondary | ICD-10-CM | POA: Diagnosis not present

## 2023-05-20 LAB — CBC WITH DIFFERENTIAL/PLATELET
Abs Immature Granulocytes: 0.1 10*3/uL — ABNORMAL HIGH (ref 0.00–0.07)
Basophils Absolute: 0.1 10*3/uL (ref 0.0–0.1)
Basophils Relative: 0 %
Eosinophils Absolute: 0.5 10*3/uL (ref 0.0–0.5)
Eosinophils Relative: 4 %
HCT: 23.3 % — ABNORMAL LOW (ref 39.0–52.0)
Hemoglobin: 7.8 g/dL — ABNORMAL LOW (ref 13.0–17.0)
Immature Granulocytes: 1 %
Lymphocytes Relative: 23 %
Lymphs Abs: 2.7 10*3/uL (ref 0.7–4.0)
MCH: 27.9 pg (ref 26.0–34.0)
MCHC: 33.5 g/dL (ref 30.0–36.0)
MCV: 83.2 fL (ref 80.0–100.0)
Monocytes Absolute: 0.7 10*3/uL (ref 0.1–1.0)
Monocytes Relative: 5 %
Neutro Abs: 8 10*3/uL — ABNORMAL HIGH (ref 1.7–7.7)
Neutrophils Relative %: 67 %
Platelets: 427 10*3/uL — ABNORMAL HIGH (ref 150–400)
RBC: 2.8 MIL/uL — ABNORMAL LOW (ref 4.22–5.81)
RDW: 14.6 % (ref 11.5–15.5)
WBC: 12 10*3/uL — ABNORMAL HIGH (ref 4.0–10.5)
nRBC: 0 % (ref 0.0–0.2)

## 2023-05-20 LAB — BASIC METABOLIC PANEL
Anion gap: 7 (ref 5–15)
BUN: 15 mg/dL (ref 6–20)
CO2: 27 mmol/L (ref 22–32)
Calcium: 8.2 mg/dL — ABNORMAL LOW (ref 8.9–10.3)
Chloride: 100 mmol/L (ref 98–111)
Creatinine, Ser: 0.84 mg/dL (ref 0.61–1.24)
GFR, Estimated: >60 mL/min (ref >60)
Glucose, Bld: 141 mg/dL — ABNORMAL HIGH (ref 70–99)
Potassium: 3.8 mmol/L (ref 3.5–5.1)
Sodium: 134 mmol/L — ABNORMAL LOW (ref 135–145)

## 2023-05-20 LAB — TYPE AND SCREEN
ABO/RH(D): O POS
Antibody Screen: NEGATIVE
Unit division: 0

## 2023-05-20 LAB — GLUCOSE, CAPILLARY
Glucose-Capillary: 176 mg/dL — ABNORMAL HIGH (ref 70–99)
Glucose-Capillary: 140 mg/dL — ABNORMAL HIGH (ref 70–99)
Glucose-Capillary: 111 mg/dL — ABNORMAL HIGH (ref 70–99)
Glucose-Capillary: 170 mg/dL — ABNORMAL HIGH (ref 70–99)
Glucose-Capillary: 227 mg/dL — ABNORMAL HIGH (ref 70–99)
Glucose-Capillary: 247 mg/dL — ABNORMAL HIGH (ref 70–99)

## 2023-05-20 LAB — BPAM RBC
Blood Product Expiration Date: 202412312359
ISSUE DATE / TIME: 202412301436
Unit Type and Rh: 9500

## 2023-05-20 MED ORDER — HYDROMORPHONE HCL 1 MG/ML IJ SOLN
1.0000 mg | INTRAMUSCULAR | Status: DC | PRN
  Administered 2023-05-20 – 2023-05-26 (×7): 1 mg via INTRAVENOUS
  Filled 2023-05-20 (×7): qty 1

## 2023-05-20 MED ORDER — CYCLOBENZAPRINE HCL 10 MG PO TABS
5.0000 mg | ORAL_TABLET | Freq: Three times a day (TID) | ORAL | Status: AC
  Administered 2023-05-20 – 2023-05-22 (×9): 5 mg via ORAL
  Filled 2023-05-20 (×9): qty 1

## 2023-05-20 NOTE — Progress Notes (Signed)
 Progress Note   Patient: Gerald Ellis FMW:[id number] DOB: 1966-04-26 DOA: 05/07/2023     13 DOS: the patient was seen and examined on 05/20/2023   Brief hospital course:  HPI: [name] is a 57 y.o. male with medical history significant for HTN, HLD, CAD s/p stent, DM with neuropathy, chronic back pain on opiates,, anxiety, chronic ulcer first right MTP, followed by podiatry (last seen 02/18/2023), a with normal resting ABI at the time and treated with topical dressings who presents to the ED on 05/07/23 with a 1 week history of blistering in the area of the wound and then over the past day he noted that his second and third toes of the right foot were turning black.    Hospital course / significant events:  12/19: admitted to hospitalist service, cefepime  + vanc, podiatry and vascular consults, underwent transmetatarsal amputation R foot 12/20: underwent angiography RLE w/ stent placement  12/21: monitoring post-revascularization 12/22: podiatry reeval, planning to OR for washout/I&D tomorrow given sub-cutaneous gas on x-ray today. Wound Cx (+)mrsa, enterococcus  12/23: underwent irrigation/debridement R foot, washout  12/24: foot not healing / not salvageable, planning BKA.  12/25: stable, await BKA tomorrow  12/26: underwent BKA 12/27: bleeding from stump overnight, back to OR for revision d/t hematoma   12/28: stable, planning for bandage change Mon 12/30 12/29: Hgb 7.0, pt would like to defer transfusion, follow CBC in AM     Consultants:  Podiatry  Vascular Surgery  Infectious disease    Procedures/Surgeries: 05/08/2023: Transmetatarsal amputation right foot - Dr Neill  05/09/2023: RLE angioplasty w/ stent to superficial femoral, popliteal, anterior tibial arteries - Dr. 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  ID following - continue abx vancomycin  for 48h postop  Continue pain control including antispasmodic  Surgical interventions as above - currently s/p BKA RLE   PAD  S/p RLE angioplasty w/ stent 05/09/2023 ASA, Prasugrel , statin Discussed with vascular surgery team   Acute on chronic anemia Likely ABLA d/t multiple surgical procedures  Hemoglobin 6.8 on 05/19/2023 and received 1 unit  Continue to monitor CBC   DM2, hyperglycemia  Continue SSI + basal    Anxiety Continue BuSpar     GERD Continue PPI   Hypokalemia Monitor potassium closely   HTN CAD Metoprolol , Imdur , amlodipine  Consider ACE/ARB ASA, Statin. Prasugrel    Debility, ambulatory dysfunction  HH PT/OT   R BKA stump hematoma - corrected w/ revision surgery 12/27 S/p revision 05/16/2023 Vascular surgery monitoring  Patient received 1 unit of blood transfusion on 05/19/2023   Obesity Class 1 based on BMI: Body mass index is 33.72 kg/m.  Counseled on weight loss when medically stable   DVT prophylaxis: lovenox   IV fluids: no continuous IV fluids  Nutrition: diabetic diet  Central lines / invasive devices: none   Code Status: FULL CODE ACP documentation reviewed:  none on file in [facility]   TOC needs: SNF/rehab now that has BKA  Barriers to dispo / significant pending items: PT/OT as able,SNF placement    Family Communication: none at this time, pt declines call to a support person at this time     Subjective / Brief ROS:  Complaining of pain in the right amputation stump Denies nausea vomiting chest pain     Physical Exam Constitutional:      General: He is not in acute distress.    Appearance: He is not ill-appearing.  Cardiovascular:     Rate and Rhythm: Normal rate and regular rhythm.  Pulmonary:     Effort: Pulmonary  effort is normal.     Breath sounds: Normal breath sounds.  Musculoskeletal:     Comments: RLE stump is wrapped, no bleeding apparent  Neurological:     General: No focal deficit present.     Mental Status: He is alert and oriented to person, place, and time.  Psychiatric:        Mood and Affect: Mood normal.        Behavior: Behavior normal.     Data Reviewed:    Latest Ref Rng & Units 05/20/2023    4:01 AM 05/19/2023    8:25 PM 05/19/2023    4:14 AM  CBC  WBC 4.0 - 10.5 K/uL 12.0   11.3   Hemoglobin 13.0 - 17.0 g/dL 7.8  7.5  6.8   Hematocrit 39.0 - 52.0 % 23.3  22.2  20.5   Platelets 150 - 400 K/uL 427   389        Latest Ref Rng & Units 05/20/2023    4:01 AM 05/18/2023    4:08 AM 05/16/2023    5:41 AM  BMP  Glucose 70 - 99 mg/dL 858  874  869   BUN 6 - 20 mg/dL 15  13  8    Creatinine 0.61 - 1.24 mg/dL 9.15  9.21  9.26   Sodium 135 - 145 mmol/L 134  134  133   Potassium 3.5 - 5.1 mmol/L 3.8  3.9  3.9   Chloride 98 - 111 mmol/L 100  99  97   CO2 22 - 32 mmol/L 27  26  25    Calcium  8.9 - 10.3 mg/dL 8.2  8.0  8.3     Vitals:   05/19/23 2330 05/20/23 0234 05/20/23 0741 05/20/23 1624  BP: (!) 140/82 136/82 (!) 142/79 121/70  Pulse: 86 77 89 81  Resp: 16 17 18 18   Temp: 98.4 F (36.9 C) 98.4 F (36.9 C) 99 F (37.2 C) 98.2 F (36.8 C)  TempSrc:   Oral   SpO2: 96% 95% 94% 96%  Weight:      Height:         Author: Drue ONEIDA Potter, MD 05/20/2023 5:24 PM  For on call review [email or web address].

## 2023-05-20 NOTE — Progress Notes (Signed)
  Progress Note    05/20/2023 11:25 AM 4 Days Post-Op  Subjective:  Gerald Ellis is a 57 year old male now postop day #4 from a right below the knee amputation and POD #4 from wound washout.  Patient is resting comfortably in bed this morning.  Family is at the bedside.  Patient does endorse some soreness and some nerve phantom pain but otherwise states it is tolerable.  No complaints overnight.  Vitals all remained stable.    Vitals:   05/20/23 0234 05/20/23 0741  BP: 136/82 (!) 142/79  Pulse: 77 89  Resp: 17 18  Temp: 98.4 F (36.9 C) 99 F (37.2 C)  SpO2: 95% 94%   Physical Exam: Cardiac:  RRR, normal S1 and S2.  No murmurs appreciated. Lungs: Non-labored breathing, clear on auscultation throughout.  No rales rhonchi or wheezing noted. Incisions: Right lower extremity now below the knee amputation. No bleeding noted.  Extremities: Right BKA. Dressing changed today. No bleeding noted. Left lower extremity warm to touch with palpable pulses.  Abdomen: Positive bowel sounds throughout, soft, nontender and nondistended. Neurologic: Alert and oriented x 3, answers all questions and follows commands appropriately.  CBC    Component Value Date/Time   WBC 12.0 (H) 05/20/2023 0401   RBC 2.80 (L) 05/20/2023 0401   HGB 7.8 (L) 05/20/2023 0401   HCT 23.3 (L) 05/20/2023 0401   HCT 48.9 07/31/2015 0838   PLT 427 (H) 05/20/2023 0401   MCV 83.2 05/20/2023 0401   MCH 27.9 05/20/2023 0401   MCHC 33.5 05/20/2023 0401   RDW 14.6 05/20/2023 0401   LYMPHSABS 2.7 05/20/2023 0401   MONOABS 0.7 05/20/2023 0401   EOSABS 0.5 05/20/2023 0401   BASOSABS 0.1 05/20/2023 0401    BMET    Component Value Date/Time   NA 134 (L) 05/20/2023 0401   K 3.8 05/20/2023 0401   CL 100 05/20/2023 0401   CO2 27 05/20/2023 0401   GLUCOSE 141 (H) 05/20/2023 0401   BUN 15 05/20/2023 0401   CREATININE 0.84 05/20/2023 0401   CALCIUM  8.2 (L) 05/20/2023 0401   GFRNONAA >60 05/20/2023 0401   GFRAA >60  04/21/2019 0515    INR    Component Value Date/Time   INR 1.0 03/21/2021 1849     Intake/Output Summary (Last 24 hours) at 05/20/2023 1125 Last data filed at 05/20/2023 0008 Gross per 24 hour  Intake 764.58 ml  Output 900 ml  Net -135.42 ml     Assessment/Plan:  57 y.o. male is s/p Right lower extremity BKA with Wound washout.  4 Days Post-Op   PLAN: Continue Plavix  75 mg Daily and continue ASA 81 mg daily and Lipitor  80 mg daily. Patient to be discharged on these medications. Vascular surgery completed first dressing change today 05/20/2023. Pain medication as needed Advance diet as tolerated. Continue to work with PT OT. Okay to Disposition to rehab.   DVT prophylaxis:  ASA 81 mg daily   Gerald [facility] 05/20/2023 11:25 AM

## 2023-05-20 NOTE — Progress Notes (Signed)
 Physical Therapy Treatment Patient Details Name: Gerald Ellis MRN: [id number] DOB: Jul 03, 1965 Today's Date: 05/20/2023   History of Present Illness 57 y.o. male with medical history significant for HTN, HLD, CAD s/p stent, DM with neuropathy, chronic back pain on opiates, anxiety, chronic ulcer first right MTP, followed by podiatry (last seen 02/18/2023), a with normal resting ABI at the time and treated with topical dressings who presents to the ED with a 1 week history of blistering in the area of the wound and then over the past day he noted that his second and third toes of the right foot were turning black. 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 NWB to RLE, s/p I&D 12/23 to RLE.  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  S/p 05/16/23 BKA stump washout/revision (d/t hematoma).    PT Comments  Pt resting in bed upon PT arrival; pt reporting 6-7/10 R LE residual limb pain at rest (pt reports being given pain medication earlier today d/t significant 10/10 pain after dressing change).  Attempted R residual limb LE ex's but limited d/t muscle spasms (10/10 during spasms; 6-7/10 at rest).  Pt educated on importance of R knee flexion/extension ROM and optimal positioning--pt verbalizing understanding.  Pt declined further activity d/t pain concerns so session ended.  Nurse present end of session addressing pt's pain.  Will attempt to progress functional mobility per pt tolerance.   If plan is discharge home, recommend the following: A little help with walking and/or transfers;A little help with bathing/dressing/bathroom;Assistance with cooking/housework;Assist for transportation;Help with stairs or ramp for entrance   Can travel by private vehicle        Equipment Recommendations  Rolling walker (2 wheels);BSC/3in1;Wheelchair (measurements PT);Wheelchair cushion (measurements PT)    Recommendations for Other Services       Precautions / Restrictions Precautions Precautions: Fall Restrictions Weight  Bearing Restrictions Per Provider Order: Yes RLE Weight Bearing Per Provider Order: Non weight bearing Other Position/Activity Restrictions: R BKA     Mobility  Bed Mobility               General bed mobility comments: Pt declined OOB mobility d/t pain    Transfers                        Ambulation/Gait                   Stairs             Wheelchair Mobility     Tilt Bed    Modified Rankin (Stroke Patients Only)       Balance                                            Cognition Arousal: Alert Behavior During Therapy: WFL for tasks assessed/performed Overall Cognitive Status: Within Functional Limits for tasks assessed                                          Exercises Amputee Exercises Quad Sets: AROM, Strengthening, Right, 5 reps (limited d/t R LE residual limb muscle spasms) Knee Flexion: AROM, Strengthening, Right, 10 reps, Supine    General Comments General comments (skin integrity, edema, etc.): R BKA dressing in place (appearing clean/dry)  Pertinent Vitals/Pain Pain Assessment Pain Assessment: 0-10 Pain Score: 7  (10/10 when muscle spasms occur) Pain Location: R BKA/residual limb Pain Descriptors / Indicators: Aching, Throbbing, Spasm, Grimacing, Guarding Pain Intervention(s): Limited activity within patient's tolerance, Monitored during session, Repositioned, Premedicated before session, Patient requesting pain meds-RN notified (RN present end of session to address pt's pain)    Home Living                          Prior Function            PT Goals (current goals can now be found in the care plan section) Acute Rehab PT Goals Patient Stated Goal: improve pain and walking PT Goal Formulation: With patient Time For Goal Achievement: 05/31/23 Potential to Achieve Goals: Good Additional Goals Additional Goal #1: Pt modified independent with w/c level functional  mobility x125 feet. Progress towards PT goals: Progressing toward goals    Frequency    Min 1X/week      PT Plan      Co-evaluation              AM-PAC PT 6 Clicks Mobility   Outcome Measure  Help needed turning from your back to your side while in a flat bed without using bedrails?: None Help needed moving from lying on your back to sitting on the side of a flat bed without using bedrails?: None Help needed moving to and from a bed to a chair (including a wheelchair)?: A Little Help needed standing up from a chair using your arms (e.g., wheelchair or bedside chair)?: A Little Help needed to walk in hospital room?: A Little Help needed climbing 3-5 steps with a railing? : A Lot 6 Click Score: 19    End of Session   Activity Tolerance: Patient limited by pain Patient left: in bed;with call bell/phone within reach;with bed alarm set;with nursing/sitter in room Nurse Communication: Patient requests pain meds;Precautions;Weight bearing status PT Visit Diagnosis: Muscle weakness (generalized) (M62.81);Difficulty in walking, not elsewhere classified (R26.2);Other abnormalities of gait and mobility (R26.89)     Time: 8592-8576 PT Time Calculation (min) (ACUTE ONLY): 16 min  Charges:    $Therapeutic Exercise: 8-22 mins PT General Charges $$ ACUTE PT VISIT: 1 Visit                     Damien Caulk, PT 05/20/23, 3:41 PM

## 2023-05-20 NOTE — Plan of Care (Signed)

## 2023-05-21 DIAGNOSIS — L089 Local infection of the skin and subcutaneous tissue, unspecified: Secondary | ICD-10-CM | POA: Diagnosis not present

## 2023-05-21 DIAGNOSIS — E11628 Type 2 diabetes mellitus with other skin complications: Secondary | ICD-10-CM | POA: Diagnosis not present

## 2023-05-21 LAB — GLUCOSE, CAPILLARY
Glucose-Capillary: 115 mg/dL — ABNORMAL HIGH (ref 70–99)
Glucose-Capillary: 158 mg/dL — ABNORMAL HIGH (ref 70–99)
Glucose-Capillary: 174 mg/dL — ABNORMAL HIGH (ref 70–99)
Glucose-Capillary: 197 mg/dL — ABNORMAL HIGH (ref 70–99)
Glucose-Capillary: 209 mg/dL — ABNORMAL HIGH (ref 70–99)
Glucose-Capillary: 212 mg/dL — ABNORMAL HIGH (ref 70–99)
Glucose-Capillary: 269 mg/dL — ABNORMAL HIGH (ref 70–99)

## 2023-05-21 LAB — CBC WITH DIFFERENTIAL/PLATELET
Abs Immature Granulocytes: 0.08 10*3/uL — ABNORMAL HIGH (ref 0.00–0.07)
Basophils Absolute: 0.1 10*3/uL (ref 0.0–0.1)
Basophils Relative: 1 %
Eosinophils Absolute: 0.5 10*3/uL (ref 0.0–0.5)
Eosinophils Relative: 5 %
HCT: 24.3 % — ABNORMAL LOW (ref 39.0–52.0)
Hemoglobin: 8.1 g/dL — ABNORMAL LOW (ref 13.0–17.0)
Immature Granulocytes: 1 %
Lymphocytes Relative: 25 %
Lymphs Abs: 2.3 10*3/uL (ref 0.7–4.0)
MCH: 28.3 pg (ref 26.0–34.0)
MCHC: 33.3 g/dL (ref 30.0–36.0)
MCV: 85 fL (ref 80.0–100.0)
Monocytes Absolute: 0.6 10*3/uL (ref 0.1–1.0)
Monocytes Relative: 7 %
Neutro Abs: 5.7 10*3/uL (ref 1.7–7.7)
Neutrophils Relative %: 61 %
Platelets: 451 10*3/uL — ABNORMAL HIGH (ref 150–400)
RBC: 2.86 MIL/uL — ABNORMAL LOW (ref 4.22–5.81)
RDW: 14.4 % (ref 11.5–15.5)
WBC: 9.2 10*3/uL (ref 4.0–10.5)
nRBC: 0 % (ref 0.0–0.2)

## 2023-05-21 LAB — BASIC METABOLIC PANEL
Anion gap: 6 (ref 5–15)
BUN: 15 mg/dL (ref 6–20)
CO2: 28 mmol/L (ref 22–32)
Calcium: 8.3 mg/dL — ABNORMAL LOW (ref 8.9–10.3)
Chloride: 103 mmol/L (ref 98–111)
Creatinine, Ser: 0.83 mg/dL (ref 0.61–1.24)
GFR, Estimated: >60 mL/min (ref >60)
Glucose, Bld: 159 mg/dL — ABNORMAL HIGH (ref 70–99)
Potassium: 4.2 mmol/L (ref 3.5–5.1)
Sodium: 137 mmol/L (ref 135–145)

## 2023-05-21 NOTE — Plan of Care (Signed)
  Problem: Education: Goal: Ability to describe self-care measures that may prevent or decrease complications (Diabetes Survival Skills Education) will improve Outcome: Progressing Goal: Individualized Educational Video(s) Outcome: Progressing   Problem: Coping: Goal: Ability to adjust to condition or change in health will improve Outcome: Progressing   Problem: Fluid Volume: Goal: Ability to maintain a balanced intake and output will improve Outcome: Progressing   Problem: Health Behavior/Discharge Planning: Goal: Ability to identify and utilize available resources and services will improve Outcome: Progressing Goal: Ability to manage health-related needs will improve Outcome: Progressing   Problem: Metabolic: Goal: Ability to maintain appropriate glucose levels will improve Outcome: Progressing   Problem: Nutritional: Goal: Maintenance of adequate nutrition will improve Outcome: Progressing Goal: Progress toward achieving an optimal weight will improve Outcome: Progressing   Problem: Skin Integrity: Goal: Risk for impaired skin integrity will decrease Outcome: Progressing   Problem: Tissue Perfusion: Goal: Adequacy of tissue perfusion will improve Outcome: Progressing   Problem: Fluid Volume: Goal: Hemodynamic stability will improve Outcome: Progressing   Problem: Clinical Measurements: Goal: Diagnostic test results will improve Outcome: Progressing Goal: Signs and symptoms of infection will decrease Outcome: Progressing   Problem: Respiratory: Goal: Ability to maintain adequate ventilation will improve Outcome: Progressing   Problem: Education: Goal: Knowledge of General Education information will improve Description: Including pain rating scale, medication(s)/side effects and non-pharmacologic comfort measures Outcome: Progressing   Problem: Health Behavior/Discharge Planning: Goal: Ability to manage health-related needs will improve Outcome:  Progressing   Problem: Clinical Measurements: Goal: Ability to maintain clinical measurements within normal limits will improve Outcome: Progressing Goal: Will remain free from infection Outcome: Progressing Goal: Diagnostic test results will improve Outcome: Progressing Goal: Respiratory complications will improve Outcome: Progressing Goal: Cardiovascular complication will be avoided Outcome: Progressing   Problem: Activity: Goal: Risk for activity intolerance will decrease Outcome: Progressing   Problem: Nutrition: Goal: Adequate nutrition will be maintained Outcome: Progressing   Problem: Coping: Goal: Level of anxiety will decrease Outcome: Progressing   Problem: Elimination: Goal: Will not experience complications related to bowel motility Outcome: Progressing Goal: Will not experience complications related to urinary retention Outcome: Progressing   Problem: Pain Management: Goal: General experience of comfort will improve Outcome: Progressing   Problem: Safety: Goal: Ability to remain free from injury will improve Outcome: Progressing   Problem: Skin Integrity: Goal: Risk for impaired skin integrity will decrease Outcome: Progressing   Problem: Education: Goal: Understanding of CV disease, CV risk reduction, and recovery process will improve Outcome: Progressing Goal: Individualized Educational Video(s) Outcome: Progressing   Problem: Activity: Goal: Ability to return to baseline activity level will improve Outcome: Progressing   Problem: Cardiovascular: Goal: Ability to achieve and maintain adequate cardiovascular perfusion will improve Outcome: Progressing Goal: Vascular access site(s) Level 0-1 will be maintained Outcome: Progressing   Problem: Health Behavior/Discharge Planning: Goal: Ability to safely manage health-related needs after discharge will improve Outcome: Progressing

## 2023-05-21 NOTE — Progress Notes (Signed)
 Progress Note   Patient: Gerald Ellis FMW:[id number] DOB: [birth year] DOA: 05/07/2023     14 DOS: the patient was seen and examined on 05/21/2023     Brief hospital course:  HPI: Gerald Ellis is a 58 y.o. male with medical history significant for HTN, HLD, CAD s/p stent, DM with neuropathy, chronic back pain on opiates,, anxiety, chronic ulcer first right MTP, followed by podiatry (last seen 02/18/2023), a with normal resting ABI at the time and treated with topical dressings who presents to the ED on 05/07/23 with a 1 week history of blistering in the area of the wound and then over the past day he noted that his second and third toes of the right foot were turning black s/p surgery.    Consultants:  Podiatry  Vascular Surgery  Infectious disease    Procedures/Surgeries: 05/08/2023: Transmetatarsal amputation right foot - Dr Neill  05/09/2023: RLE angioplasty w/ stent to superficial femoral, popliteal, anterior tibial arteries - Dr. 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  ID following - patient received vancomycin  for 48h postop  Continue pain control including antispasmodic  Surgical interventions as above - currently s/p BKA RLE   PAD  S/p RLE angioplasty w/ stent 05/09/2023 ASA, Prasugrel , statin Discussed with vascular surgery team   Acute on chronic anemia Likely ABLA d/t multiple surgical procedures  Hemoglobin 6.8 on 05/19/2023 and received 1 unit  Continue to monitor CBC   DM2, hyperglycemia  Continue SSI + basal    Anxiety Continue BuSpar     GERD Continue PPI   Hypokalemia Monitor potassium closely   HTN CAD Continue metoprolol ,  Imdur , amlodipine  Consider ACE/ARB Continue ASA, Statin. Prasugrel    Debility, ambulatory dysfunction  HH PT/OT   R BKA stump hematoma - corrected w/ revision surgery 12/27 S/p revision 05/16/2023 Vascular surgery monitoring  Patient received 1 unit of blood transfusion on 05/19/2023   Obesity Class 1 based on BMI: Body mass index is 33.72 kg/m.  Counseled on weight loss when medically stable   DVT prophylaxis: lovenox    Central lines / invasive devices: none   Code Status: FULL CODE    TOC needs: SNF/rehab now that has BKA  Barriers to dispo / significant pending items: PT/OT as able,SNF placement    Family Communication: none at this time, pt declines call to a support person at this time     Subjective / Brief ROS:  Patient seen and examined at bedside this morning TOC still working on placement Admits to improvement in right amputation stump pain   Physical Exam Constitutional:      General: He is not in acute distress.    Appearance: He is not ill-appearing.  Cardiovascular:     Rate and Rhythm: Normal rate and regular rhythm.  Pulmonary:     Effort: Pulmonary effort is normal.     Breath sounds: Normal breath sounds.  Musculoskeletal:     Comments: RLE stump is wrapped, no bleeding apparent  Neurological:     General: No focal deficit present.     Mental Status: He is alert and oriented to person, place, and time.  Psychiatric:        Mood  and Affect: Mood normal.        Behavior: Behavior normal.     Data Reviewed:     Latest Ref Rng & Units 05/21/2023    3:13 AM 05/20/2023    4:01 AM 05/18/2023    4:08 AM  BMP  Glucose 70 - 99 mg/dL 840  858  874   BUN 6 - 20 mg/dL 15  15  13    Creatinine 0.61 - 1.24 mg/dL 9.16  9.15  9.21   Sodium 135 - 145 mmol/L 137  134  134   Potassium 3.5 - 5.1 mmol/L 4.2  3.8  3.9   Chloride 98 - 111 mmol/L 103  100  99   CO2 22 - 32 mmol/L 28  27  26    Calcium  8.9 - 10.3 mg/dL 8.3  8.2  8.0     Vitals:   05/20/23 0741  05/20/23 1624 05/21/23 0015 05/21/23 0718  BP: (!) 142/79 121/70 (!) 145/78 (!) 147/83  Pulse: 89 81 81 82  Resp: 18 18 20 15   Temp: 99 F (37.2 C) 98.2 F (36.8 C) 97.7 F (36.5 C) 98 F (36.7 C)  TempSrc: Oral     SpO2: 94% 96% 92% 93%  Weight:      Height:          Latest Ref Rng & Units 05/21/2023    3:13 AM 05/20/2023    4:01 AM 05/19/2023    8:25 PM  CBC  WBC 4.0 - 10.5 K/uL 9.2  12.0    Hemoglobin 13.0 - 17.0 g/dL 8.1  7.8  7.5   Hematocrit 39.0 - 52.0 % 24.3  23.3  22.2   Platelets 150 - 400 K/uL 451  427       Author: Drue ONEIDA Potter, MD 05/21/2023 4:14 PM  For on call review [email or web address].

## 2023-05-21 NOTE — Progress Notes (Signed)
 Patient states that he has not been voiding much overnight. RN checked bladder with bladder scanner and it showed >999. This RN performed I & O cath with sterile technique. Bladder drained 1000 ml of yellow urine, RN could not get any more urine to drain.  RN rechecked bladder with bladder scanner and it showed ~800 ml of urine in bladder. Patient reports he feels much relief now and reports that he does not want an indwelling foley catheter. Patient states he would like to wait a little while to see if he can urinate on his own.   Patient states he was previously urinating independently but they had to perform a cath a few days ago.

## 2023-05-21 NOTE — Plan of Care (Signed)
  Problem: Coping: Goal: Ability to adjust to condition or change in health will improve Outcome: Progressing   Problem: Health Behavior/Discharge Planning: Goal: Ability to identify and utilize available resources and services will improve Outcome: Progressing Goal: Ability to manage health-related needs will improve Outcome: Progressing   Problem: Nutritional: Goal: Maintenance of adequate nutrition will improve Outcome: Progressing   Problem: Skin Integrity: Goal: Risk for impaired skin integrity will decrease Outcome: Progressing   Problem: Tissue Perfusion: Goal: Adequacy of tissue perfusion will improve Outcome: Progressing   Problem: Clinical Measurements: Goal: Signs and symptoms of infection will decrease Outcome: Progressing   Problem: Respiratory: Goal: Ability to maintain adequate ventilation will improve Outcome: Progressing   Problem: Clinical Measurements: Goal: Ability to maintain clinical measurements within normal limits will improve Outcome: Progressing Goal: Will remain free from infection Outcome: Progressing Goal: Diagnostic test results will improve Outcome: Progressing Goal: Cardiovascular complication will be avoided Outcome: Progressing

## 2023-05-21 NOTE — TOC Progression Note (Signed)
 Transition of Care [facility]) - Progression Note    Patient Details  Name: Gerald Ellis MRN: [id number] Date of Birth: 1965/06/07  [facility] CM/SW Contact  Ladene Lady, LCSW Phone Number: 05/21/2023, 12:31 PM  Clinical Narrative:   CSW shared bed offers. Sister states she spoke with andrea at [facility] who states she may can take pt. Csw has followed up with andrea.    Expected Discharge Plan: Home w Home Health Services Barriers to Discharge: Continued Medical Work up  Expected Discharge Plan and Services       Living arrangements for the past 2 months: Single Family Home                                       Social Determinants of Health (SDOH) Interventions SDOH Screenings   Food Insecurity: No Food Insecurity (05/08/2023)  Recent Concern: Food Insecurity - Food Insecurity Present (02/18/2023)   Received from [facility]  Housing: Unknown (05/08/2023)  Transportation Needs: No Transportation Needs (05/08/2023)  Utilities: Not At Risk (05/08/2023)  Recent Concern: Utilities - At Risk (02/18/2023)   Received from [facility]  Financial Resource Strain: Low Risk  (02/18/2023)   Received from [facility]  Recent Concern: Financial Resource Strain - High Risk (12/24/2022)   Received from [facility]  Physical Activity: Insufficiently Active (04/20/2019)  Social Connections: Moderately Isolated (04/20/2019)  Stress: No Stress Concern Present (04/20/2019)  Tobacco Use: Low Risk  (05/16/2023)    Readmission Risk Interventions     No data to display

## 2023-05-22 DIAGNOSIS — L089 Local infection of the skin and subcutaneous tissue, unspecified: Secondary | ICD-10-CM | POA: Diagnosis not present

## 2023-05-22 DIAGNOSIS — E11628 Type 2 diabetes mellitus with other skin complications: Secondary | ICD-10-CM | POA: Diagnosis not present

## 2023-05-22 LAB — CBC WITH DIFFERENTIAL/PLATELET
Abs Immature Granulocytes: 0.08 10*3/uL — ABNORMAL HIGH (ref 0.00–0.07)
Basophils Absolute: 0.1 10*3/uL (ref 0.0–0.1)
Basophils Relative: 1 %
Eosinophils Absolute: 0.5 10*3/uL (ref 0.0–0.5)
Eosinophils Relative: 4 %
HCT: 25.9 % — ABNORMAL LOW (ref 39.0–52.0)
Hemoglobin: 8.4 g/dL — ABNORMAL LOW (ref 13.0–17.0)
Immature Granulocytes: 1 %
Lymphocytes Relative: 26 %
Lymphs Abs: 2.9 10*3/uL (ref 0.7–4.0)
MCH: 28.3 pg (ref 26.0–34.0)
MCHC: 32.4 g/dL (ref 30.0–36.0)
MCV: 87.2 fL (ref 80.0–100.0)
Monocytes Absolute: 0.7 10*3/uL (ref 0.1–1.0)
Monocytes Relative: 7 %
Neutro Abs: 6.7 10*3/uL (ref 1.7–7.7)
Neutrophils Relative %: 61 %
Platelets: 475 10*3/uL — ABNORMAL HIGH (ref 150–400)
RBC: 2.97 MIL/uL — ABNORMAL LOW (ref 4.22–5.81)
RDW: 13.9 % (ref 11.5–15.5)
WBC: 10.9 10*3/uL — ABNORMAL HIGH (ref 4.0–10.5)
nRBC: 0 % (ref 0.0–0.2)

## 2023-05-22 LAB — SURGICAL PATHOLOGY

## 2023-05-22 LAB — GLUCOSE, CAPILLARY
Glucose-Capillary: 187 mg/dL — ABNORMAL HIGH (ref 70–99)
Glucose-Capillary: 137 mg/dL — ABNORMAL HIGH (ref 70–99)
Glucose-Capillary: 134 mg/dL — ABNORMAL HIGH (ref 70–99)
Glucose-Capillary: 229 mg/dL — ABNORMAL HIGH (ref 70–99)
Glucose-Capillary: 197 mg/dL — ABNORMAL HIGH (ref 70–99)
Glucose-Capillary: 224 mg/dL — ABNORMAL HIGH (ref 70–99)

## 2023-05-22 LAB — BASIC METABOLIC PANEL
Anion gap: 9 (ref 5–15)
BUN: 14 mg/dL (ref 6–20)
CO2: 28 mmol/L (ref 22–32)
Calcium: 8.6 mg/dL — ABNORMAL LOW (ref 8.9–10.3)
Chloride: 99 mmol/L (ref 98–111)
Creatinine, Ser: 0.71 mg/dL (ref 0.61–1.24)
GFR, Estimated: >60 mL/min (ref >60)
Glucose, Bld: 147 mg/dL — ABNORMAL HIGH (ref 70–99)
Potassium: 4.1 mmol/L (ref 3.5–5.1)
Sodium: 136 mmol/L (ref 135–145)

## 2023-05-22 MED ORDER — LOSARTAN POTASSIUM 50 MG PO TABS
50.0000 mg | ORAL_TABLET | Freq: Every day | ORAL | Status: DC
Start: 2023-05-22 — End: 2023-05-26
  Administered 2023-05-22 – 2023-05-26 (×5): 50 mg via ORAL
  Filled 2023-05-22 (×5): qty 1

## 2023-05-22 NOTE — Progress Notes (Signed)
 Progress Note   Patient: Gerald Ellis FMW:[id number] DOB: [birth year] DOA: 05/07/2023     15 DOS: the patient was seen and examined on 05/22/2023   Brief hospital course:  HPI: Gerald Ellis is a 58 y.o. male with medical history significant for HTN, HLD, CAD s/p stent, DM with neuropathy, chronic back pain on opiates,, anxiety, chronic ulcer first right MTP, followed by podiatry (last seen 02/18/2023), a with normal resting ABI at the time and treated with topical dressings who presents to the ED on 05/07/23 with a 1 week history of blistering in the area of the wound and then over the past day he noted that his second and third toes of the right foot were turning black s/p surgery.     Consultants:  Podiatry  Vascular Surgery  Infectious disease    Procedures/Surgeries: 05/08/2023: Transmetatarsal amputation right foot - Dr Neill  05/09/2023: RLE angioplasty w/ stent to superficial femoral, popliteal, anterior tibial arteries - Dr. 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  Patient received vancomycin  for 48h postop  Continue pain control including antispasmodic  Surgical interventions as above - currently s/p BKA RLE Case management working on placement   PAD  S/p RLE angioplasty w/ stent 05/09/2023 Continue ASA, Prasugrel , statin Discussed with vascular surgery team   Acute on chronic anemia Likely ABLA d/t multiple surgical procedures  Hemoglobin 6.8 on 05/19/2023 and received 1 unit  Continue to monitor CBC   DM2, hyperglycemia  Continue SSI + basal    Anxiety Continue BuSpar     GERD Continue PPI   Hypokalemia Monitor potassium closely    HTN CAD Continue metoprolol , Imdur , amlodipine  and losartan  Continue ASA, Statin. Prasugrel    Debility, ambulatory dysfunction  Continue PT/OT   R BKA stump hematoma - corrected w/ revision surgery 12/27 S/p revision 05/16/2023 Vascular surgery monitoring  Patient received 1 unit of blood transfusion on 05/19/2023 Monitor CBC   Obesity Class 1 based on BMI: Body mass index is 33.72 kg/m.  Counseled on weight loss when medically stable   DVT prophylaxis: Continue lovenox     Central lines / invasive devices: none   Code Status: FULL CODE     TOC needs: SNF/rehab now that has BKA   Barriers to dispo / significant pending items: PT/OT as able,SNF placement    Family Communication: Discussed with patient's father present at bedside   Subjective / Brief ROS:  Patient seen and examined at bedside this morning TOC still working on placement Admits to improvement in right amputation stump pain   Physical Exam Constitutional:      General: He is not in acute distress.    Appearance: He is not ill-appearing.  Cardiovascular:     Rate and Rhythm: Normal rate and regular rhythm.  Pulmonary:     Effort: Pulmonary effort is normal.     Breath sounds: Normal breath sounds.  Musculoskeletal:     Comments: RLE stump is wrapped, no bleeding apparent  Neurological:     General: No focal deficit present.     Mental Status: He is alert and oriented to person, place, and time.  Psychiatric:        Mood and  Affect: Mood normal.        Behavior: Behavior normal.     Data Reviewed:    Latest Ref Rng & Units 05/22/2023    3:39 AM 05/21/2023    3:13 AM 05/20/2023    4:01 AM  BMP  Glucose 70 - 99 mg/dL 852  840  858   BUN 6 - 20 mg/dL 14  15  15    Creatinine 0.61 - 1.24 mg/dL 9.28  9.16  9.15   Sodium 135 - 145 mmol/L 136  137  134   Potassium 3.5 - 5.1 mmol/L 4.1  4.2  3.8   Chloride 98 - 111 mmol/L 99  103  100   CO2 22 - 32 mmol/L 28  28  27    Calcium  8.9 - 10.3 mg/dL 8.6  8.3   8.2      Vitals:   05/21/23 1628 05/22/23 0120 05/22/23 0801 05/22/23 1507  BP: (!) 141/81 (!) 145/81 (!) 141/87 (!) 155/73  Pulse: 89 87 87 97  Resp: 16 20 17 18   Temp: 99.3 F (37.4 C) 98.5 F (36.9 C)    TempSrc: Oral     SpO2: 95% 98% 98% 100%  Weight:      Height:          Latest Ref Rng & Units 05/22/2023    3:39 AM 05/21/2023    3:13 AM 05/20/2023    4:01 AM  CBC  WBC 4.0 - 10.5 K/uL 10.9  9.2  12.0   Hemoglobin 13.0 - 17.0 g/dL 8.4  8.1  7.8   Hematocrit 39.0 - 52.0 % 25.9  24.3  23.3   Platelets 150 - 400 K/uL 475  451  427      Author: Drue ONEIDA Potter, MD 05/22/2023 3:45 PM  For on call review [email or web address].

## 2023-05-22 NOTE — Plan of Care (Signed)
  Problem: Education: Goal: Ability to describe self-care measures that may prevent or decrease complications (Diabetes Survival Skills Education) will improve Outcome: Progressing Goal: Individualized Educational Video(s) Outcome: Progressing   Problem: Coping: Goal: Ability to adjust to condition or change in health will improve Outcome: Progressing   Problem: Fluid Volume: Goal: Ability to maintain a balanced intake and output will improve Outcome: Progressing   Problem: Health Behavior/Discharge Planning: Goal: Ability to identify and utilize available resources and services will improve Outcome: Progressing Goal: Ability to manage health-related needs will improve Outcome: Progressing   Problem: Metabolic: Goal: Ability to maintain appropriate glucose levels will improve Outcome: Progressing   Problem: Nutritional: Goal: Maintenance of adequate nutrition will improve Outcome: Progressing Goal: Progress toward achieving an optimal weight will improve Outcome: Progressing   Problem: Skin Integrity: Goal: Risk for impaired skin integrity will decrease Outcome: Progressing   Problem: Tissue Perfusion: Goal: Adequacy of tissue perfusion will improve Outcome: Progressing   Problem: Fluid Volume: Goal: Hemodynamic stability will improve Outcome: Progressing   Problem: Clinical Measurements: Goal: Diagnostic test results will improve Outcome: Progressing Goal: Signs and symptoms of infection will decrease Outcome: Progressing   Problem: Respiratory: Goal: Ability to maintain adequate ventilation will improve Outcome: Progressing   Problem: Education: Goal: Knowledge of General Education information will improve Description: Including pain rating scale, medication(s)/side effects and non-pharmacologic comfort measures Outcome: Progressing   Problem: Health Behavior/Discharge Planning: Goal: Ability to manage health-related needs will improve Outcome:  Progressing   Problem: Clinical Measurements: Goal: Ability to maintain clinical measurements within normal limits will improve Outcome: Progressing Goal: Will remain free from infection Outcome: Progressing Goal: Diagnostic test results will improve Outcome: Progressing Goal: Respiratory complications will improve Outcome: Progressing Goal: Cardiovascular complication will be avoided Outcome: Progressing   Problem: Activity: Goal: Risk for activity intolerance will decrease Outcome: Progressing   Problem: Nutrition: Goal: Adequate nutrition will be maintained Outcome: Progressing   Problem: Coping: Goal: Level of anxiety will decrease Outcome: Progressing   Problem: Elimination: Goal: Will not experience complications related to bowel motility Outcome: Progressing Goal: Will not experience complications related to urinary retention Outcome: Progressing   Problem: Pain Management: Goal: General experience of comfort will improve Outcome: Progressing   Problem: Safety: Goal: Ability to remain free from injury will improve Outcome: Progressing   Problem: Skin Integrity: Goal: Risk for impaired skin integrity will decrease Outcome: Progressing   Problem: Education: Goal: Understanding of CV disease, CV risk reduction, and recovery process will improve Outcome: Progressing Goal: Individualized Educational Video(s) Outcome: Progressing   Problem: Activity: Goal: Ability to return to baseline activity level will improve Outcome: Progressing   Problem: Cardiovascular: Goal: Ability to achieve and maintain adequate cardiovascular perfusion will improve Outcome: Progressing Goal: Vascular access site(s) Level 0-1 will be maintained Outcome: Progressing   Problem: Health Behavior/Discharge Planning: Goal: Ability to safely manage health-related needs after discharge will improve Outcome: Progressing

## 2023-05-22 NOTE — TOC Progression Note (Signed)
 Transition of Care [facility]) - Progression Note    Patient Details  Name: Gerald Ellis MRN: [id number] Date of Birth: 1965-11-20  [facility]) CM/SW Contact  Royanne JINNY Bernheim, RN Phone Number: 05/22/2023, 12:46 PM  Clinical Narrative:    Reached out to andrea at [facility], she is currently out of the office until Monday Jan 6th The family does not want [facility], that is the only bed offer at this time  Expected Discharge Plan: Home w Home Health Services Barriers to Discharge: Continued Medical Work up  Expected Discharge Plan and Services       Living arrangements for the past 2 months: Single Family Home                                       Social Determinants of Health (SDOH) Interventions SDOH Screenings   Food Insecurity: No Food Insecurity (05/08/2023)  Recent Concern: Food Insecurity - Food Insecurity Present (02/18/2023)   Received from [facility]  Housing: Unknown (05/08/2023)  Transportation Needs: No Transportation Needs (05/08/2023)  Utilities: Not At Risk (05/08/2023)  Recent Concern: Utilities - At Risk (02/18/2023)   Received from [facility]  Financial Resource Strain: Low Risk  (02/18/2023)   Received from [facility]  Recent Concern: Financial Resource Strain - High Risk (12/24/2022)   Received from [facility]  Physical Activity: Insufficiently Active (04/20/2019)  Social Connections: Moderately Isolated (04/20/2019)  Stress: No Stress Concern Present (04/20/2019)  Tobacco Use: Low Risk  (05/16/2023)    Readmission Risk Interventions     No data to display

## 2023-05-22 NOTE — Progress Notes (Signed)
 Occupational Therapy Treatment Patient Details Name: Gerald Ellis MRN: [id number] DOB: 02-24-1966 Today's Date: 05/22/2023   History of present illness 58 y.o. male with medical history significant for HTN, HLD, CAD s/p stent, DM with neuropathy, chronic back pain on opiates, anxiety, chronic ulcer first right MTP, followed by podiatry (last seen 02/18/2023), a with normal resting ABI at the time and treated with topical dressings who presents to the ED with a 1 week history of blistering in the area of the wound and then over the past day he noted that his second and third toes of the right foot were turning black. 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 NWB to RLE, s/p I&D 12/23 to RLE.  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  S/p 05/16/23 BKA stump washout/revision (d/t hematoma).   OT comments  Gerald Ellis was seen for OT treatment on this date. Upon arrival to room pt in bed reports R phantom limb pain but agreeable to tx. Pt requires CGA + RW for toilet t/f and standing grooming tasks with single UE support. Pt turning towards R doorway during conversation with family in room (attempts to place R phantom foot down) and pt with total LOB requiring assistance to lower safely to the ground - no impact to L knee or residual limb. Pt seated on bathroom floor performed L+R lateral leans no assist to place hoyer sling under bottom and lift utilized to return pt to bed. Discussed further falls prevention strategies and HEP with pt and famiy. Pt making progress toward goals, will continue to follow POC. Discharge recommendation remains appropriate.       If plan is discharge home, recommend the following:  A little help with walking and/or transfers;A little help with bathing/dressing/bathroom;Assistance with cooking/housework;Assist for transportation;Help with stairs or ramp for entrance   Equipment Recommendations  BSC/3in1    Recommendations for Other Services      Precautions / Restrictions  Precautions Precautions: Fall Restrictions Weight Bearing Restrictions Per Provider Order: Yes RLE Weight Bearing Per Provider Order: Non weight bearing       Mobility Bed Mobility Overal bed mobility: Modified Independent                  Transfers Overall transfer level: Needs assistance Equipment used: Rolling walker (2 wheels) Transfers: Sit to/from Stand Sit to Stand: Contact guard assist                 Balance Overall balance assessment: Needs assistance Sitting-balance support: No upper extremity supported, Feet unsupported, Feet supported Sitting balance-Leahy Scale: Normal     Standing balance support: Single extremity supported, During functional activity Standing balance-Leahy Scale: Fair                             ADL either performed or assessed with clinical judgement   ADL Overall ADL's : Needs assistance/impaired                                       General ADL Comments: CGA + RW for toilet t/f and standign grooming tasks      Cognition Arousal: Alert Behavior During Therapy: WFL for tasks assessed/performed Overall Cognitive Status: Within Functional Limits for tasks assessed  Exercises Other Exercises Other Exercises: reviewed HEP            Pertinent Vitals/ Pain       Pain Assessment Pain Assessment: Faces Faces Pain Scale: Hurts little more Pain Location: phantom limb pain at start of session Pain Descriptors / Indicators: Aching, Throbbing, Spasm, Grimacing, Guarding Pain Intervention(s): Premedicated before session   Frequency  Min 1X/week        Progress Toward Goals  OT Goals(current goals can now be found in the care plan section)  Progress towards OT goals: Progressing toward goals  ADL Goals Pt Will Perform Lower Body Dressing: with modified independence;sitting/lateral leans Pt Will Transfer to Toilet: with  modified independence;ambulating Pt Will Perform Toileting - Clothing Manipulation and hygiene: with modified independence;sitting/lateral leans Additional ADL Goal #1: Pt will complete ADL/mobility tasks while maintaining WBing precautions throughout and no VC needed to utilize, 3/3 opportunities.  Plan      Co-evaluation                 AM-PAC OT 6 Clicks Daily Activity     Outcome Measure   Help from another person eating meals?: None Help from another person taking care of personal grooming?: A Little Help from another person toileting, which includes using toliet, bedpan, or urinal?: A Little Help from another person bathing (including washing, rinsing, drying)?: A Little Help from another person to put on and taking off regular upper body clothing?: None Help from another person to put on and taking off regular lower body clothing?: A Little 6 Click Score: 20    End of Session Equipment Utilized During Treatment: Rolling walker (2 wheels)  OT Visit Diagnosis: Other abnormalities of gait and mobility (R26.89);Pain Pain - Right/Left: Right Pain - part of body: Leg   Activity Tolerance Patient tolerated treatment well   Patient Left in bed;with call bell/phone within reach;with family/visitor present   Nurse Communication Mobility status        Time: 1447-1520 OT Time Calculation (min): 33 min  Charges: OT General Charges $OT Visit: 1 Visit OT Treatments $Self Care/Home Management : 23-37 mins  Elston Slot, M.S. OTR/L  05/22/23, 4:14 PM  ascom [phone]

## 2023-05-23 DIAGNOSIS — M6281 Muscle weakness (generalized): Secondary | ICD-10-CM | POA: Diagnosis not present

## 2023-05-23 DIAGNOSIS — Z794 Long term (current) use of insulin: Secondary | ICD-10-CM

## 2023-05-23 DIAGNOSIS — E1165 Type 2 diabetes mellitus with hyperglycemia: Secondary | ICD-10-CM

## 2023-05-23 DIAGNOSIS — Z89511 Acquired absence of right leg below knee: Secondary | ICD-10-CM | POA: Diagnosis not present

## 2023-05-23 DIAGNOSIS — F419 Anxiety disorder, unspecified: Secondary | ICD-10-CM | POA: Diagnosis not present

## 2023-05-23 DIAGNOSIS — N401 Enlarged prostate with lower urinary tract symptoms: Secondary | ICD-10-CM

## 2023-05-23 DIAGNOSIS — I10 Essential (primary) hypertension: Secondary | ICD-10-CM

## 2023-05-23 DIAGNOSIS — J4540 Moderate persistent asthma, uncomplicated: Secondary | ICD-10-CM | POA: Diagnosis not present

## 2023-05-23 DIAGNOSIS — E11628 Type 2 diabetes mellitus with other skin complications: Secondary | ICD-10-CM | POA: Diagnosis not present

## 2023-05-23 DIAGNOSIS — G8929 Other chronic pain: Secondary | ICD-10-CM

## 2023-05-23 DIAGNOSIS — M549 Dorsalgia, unspecified: Secondary | ICD-10-CM

## 2023-05-23 DIAGNOSIS — R2689 Other abnormalities of gait and mobility: Secondary | ICD-10-CM | POA: Diagnosis not present

## 2023-05-23 DIAGNOSIS — N138 Other obstructive and reflux uropathy: Secondary | ICD-10-CM

## 2023-05-23 LAB — GLUCOSE, CAPILLARY
Glucose-Capillary: 100 mg/dL — ABNORMAL HIGH (ref 70–99)
Glucose-Capillary: 109 mg/dL — ABNORMAL HIGH (ref 70–99)
Glucose-Capillary: 167 mg/dL — ABNORMAL HIGH (ref 70–99)
Glucose-Capillary: 186 mg/dL — ABNORMAL HIGH (ref 70–99)
Glucose-Capillary: 210 mg/dL — ABNORMAL HIGH (ref 70–99)
Glucose-Capillary: 239 mg/dL — ABNORMAL HIGH (ref 70–99)
Glucose-Capillary: 286 mg/dL — ABNORMAL HIGH (ref 70–99)

## 2023-05-23 NOTE — Progress Notes (Signed)
 Physical Therapy Treatment Patient Details Name: Gerald Ellis MRN: [id number] DOB: 06/25/1965 Today's Date: 05/23/2023   History of Present Illness 58 y.o. male with medical history significant for HTN, HLD, CAD s/p stent, DM with neuropathy, chronic back pain on opiates, anxiety, chronic ulcer first right MTP, followed by podiatry (last seen 02/18/2023), a with normal resting ABI at the time and treated with topical dressings who presents to the ED with a 1 week history of blistering in the area of the wound and then over the past day he noted that his second and third toes of the right foot were turning black. 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 NWB to RLE, s/p I&D 12/23 to RLE.  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  S/p 05/16/23 BKA stump washout/revision (d/t hematoma).    PT Comments  Pt received in bed and educated on importance of increasing mobility and OOB activity. Focused session on w/c mobility in hallway ~438ft using B Ue's to propel around objects with SBA and cues for turning and overall w/c management with good carry-over. Pt continues to express R LE phantom limb pain, educated on desensitization techniques and proper positioning to prevent contractures. Pt awaiting STR pacement. Continue PT per POC.   If plan is discharge home, recommend the following: A little help with walking and/or transfers;A little help with bathing/dressing/bathroom;Assistance with cooking/housework;Assist for transportation;Help with stairs or ramp for entrance   Can travel by private vehicle        Equipment Recommendations  Rolling walker (2 wheels);BSC/3in1;Wheelchair (measurements PT);Wheelchair cushion (measurements PT)    Recommendations for Other Services       Precautions / Restrictions Precautions Precautions: Fall Restrictions Weight Bearing Restrictions Per Provider Order: Yes RLE Weight Bearing Per Provider Order: Non weight bearing Other Position/Activity Restrictions: R BKA      Mobility  Bed Mobility Overal bed mobility: Modified Independent                  Transfers Overall transfer level: Needs assistance Equipment used: Rolling walker (2 wheels) Transfers: Sit to/from Stand Sit to Stand: Contact guard assist                Ambulation/Gait Ambulation/Gait assistance: Contact guard assist Gait Distance (Feet): 5 Feet Assistive device: Rolling walker (2 wheels) Gait Pattern/deviations:  (Hop to gait pattern) Gait velocity: dec     General Gait Details: No dizziness with positional changes   Psychologist, Counselling mobility: Yes Wheelchair propulsion: Both upper extremities Wheelchair parts: Supervision/cueing Distance: 400 Wheelchair Assistance Details (indicate cue type and reason): Pt educated on proper use of w/c, good demonstration for distance tolerance using B UE's to propel. Pt able to maneuver around objects and back out of room without difficulty   Tilt Bed    Modified Rankin (Stroke Patients Only)       Balance Overall balance assessment: Needs assistance Sitting-balance support: No upper extremity supported, Feet unsupported, Feet supported Sitting balance-Leahy Scale: Normal Sitting balance - Comments: steady reaching outside BOS   Standing balance support: During functional activity, Bilateral upper extremity supported, Reliant on assistive device for balance Standing balance-Leahy Scale: Fair                              Cognition Arousal: Alert Behavior During Therapy: WFL for tasks assessed/performed Overall Cognitive Status: Within Functional Limits for tasks assessed  Exercises Other Exercises Other Exercises: Pt educated on desensitization exercises to assist with decreasing current phantom limb pain. Other Exercises: Pt educated in residual limb positioning, importance of  maintaining knee extension, and falls prevention    General Comments General comments (skin integrity, edema, etc.): R BKA dressing intact without visible drainage      Pertinent Vitals/Pain Pain Assessment Pain Assessment: 0-10 Pain Score: 7  Pain Location: phantom limb pain at start of session Pain Descriptors / Indicators: Aching, Throbbing, Spasm, Grimacing, Guarding Pain Intervention(s): Monitored during session, Patient requesting pain meds-RN notified    Home Living                          Prior Function            PT Goals (current goals can now be found in the care plan section) Acute Rehab PT Goals Patient Stated Goal: improve pain and walking    Frequency    Min 1X/week      PT Plan      Co-evaluation              AM-PAC PT 6 Clicks Mobility   Outcome Measure  Help needed turning from your back to your side while in a flat bed without using bedrails?: None Help needed moving from lying on your back to sitting on the side of a flat bed without using bedrails?: None Help needed moving to and from a bed to a chair (including a wheelchair)?: A Little Help needed standing up from a chair using your arms (e.g., wheelchair or bedside chair)?: A Little Help needed to walk in hospital room?: A Little Help needed climbing 3-5 steps with a railing? : A Lot 6 Click Score: 19    End of Session Equipment Utilized During Treatment: Gait belt Activity Tolerance: Patient tolerated treatment well Patient left: in chair;with nursing/sitter in room;with family/visitor present;with call bell/phone within reach Nurse Communication: Patient requests pain meds;Precautions;Weight bearing status PT Visit Diagnosis: Muscle weakness (generalized) (M62.81);Difficulty in walking, not elsewhere classified (R26.2);Other abnormalities of gait and mobility (R26.89)     Time: 8496-8472 PT Time Calculation (min) (ACUTE ONLY): 24 min  Charges:    $Therapeutic  Activity: 8-22 mins $Wheel Chair Management: 8-22 mins PT General Charges $$ ACUTE PT VISIT: 1 Visit                    Darice Bohr, PTA  Darice JAYSON Bohr 05/23/2023, 4:59 PM

## 2023-05-23 NOTE — Plan of Care (Signed)
  Problem: Education: Goal: Ability to describe self-care measures that may prevent or decrease complications (Diabetes Survival Skills Education) will improve Outcome: Progressing Goal: Individualized Educational Video(s) Outcome: Progressing   Problem: Coping: Goal: Ability to adjust to condition or change in health will improve Outcome: Progressing   Problem: Fluid Volume: Goal: Ability to maintain a balanced intake and output will improve Outcome: Progressing   Problem: Health Behavior/Discharge Planning: Goal: Ability to identify and utilize available resources and services will improve Outcome: Progressing Goal: Ability to manage health-related needs will improve Outcome: Progressing   Problem: Metabolic: Goal: Ability to maintain appropriate glucose levels will improve Outcome: Progressing   Problem: Nutritional: Goal: Maintenance of adequate nutrition will improve Outcome: Progressing Goal: Progress toward achieving an optimal weight will improve Outcome: Progressing   Problem: Skin Integrity: Goal: Risk for impaired skin integrity will decrease Outcome: Progressing   Problem: Tissue Perfusion: Goal: Adequacy of tissue perfusion will improve Outcome: Progressing   Problem: Fluid Volume: Goal: Hemodynamic stability will improve Outcome: Progressing   Problem: Clinical Measurements: Goal: Diagnostic test results will improve Outcome: Progressing Goal: Signs and symptoms of infection will decrease Outcome: Progressing   Problem: Respiratory: Goal: Ability to maintain adequate ventilation will improve Outcome: Progressing   Problem: Education: Goal: Knowledge of General Education information will improve Description: Including pain rating scale, medication(s)/side effects and non-pharmacologic comfort measures Outcome: Progressing   Problem: Health Behavior/Discharge Planning: Goal: Ability to manage health-related needs will improve Outcome:  Progressing   Problem: Clinical Measurements: Goal: Ability to maintain clinical measurements within normal limits will improve Outcome: Progressing Goal: Will remain free from infection Outcome: Progressing Goal: Diagnostic test results will improve Outcome: Progressing Goal: Respiratory complications will improve Outcome: Progressing Goal: Cardiovascular complication will be avoided Outcome: Progressing   Problem: Activity: Goal: Risk for activity intolerance will decrease Outcome: Progressing   Problem: Nutrition: Goal: Adequate nutrition will be maintained Outcome: Progressing   Problem: Coping: Goal: Level of anxiety will decrease Outcome: Progressing   Problem: Elimination: Goal: Will not experience complications related to bowel motility Outcome: Progressing Goal: Will not experience complications related to urinary retention Outcome: Progressing   Problem: Pain Management: Goal: General experience of comfort will improve Outcome: Progressing   Problem: Safety: Goal: Ability to remain free from injury will improve Outcome: Progressing   Problem: Skin Integrity: Goal: Risk for impaired skin integrity will decrease Outcome: Progressing   Problem: Education: Goal: Understanding of CV disease, CV risk reduction, and recovery process will improve Outcome: Progressing Goal: Individualized Educational Video(s) Outcome: Progressing   Problem: Activity: Goal: Ability to return to baseline activity level will improve Outcome: Progressing   Problem: Cardiovascular: Goal: Ability to achieve and maintain adequate cardiovascular perfusion will improve Outcome: Progressing Goal: Vascular access site(s) Level 0-1 will be maintained Outcome: Progressing   Problem: Health Behavior/Discharge Planning: Goal: Ability to safely manage health-related needs after discharge will improve Outcome: Progressing

## 2023-05-23 NOTE — Progress Notes (Signed)
 Late entry: On Jan 2, at approximately 1510 this RN was notified that patient was working with OT, lost his balance, and had to be lowered safely to the floor. No impact to residual limb or head. Family was present in the room at the time. Patient was transferred back to bed with [facility] lift. This RN notified Dr. Dorinda. Patient's vital signs stable after returning to bed.

## 2023-05-23 NOTE — TOC Progression Note (Signed)
 Transition of Care [facility]) - Progression Note    Patient Details  Name: Gerald Ellis MRN: [id number] Date of Birth: [birth year]  [facility]) CM/SW Contact  Royanne JINNY Bernheim, RN Phone Number: 05/23/2023, 12:17 PM  Clinical Narrative:    Spoke with the patient and his sister in the room, they are agreeable to [facility] and rehab at [place],  I notified Emmalene I called HTA and requested in approval to go to str Waiting on approval   Expected Discharge Plan: Home w Home Health Services Barriers to Discharge: Continued Medical Work up  Expected Discharge Plan and Services       Living arrangements for the past 2 months: Single Family Home                                       Social Determinants of Health (SDOH) Interventions SDOH Screenings   Food Insecurity: No Food Insecurity (05/08/2023)  Recent Concern: Food Insecurity - Food Insecurity Present (02/18/2023)   Received from [facility]  Housing: Unknown (05/08/2023)  Transportation Needs: No Transportation Needs (05/08/2023)  Utilities: Not At Risk (05/08/2023)  Recent Concern: Utilities - At Risk (02/18/2023)   Received from [facility]  Financial Resource Strain: Low Risk  (02/18/2023)   Received from [facility]  Recent Concern: Financial Resource Strain - High Risk (12/24/2022)   Received from [facility]  Physical Activity: Insufficiently Active (04/20/2019)  Social Connections: Moderately Isolated (04/20/2019)  Stress: No Stress Concern Present (04/20/2019)  Tobacco Use: Low Risk  (05/16/2023)    Readmission Risk Interventions     No data to display

## 2023-05-23 NOTE — Progress Notes (Signed)
 Progress Note   Patient: Gerald Ellis FMW:[id number] DOB: 05-Sep-1965 DOA: 05/07/2023     16 DOS: the patient was seen and examined on 05/23/2023   Brief hospital course:  HPI: Gerald Ellis is a 58 y.o. male with medical history significant for HTN, HLD, CAD s/p stent, DM with neuropathy, chronic back pain on opiates,, anxiety, chronic ulcer first right MTP, followed by podiatry (last seen 02/18/2023), a with normal resting ABI at the time and treated with topical dressings who presents to the ED on 05/07/23 with a 1 week history of blistering in the area of the wound and then over the past day he noted that his second and third toes of the right foot were turning black s/p surgery.  1/3: Patient underwent multiple surgeries including initial TMA, angioplasty with stent placement to right lower extremity and eventually right BKA followed by 1 revision. Currently stable-had 1 bed offer-pending insurance authorization for placement.  Patient lowered down with the help of OT and nursing staff when he lost his balance today while working with PT, no injuries.     Consultants:  Podiatry  Vascular Surgery  Infectious disease    Procedures/Surgeries: 05/08/2023: Transmetatarsal amputation right foot - Dr Neill  05/09/2023: RLE angioplasty w/ stent to superficial femoral, popliteal, anterior tibial arteries - Dr. 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  Patient received vancomycin  for 48h postop  Continue pain control including antispasmodic  Surgical interventions as above - currently s/p BKA RLE Case management working on placement   PAD  S/p RLE angioplasty w/ stent  05/09/2023 Continue ASA, Prasugrel , statin Discussed with vascular surgery team   Acute on chronic anemia Likely ABLA d/t multiple surgical procedures  Hemoglobin 6.8 on 05/19/2023 and received 1 unit  Continue to monitor CBC   DM2, hyperglycemia  Continue SSI + basal    Anxiety Continue BuSpar     GERD Continue PPI   Hypokalemia Monitor potassium closely   HTN CAD Continue metoprolol , Imdur , amlodipine  and losartan  Continue ASA, Statin. Prasugrel    Debility, ambulatory dysfunction  Continue PT/OT   R BKA stump hematoma - corrected w/ revision surgery 12/27 S/p revision 05/16/2023 Vascular surgery monitoring  Patient received 1 unit of blood transfusion on 05/19/2023 Monitor CBC   Obesity Class 1 based on BMI: Body mass index is 33.72 kg/m.  Counseled on weight loss when medically stable   DVT prophylaxis: Continue lovenox     Central lines / invasive devices: none   Code Status: FULL CODE     TOC needs: SNF/rehab now that has BKA   Barriers to dispo / significant pending items: PT/OT as able,SNF placement    Family Communication: Discussed with patient   Subjective / Brief ROS:  Patient was seen and examined today.  No new concern.  Awaiting placement.   Physical Exam General.  Well-developed gentleman, in no acute distress. Pulmonary.  Lungs clear bilaterally, normal respiratory effort. CV.  Regular rate and rhythm, no JVD, rub or murmur. Abdomen.  Soft, nontender, nondistended, BS positive. CNS.  Alert and oriented .  No focal neurologic deficit. Extremities.  Right BKA, no edema on left Psychiatry.  Judgment and insight appears normal.  Data Reviewed:    Latest Ref Rng & Units 05/22/2023    3:39 AM 05/21/2023    3:13 AM 05/20/2023    4:01 AM  BMP  Glucose 70 - 99 mg/dL 852  840  858   BUN 6 - 20 mg/dL 14  15  15    Creatinine 0.61 - 1.24 mg/dL 9.28  9.16  9.15   Sodium 135 - 145 mmol/L 136  137  134   Potassium 3.5 - 5.1 mmol/L 4.1  4.2  3.8    Chloride 98 - 111 mmol/L 99  103  100   CO2 22 - 32 mmol/L 28  28  27    Calcium  8.9 - 10.3 mg/dL 8.6  8.3  8.2      Vitals:   05/22/23 0801 05/22/23 1507 05/22/23 2314 05/23/23 0801  BP: (!) 141/87 (!) 155/73 130/72 136/89  Pulse: 87 97 88 90  Resp: 17 18  18   Temp:   98.3 F (36.8 C) 97.8 F (36.6 C)  TempSrc:    Oral  SpO2: 98% 100% 94% 99%  Weight:      Height:          Latest Ref Rng & Units 05/22/2023    3:39 AM 05/21/2023    3:13 AM 05/20/2023    4:01 AM  CBC  WBC 4.0 - 10.5 K/uL 10.9  9.2  12.0   Hemoglobin 13.0 - 17.0 g/dL 8.4  8.1  7.8   Hematocrit 39.0 - 52.0 % 25.9  24.3  23.3   Platelets 150 - 400 K/uL 475  451  427      Author: Amaryllis Dare, MD 05/23/2023 2:06 PM  For on call review [email or web address].

## 2023-05-24 DIAGNOSIS — N401 Enlarged prostate with lower urinary tract symptoms: Secondary | ICD-10-CM | POA: Diagnosis not present

## 2023-05-24 DIAGNOSIS — F419 Anxiety disorder, unspecified: Secondary | ICD-10-CM | POA: Diagnosis not present

## 2023-05-24 DIAGNOSIS — E11628 Type 2 diabetes mellitus with other skin complications: Secondary | ICD-10-CM | POA: Diagnosis not present

## 2023-05-24 DIAGNOSIS — I10 Essential (primary) hypertension: Secondary | ICD-10-CM | POA: Diagnosis not present

## 2023-05-24 LAB — GLUCOSE, CAPILLARY
Glucose-Capillary: 139 mg/dL — ABNORMAL HIGH (ref 70–99)
Glucose-Capillary: 162 mg/dL — ABNORMAL HIGH (ref 70–99)
Glucose-Capillary: 264 mg/dL — ABNORMAL HIGH (ref 70–99)
Glucose-Capillary: 282 mg/dL — ABNORMAL HIGH (ref 70–99)
Glucose-Capillary: 242 mg/dL — ABNORMAL HIGH (ref 70–99)
Glucose-Capillary: 323 mg/dL — ABNORMAL HIGH (ref 70–99)

## 2023-05-24 MED ORDER — GABAPENTIN 300 MG PO CAPS
300.0000 mg | ORAL_CAPSULE | Freq: Two times a day (BID) | ORAL | Status: DC
  Administered 2023-05-24 – 2023-05-26 (×5): 300 mg via ORAL
  Filled 2023-05-24 (×5): qty 1

## 2023-05-24 NOTE — TOC Progression Note (Signed)
 Transition of Care [facility]) - Progression Note    Patient Details  Name: Gerald Ellis MRN: [id number] Date of Birth: [birth year]  Transition of Care [facility]) CM/SW Contact  Racheal LITTIE Schimke, RN Phone Number: 05/24/2023, 2:17 PM  Clinical Narrative:  HTA, Auth. Submitted via phone to [facility]. HTA to follow up when decision is made.    Expected Discharge Plan: Home w Home Health Services Barriers to Discharge: Continued Medical Work up  Expected Discharge Plan and Services       Living arrangements for the past 2 months: Single Family Home                                       Social Determinants of Health (SDOH) Interventions SDOH Screenings   Food Insecurity: No Food Insecurity (05/08/2023)  Recent Concern: Food Insecurity - Food Insecurity Present (02/18/2023)   Received from [facility]  Housing: Unknown (05/08/2023)  Transportation Needs: No Transportation Needs (05/08/2023)  Utilities: Not At Risk (05/08/2023)  Recent Concern: Utilities - At Risk (02/18/2023)   Received from [facility]  Financial Resource Strain: Low Risk  (02/18/2023)   Received from [facility]  Recent Concern: Financial Resource Strain - High Risk (12/24/2022)   [facility]  Physical Activity: Insufficiently Active (04/20/2019)  Social Connections: Moderately Isolated (04/20/2019)  Stress: No Stress Concern Present (04/20/2019)  Tobacco Use: Low Risk  (05/16/2023)    Readmission Risk Interventions     No data to display

## 2023-05-24 NOTE — Progress Notes (Signed)
 Progress Note   Patient: Gerald Ellis FMW:[id number] DOB: February 15, 1966 DOA: 05/07/2023     17 DOS: the patient was seen and examined on 05/24/2023   Brief hospital course:  HPI: Gerald Ellis is a 58 y.o. male with medical history significant for HTN, HLD, CAD s/p stent, DM with neuropathy, chronic back pain on opiates,, anxiety, chronic ulcer first right MTP, followed by podiatry (last seen 02/18/2023), a with normal resting ABI at the time and treated with topical dressings who presents to the ED on 05/07/23 with a 1 week history of blistering in the area of the wound and then over the past day he noted that his second and third toes of the right foot were turning black s/p surgery.  1/3: Patient underwent multiple surgeries including initial TMA, angioplasty with stent placement to right lower extremity and eventually right BKA followed by 1 revision. Currently stable-had 1 bed offer-pending insurance authorization for placement.  Patient lowered down with the help of OT and nursing staff when he lost his balance today while working with PT, no injuries.  1/4: Patient was having phantom pain-started on gabapentin . Still pending insurance authorization for rehab     Consultants:  Podiatry  Vascular Surgery  Infectious disease    Procedures/Surgeries: 05/08/2023: Transmetatarsal amputation right foot - Dr Neill  05/09/2023: RLE angioplasty w/ stent to superficial femoral, popliteal, anterior tibial arteries - Dr. 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  Patient received vancomycin  for 48h postop  Continue pain control including antispasmodic  Surgical interventions  as above - currently s/p BKA RLE Case management working on placement   PAD  S/p RLE angioplasty w/ stent 05/09/2023 Continue ASA, Prasugrel , statin Discussed with vascular surgery team   Acute on chronic anemia Likely ABLA d/t multiple surgical procedures  Hemoglobin 6.8 on 05/19/2023 and received 1 unit  Continue to monitor CBC   DM2, hyperglycemia  Continue SSI + basal    Anxiety Continue BuSpar     GERD Continue PPI   Hypokalemia Monitor potassium closely   HTN CAD Continue metoprolol , Imdur , amlodipine  and losartan  Continue ASA, Statin. Prasugrel    Debility, ambulatory dysfunction  Continue PT/OT   R BKA stump hematoma - corrected w/ revision surgery 12/27 S/p revision 05/16/2023 Vascular surgery monitoring  Patient received 1 unit of blood transfusion on 05/19/2023 Monitor CBC   Obesity Class 1 based on BMI: Body mass index is 33.72 kg/m.  Counseled on weight loss when medically stable   DVT prophylaxis: Continue lovenox     Central lines / invasive devices: none   Code Status: FULL CODE     TOC needs: SNF/rehab now that has BKA   Barriers to dispo / significant pending items: PT/OT as able,SNF placement    Family Communication: Discussed with patient   Subjective / Brief ROS:  Patient was having significant Pentam pain.  Agrees to try gabapentin .   Physical Exam General.  Well-developed gentleman, in no acute distress. Pulmonary.  Lungs clear bilaterally, normal respiratory effort. CV.  Regular rate and rhythm, no JVD, rub or murmur. Abdomen.  Soft, nontender, nondistended, BS positive. CNS.  Alert and oriented .  No focal neurologic deficit. Extremities.  Right  BKA Psychiatry.  Judgment and insight appears normal.     Data Reviewed:    Latest Ref Rng & Units 05/22/2023    3:39 AM 05/21/2023    3:13 AM 05/20/2023    4:01 AM  BMP  Glucose 70 - 99 mg/dL 852  840  858   BUN 6 - 20 mg/dL 14  15  15    Creatinine 0.61 - 1.24 mg/dL 9.28  9.16   9.15   Sodium 135 - 145 mmol/L 136  137  134   Potassium 3.5 - 5.1 mmol/L 4.1  4.2  3.8   Chloride 98 - 111 mmol/L 99  103  100   CO2 22 - 32 mmol/L 28  28  27    Calcium  8.9 - 10.3 mg/dL 8.6  8.3  8.2      Vitals:   05/23/23 0801 05/23/23 1533 05/23/23 2010 05/24/23 0721  BP: 136/89 110/62 (!) 145/72 134/83  Pulse: 90 89 91 85  Resp: 18 16 20 18   Temp: 97.8 F (36.6 C) 98.2 F (36.8 C) 99.3 F (37.4 C) 98.4 F (36.9 C)  TempSrc: Oral Oral Oral   SpO2: 99% 96% 95% 96%  Weight:      Height:          Latest Ref Rng & Units 05/22/2023    3:39 AM 05/21/2023    3:13 AM 05/20/2023    4:01 AM  CBC  WBC 4.0 - 10.5 K/uL 10.9  9.2  12.0   Hemoglobin 13.0 - 17.0 g/dL 8.4  8.1  7.8   Hematocrit 39.0 - 52.0 % 25.9  24.3  23.3   Platelets 150 - 400 K/uL 475  451  427      Author: Amaryllis Dare, MD 05/24/2023 3:26 PM  For on call review [email or web address].

## 2023-05-24 NOTE — Plan of Care (Signed)

## 2023-05-24 NOTE — Progress Notes (Signed)
 OT Cancellation Note  Patient Details Name: Gerald Ellis MRN: [id number] DOB: January 10, 1966   Cancelled Treatment:     Pt with increased pain this date, will continue attempts for OT treatment sessions.    Rye Decoste T Bambi Fehnel, OTR/L, CLT Rada Zegers 05/24/2023, 3:13 PM

## 2023-05-24 NOTE — Progress Notes (Signed)
 PT Cancellation Note  Patient Details Name: Gerald Ellis MRN: [id number] DOB: September 10, 1965   Cancelled Treatment:     PT attempt 3 x this date. Pt c/o severe pain. Its been much worse all day today. The doctor is changing my medications around. Pt was endorsing incisional pain and phantom limb pain. Educated him on importance of desensitization techniques and importance of OOB activity. Ive been up to the BR 2 x already. Pt encouraged for mor OOB activity but elects not to participate at this time. Can we just start back up tomorrow?     [name] 05/24/2023, 2:22 PM

## 2023-05-25 DIAGNOSIS — E11628 Type 2 diabetes mellitus with other skin complications: Secondary | ICD-10-CM | POA: Diagnosis not present

## 2023-05-25 DIAGNOSIS — I10 Essential (primary) hypertension: Secondary | ICD-10-CM | POA: Diagnosis not present

## 2023-05-25 DIAGNOSIS — F419 Anxiety disorder, unspecified: Secondary | ICD-10-CM | POA: Diagnosis not present

## 2023-05-25 DIAGNOSIS — N401 Enlarged prostate with lower urinary tract symptoms: Secondary | ICD-10-CM | POA: Diagnosis not present

## 2023-05-25 LAB — GLUCOSE, CAPILLARY
Glucose-Capillary: 124 mg/dL — ABNORMAL HIGH (ref 70–99)
Glucose-Capillary: 138 mg/dL — ABNORMAL HIGH (ref 70–99)
Glucose-Capillary: 163 mg/dL — ABNORMAL HIGH (ref 70–99)
Glucose-Capillary: 177 mg/dL — ABNORMAL HIGH (ref 70–99)
Glucose-Capillary: 194 mg/dL — ABNORMAL HIGH (ref 70–99)
Glucose-Capillary: 236 mg/dL — ABNORMAL HIGH (ref 70–99)
Glucose-Capillary: 238 mg/dL — ABNORMAL HIGH (ref 70–99)
Glucose-Capillary: 298 mg/dL — ABNORMAL HIGH (ref 70–99)

## 2023-05-25 NOTE — Progress Notes (Signed)
 Physical Therapy Treatment Patient Details Name: Gerald Ellis MRN: [id number] DOB: [birth year] Today's Date: 05/25/2023   History of Present Illness 58 y.o. male with medical history significant for HTN, HLD, CAD s/p stent, DM with neuropathy, chronic back pain on opiates, anxiety, chronic ulcer first right MTP, followed by podiatry (last seen 02/18/2023), a with normal resting ABI at the time and treated with topical dressings who presents to the ED with a 1 week history of blistering in the area of the wound and then over the past day he noted that his second and third toes of the right foot were turning black. 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 NWB to RLE, s/p I&D 12/23 to RLE.  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  S/p 05/16/23 BKA stump washout/revision (d/t hematoma).    PT Comments  Session attempted this AM but pt lethargic.  Returned this PM and he is awake and ready to try.  Attributes it to pain meds.  He is able to transition to/from supine with supervision. Steady in sitting.  Stands to RW with cga/min a x 1 for standing AROM RLE with walker support until fatigued which is about 1 minute.  He is able to stand and walk around bed to bathroom then opts to return to bed closer to door upon finishing. Amb with RW and cga x 1 and cues to slow down upon returning to bed as he does try to hurry due to fatigue.  He does report some dizziness 3/10 after gait.  Answered questions as appropriate.   If plan is discharge home, recommend the following: A little help with walking and/or transfers;A little help with bathing/dressing/bathroom;Assistance with cooking/housework;Assist for transportation;Help with stairs or ramp for entrance   Can travel by private vehicle        Equipment Recommendations  Rolling walker (2 wheels);BSC/3in1;Wheelchair (measurements PT);Wheelchair cushion (measurements PT)    Recommendations for Other Services       Precautions / Restrictions Precautions Precautions:  Fall Restrictions Weight Bearing Restrictions Per Provider Order: Yes RLE Weight Bearing Per Provider Order: Non weight bearing Other Position/Activity Restrictions: R BKA     Mobility  Bed Mobility Overal bed mobility: Modified Independent               Patient Response: Cooperative  Transfers Overall transfer level: Needs assistance Equipment used: Rolling walker (2 wheels) Transfers: Sit to/from Stand Sit to Stand: Contact guard assist                Ambulation/Gait Ambulation/Gait assistance: Contact guard assist Gait Distance (Feet): 10 Feet Assistive device: Rolling walker (2 wheels) Gait Pattern/deviations: Step-to pattern Gait velocity: dec     General Gait Details: some dizziness upon return from bathroom today   Stairs             Wheelchair Mobility     Tilt Bed Tilt Bed Patient Response: Cooperative  Modified Rankin (Stroke Patients Only)       Balance Overall balance assessment: Needs assistance Sitting-balance support: No upper extremity supported, Feet unsupported, Feet supported Sitting balance-Leahy Scale: Normal     Standing balance support: During functional activity, Bilateral upper extremity supported, Reliant on assistive device for balance Standing balance-Leahy Scale: Fair Standing balance comment: generally does well but fatigues quickly which impacts balance/safety                            Cognition Arousal: Alert Behavior During Therapy: [facility] for tasks  assessed/performed Overall Cognitive Status: Within Functional Limits for tasks assessed                                          Exercises Other Exercises Other Exercises: standing AROM RLE Other Exercises: to bathroom.    General Comments        Pertinent Vitals/Pain Pain Assessment Pain Assessment: Faces Faces Pain Scale: Hurts little more Pain Location: phantom limb pain at start of session Pain Descriptors /  Indicators: Aching, Throbbing, Spasm, Grimacing, Guarding Pain Intervention(s): Limited activity within patient's tolerance, Monitored during session, Repositioned    Home Living                          Prior Function            PT Goals (current goals can now be found in the care plan section) Progress towards PT goals: Progressing toward goals    Frequency    Min 1X/week      PT Plan      Co-evaluation              AM-PAC PT 6 Clicks Mobility   Outcome Measure  Help needed turning from your back to your side while in a flat bed without using bedrails?: None Help needed moving from lying on your back to sitting on the side of a flat bed without using bedrails?: None Help needed moving to and from a bed to a chair (including a wheelchair)?: A Little Help needed standing up from a chair using your arms (e.g., wheelchair or bedside chair)?: A Little Help needed to walk in hospital room?: A Little Help needed climbing 3-5 steps with a railing? : A Lot 6 Click Score: 19    End of Session Equipment Utilized During Treatment: Gait belt Activity Tolerance: Patient tolerated treatment well Patient left: in bed;with call bell/phone within reach;with bed alarm set;with family/visitor present Nurse Communication: Precautions;Weight bearing status PT Visit Diagnosis: Muscle weakness (generalized) (M62.81);Difficulty in walking, not elsewhere classified (R26.2);Other abnormalities of gait and mobility (R26.89)     Time: 1337-1400 PT Time Calculation (min) (ACUTE ONLY): 23 min  Charges:    $Gait Training: 8-22 mins PT General Charges $$ ACUTE PT VISIT: 1 Visit                   Lauraine Gills, PTA 05/25/23, 2:13 PM

## 2023-05-25 NOTE — TOC Progression Note (Addendum)
 Transition of Care [facility]) - Progression Note    Patient Details  Name: Gerald Ellis MRN: [id number] Date of Birth: 12/24/1965  Transition of [facility]) CM/SW Contact  Racheal LITTIE Schimke, RN Phone Number: 05/25/2023, 8:06 AM  Clinical Narrative:  Per Ernest Marlin HOUSEMAN called with SNF- ref # [id number], Ambulance- [phone]. Will expire in five, 5 business days effective today. Will notify MD and patient. 11:35 am Discussed SNF recommendation and [facility] place with patient who is receptive. Called [facility] spoke with Darina, who advised to call [facility] on Monday for admission. MD also notified via secure chat.    Expected Discharge Plan: Home w Home Health Services Barriers to Discharge: Continued Medical Work up  Expected Discharge Plan and Services       Living arrangements for the past 2 months: Single Family Home                                       Social Determinants of Health (SDOH) Interventions SDOH Screenings   Food Insecurity: No Food Insecurity (05/08/2023)  Recent Concern: Food Insecurity - Food Insecurity Present (02/18/2023)   Received from [facility]  Housing: Unknown (05/08/2023)  Transportation Needs: No Transportation Needs (05/08/2023)  Utilities: Not At Risk (05/08/2023)  Recent Concern: Utilities - At Risk (02/18/2023)   Received from [facility]  Financial Resource Strain: Low Risk  (02/18/2023)   Received from [facility]  Recent Concern: Financial Resource Strain - High Risk (12/24/2022)   [facility] from [facility]  Physical Activity: Insufficiently Active (04/20/2019)  Social Connections: Moderately Isolated (04/20/2019)  Stress: No Stress Concern Present (04/20/2019)  Tobacco Use: Low Risk  (05/16/2023)    Readmission Risk Interventions     No data to display

## 2023-05-25 NOTE — Progress Notes (Signed)
 Progress Note   Patient: Gerald Ellis FMW:[id number] DOB: Dec 12, 1965 DOA: 05/07/2023     18 DOS: the patient was seen and examined on 05/25/2023   Brief hospital course:  HPI: Gerald Ellis is a 58 y.o. male with medical history significant for HTN, HLD, CAD s/p stent, DM with neuropathy, chronic back pain on opiates,, anxiety, chronic ulcer first right MTP, followed by podiatry (last seen 02/18/2023), a with normal resting ABI at the time and treated with topical dressings who presents to the ED on 05/07/23 with a 1 week history of blistering in the area of the wound and then over the past day he noted that his second and third toes of the right foot were turning black s/p surgery.  1/3: Patient underwent multiple surgeries including initial TMA, angioplasty with stent placement to right lower extremity and eventually right BKA followed by 1 revision. Currently stable-had 1 bed offer-pending insurance authorization for placement.  Patient lowered down with the help of OT and nursing staff when he lost his balance today while working with PT, no injuries.  1/4: Patient was having phantom pain-started on gabapentin . Still pending insurance authorization for rehab  1/5: Improvement in phantom pain after adding gabapentin .  Obtained insurance authorization for him to go to facility tomorrow.     Consultants:  Podiatry  Vascular Surgery  Infectious disease    Procedures/Surgeries: 05/08/2023: Transmetatarsal amputation right foot - Dr Neill  05/09/2023: RLE angioplasty w/ stent to superficial femoral, popliteal, anterior tibial arteries - Dr. Jama  05/12/2023: irrigation/debridement R foot, washout - Dr Neill  05/15/2023: R BKA - Dr Marea 05/16/2023: Revision R BKA - Dr Marea       ASSESSMENT & PLAN:   Gas gangrene R forefoot Diabetic foot infection (+)MRSA Sepsis d/t above  S/p Transmetatarsal amputation right foot 05/08/2023 S/p irrigation/debridement 05/12/2023  S/p R  BKA 05/15/2023  Patient received vancomycin  for 48h postop  Continue pain control including antispasmodic  Surgical interventions as above - currently s/p BKA RLE Case management working on placement   PAD  S/p RLE angioplasty w/ stent 05/09/2023 Continue ASA, Prasugrel , statin Discussed with vascular surgery team   Acute on chronic anemia Likely ABLA d/t multiple surgical procedures  Hemoglobin 6.8 on 05/19/2023 and received 1 unit  Continue to monitor CBC   DM2, hyperglycemia  Continue SSI + basal    Anxiety Continue BuSpar     GERD Continue PPI   Hypokalemia Resolved   HTN CAD Continue metoprolol , Imdur , amlodipine  and losartan  Continue ASA, Statin. Prasugrel    Debility, ambulatory dysfunction  Continue PT/OT   R BKA stump hematoma - corrected w/ revision surgery 12/27 S/p revision 05/16/2023 Vascular surgery monitoring  Patient received 1 unit of blood transfusion on 05/19/2023 Monitor CBC   Obesity Class 1 based on BMI: Body mass index is 33.72 kg/m.  Counseled on weight loss when medically stable   DVT prophylaxis: Continue lovenox     Central lines / invasive devices: none   Code Status: FULL CODE     TOC needs: SNF/rehab now that has BKA   Barriers to dispo / significant pending items: PT/OT as able,SNF placement    Family Communication: Discussed with sister at bedside   Subjective / Brief ROS:  Pain seems much improved with addition of gabapentin .  It was making him a little sleepy.   Physical Exam General.  Well-developed gentleman, in no acute distress. Pulmonary.  Lungs clear bilaterally, normal respiratory effort. CV.  Regular rate and rhythm, no  JVD, rub or murmur. Abdomen.  Soft, nontender, nondistended, BS positive. CNS.  Alert and oriented .  No focal neurologic deficit. Extremities.  Right BKA Psychiatry.  Judgment and insight appears normal.      Data Reviewed:    Latest Ref Rng & Units 05/22/2023    3:39 AM 05/21/2023    3:13  AM 05/20/2023    4:01 AM  BMP  Glucose 70 - 99 mg/dL 852  840  858   BUN 6 - 20 mg/dL 14  15  15    Creatinine 0.61 - 1.24 mg/dL 9.28  9.16  9.15   Sodium 135 - 145 mmol/L 136  137  134   Potassium 3.5 - 5.1 mmol/L 4.1  4.2  3.8   Chloride 98 - 111 mmol/L 99  103  100   CO2 22 - 32 mmol/L 28  28  27    Calcium  8.9 - 10.3 mg/dL 8.6  8.3  8.2      Vitals:   05/24/23 0721 05/25/23 0009 05/25/23 0730 05/25/23 0907  BP: 134/83 124/69  128/76  Pulse: 85 82  85  Resp: 18 18 18 16   Temp: 98.4 F (36.9 C) 98 F (36.7 C)  98.1 F (36.7 C)  TempSrc:  Oral  Oral  SpO2: 96% 92%  92%  Weight:      Height:          Latest Ref Rng & Units 05/22/2023    3:39 AM 05/21/2023    3:13 AM 05/20/2023    4:01 AM  CBC  WBC 4.0 - 10.5 K/uL 10.9  9.2  12.0   Hemoglobin 13.0 - 17.0 g/dL 8.4  8.1  7.8   Hematocrit 39.0 - 52.0 % 25.9  24.3  23.3   Platelets 150 - 400 K/uL 475  451  427      Author: Amaryllis Dare, MD 05/25/2023 1:37 PM  For on call review [email or web address].

## 2023-05-26 DIAGNOSIS — R2689 Other abnormalities of gait and mobility: Secondary | ICD-10-CM | POA: Diagnosis not present

## 2023-05-26 DIAGNOSIS — G546 Phantom limb syndrome with pain: Secondary | ICD-10-CM | POA: Diagnosis not present

## 2023-05-26 DIAGNOSIS — A419 Sepsis, unspecified organism: Secondary | ICD-10-CM | POA: Diagnosis not present

## 2023-05-26 DIAGNOSIS — E1152 Type 2 diabetes mellitus with diabetic peripheral angiopathy with gangrene: Secondary | ICD-10-CM | POA: Diagnosis not present

## 2023-05-26 DIAGNOSIS — F419 Anxiety disorder, unspecified: Secondary | ICD-10-CM | POA: Diagnosis not present

## 2023-05-26 DIAGNOSIS — E11628 Type 2 diabetes mellitus with other skin complications: Secondary | ICD-10-CM | POA: Diagnosis not present

## 2023-05-26 DIAGNOSIS — Z794 Long term (current) use of insulin: Secondary | ICD-10-CM | POA: Diagnosis not present

## 2023-05-26 DIAGNOSIS — M6281 Muscle weakness (generalized): Secondary | ICD-10-CM | POA: Diagnosis not present

## 2023-05-26 DIAGNOSIS — E1140 Type 2 diabetes mellitus with diabetic neuropathy, unspecified: Secondary | ICD-10-CM | POA: Diagnosis not present

## 2023-05-26 DIAGNOSIS — R278 Other lack of coordination: Secondary | ICD-10-CM | POA: Diagnosis not present

## 2023-05-26 DIAGNOSIS — Z9861 Coronary angioplasty status: Secondary | ICD-10-CM | POA: Diagnosis not present

## 2023-05-26 DIAGNOSIS — I2510 Atherosclerotic heart disease of native coronary artery without angina pectoris: Secondary | ICD-10-CM

## 2023-05-26 DIAGNOSIS — Z7401 Bed confinement status: Secondary | ICD-10-CM | POA: Diagnosis not present

## 2023-05-26 DIAGNOSIS — I10 Essential (primary) hypertension: Secondary | ICD-10-CM | POA: Diagnosis not present

## 2023-05-26 DIAGNOSIS — M549 Dorsalgia, unspecified: Secondary | ICD-10-CM | POA: Diagnosis not present

## 2023-05-26 DIAGNOSIS — I739 Peripheral vascular disease, unspecified: Secondary | ICD-10-CM | POA: Diagnosis not present

## 2023-05-26 DIAGNOSIS — Z89511 Acquired absence of right leg below knee: Secondary | ICD-10-CM | POA: Diagnosis not present

## 2023-05-26 DIAGNOSIS — M1990 Unspecified osteoarthritis, unspecified site: Secondary | ICD-10-CM | POA: Diagnosis not present

## 2023-05-26 DIAGNOSIS — G894 Chronic pain syndrome: Secondary | ICD-10-CM | POA: Diagnosis not present

## 2023-05-26 DIAGNOSIS — N138 Other obstructive and reflux uropathy: Secondary | ICD-10-CM | POA: Diagnosis not present

## 2023-05-26 DIAGNOSIS — R29898 Other symptoms and signs involving the musculoskeletal system: Secondary | ICD-10-CM | POA: Diagnosis not present

## 2023-05-26 DIAGNOSIS — N401 Enlarged prostate with lower urinary tract symptoms: Secondary | ICD-10-CM | POA: Diagnosis not present

## 2023-05-26 DIAGNOSIS — Z79891 Long term (current) use of opiate analgesic: Secondary | ICD-10-CM | POA: Diagnosis not present

## 2023-05-26 DIAGNOSIS — Z48812 Encounter for surgical aftercare following surgery on the circulatory system: Secondary | ICD-10-CM | POA: Diagnosis not present

## 2023-05-26 DIAGNOSIS — E1165 Type 2 diabetes mellitus with hyperglycemia: Secondary | ICD-10-CM | POA: Diagnosis not present

## 2023-05-26 DIAGNOSIS — L089 Local infection of the skin and subcutaneous tissue, unspecified: Secondary | ICD-10-CM | POA: Diagnosis not present

## 2023-05-26 DIAGNOSIS — E1143 Type 2 diabetes mellitus with diabetic autonomic (poly)neuropathy: Secondary | ICD-10-CM | POA: Diagnosis not present

## 2023-05-26 LAB — GLUCOSE, CAPILLARY
Glucose-Capillary: 171 mg/dL — ABNORMAL HIGH (ref 70–99)
Glucose-Capillary: 139 mg/dL — ABNORMAL HIGH (ref 70–99)
Glucose-Capillary: 209 mg/dL — ABNORMAL HIGH (ref 70–99)

## 2023-05-26 MED ORDER — LOSARTAN POTASSIUM 50 MG PO TABS: 50.0000 mg | ORAL_TABLET | Freq: Every day | ORAL | Status: DC

## 2023-05-26 MED ORDER — GABAPENTIN 300 MG PO CAPS: ORAL_CAPSULE | ORAL | Status: DC

## 2023-05-26 MED ORDER — HYDROCODONE-ACETAMINOPHEN 5-325 MG PO TABS: 1.0000 | ORAL_TABLET | ORAL | 0 refills | Status: DC | PRN

## 2023-05-26 MED ORDER — METOCLOPRAMIDE HCL 5 MG PO TABS: 5.0000 mg | ORAL_TABLET | Freq: Three times a day (TID) | ORAL | Status: DC | PRN

## 2023-05-26 MED ORDER — POLYETHYLENE GLYCOL 3350 17 G PO PACK: 17.0000 g | PACK | Freq: Every day | ORAL | Status: DC

## 2023-05-26 MED ORDER — INSULIN GLARGINE-YFGN 100 UNIT/ML ~~LOC~~ SOLN: 40.0000 [IU] | Freq: Every day | SUBCUTANEOUS | Status: DC

## 2023-05-26 MED ORDER — GABAPENTIN 300 MG PO CAPS: 300.0000 mg | ORAL_CAPSULE | Freq: Every day | ORAL | Status: DC

## 2023-05-26 MED ORDER — ENSURE ENLIVE PO LIQD: 237.0000 mL | Freq: Two times a day (BID) | ORAL | Status: DC

## 2023-05-26 MED ORDER — INSULIN ASPART 100 UNIT/ML IJ SOLN: 3.0000 [IU] | Freq: Three times a day (TID) | INTRAMUSCULAR | Status: DC

## 2023-05-26 MED ORDER — INSULIN ASPART 100 UNIT/ML IJ SOLN: 0.0000 [IU] | INTRAMUSCULAR | Status: DC

## 2023-05-26 MED ORDER — MAGNESIUM HYDROXIDE 400 MG/5ML PO SUSP: 30.0000 mL | Freq: Every evening | ORAL | Status: DC | PRN

## 2023-05-26 MED ORDER — GABAPENTIN 300 MG PO CAPS: 600.0000 mg | ORAL_CAPSULE | Freq: Every day | ORAL | Status: DC

## 2023-05-26 NOTE — Discharge Summary (Signed)
 Physician Discharge Summary   Patient: Gerald Ellis MRN: [id number] DOB: 1965-08-14  Admit date:     05/07/2023  Discharge date: 05/26/23  Discharge Physician: Amaryllis Dare   PCP: Valora Agent, MD   Recommendations at discharge:  Please obtain CBC and BMP on follow-up Follow-up with vascular surgery Follow-up with primary care provider  Discharge Diagnoses: Principal Problem:   Diabetic foot infection (HCC) Active Problems:   Sepsis (HCC)   BPH with obstruction/lower urinary tract symptoms   CAD S/P percutaneous coronary angioplasty   HTN (hypertension)   PAD (peripheral artery disease) (HCC)   Chronic back pain   Anxiety   Obesity, Class III, BMI 40-49.9 (morbid obesity) (HCC)   Uncontrolled type 2 diabetes mellitus with hyperglycemia, with long-term current use of insulin  (HCC)  Resolved Problems:   * No resolved hospital problems. *  Hospital Course: HPI: Gerald Ellis is a 58 y.o. male with medical history significant for HTN, HLD, CAD s/p stent, DM with neuropathy, chronic back pain on opiates,, anxiety, chronic ulcer first right MTP, followed by podiatry (last seen 02/18/2023), a with normal resting ABI at the time and treated with topical dressings who presents to the ED on 05/07/23 with a 1 week history of blistering in the area of the wound and then over the past day he noted that his second and third toes of the right foot were turning black.   Hospital course / significant events:  12/19: admitted to hospitalist service, cefepime  + vanc, podiatry and vascular consults, underwent transmetatarsal amputation R foot 12/20: underwent angiography RLE w/ stent placement  12/21: monitoring post-revascularization 12/22: podiatry reeval, planning to OR for washout/I&D tomorrow given sub-cutaneous gas on x-ray today. Wound Cx (+)mrsa, enterococcus  12/23: underwent irrigation/debridement R foot, washout  12/24: foot not healing / not salvageable, planning BKA.   12/25: stable, await BKA tomorrow  12/26: underwent BKA 12/27: bleeding from stump overnight, back to OR for revision d/t hematoma   12/28: stable, planning for bandage change Mon 12/30  Patient remained stable and slowly improved.  Completed the course of antibiotic.  He will continue with aspirin , prasugrel  and statin and need to have a close follow-up with vascular surgery.  Patient did develop some phantom pain and started on gabapentin , dose was increased to 600 mg for night and he will continue with 300 mg in the morning.  Patient is also using opioids as needed.  Patient did developed acute on chronic anemia likely due to acute blood loss with multiple surgical procedures and received a unit of PRBC.  Hemoglobin was 8.4 on 05/22/2023.  Did develop some electrolyte abnormalities during the course of hospitalization which were corrected.  Our physical therapist evaluated him and recommended rehab on discharge.  He is being discharged for further assistance.  Patient will continue on current medications and need to have a close follow-up with his providers for further management.   Pain control - [place]  Controlled Substance Reporting System database was reviewed. and patient was instructed, not to drive, operate heavy machinery, perform activities at heights, swimming or participation in water activities or provide baby-sitting services while on Pain, Sleep and Anxiety Medications; until their outpatient Physician has advised to do so again. Also recommended to not to take more than prescribed Pain, Sleep and Anxiety Medications.  Consultants: Podiatry.  Vascular surgery.  Infectious disease Procedures performed:  05/08/2023: Transmetatarsal amputation right foot - Dr Neill  05/09/2023: RLE angioplasty w/ stent to superficial femoral, popliteal, anterior tibial  arteries - Dr. Jama  05/12/2023: irrigation/debridement R foot, washout - Dr Neill  05/15/2023: R BKA - Dr  Marea 05/16/2023: Revision R BKA - Dr Marea   Disposition: Skilled nursing facility Diet recommendation:  Discharge Diet Orders (From admission, onward)     Start     Ordered   05/26/23 0000  Diet - low sodium heart healthy        05/26/23 1133           Cardiac and Carb modified diet DISCHARGE MEDICATION: Allergies as of 05/26/2023       Reactions   Ibuprofen Shortness Of Breath   Nsaids Swelling   Swelling of throat        Medication List     STOP taking these medications    chlorpheniramine-HYDROcodone  10-8 MG/5ML Commonly known as: TUSSIONEX   cyclobenzaprine  10 MG tablet Commonly known as: FLEXERIL    HumaLOG KwikPen 100 UNIT/ML KwikPen Generic drug: insulin  lispro   HumuLIN R  U-500 KwikPen 500 UNIT/ML KwikPen Generic drug: insulin  regular human CONCENTRATED   Ipratropium-Albuterol  20-100 MCG/ACT Aers respimat Commonly known as: COMBIVENT   lisinopril  20 MG tablet Commonly known as: ZESTRIL    predniSONE  20 MG tablet Commonly known as: DELTASONE    silver  sulfADIAZINE  1 % cream Commonly known as: SILVADENE    Tresiba  FlexTouch 200 UNIT/ML FlexTouch Pen Generic drug: insulin  degludec       TAKE these medications    amLODipine  5 MG tablet Commonly known as: NORVASC  Take 1 tablet (5 mg total) by mouth daily.   aspirin  81 MG chewable tablet Chew 1 tablet (81 mg total) by mouth daily.   atorvastatin  80 MG tablet Commonly known as: Lipitor  Take 1 tablet (80 mg total) by mouth daily at 6 PM.   busPIRone  7.5 MG tablet Commonly known as: BUSPAR  Take 7.5 mg by mouth 2 (two) times daily.   cetirizine 10 MG tablet Commonly known as: ZYRTEC Take 10 mg by mouth daily.   feeding supplement Liqd Take 237 mLs by mouth 2 (two) times daily between meals.   Fish Oil 500 MG Caps Take 500 mg by mouth 2 (two) times daily.   fluticasone  50 MCG/ACT nasal spray Commonly known as: FLONASE  Place 1 spray into both nostrils 2 (two) times daily as needed (nasal  congestion).   Fluticasone -Umeclidin-Vilant 200-62.5-25 MCG/INH Aepb Inhale into the lungs.   gabapentin  300 MG capsule Commonly known as: NEURONTIN  Take 1 capsule (300 mg total) by mouth daily AND 2 capsules (600 mg total) at bedtime. 300 mg in the morning and 600 mg at bedtime.   HYDROcodone -acetaminophen  5-325 MG tablet Commonly known as: NORCO/VICODIN Take 1-2 tablets by mouth every 4 (four) hours as needed for severe pain (pain score 7-10) or moderate pain (pain score 4-6). What changed:  how much to take how to take this when to take this reasons to take this additional instructions   insulin  aspart 100 UNIT/ML injection Commonly known as: novoLOG  Inject 3 Units into the skin 3 (three) times daily with meals.   insulin  aspart 100 UNIT/ML injection Commonly known as: novoLOG  Inject 0-20 Units into the skin every 4 (four) hours.   insulin  glargine-yfgn 100 UNIT/ML injection Commonly known as: SEMGLEE  Inject 0.4 mLs (40 Units total) into the skin at bedtime.   isosorbide  mononitrate 30 MG 24 hr tablet Commonly known as: IMDUR  Take 30 mg by mouth daily.   losartan  50 MG tablet Commonly known as: COZAAR  Take 1 tablet (50 mg total) by mouth daily.  magnesium  hydroxide 400 MG/5ML suspension Commonly known as: MILK OF MAGNESIA Take 30 mLs by mouth at bedtime as needed for mild constipation or moderate constipation.   metFORMIN 1000 MG tablet Commonly known as: GLUCOPHAGE Take 1,000 mg by mouth 2 (two) times daily.   metoCLOPramide  5 MG tablet Commonly known as: REGLAN  Take 1 tablet (5 mg total) by mouth every 8 (eight) hours as needed for nausea or vomiting. What changed:  when to take this reasons to take this   metoprolol  succinate 50 MG 24 hr tablet Commonly known as: TOPROL -XL Take 50 mg by mouth daily. Take with or immediately following a meal.   montelukast  10 MG tablet Commonly known as: SINGULAIR  Take by mouth.   multivitamin with minerals  tablet Take 1 tablet by mouth 2 (two) times daily.   mupirocin  ointment 2 % Commonly known as: BACTROBAN  SMARTSIG:1 Application Topical 2-3 Times Daily   ondansetron  4 MG disintegrating tablet Commonly known as: ZOFRAN -ODT Take 1 tablet (4 mg total) by mouth every 8 (eight) hours as needed for nausea or vomiting.   pantoprazole  40 MG tablet Commonly known as: Protonix  Take 1 tablet (40 mg total) by mouth daily. What changed: Another medication with the same name was removed. Continue taking this medication, and follow the directions you see here.   pioglitazone  15 MG tablet Commonly known as: ACTOS  Take 15 mg by mouth 2 (two) times daily.   polyethylene glycol 17 g packet Commonly known as: MIRALAX  / GLYCOLAX  Take 17 g by mouth daily.   prasugrel  10 MG Tabs tablet Commonly known as: EFFIENT  Take 1 tablet (10 mg total) by mouth daily.   sucralfate 1 GM/10ML suspension Commonly known as: CARAFATE Take 1 g by mouth 4 (four) times daily -  with meals and at bedtime.   tadalafil 20 MG tablet Commonly known as: CIALIS Take 20 mg by mouth daily as needed for erectile dysfunction.   Voltaren 1 % Gel Generic drug: diclofenac Sodium Apply 2 g topically 4 (four) times daily.        Contact information for follow-up providers     Brown, Fallon E, NP Follow up in 3 week(s).   Specialty: Vascular Surgery Why: Staple and suturte removal. Contact information: [address] [place] [address] [phone]         Valora Agent, MD. Schedule an appointment as soon as possible for a visit in 1 week(s).   Specialty: Family Medicine Contact information: [address] [facility] [place] [address] [phone]              Contact information for after-discharge care     Destination     [facility]erred [facility] .   Service: Skilled Nursing Contact information: [address] [place]  [place]  [address] [phone]                    Discharge Exam: Fredricka Weights   05/07/23 1918 05/12/23 1519 05/15/23 0806  Weight: 106.6 kg 106.6 kg 106.6 kg   General.  Obese gentleman, in no acute distress. Pulmonary.  Lungs clear bilaterally, normal respiratory effort. CV.  Regular rate and rhythm, no JVD, rub or murmur. Abdomen.  Soft, nontender, nondistended, BS positive. CNS.  Alert and oriented .  No focal neurologic deficit. Extremities.  Right BKA, no edema on left  Condition at discharge: stable  The results of significant diagnostics from this hospitalization (including imaging, microbiology, ancillary  and laboratory) are listed below for reference.   Imaging Studies: DG Foot Complete Right Result Date: 05/13/2023 CLINICAL DATA:  Postop check status post transmetatarsal amputation and debridement of the mid calf. EXAM: RIGHT FOOT COMPLETE - 3+ VIEW; RIGHT TIBIA AND FIBULA - 2 VIEW COMPARISON:  Foot radiographs 05/11/2023 and 05/07/2023. MRI 05/07/2023. FINDINGS: Right tibia and fibula: The mineralization and alignment are normal. There is no evidence of acute fracture or dislocation. The joint spaces appear preserved at the knee. There are vascular stents posterior to the distal right femur and within the popliteal fossa. No focal soft tissue abnormalities are identified allowing for artifact associated with overlying sheets or clothing. Right foot: Status post transmetatarsal amputation through the bases of all the metatarsals, unchanged from the prior study of 2 days earlier. No progressive bone destruction. A small amount of gas is present within the distal soft tissues, nonspecific. Skin staples are in place. IMPRESSION: 1. Stable postoperative appearance of the right foot status post transmetatarsal amputation. No evidence of progressive bone destruction. 2. No acute osseous findings in the right lower leg. Electronically Signed   By: Elsie Perone M.D.   On:  05/13/2023 13:52   DG Tibia/Fibula Right Result Date: 05/13/2023 CLINICAL DATA:  Postop check status post transmetatarsal amputation and debridement of the mid calf. EXAM: RIGHT FOOT COMPLETE - 3+ VIEW; RIGHT TIBIA AND FIBULA - 2 VIEW COMPARISON:  Foot radiographs 05/11/2023 and 05/07/2023. MRI 05/07/2023. FINDINGS: Right tibia and fibula: The mineralization and alignment are normal. There is no evidence of acute fracture or dislocation. The joint spaces appear preserved at the knee. There are vascular stents posterior to the distal right femur and within the popliteal fossa. No focal soft tissue abnormalities are identified allowing for artifact associated with overlying sheets or clothing. Right foot: Status post transmetatarsal amputation through the bases of all the metatarsals, unchanged from the prior study of 2 days earlier. No progressive bone destruction. A small amount of gas is present within the distal soft tissues, nonspecific. Skin staples are in place. IMPRESSION: 1. Stable postoperative appearance of the right foot status post transmetatarsal amputation. No evidence of progressive bone destruction. 2. No acute osseous findings in the right lower leg. Electronically Signed   By: Elsie Perone M.D.   On: 05/13/2023 13:52   DG Foot Complete Right Result Date: 05/11/2023 CLINICAL DATA:  Postop evaluation. EXAM: RIGHT FOOT COMPLETE - 3+ VIEW COMPARISON:  Right foot radiograph dated 05/07/2023. FINDINGS: Transmetatarsal amputation of the foot. No acute fracture or dislocation. Postsurgical changes in the soft tissues and cutaneous staples. Overlying dressing noted. IMPRESSION: Transmetatarsal amputation of the foot. Electronically Signed   By: Vanetta Chou M.D.   On: 05/11/2023 16:17   PERIPHERAL VASCULAR CATHETERIZATION Result Date: 05/09/2023 See surgical note for result.  MR FOOT RIGHT WO CONTRAST Result Date: 05/08/2023 CLINICAL DATA:  Soft tissue infection suspected. Right foot  blistering for a few months with recent onset of discoloration of the 2nd toe. EXAM: MRI OF THE RIGHT FOREFOOT WITHOUT CONTRAST TECHNIQUE: Multiplanar, multisequence MR imaging of the right forefoot was performed. No intravenous contrast was administered. COMPARISON:  Radiographs same date. No other comparison studies available. FINDINGS: Technical note: Despite efforts by the technologist and patient, mild motion artifact is present on today's exam and could not be eliminated. This reduces exam sensitivity and specificity. Bones/Joint/Cartilage As shown on same-day radiographs, there is attenuation of the soft tissues of the 2nd toe. No underlying cortical destruction or marrow signal abnormality  is identified to suggest osteomyelitis. There is no evidence of acute fracture or dislocation. No significant joint effusions are identified. The alignment is normal at the Lisfranc joint. Ligaments Intact Lisfranc ligament. The collateral ligaments of the metatarsophalangeal joints appear intact. Muscles and Tendons Generalized forefoot muscular atrophy. Dorsal soft tissue emphysema with prominent fluid in the extensor tendon sheaths of the 2nd ray. There is a small amount of fluid within the flexor digitorum tendon sheaths. Soft tissues As seen on radiographs, there is extensive soft tissue emphysema within the medial aspect of the forefoot, extending into the 1st and 2nd web spaces. There is associated ill-defined surrounding fluid, greatest dorsally. This process extends proximally to the level of the mid 2nd metatarsal. These findings remain highly concerning for necrotizing soft tissue infection. No well-defined soft tissue abscess. IMPRESSION: 1. Extensive soft tissue emphysema within the medial aspect of the forefoot, extending into the 1st and 2nd web spaces, with associated ill-defined surrounding fluid, greatest dorsally. These findings remain highly concerning for necrotizing soft tissue infection. No focal  abscess identified. 2. No evidence of osteomyelitis or septic arthritis. 3. Prominent fluid in the extensor tendon sheaths of the 2nd ray, likely septic tenosynovitis. Electronically Signed   By: Elsie Perone M.D.   On: 05/08/2023 10:06   DG Foot Complete Right Result Date: 05/07/2023 CLINICAL DATA:  Foot infection second toe is black EXAM: RIGHT FOOT COMPLETE - 3+ VIEW COMPARISON:  Report 04/06/2020 FINDINGS: No fracture or malalignment. Wound or ulcer plantar aspect of the distal foot. Gas within the soft tissues diffusely surrounding the second digit from the level of the distal metatarsal to the distal phalanx. Additional gas present within the soft tissues of the medial first digit, between the second and third digits, and between the fourth and fifth digits. No obvious osseous destructive change. IMPRESSION: Wound or ulcer plantar aspect of the distal foot with gas within the soft tissues diffusely surrounding the second digit and additional gas present within the soft tissues of the medial first digit, between the second and third digits, and between the fourth and fifth digits. Findings are suspicious for necrotic soft tissue infection. No definite acute osseous abnormality. Electronically Signed   By: Luke Bun M.D.   On: 05/07/2023 21:06    Microbiology: Results for orders placed or performed during the hospital encounter of 05/07/23  Blood culture (single)     Status: None   Collection Time: 05/07/23  7:21 PM   Specimen: BLOOD  Result Value Ref Range Status   Specimen Description BLOOD LEFT FOREARM  Final   Special Requests   Final    BOTTLES DRAWN AEROBIC AND ANAEROBIC Blood Culture adequate volume   Culture   Final    NO GROWTH 5 DAYS Performed at [facility], [address] [place], [place] [address]    Report Status 05/12/2023 FINAL  Final  Surgical pcr screen     Status: Abnormal   Collection Time: 05/08/23  8:51 AM   Specimen: Nasal Mucosa; Nasal Swab   Result Value Ref Range Status   MRSA, PCR NEGATIVE NEGATIVE Final   Staphylococcus aureus POSITIVE (A) NEGATIVE Final    Comment: (NOTE) The Xpert SA Assay (FDA approved for NASAL specimens in patients 53 years of age and older), is one component of a comprehensive surveillance program. It is not intended to diagnose infection nor to guide or monitor treatment. Performed at [facility], [address] [place], [place] [address]   Aerobic/Anaerobic Culture w  Gram Stain (surgical/deep wound)     Status: None   Collection Time: 05/08/23 11:54 AM   Specimen: Path Tissue  Result Value Ref Range Status   Specimen Description   Final    TISSUE Performed at [facility], [address], [place], [place] [address]    Special Requests SWAB OF RIGHT FOOT DEEP ABSCESS  Final   Gram Stain NO WBC SEEN FEW GRAM POSITIVE COCCI IN PAIRS   Final   Culture   Final    FEW ENTEROCOCCUS FAECALIS FEW METHICILLIN RESISTANT STAPHYLOCOCCUS AUREUS NO ANAEROBES ISOLATED Performed at [facility], [address], [place], [place] [address]    Report Status 05/13/2023 FINAL  Final   Organism ID, Bacteria ENTEROCOCCUS FAECALIS  Final   Organism ID, Bacteria METHICILLIN RESISTANT STAPHYLOCOCCUS AUREUS  Final      Susceptibility   Enterococcus faecalis - MIC*    AMPICILLIN <=2 SENSITIVE Sensitive     VANCOMYCIN  2 SENSITIVE Sensitive     GENTAMICIN SYNERGY SENSITIVE Sensitive     * FEW ENTEROCOCCUS FAECALIS   Methicillin resistant staphylococcus aureus - MIC*    CIPROFLOXACIN 2 INTERMEDIATE Intermediate     ERYTHROMYCIN >=8 RESISTANT Resistant     GENTAMICIN <=0.5 SENSITIVE Sensitive     OXACILLIN RESISTANT Resistant     TETRACYCLINE <=1 SENSITIVE Sensitive     VANCOMYCIN  <=0.5 SENSITIVE Sensitive     TRIMETH/SULFA <=10 SENSITIVE Sensitive     CLINDAMYCIN 2 INTERMEDIATE Intermediate     RIFAMPIN <=0.5 SENSITIVE Sensitive     Inducible Clindamycin NEGATIVE Sensitive      LINEZOLID 2 SENSITIVE Sensitive     * FEW METHICILLIN RESISTANT STAPHYLOCOCCUS AUREUS  Aerobic/Anaerobic Culture w Gram Stain (surgical/deep wound)     Status: None   Collection Time: 05/12/23  4:29 PM   Specimen: Path Tissue  Result Value Ref Range Status   Specimen Description   Final    TISSUE Performed at [facility], [address], [place], [place] [address]    Special Requests   Final    NONE Performed at [facility], [address] [place], [place] [address]    Gram Stain   Final    RARE WBC PRESENT,BOTH PMN AND MONONUCLEAR NO ORGANISMS SEEN    Culture   Final    RARE ENTEROCOCCUS FAECALIS CRITICAL RESULT CALLED TO, READ BACK BY AND VERIFIED WITH: RN kelly h. [phone] fcp NO ANAEROBES ISOLATED Performed at [facility], [address], [place], [place] [address]    Report Status 05/17/2023 FINAL  Final   Organism ID, Bacteria ENTEROCOCCUS FAECALIS  Final      Susceptibility   Enterococcus faecalis - MIC*    AMPICILLIN <=2 SENSITIVE Sensitive     VANCOMYCIN  2 SENSITIVE Sensitive     GENTAMICIN SYNERGY SENSITIVE Sensitive     * RARE ENTEROCOCCUS FAECALIS    Labs: CBC: Recent Labs  Lab 05/19/23 2025 05/20/23 0401 05/21/23 0313 05/22/23 0339  WBC  --  12.0* 9.2 10.9*  NEUTROABS  --  8.0* 5.7 6.7  HGB 7.5* 7.8* 8.1* 8.4*  HCT 22.2* 23.3* 24.3* 25.9*  MCV  --  83.2 85.0 87.2  PLT  --  427* 451* 475*   Basic Metabolic Panel: Recent Labs  Lab 05/20/23 0401 05/21/23 0313 05/22/23 0339  NA 134* 137 136  K 3.8 4.2 4.1  CL 100 103 99  CO2 27 28 28   GLUCOSE 141* 159* 147*  BUN 15  15 14  CREATININE 0.84 0.83 0.71  CALCIUM  8.2* 8.3* 8.6*   Liver Function Tests: No results for input(s): AST, ALT, ALKPHOS, BILITOT, PROT, ALBUMIN in the last 168 hours. CBG: Recent Labs  Lab 05/25/23 2012 05/25/23 2346 05/26/23 0353 05/26/23 0801 05/26/23 1115  GLUCAP 298* 236* 171* 139* 209*    Discharge time spent: greater  than 30 minutes.  This record has been created using Conservation officer, historic buildings. Errors have been sought and corrected,but may not always be located. Such creation errors do not reflect on the standard of care.   Signed: Amaryllis Dare, MD [facility] 05/26/2023

## 2023-05-26 NOTE — TOC Transition Note (Signed)
 Transition of Care [facility]) - Discharge Note   Patient Details  Name: Gerald Ellis MRN: [id number] Date of Birth: September 12, 1965  Transition of Care [facility]) CM/SW Contact:  Jasmane Brockway A Jarret Torre, RN Phone Number: 05/26/2023, 4:01 PM   Clinical Narrative:    Chart reviewed.  Noted that patient has orders for discharge today.  I have spoken with Sierra at [facility] and informed her that patient would has been approved for SNF.  I have informed her that patient's Passr number is [id number] A. Sierra informed me that patient will go to room 1106 B and the number to call report is [phone]. I have sent Sierra patient's SNF Transfer Report, Discharge Summary, and Discharge Orders via Epic hub.    I have informed patient that [facility] will have a bed for him today.  I have made him aware that [facility] will transport patient to SNF today.  Patient has made his wife aware and I have also left a voice message for Mrs. Monarch.    I have arranged [facility] for to transport patient to the facility today.    I have made staff nurse aware of the above information.        Final next level of care: Skilled Nursing Facility Barriers to Discharge: No Barriers Identified   Patient Goals and CMS Choice Patient states their goals for this hospitalization and ongoing recovery are:: home with sister, with home health therapy CMS Medicare.gov Compare Post Acute Care list provided to:: Patient Choice offered to / list presented to : Patient      Discharge Placement              Patient chooses bed at: [facility] Patient to be transferred to facility by: [facility] Name of family member notified: Patient reports that he has informed his wife.  I also left Mrs. Hetzer a voicemail Patient and family notified of of transfer: 05/26/23  Discharge Plan and Services Additional resources added to the After Visit Summary for                                        Social Drivers of Health (SDOH) Interventions SDOH Screenings   Food Insecurity: No Food Insecurity (05/08/2023)  Recent Concern: Food Insecurity - Food Insecurity Present (02/18/2023)   Received from [facility]  Housing: Unknown (05/08/2023)  Transportation Needs: No Transportation Needs (05/08/2023)  Utilities: Not At Risk (05/08/2023)  Recent Concern: Utilities - At Risk (02/18/2023)   Received from [facility]  Financial Resource Strain: Low Risk  (02/18/2023)   Received from [facility]  Recent Concern: Financial Resource Strain - High Risk (12/24/2022)   Received from [facility]  Physical Activity: Insufficiently Active (04/20/2019)  Social Connections: Moderately Isolated (04/20/2019)  Stress: No Stress Concern Present (04/20/2019)  Tobacco Use: Low Risk  (05/16/2023)     Readmission Risk Interventions     No data to display

## 2023-05-26 NOTE — Plan of Care (Signed)
  Problem: Education: Goal: Ability to describe self-care measures that may prevent or decrease complications (Diabetes Survival Skills Education) will improve Outcome: Progressing   Problem: Coping: Goal: Ability to adjust to condition or change in health will improve Outcome: Progressing   Problem: Respiratory: Goal: Ability to maintain adequate ventilation will improve Outcome: Adequate for Discharge

## 2023-05-27 DIAGNOSIS — I739 Peripheral vascular disease, unspecified: Secondary | ICD-10-CM | POA: Diagnosis not present

## 2023-05-27 DIAGNOSIS — G894 Chronic pain syndrome: Secondary | ICD-10-CM | POA: Diagnosis not present

## 2023-05-27 DIAGNOSIS — Z89511 Acquired absence of right leg below knee: Secondary | ICD-10-CM | POA: Diagnosis not present

## 2023-05-27 DIAGNOSIS — E1140 Type 2 diabetes mellitus with diabetic neuropathy, unspecified: Secondary | ICD-10-CM | POA: Diagnosis not present

## 2023-05-27 DIAGNOSIS — F419 Anxiety disorder, unspecified: Secondary | ICD-10-CM | POA: Diagnosis not present

## 2023-05-27 DIAGNOSIS — M1990 Unspecified osteoarthritis, unspecified site: Secondary | ICD-10-CM | POA: Diagnosis not present

## 2023-05-27 DIAGNOSIS — G546 Phantom limb syndrome with pain: Secondary | ICD-10-CM | POA: Diagnosis not present

## 2023-05-27 DIAGNOSIS — Z79891 Long term (current) use of opiate analgesic: Secondary | ICD-10-CM | POA: Diagnosis not present

## 2023-05-27 DIAGNOSIS — I10 Essential (primary) hypertension: Secondary | ICD-10-CM | POA: Diagnosis not present

## 2023-05-27 DIAGNOSIS — Z48812 Encounter for surgical aftercare following surgery on the circulatory system: Secondary | ICD-10-CM | POA: Diagnosis not present

## 2023-05-27 DIAGNOSIS — E1152 Type 2 diabetes mellitus with diabetic peripheral angiopathy with gangrene: Secondary | ICD-10-CM | POA: Diagnosis not present

## 2023-05-30 DIAGNOSIS — I10 Essential (primary) hypertension: Secondary | ICD-10-CM | POA: Diagnosis not present

## 2023-05-30 DIAGNOSIS — E1140 Type 2 diabetes mellitus with diabetic neuropathy, unspecified: Secondary | ICD-10-CM | POA: Diagnosis not present

## 2023-05-30 DIAGNOSIS — I739 Peripheral vascular disease, unspecified: Secondary | ICD-10-CM | POA: Diagnosis not present

## 2023-05-30 DIAGNOSIS — Z48812 Encounter for surgical aftercare following surgery on the circulatory system: Secondary | ICD-10-CM | POA: Diagnosis not present

## 2023-05-30 DIAGNOSIS — G894 Chronic pain syndrome: Secondary | ICD-10-CM | POA: Diagnosis not present

## 2023-05-30 DIAGNOSIS — E1152 Type 2 diabetes mellitus with diabetic peripheral angiopathy with gangrene: Secondary | ICD-10-CM | POA: Diagnosis not present

## 2023-05-30 DIAGNOSIS — F419 Anxiety disorder, unspecified: Secondary | ICD-10-CM | POA: Diagnosis not present

## 2023-05-30 DIAGNOSIS — Z89511 Acquired absence of right leg below knee: Secondary | ICD-10-CM | POA: Diagnosis not present

## 2023-06-02 DIAGNOSIS — Z48812 Encounter for surgical aftercare following surgery on the circulatory system: Secondary | ICD-10-CM | POA: Diagnosis not present

## 2023-06-02 DIAGNOSIS — E1152 Type 2 diabetes mellitus with diabetic peripheral angiopathy with gangrene: Secondary | ICD-10-CM | POA: Diagnosis not present

## 2023-06-02 DIAGNOSIS — F419 Anxiety disorder, unspecified: Secondary | ICD-10-CM | POA: Diagnosis not present

## 2023-06-02 DIAGNOSIS — G894 Chronic pain syndrome: Secondary | ICD-10-CM | POA: Diagnosis not present

## 2023-06-02 DIAGNOSIS — I739 Peripheral vascular disease, unspecified: Secondary | ICD-10-CM | POA: Diagnosis not present

## 2023-06-02 DIAGNOSIS — I10 Essential (primary) hypertension: Secondary | ICD-10-CM | POA: Diagnosis not present

## 2023-06-02 DIAGNOSIS — Z89511 Acquired absence of right leg below knee: Secondary | ICD-10-CM | POA: Diagnosis not present

## 2023-06-02 DIAGNOSIS — E1140 Type 2 diabetes mellitus with diabetic neuropathy, unspecified: Secondary | ICD-10-CM | POA: Diagnosis not present

## 2023-06-03 DIAGNOSIS — Z48812 Encounter for surgical aftercare following surgery on the circulatory system: Secondary | ICD-10-CM | POA: Diagnosis not present

## 2023-06-03 DIAGNOSIS — I10 Essential (primary) hypertension: Secondary | ICD-10-CM | POA: Diagnosis not present

## 2023-06-03 DIAGNOSIS — E1152 Type 2 diabetes mellitus with diabetic peripheral angiopathy with gangrene: Secondary | ICD-10-CM | POA: Diagnosis not present

## 2023-06-03 DIAGNOSIS — Z89511 Acquired absence of right leg below knee: Secondary | ICD-10-CM | POA: Diagnosis not present

## 2023-06-03 DIAGNOSIS — G894 Chronic pain syndrome: Secondary | ICD-10-CM | POA: Diagnosis not present

## 2023-06-03 DIAGNOSIS — E1140 Type 2 diabetes mellitus with diabetic neuropathy, unspecified: Secondary | ICD-10-CM | POA: Diagnosis not present

## 2023-06-03 DIAGNOSIS — F419 Anxiety disorder, unspecified: Secondary | ICD-10-CM | POA: Diagnosis not present

## 2023-06-03 DIAGNOSIS — I739 Peripheral vascular disease, unspecified: Secondary | ICD-10-CM | POA: Diagnosis not present

## 2023-06-04 DIAGNOSIS — I739 Peripheral vascular disease, unspecified: Secondary | ICD-10-CM | POA: Diagnosis not present

## 2023-06-04 DIAGNOSIS — E1152 Type 2 diabetes mellitus with diabetic peripheral angiopathy with gangrene: Secondary | ICD-10-CM | POA: Diagnosis not present

## 2023-06-04 DIAGNOSIS — F419 Anxiety disorder, unspecified: Secondary | ICD-10-CM | POA: Diagnosis not present

## 2023-06-04 DIAGNOSIS — Z48812 Encounter for surgical aftercare following surgery on the circulatory system: Secondary | ICD-10-CM | POA: Diagnosis not present

## 2023-06-04 DIAGNOSIS — G894 Chronic pain syndrome: Secondary | ICD-10-CM | POA: Diagnosis not present

## 2023-06-04 DIAGNOSIS — Z89511 Acquired absence of right leg below knee: Secondary | ICD-10-CM | POA: Diagnosis not present

## 2023-06-04 DIAGNOSIS — E1140 Type 2 diabetes mellitus with diabetic neuropathy, unspecified: Secondary | ICD-10-CM | POA: Diagnosis not present

## 2023-06-04 DIAGNOSIS — I10 Essential (primary) hypertension: Secondary | ICD-10-CM | POA: Diagnosis not present

## 2023-06-05 DIAGNOSIS — E1165 Type 2 diabetes mellitus with hyperglycemia: Secondary | ICD-10-CM | POA: Diagnosis not present

## 2023-06-05 DIAGNOSIS — I10 Essential (primary) hypertension: Secondary | ICD-10-CM | POA: Diagnosis not present

## 2023-06-05 DIAGNOSIS — I2510 Atherosclerotic heart disease of native coronary artery without angina pectoris: Secondary | ICD-10-CM | POA: Diagnosis not present

## 2023-06-05 DIAGNOSIS — E1143 Type 2 diabetes mellitus with diabetic autonomic (poly)neuropathy: Secondary | ICD-10-CM | POA: Diagnosis not present

## 2023-06-05 DIAGNOSIS — Z794 Long term (current) use of insulin: Secondary | ICD-10-CM | POA: Diagnosis not present

## 2023-06-05 DIAGNOSIS — Z89511 Acquired absence of right leg below knee: Secondary | ICD-10-CM | POA: Diagnosis not present

## 2023-06-06 DIAGNOSIS — E1140 Type 2 diabetes mellitus with diabetic neuropathy, unspecified: Secondary | ICD-10-CM | POA: Diagnosis not present

## 2023-06-06 DIAGNOSIS — Z48812 Encounter for surgical aftercare following surgery on the circulatory system: Secondary | ICD-10-CM | POA: Diagnosis not present

## 2023-06-06 DIAGNOSIS — Z89511 Acquired absence of right leg below knee: Secondary | ICD-10-CM | POA: Diagnosis not present

## 2023-06-06 DIAGNOSIS — F419 Anxiety disorder, unspecified: Secondary | ICD-10-CM | POA: Diagnosis not present

## 2023-06-06 DIAGNOSIS — I10 Essential (primary) hypertension: Secondary | ICD-10-CM | POA: Diagnosis not present

## 2023-06-06 DIAGNOSIS — I739 Peripheral vascular disease, unspecified: Secondary | ICD-10-CM | POA: Diagnosis not present

## 2023-06-06 DIAGNOSIS — E1152 Type 2 diabetes mellitus with diabetic peripheral angiopathy with gangrene: Secondary | ICD-10-CM | POA: Diagnosis not present

## 2023-06-06 DIAGNOSIS — G894 Chronic pain syndrome: Secondary | ICD-10-CM | POA: Diagnosis not present

## 2023-06-06 NOTE — Anesthesia Postprocedure Evaluation (Signed)
Anesthesia Post Note  Patient: Gerald Ellis  Procedure(s) Performed: REVISION OF BELOW KNEE AMPUTATION (Right: Knee)  Patient location during evaluation: PACU Anesthesia Type: General Level of consciousness: awake and alert Pain management: satisfactory to patient Vital Signs Assessment: post-procedure vital signs reviewed and stable Respiratory status: spontaneous breathing Cardiovascular status: stable Anesthetic complications: no   No notable events documented.   Last Vitals:  Vitals:   05/26/23 0133 05/26/23 0758  BP: 135/71 (!) 146/82  Pulse: 83 87  Resp: 16 17  Temp: 37.4 C 36.7 C  SpO2: 96% 99%    Last Pain:  Vitals:   05/26/23 1445  TempSrc:   PainSc: 5                  VAN STAVEREN,Tristy Udovich

## 2023-06-10 DIAGNOSIS — Z4781 Encounter for orthopedic aftercare following surgical amputation: Secondary | ICD-10-CM | POA: Diagnosis not present

## 2023-06-10 DIAGNOSIS — Z993 Dependence on wheelchair: Secondary | ICD-10-CM | POA: Diagnosis not present

## 2023-06-10 DIAGNOSIS — I872 Venous insufficiency (chronic) (peripheral): Secondary | ICD-10-CM | POA: Diagnosis not present

## 2023-06-10 DIAGNOSIS — F419 Anxiety disorder, unspecified: Secondary | ICD-10-CM | POA: Diagnosis not present

## 2023-06-10 DIAGNOSIS — Z87891 Personal history of nicotine dependence: Secondary | ICD-10-CM | POA: Diagnosis not present

## 2023-06-10 DIAGNOSIS — Z7951 Long term (current) use of inhaled steroids: Secondary | ICD-10-CM | POA: Diagnosis not present

## 2023-06-10 DIAGNOSIS — Z6831 Body mass index (BMI) 31.0-31.9, adult: Secondary | ICD-10-CM | POA: Diagnosis not present

## 2023-06-10 DIAGNOSIS — F32A Depression, unspecified: Secondary | ICD-10-CM | POA: Diagnosis not present

## 2023-06-10 DIAGNOSIS — I2510 Atherosclerotic heart disease of native coronary artery without angina pectoris: Secondary | ICD-10-CM | POA: Diagnosis not present

## 2023-06-10 DIAGNOSIS — Z7902 Long term (current) use of antithrombotics/antiplatelets: Secondary | ICD-10-CM | POA: Diagnosis not present

## 2023-06-10 DIAGNOSIS — Z89511 Acquired absence of right leg below knee: Secondary | ICD-10-CM | POA: Diagnosis not present

## 2023-06-10 DIAGNOSIS — E1142 Type 2 diabetes mellitus with diabetic polyneuropathy: Secondary | ICD-10-CM | POA: Diagnosis not present

## 2023-06-10 DIAGNOSIS — Z79899 Other long term (current) drug therapy: Secondary | ICD-10-CM | POA: Diagnosis not present

## 2023-06-10 DIAGNOSIS — Z7984 Long term (current) use of oral hypoglycemic drugs: Secondary | ICD-10-CM | POA: Diagnosis not present

## 2023-06-10 DIAGNOSIS — E7800 Pure hypercholesterolemia, unspecified: Secondary | ICD-10-CM | POA: Diagnosis not present

## 2023-06-10 DIAGNOSIS — G894 Chronic pain syndrome: Secondary | ICD-10-CM | POA: Diagnosis not present

## 2023-06-10 DIAGNOSIS — M51369 Other intervertebral disc degeneration, lumbar region without mention of lumbar back pain or lower extremity pain: Secondary | ICD-10-CM | POA: Diagnosis not present

## 2023-06-10 DIAGNOSIS — Z955 Presence of coronary angioplasty implant and graft: Secondary | ICD-10-CM | POA: Diagnosis not present

## 2023-06-10 DIAGNOSIS — Z7982 Long term (current) use of aspirin: Secondary | ICD-10-CM | POA: Diagnosis not present

## 2023-06-10 DIAGNOSIS — J45909 Unspecified asthma, uncomplicated: Secondary | ICD-10-CM | POA: Diagnosis not present

## 2023-06-10 DIAGNOSIS — N400 Enlarged prostate without lower urinary tract symptoms: Secondary | ICD-10-CM | POA: Diagnosis not present

## 2023-06-10 DIAGNOSIS — Z794 Long term (current) use of insulin: Secondary | ICD-10-CM | POA: Diagnosis not present

## 2023-06-10 DIAGNOSIS — E1151 Type 2 diabetes mellitus with diabetic peripheral angiopathy without gangrene: Secondary | ICD-10-CM | POA: Diagnosis not present

## 2023-06-12 DIAGNOSIS — Z9289 Personal history of other medical treatment: Secondary | ICD-10-CM | POA: Diagnosis not present

## 2023-06-12 DIAGNOSIS — R809 Proteinuria, unspecified: Secondary | ICD-10-CM | POA: Diagnosis not present

## 2023-06-12 DIAGNOSIS — Z794 Long term (current) use of insulin: Secondary | ICD-10-CM | POA: Diagnosis not present

## 2023-06-12 DIAGNOSIS — E1159 Type 2 diabetes mellitus with other circulatory complications: Secondary | ICD-10-CM | POA: Diagnosis not present

## 2023-06-12 DIAGNOSIS — E113299 Type 2 diabetes mellitus with mild nonproliferative diabetic retinopathy without macular edema, unspecified eye: Secondary | ICD-10-CM | POA: Diagnosis not present

## 2023-06-12 DIAGNOSIS — E1169 Type 2 diabetes mellitus with other specified complication: Secondary | ICD-10-CM | POA: Diagnosis not present

## 2023-06-12 DIAGNOSIS — E1129 Type 2 diabetes mellitus with other diabetic kidney complication: Secondary | ICD-10-CM | POA: Diagnosis not present

## 2023-06-12 DIAGNOSIS — E785 Hyperlipidemia, unspecified: Secondary | ICD-10-CM | POA: Diagnosis not present

## 2023-06-12 DIAGNOSIS — E1165 Type 2 diabetes mellitus with hyperglycemia: Secondary | ICD-10-CM | POA: Diagnosis not present

## 2023-06-12 DIAGNOSIS — E1143 Type 2 diabetes mellitus with diabetic autonomic (poly)neuropathy: Secondary | ICD-10-CM | POA: Diagnosis not present

## 2023-06-12 DIAGNOSIS — Z89511 Acquired absence of right leg below knee: Secondary | ICD-10-CM | POA: Diagnosis not present

## 2023-06-17 ENCOUNTER — Encounter (INDEPENDENT_AMBULATORY_CARE_PROVIDER_SITE_OTHER): Payer: Self-pay | Admitting: Nurse Practitioner

## 2023-06-17 ENCOUNTER — Ambulatory Visit (INDEPENDENT_AMBULATORY_CARE_PROVIDER_SITE_OTHER): Payer: PPO | Admitting: Nurse Practitioner

## 2023-06-17 VITALS — BP 119/72 | HR 91 | Resp 18 | Ht 70.0 in | Wt 228.0 lb

## 2023-06-17 DIAGNOSIS — H2513 Age-related nuclear cataract, bilateral: Secondary | ICD-10-CM | POA: Diagnosis not present

## 2023-06-17 DIAGNOSIS — E113313 Type 2 diabetes mellitus with moderate nonproliferative diabetic retinopathy with macular edema, bilateral: Secondary | ICD-10-CM | POA: Diagnosis not present

## 2023-06-17 DIAGNOSIS — Z89511 Acquired absence of right leg below knee: Secondary | ICD-10-CM

## 2023-06-17 NOTE — Progress Notes (Signed)
Subjective:    Patient ID: Gerald Ellis, male    DOB: 07/10/1965, 58 y.o.   MRN: [id number] Chief Complaint  Patient presents with   Follow-up    Follow up with [name], NP (Vascular Surgery) in 3 weeks (06/10/2023); Staple and suturte removal.    The patient presents today for follow-up regarding her right below-knee amputation done on 05/15/2023.  He subsequently developed a hematoma which required evacuation on 05/16/2023.  Today his wound is intact with some areas of scabbing.  It was closed with staples and sutures.  Currently his pain seems to be well-controlled.    Review of Systems  Skin:  Positive for wound.  All other systems reviewed and are negative.      Objective:   Physical Exam Vitals reviewed.  HENT:     Head: Normocephalic.  Cardiovascular:     Rate and Rhythm: Normal rate.  Pulmonary:     Effort: Pulmonary effort is normal.  Musculoskeletal:     Right Lower Extremity: Right leg is amputated below knee.  Skin:    General: Skin is warm and dry.  Neurological:     Mental Status: He is alert and oriented to person, place, and time.  Psychiatric:        Mood and Affect: Mood normal.        Behavior: Behavior normal.        Thought Content: Thought content normal.        Judgment: Judgment normal.     BP 119/72   Pulse 91   Resp 18   Ht 5\' 10"  (1.778 m)   Wt 228 lb (103.4 kg)   BMI 32.71 kg/m   Past Medical History:  Diagnosis Date   Benign enlargement of prostate    Bronchitis    Childhood asthma    Diabetes mellitus without complication (HCC)    Environmental allergies    Erectile dysfunction    Headache    Hemorrhoids    Hypercholesteremia    Hypertension    Hypogonadism in male    Lumbago    Over weight     Social History   Socioeconomic History   Marital status: Married    Spouse name: Not on file   Number of children: Not on file   Years of education: Not on file   Highest education level: Not on file   Occupational History   Not on file  Tobacco Use   Smoking status: Never   Smokeless tobacco: Never  Substance and Sexual Activity   Alcohol use: Yes    Alcohol/week: 0.0 standard drinks of alcohol    Comment: occasionally   Drug use: No   Sexual activity: Not Currently  Other Topics Concern   Not on file  Social History Narrative   Not on file   Social Drivers of Health   Financial Resource Strain: Low Risk  (02/18/2023)   Received from [facility]   Overall Financial Resource Strain (CARDIA)    Difficulty of Paying Living Expenses: Not hard at all  Recent Concern: Financial Resource Strain - High Risk (12/24/2022)   Received from [facility]   Overall Financial Resource Strain (CARDIA)    Difficulty of Paying Living Expenses: Very hard  Food Insecurity: No Food Insecurity (05/08/2023)   Hunger Vital Sign    Worried About Running Out of Food in the Last Year: Never true    Ran Out of Food in the Last Year:  Never true  Recent Concern: Food Insecurity - Food Insecurity Present (02/18/2023)   Received from [facility]   Hunger Vital Sign    Worried About Running Out of Food in the Last Year: Sometimes true    Ran Out of Food in the Last Year: Sometimes true  Transportation Needs: No Transportation Needs (05/08/2023)   PRAPARE - Administrator, Civil Service (Medical): No    Lack of Transportation (Non-Medical): No  Physical Activity: Insufficiently Active (04/20/2019)   Exercise Vital Sign    Days of Exercise per Week: 1 day    Minutes of Exercise per Session: 10 min  Stress: No Stress Concern Present (04/20/2019)   [employer] of Occupational Health - Occupational Stress Questionnaire    Feeling of Stress : Not at all  Social Connections: Moderately Isolated (04/20/2019)   Social Connection and Isolation Panel [NHANES]    Frequency of Communication with Friends and Family: Three times a week    Frequency  of Social Gatherings with Friends and Family: Once a week    Attends Religious Services: Never    Database administrator or Organizations: No    Attends Banker Meetings: Never    Marital Status: Married  Catering manager Violence: Not At Risk (05/08/2023)   Humiliation, Afraid, Rape, and Kick questionnaire    Fear of Current or Ex-Partner: No    Emotionally Abused: No    Physically Abused: No    Sexually Abused: No    Past Surgical History:  Procedure Laterality Date   AMPUTATION Right 05/15/2023   Procedure: AMPUTATION BELOW KNEE;  Surgeon: Annice Needy, MD;  Location: [facility] ORS;  Service: General;  Laterality: Right;   AMPUTATION Right 05/16/2023   Procedure: REVISION OF BELOW KNEE AMPUTATION;  Surgeon: Annice Needy, MD;  Location: [facility] ORS;  Service: General;  Laterality: Right;   BACK SURGERY  1992   CHOLECYSTECTOMY     COLONOSCOPY WITH PROPOFOL N/A 08/07/2015   Procedure: COLONOSCOPY WITH PROPOFOL;  Surgeon: Christena Deem, MD;  Location[facility] ENDOSCOPY;  Service: Endoscopy;  Laterality: N/A;   COLONOSCOPY WITH PROPOFOL N/A 08/08/2015   Procedure: COLONOSCOPY WITH PROPOFOL;  Surgeon: Christena Deem, MD;  Location: [facility] ENDOSCOPY;  Service: Endoscopy;  Laterality: N/A;   COLONOSCOPY WITH PROPOFOL N/A 09/16/2017   Procedure: COLONOSCOPY WITH PROPOFOL;  Surgeon: Christena Deem, MD;  Location: [facility] ENDOSCOPY;  Service: Endoscopy;  Laterality: N/A;   COLONOSCOPY WITH PROPOFOL N/A 12/20/2020   Procedure: COLONOSCOPY WITH PROPOFOL;  Surgeon: Earline Mayotte, MD;  Location: [facility] ENDOSCOPY;  Service: Endoscopy;  Laterality: N/A;  IDDM   CORONARY STENT INTERVENTION N/A 04/20/2019   Procedure: CORONARY STENT INTERVENTION;  Surgeon: Marcina Millard, MD;  Location: [facility] INVASIVE CV LAB;  Service: Cardiovascular;  Laterality: N/A;   ESOPHAGOGASTRODUODENOSCOPY (EGD) WITH PROPOFOL N/A 08/07/2015   Procedure: ESOPHAGOGASTRODUODENOSCOPY (EGD) WITH PROPOFOL;  Surgeon: Christena Deem, MD;  Location: [facility] ENDOSCOPY;  Service: Endoscopy;  Laterality: N/A;   ESOPHAGOGASTRODUODENOSCOPY (EGD) WITH PROPOFOL N/A 09/16/2017   Procedure: ESOPHAGOGASTRODUODENOSCOPY (EGD) WITH PROPOFOL;  Surgeon: Christena Deem, MD;  Location: [facility] ENDOSCOPY;  Service: Endoscopy;  Laterality: N/A;   ESOPHAGOGASTRODUODENOSCOPY (EGD) WITH PROPOFOL N/A 12/20/2020   Procedure: ESOPHAGOGASTRODUODENOSCOPY (EGD) WITH PROPOFOL;  Surgeon: Earline Mayotte, MD;  Location: [facility] ENDOSCOPY;  Service: Endoscopy;  Laterality: N/A;   IRRIGATION AND DEBRIDEMENT FOOT Right 05/12/2023   Procedure: IRRIGATION AND DEBRIDEMENT FOOT, WASH OUT;  Surgeon: Linus Galas, DPM;  Location: [facility] ORS;  Service: Orthopedics/Podiatry;  Laterality: Right;   LEFT HEART CATH AND CORONARY ANGIOGRAPHY Left 04/20/2019   Procedure: LEFT HEART CATH AND CORONARY ANGIOGRAPHY;  Surgeon: Marcina Millard, MD;  Location: [facility] INVASIVE CV LAB;  Service: Cardiovascular;  Laterality: Left;   LOWER EXTREMITY ANGIOGRAPHY Right 05/09/2023   Procedure: Lower Extremity Angiography;  Surgeon: Renford Dills, MD;  Location: [facility] INVASIVE CV LAB;  Service: Cardiovascular;  Laterality: Right;   TRANSMETATARSAL AMPUTATION Right 05/08/2023   Procedure: TRANSMETATARSAL AMPUTATION;  Surgeon: Linus Galas, DPM;  Location: [facility] ORS;  Service: Orthopedics/Podiatry;  Laterality: Right;    Family History  Problem Relation Age of Onset   Heart disease Mother        CABG   Lung cancer Mother        Disseminated   Hypertension Mother    Hyperlipidemia Mother    Diabetes Father        mother   Colon cancer Father 57   Cancer Maternal Aunt        x3 aunts, unk types of cancer   Colon cancer Maternal Uncle        x3 uncles   Prostate cancer Maternal Uncle        x4 uncles   Cancer Paternal Uncle        prostate vs colon   Alzheimer's disease Other    Kidney disease Neg Hx     Allergies  Allergen Reactions   Ibuprofen Shortness Of Breath    Nsaids Swelling    Swelling of throat       Latest Ref Rng & Units 05/22/2023    3:39 AM 05/21/2023    3:13 AM 05/20/2023    4:01 AM  CBC  WBC 4.0 - 10.5 K/uL 10.9  9.2  12.0   Hemoglobin 13.0 - 17.0 g/dL 8.4  8.1  7.8   Hematocrit 39.0 - 52.0 % 25.9  24.3  23.3   Platelets 150 - 400 K/uL 475  451  427       CMP     Component Value Date/Time   NA 136 05/22/2023 0339   K 4.1 05/22/2023 0339   CL 99 05/22/2023 0339   CO2 28 05/22/2023 0339   GLUCOSE 147 (H) 05/22/2023 0339   BUN 14 05/22/2023 0339   CREATININE 0.71 05/22/2023 0339   CALCIUM 8.6 (L) 05/22/2023 0339   PROT 8.3 (H) 05/07/2023 1921   ALBUMIN 2.6 (L) 05/07/2023 1921   AST 36 05/07/2023 1921   ALT 41 05/07/2023 1921   ALKPHOS 97 05/07/2023 1921   BILITOT 0.9 05/07/2023 1921   GFRNONAA >60 05/22/2023 0339     No results found.     Assessment & Plan:   1. S/P BKA (below knee amputation), right (HCC) (Primary) Sutures removed today and patient tolerated this well.  Currently no evidence of dehiscence but there is some scabbing in the area.  There does not appear to be obvious areas of infection.  Will have the patient return in a week for further staple removal   Current Outpatient Medications on File Prior to Visit  Medication Sig Dispense Refill   amLODipine (NORVASC) 5 MG tablet Take 1 tablet (5 mg total) by mouth daily. 30 tablet 11   aspirin 81 MG chewable tablet Chew 1 tablet (81 mg total) by mouth daily. 30 tablet 11   atorvastatin (LIPITOR) 80 MG tablet Take 1 tablet (80 mg total) by mouth daily at 6 PM. 30 tablet 11  busPIRone (BUSPAR) 7.5 MG tablet Take 7.5 mg by mouth 2 (two) times daily.     cetirizine (ZYRTEC) 10 MG tablet Take 10 mg by mouth daily.     Continuous Glucose Sensor (FREESTYLE LIBRE 2 SENSOR) MISC USE 1 KIT EVERY 14 (FOURTEEN) DAYS FOR GLUCOSE MONITORING     diclofenac Sodium (VOLTAREN) 1 % GEL Apply 2 g topically 4 (four) times daily.     feeding supplement (ENSURE ENLIVE /  ENSURE PLUS) LIQD Take 237 mLs by mouth 2 (two) times daily between meals.     fluticasone (FLONASE) 50 MCG/ACT nasal spray Place 1 spray into both nostrils 2 (two) times daily as needed (nasal congestion).     Fluticasone-Umeclidin-Vilant 200-62.5-25 MCG/INH AEPB Inhale into the lungs.     gabapentin (NEURONTIN) 300 MG capsule Take 1 capsule (300 mg total) by mouth daily AND 2 capsules (600 mg total) at bedtime. 300 mg in the morning and 600 mg at bedtime.     HUMALOG KWIKPEN 200 UNIT/ML KwikPen Inject 22 units three times daily before meals.     HYDROcodone-acetaminophen (NORCO/VICODIN) 5-325 MG tablet Take 1-2 tablets by mouth every 4 (four) hours as needed for severe pain (pain score 7-10) or moderate pain (pain score 4-6). 30 tablet 0   insulin aspart (NOVOLOG) 100 UNIT/ML injection Inject 3 Units into the skin 3 (three) times daily with meals.     insulin aspart (NOVOLOG) 100 UNIT/ML injection Inject 0-20 Units into the skin every 4 (four) hours.     insulin degludec (TRESIBA FLEXTOUCH) 200 UNIT/ML FlexTouch Pen Inject 70 units nightly     insulin glargine-yfgn (SEMGLEE) 100 UNIT/ML injection Inject 0.4 mLs (40 Units total) into the skin at bedtime.     isosorbide mononitrate (IMDUR) 30 MG 24 hr tablet Take 30 mg by mouth daily.     losartan (COZAAR) 50 MG tablet Take 1 tablet (50 mg total) by mouth daily.     magnesium hydroxide (MILK OF MAGNESIA) 400 MG/5ML suspension Take 30 mLs by mouth at bedtime as needed for mild constipation or moderate constipation.     metFORMIN (GLUCOPHAGE) 1000 MG tablet Take 1,000 mg by mouth 2 (two) times daily.      metoCLOPramide (REGLAN) 5 MG tablet Take 1 tablet (5 mg total) by mouth every 8 (eight) hours as needed for nausea or vomiting.     metoprolol succinate (TOPROL-XL) 50 MG 24 hr tablet Take 50 mg by mouth daily. Take with or immediately following a meal.     Multiple Vitamins-Minerals (MULTIVITAMIN WITH MINERALS) tablet Take 1 tablet by mouth 2 (two)  times daily.      mupirocin ointment (BACTROBAN) 2 % SMARTSIG:1 Application Topical 2-3 Times Daily     naloxone (NARCAN) nasal spray 4 mg/0.1 mL Place into the nose.     Omega-3 Fatty Acids (FISH OIL) 500 MG CAPS Take 500 mg by mouth 2 (two) times daily.     ondansetron (ZOFRAN-ODT) 4 MG disintegrating tablet Take 1 tablet (4 mg total) by mouth every 8 (eight) hours as needed for nausea or vomiting. 8 tablet 0   oxyCODONE-acetaminophen (PERCOCET) 10-325 MG tablet 1 tablet as needed Orally every 4 to 6 hrs for 30 days moderate pain     pantoprazole (PROTONIX) 40 MG tablet Take 1 tablet (40 mg total) by mouth daily. 30 tablet 1   pioglitazone (ACTOS) 15 MG tablet Take 15 mg by mouth 2 (two) times daily.     polyethylene glycol (MIRALAX / GLYCOLAX) 17  g packet Take 17 g by mouth daily.     prasugrel (EFFIENT) 10 MG TABS tablet Take 1 tablet (10 mg total) by mouth daily. 30 tablet    sucralfate (CARAFATE) 1 GM/10ML suspension Take 1 g by mouth 4 (four) times daily -  with meals and at bedtime.     tadalafil (CIALIS) 20 MG tablet Take 20 mg by mouth daily as needed for erectile dysfunction.     montelukast (SINGULAIR) 10 MG tablet Take by mouth.     No current facility-administered medications on file prior to visit.    There are no Patient Instructions on file for this visit. No follow-ups on file.   [name], NP

## 2023-06-24 ENCOUNTER — Telehealth (INDEPENDENT_AMBULATORY_CARE_PROVIDER_SITE_OTHER): Payer: Self-pay

## 2023-06-24 DIAGNOSIS — Z79891 Long term (current) use of opiate analgesic: Secondary | ICD-10-CM | POA: Diagnosis not present

## 2023-06-24 DIAGNOSIS — G546 Phantom limb syndrome with pain: Secondary | ICD-10-CM | POA: Diagnosis not present

## 2023-06-24 DIAGNOSIS — G894 Chronic pain syndrome: Secondary | ICD-10-CM | POA: Diagnosis not present

## 2023-06-24 DIAGNOSIS — M1990 Unspecified osteoarthritis, unspecified site: Secondary | ICD-10-CM | POA: Diagnosis not present

## 2023-06-24 DIAGNOSIS — F1910 Other psychoactive substance abuse, uncomplicated: Secondary | ICD-10-CM | POA: Diagnosis not present

## 2023-06-24 NOTE — Telephone Encounter (Signed)
 Connie home health nurse with [facility] left a message stating that while the patient was doing physical therapy the other day he felt a pop in his back. The patient begin having radiating pain down to his knee and swelling. Patient would like to know if he should have a xray. Please advise with vascular standpoint.

## 2023-06-24 NOTE — Telephone Encounter (Signed)
I would have him reach out to his PCP for evaluation given the pop came from his lower back.  This would not be vascular in nature.

## 2023-06-24 NOTE — Telephone Encounter (Signed)
Gerald Ellis with [facility] was notified with medical recommendations and I did reach out to the patient but was not able leave voicemail. Gerald Ellis will notified patient with recommendations and informed him that our office tried reach out

## 2023-06-25 ENCOUNTER — Other Ambulatory Visit (INDEPENDENT_AMBULATORY_CARE_PROVIDER_SITE_OTHER): Payer: Self-pay | Admitting: Nurse Practitioner

## 2023-06-25 DIAGNOSIS — E7800 Pure hypercholesterolemia, unspecified: Secondary | ICD-10-CM | POA: Diagnosis not present

## 2023-06-25 DIAGNOSIS — I25118 Atherosclerotic heart disease of native coronary artery with other forms of angina pectoris: Secondary | ICD-10-CM | POA: Diagnosis not present

## 2023-06-25 DIAGNOSIS — I10 Essential (primary) hypertension: Secondary | ICD-10-CM | POA: Diagnosis not present

## 2023-06-25 DIAGNOSIS — Z955 Presence of coronary angioplasty implant and graft: Secondary | ICD-10-CM | POA: Diagnosis not present

## 2023-06-25 DIAGNOSIS — Z89511 Acquired absence of right leg below knee: Secondary | ICD-10-CM | POA: Diagnosis not present

## 2023-06-25 NOTE — Telephone Encounter (Signed)
 Patient notified with medical recommendations and verbalized understanding

## 2023-06-25 NOTE — Telephone Encounter (Signed)
 I spoke with the patient, he stated that pain is more in his right above knee amputation with swelling . The pain level is 6 out 10 with right above knee amputation. Patient has been taking ibuprofen, gabapentin , and oxycodone  for pain relief. Patient has upcoming appointment on 06/30/2023

## 2023-06-25 NOTE — Telephone Encounter (Signed)
 Patient called returning your call. He can be reached at [phone].

## 2023-06-25 NOTE — Telephone Encounter (Signed)
 In reviewing the records it appears that the patient is receiving chronic pain medication from a different provider.  I am willing to send in a one time, small dose of pain medication for breakthrough pain.  However, the patient should be aware that in some instances, pain medication practices do not allow outside pain medication and may stop providing for his chronic pain medication.  If that happens, we will not take over providing chronic pain medication for the patient.  If he is willing to take that risk, we can send in breakthrough pain medication.  He may also want to reach out to the MD providing his chronic pain medication to see if this is acceptable to them.  As far as the swelling, I would wrap his residual limb with ace to help with swelling

## 2023-06-30 ENCOUNTER — Ambulatory Visit (INDEPENDENT_AMBULATORY_CARE_PROVIDER_SITE_OTHER): Payer: PPO | Admitting: Nurse Practitioner

## 2023-06-30 ENCOUNTER — Encounter (INDEPENDENT_AMBULATORY_CARE_PROVIDER_SITE_OTHER): Payer: Self-pay | Admitting: Nurse Practitioner

## 2023-06-30 VITALS — BP 142/79 | HR 91 | Resp 16

## 2023-06-30 DIAGNOSIS — Z89511 Acquired absence of right leg below knee: Secondary | ICD-10-CM

## 2023-06-30 NOTE — Progress Notes (Addendum)
Subjective:    Patient ID: Gerald Ellis, male    DOB: 05/23/1965, 58 y.o.   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  He subsequently developed a hematoma which required evacuation on 05/16/2023.  Today his wound is intact with some areas of scabbing.  It was closed with staples and sutures.  Currently his pain seems to be well-controlled.  It is controlled with pain medication prescribed by an outside provider.  He does note that he is fallen on his residual limb and bumped it as well.    Review of Systems  Skin:  Positive for wound.  All other systems reviewed and are negative.      Objective:   Physical Exam Vitals reviewed.  HENT:     Head: Normocephalic.  Cardiovascular:     Rate and Rhythm: Normal rate.  Pulmonary:     Effort: Pulmonary effort is normal.  Musculoskeletal:     Right Lower Extremity: Right leg is amputated below knee.  Skin:    General: Skin is warm and dry.  Neurological:     Mental Status: He is alert and oriented to person, place, and time.  Psychiatric:        Mood and Affect: Mood normal.        Behavior: Behavior normal.        Thought Content: Thought content normal.        Judgment: Judgment normal.     BP (!) 142/79   Pulse 91   Resp 16   Past Medical History:  Diagnosis Date   Benign enlargement of prostate    Bronchitis    Childhood asthma    Diabetes mellitus without complication (HCC)    Environmental allergies    Erectile dysfunction    Headache    Hemorrhoids    Hypercholesteremia    Hypertension    Hypogonadism in male    Lumbago    Over weight     Social History   Socioeconomic History   Marital status: Married    Spouse name: Not on file   Number of children: Not on file   Years of education: Not on file   Highest education level: Not on file   Occupational History   Not on file  Tobacco Use   Smoking status: Never   Smokeless tobacco: Never  Substance and Sexual Activity   Alcohol use: Yes    Alcohol/week: 0.0 standard drinks of alcohol    Comment: occasionally   Drug use: No   Sexual activity: Not Currently  Other Topics Concern   Not on file  Social History Narrative   Not on file   Social Drivers of Health   Financial Resource Strain: Low Risk  (02/18/2023)   Received from [facility]   Overall Financial Resource Strain (CARDIA)    Difficulty of Paying Living Expenses: Not hard at all  Recent Concern: Financial Resource Strain - High Risk (12/24/2022)   Received from [facility]   Overall Financial Resource Strain (CARDIA)    Difficulty of Paying Living Expenses: Very hard  Food Insecurity: No Food Insecurity (05/08/2023)   Hunger Vital Sign    Worried About Running Out of Food in the Last Year: Never true    Ran Out of Food in the Last Year:  Never true  Recent Concern: Food Insecurity - Food Insecurity Present (02/18/2023)   Received from [facility]   Hunger Vital Sign    Worried About Running Out of Food in the Last Year: Sometimes true    Ran Out of Food in the Last Year: Sometimes true  Transportation Needs: No Transportation Needs (05/08/2023)   PRAPARE - Administrator, Civil Service (Medical): No    Lack of Transportation (Non-Medical): No  Physical Activity: Insufficiently Active (04/20/2019)   Exercise Vital Sign    Days of Exercise per Week: 1 day    Minutes of Exercise per Session: 10 min  Stress: No Stress Concern Present (04/20/2019)   [employer] of Occupational Health - Occupational Stress Questionnaire    Feeling of Stress : Not at all  Social Connections: Moderately Isolated (04/20/2019)   Social Connection and Isolation Panel [NHANES]    Frequency of Communication with Friends and Family: Three times a week    Frequency  of Social Gatherings with Friends and Family: Once a week    Attends Religious Services: Never    Database administrator or Organizations: No    Attends Banker Meetings: Never    Marital Status: Married  Catering manager Violence: Not At Risk (05/08/2023)   Humiliation, Afraid, Rape, and Kick questionnaire    Fear of Current or Ex-Partner: No    Emotionally Abused: No    Physically Abused: No    Sexually Abused: No    Past Surgical History:  Procedure Laterality Date   AMPUTATION Right 05/15/2023   Procedure: AMPUTATION BELOW KNEE;  Surgeon: Annice Needy, MD;  Location: [facility] ORS;  Service: General;  Laterality: Right;   AMPUTATION Right 05/16/2023   Procedure: REVISION OF BELOW KNEE AMPUTATION;  Surgeon: Annice Needy, MD;  Location: [facility];  Service: General;  Laterality: Right;   BACK SURGERY  1992   CHOLECYSTECTOMY     COLONOSCOPY WITH PROPOFOL N/A 08/07/2015   Procedure: COLONOSCOPY WITH PROPOFOL;  Surgeon: Christena Deem, MD;  Location: [facility] ENDOSCOPY;  Service: Endoscopy;  Laterality: N/A;   COLONOSCOPY WITH PROPOFOL N/A 08/08/2015   Procedure: COLONOSCOPY WITH PROPOFOL;  Surgeon: Christena Deem, MD;  Location: [facility] ENDOSCOPY;  Service: Endoscopy;  Laterality: N/A;   COLONOSCOPY WITH PROPOFOL N/A 09/16/2017   Procedure: COLONOSCOPY WITH PROPOFOL;  Surgeon: Christena Deem, MD;  Location: [facility] ENDOSCOPY;  Service: Endoscopy;  Laterality: N/A;   COLONOSCOPY WITH PROPOFOL N/A 12/20/2020   Procedure: COLONOSCOPY WITH PROPOFOL;  Surgeon: Earline Mayotte, MD;  Location: [facility] ENDOSCOPY;  Service: Endoscopy;  Laterality: N/A;  IDDM   CORONARY STENT INTERVENTION N/A 04/20/2019   Procedure: CORONARY STENT INTERVENTION;  Surgeon: Marcina Millard, MD;  Location: [facility] INVASIVE CV LAB;  Service: Cardiovascular;  Laterality: N/A;   ESOPHAGOGASTRODUODENOSCOPY (EGD) WITH PROPOFOL N/A 08/07/2015   Procedure: ESOPHAGOGASTRODUODENOSCOPY (EGD) WITH PROPOFOL;  Surgeon: Christena Deem, MD;  Location: [facility] ENDOSCOPY;  Service: Endoscopy;  Laterality: N/A;   ESOPHAGOGASTRODUODENOSCOPY (EGD) WITH PROPOFOL N/A 09/16/2017   Procedure: ESOPHAGOGASTRODUODENOSCOPY (EGD) WITH PROPOFOL;  Surgeon: Christena Deem, MD;  Location: [facility] ENDOSCOPY;  Service: Endoscopy;  Laterality: N/A;   ESOPHAGOGASTRODUODENOSCOPY (EGD) WITH PROPOFOL N/A 12/20/2020   Procedure: ESOPHAGOGASTRODUODENOSCOPY (EGD) WITH PROPOFOL;  Surgeon: Earline Mayotte, MD;  Location: [facility] ENDOSCOPY;  Service: Endoscopy;  Laterality: N/A;   IRRIGATION AND DEBRIDEMENT FOOT Right 05/12/2023   Procedure: IRRIGATION AND DEBRIDEMENT FOOT, WASH OUT;  Surgeon: Linus Galas, DPM;  Location: [facility] ORS;  Service: Orthopedics/Podiatry;  Laterality: Right;   LEFT HEART CATH AND CORONARY ANGIOGRAPHY Left 04/20/2019   Procedure: LEFT HEART CATH AND CORONARY ANGIOGRAPHY;  Surgeon: Marcina Millard, MD;  Location: [facility] CV LAB;  Service: Cardiovascular;  Laterality: Left;   LOWER EXTREMITY ANGIOGRAPHY Right 05/09/2023   Procedure: Lower Extremity Angiography;  Surgeon: Renford Dills, MD;  Location: [facility] CV LAB;  Service: Cardiovascular;  Laterality: Right;   TRANSMETATARSAL AMPUTATION Right 05/08/2023   Procedure: TRANSMETATARSAL AMPUTATION;  Surgeon: Linus Galas, DPM;  Location: [facility];  Service: Orthopedics/Podiatry;  Laterality: Right;    Family History  Problem Relation Age of Onset   Heart disease Mother        CABG   Lung cancer Mother        Disseminated   Hypertension Mother    Hyperlipidemia Mother    Diabetes Father        mother   Colon cancer Father 64   Cancer Maternal Aunt        x3 aunts, unk types of cancer   Colon cancer Maternal Uncle        x3 uncles   Prostate cancer Maternal Uncle        x4 uncles   Cancer Paternal Uncle        prostate vs colon   Alzheimer's disease Other    Kidney disease Neg Hx     Allergies  Allergen Reactions   Ibuprofen Shortness Of Breath    Nsaids Swelling    Swelling of throat       Latest Ref Rng & Units 05/22/2023    3:39 AM 05/21/2023    3:13 AM 05/20/2023    4:01 AM  CBC  WBC 4.0 - 10.5 K/uL 10.9  9.2  12.0   Hemoglobin 13.0 - 17.0 g/dL 8.4  8.1  7.8   Hematocrit 39.0 - 52.0 % 25.9  24.3  23.3   Platelets 150 - 400 K/uL 475  451  427       CMP     Component Value Date/Time   NA 136 05/22/2023 0339   K 4.1 05/22/2023 0339   CL 99 05/22/2023 0339   CO2 28 05/22/2023 0339   GLUCOSE 147 (H) 05/22/2023 0339   BUN 14 05/22/2023 0339   CREATININE 0.71 05/22/2023 0339   CALCIUM 8.6 (L) 05/22/2023 0339   PROT 8.3 (H) 05/07/2023 1921   ALBUMIN 2.6 (L) 05/07/2023 1921   AST 36 05/07/2023 1921   ALT 41 05/07/2023 1921   ALKPHOS 97 05/07/2023 1921   BILITOT 0.9 05/07/2023 1921   GFRNONAA >60 05/22/2023 0339     No results found.     Assessment & Plan:   1. S/P BKA (below knee amputation), right (HCC) (Primary) Remaining staples removed today.  Steri-Strips applied.  Patient will return in 2 to 3 weeks for wound reevaluation.  In addition a prescription was given to the patient for his standard wheelchair.  He is able to self propel.  He needs a wheelchair in order to get around safely and prevent falls as he has had several previously.     Current Outpatient Medications on File Prior to Visit  Medication Sig Dispense Refill   amLODipine (NORVASC) 5 MG tablet Take 1 tablet (5 mg total) by mouth daily. 30 tablet 11   aspirin 81 MG chewable tablet Chew 1 tablet (81 mg total) by mouth daily. 30 tablet 11   atorvastatin (LIPITOR) 80 MG tablet  Take 1 tablet (80 mg total) by mouth daily at 6 PM. 30 tablet 11   busPIRone (BUSPAR) 7.5 MG tablet Take 7.5 mg by mouth 2 (two) times daily.     cetirizine (ZYRTEC) 10 MG tablet Take 10 mg by mouth daily.     Continuous Glucose Sensor (FREESTYLE LIBRE 2 SENSOR) MISC USE 1 KIT EVERY 14 (FOURTEEN) DAYS FOR GLUCOSE MONITORING     diclofenac Sodium (VOLTAREN) 1 % GEL  Apply 2 g topically 4 (four) times daily.     feeding supplement (ENSURE ENLIVE / ENSURE PLUS) LIQD Take 237 mLs by mouth 2 (two) times daily between meals.     fluticasone (FLONASE) 50 MCG/ACT nasal spray Place 1 spray into both nostrils 2 (two) times daily as needed (nasal congestion).     Fluticasone-Umeclidin-Vilant 200-62.5-25 MCG/INH AEPB Inhale into the lungs.     gabapentin (NEURONTIN) 300 MG capsule Take 1 capsule (300 mg total) by mouth daily AND 2 capsules (600 mg total) at bedtime. 300 mg in the morning and 600 mg at bedtime.     HUMALOG KWIKPEN 200 UNIT/ML KwikPen Inject 22 units three times daily before meals.     HYDROcodone-acetaminophen (NORCO/VICODIN) 5-325 MG tablet Take 1-2 tablets by mouth every 4 (four) hours as needed for severe pain (pain score 7-10) or moderate pain (pain score 4-6). 30 tablet 0   insulin aspart (NOVOLOG) 100 UNIT/ML injection Inject 3 Units into the skin 3 (three) times daily with meals.     insulin aspart (NOVOLOG) 100 UNIT/ML injection Inject 0-20 Units into the skin every 4 (four) hours.     insulin degludec (TRESIBA FLEXTOUCH) 200 UNIT/ML FlexTouch Pen Inject 70 units nightly     insulin glargine-yfgn (SEMGLEE) 100 UNIT/ML injection Inject 0.4 mLs (40 Units total) into the skin at bedtime.     isosorbide mononitrate (IMDUR) 30 MG 24 hr tablet Take 30 mg by mouth daily.     losartan (COZAAR) 50 MG tablet Take 1 tablet (50 mg total) by mouth daily.     magnesium hydroxide (MILK OF MAGNESIA) 400 MG/5ML suspension Take 30 mLs by mouth at bedtime as needed for mild constipation or moderate constipation.     metFORMIN (GLUCOPHAGE) 1000 MG tablet Take 1,000 mg by mouth 2 (two) times daily.      metoCLOPramide (REGLAN) 5 MG tablet Take 1 tablet (5 mg total) by mouth every 8 (eight) hours as needed for nausea or vomiting.     metoprolol succinate (TOPROL-XL) 50 MG 24 hr tablet Take 50 mg by mouth daily. Take with or immediately following a meal.     Multiple  Vitamins-Minerals (MULTIVITAMIN WITH MINERALS) tablet Take 1 tablet by mouth 2 (two) times daily.      mupirocin ointment (BACTROBAN) 2 % SMARTSIG:1 Application Topical 2-3 Times Daily     naloxone (NARCAN) nasal spray 4 mg/0.1 mL Place into the nose.     Omega-3 Fatty Acids (FISH OIL) 500 MG CAPS Take 500 mg by mouth 2 (two) times daily.     ondansetron (ZOFRAN-ODT) 4 MG disintegrating tablet Take 1 tablet (4 mg total) by mouth every 8 (eight) hours as needed for nausea or vomiting. 8 tablet 0   pantoprazole (PROTONIX) 40 MG tablet Take 1 tablet (40 mg total) by mouth daily. 30 tablet 1   pioglitazone (ACTOS) 15 MG tablet Take 15 mg by mouth 2 (two) times daily.     polyethylene glycol (MIRALAX / GLYCOLAX) 17 g packet Take 17 g by mouth  daily.     prasugrel (EFFIENT) 10 MG TABS tablet Take 1 tablet (10 mg total) by mouth daily. 30 tablet    sucralfate (CARAFATE) 1 GM/10ML suspension Take 1 g by mouth 4 (four) times daily -  with meals and at bedtime.     tadalafil (CIALIS) 20 MG tablet Take 20 mg by mouth daily as needed for erectile dysfunction.     montelukast (SINGULAIR) 10 MG tablet Take by mouth.     No current facility-administered medications on file prior to visit.    There are no Patient Instructions on file for this visit. No follow-ups on file.   [name], NP

## 2023-07-01 ENCOUNTER — Encounter (INDEPENDENT_AMBULATORY_CARE_PROVIDER_SITE_OTHER): Payer: Self-pay | Admitting: Nurse Practitioner

## 2023-07-03 ENCOUNTER — Telehealth (INDEPENDENT_AMBULATORY_CARE_PROVIDER_SITE_OTHER): Payer: Self-pay

## 2023-07-03 DIAGNOSIS — Z0000 Encounter for general adult medical examination without abnormal findings: Secondary | ICD-10-CM | POA: Diagnosis not present

## 2023-07-03 DIAGNOSIS — D649 Anemia, unspecified: Secondary | ICD-10-CM | POA: Diagnosis not present

## 2023-07-03 DIAGNOSIS — I10 Essential (primary) hypertension: Secondary | ICD-10-CM | POA: Diagnosis not present

## 2023-07-03 DIAGNOSIS — J4540 Moderate persistent asthma, uncomplicated: Secondary | ICD-10-CM | POA: Diagnosis not present

## 2023-07-03 DIAGNOSIS — E1151 Type 2 diabetes mellitus with diabetic peripheral angiopathy without gangrene: Secondary | ICD-10-CM | POA: Diagnosis not present

## 2023-07-03 DIAGNOSIS — Z89511 Acquired absence of right leg below knee: Secondary | ICD-10-CM | POA: Diagnosis not present

## 2023-07-03 NOTE — Telephone Encounter (Signed)
[facility] called to see how can the patient get a wheelchair.   Please advise

## 2023-07-03 NOTE — Telephone Encounter (Signed)
He should reach out to [facility] (a medical supply store) to see about getting a wheelchair

## 2023-07-04 NOTE — Telephone Encounter (Addendum)
[facility] called back stating that [employer] has a Rx (paper copy) for [employer], but they are requesting notes from the provider, a Dx, why he needs one, and that he can self propel in the chair.   Please advise  [facility] Phone: [phone] Fax: [phone]

## 2023-07-04 NOTE — Telephone Encounter (Addendum)
Left voicemail at [facility] on [facility] message box

## 2023-07-07 ENCOUNTER — Other Ambulatory Visit (INDEPENDENT_AMBULATORY_CARE_PROVIDER_SITE_OTHER): Payer: Self-pay | Admitting: Nurse Practitioner

## 2023-07-07 NOTE — Telephone Encounter (Signed)
Faxed 07/07/2023

## 2023-07-07 NOTE — Telephone Encounter (Signed)
I have updated his most recent note

## 2023-07-08 DIAGNOSIS — I25118 Atherosclerotic heart disease of native coronary artery with other forms of angina pectoris: Secondary | ICD-10-CM | POA: Diagnosis not present

## 2023-07-15 ENCOUNTER — Encounter (INDEPENDENT_AMBULATORY_CARE_PROVIDER_SITE_OTHER): Payer: Self-pay | Admitting: Nurse Practitioner

## 2023-07-15 ENCOUNTER — Ambulatory Visit (INDEPENDENT_AMBULATORY_CARE_PROVIDER_SITE_OTHER): Payer: PPO | Admitting: Nurse Practitioner

## 2023-07-15 VITALS — BP 116/73 | HR 84 | Resp 18 | Ht 70.0 in | Wt 238.0 lb

## 2023-07-15 DIAGNOSIS — Z89511 Acquired absence of right leg below knee: Secondary | ICD-10-CM

## 2023-07-16 NOTE — Progress Notes (Signed)
 Subjective:    Patient ID: Gerald Ellis, male    DOB: [birth year], 58 y.o.   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  He subsequently developed a hematoma which required evacuation on 05/16/2023.  Today his wound is intact with some areas of scabbing.  It was closed with staples and sutures.  Currently his pain seems to be well-controlled.  It is controlled with pain medication prescribed by an outside provider.  He does note that he is fallen on his residual limb and bumped it as well.  He is healing fairly well with several scabbed over areas.  The amputation site itself is also decreasing in size and shape being much better with use of Ace bandage.    Review of Systems  Skin:  Positive for wound.  All other systems reviewed and are negative.      Objective:   Physical Exam Vitals reviewed.  HENT:     Head: Normocephalic.  Cardiovascular:     Rate and Rhythm: Normal rate.  Pulmonary:     Effort: Pulmonary effort is normal.  Musculoskeletal:     Right Lower Extremity: Right leg is amputated below knee.  Skin:    General: Skin is warm and dry.  Neurological:     Mental Status: He is alert and oriented to person, place, and time.  Psychiatric:        Mood and Affect: Mood normal.        Behavior: Behavior normal.        Thought Content: Thought content normal.        Judgment: Judgment normal.     BP 116/73   Pulse 84   Resp 18   Ht 5\' 10"  (1.778 m)   Wt 238 lb (108 kg)   BMI 34.15 kg/m   Past Medical History:  Diagnosis Date   Benign enlargement of prostate    Bronchitis    Childhood asthma    Diabetes mellitus without complication (HCC)    Environmental allergies    Erectile dysfunction    Headache    Hemorrhoids    Hypercholesteremia    Hypertension    Hypogonadism in male    Lumbago     Over weight     Social History   Socioeconomic History   Marital status: Married    Spouse name: Not on file   Number of children: Not on file   Years of education: Not on file   Highest education level: Not on file  Occupational History   Not on file  Tobacco Use   Smoking status: Never   Smokeless tobacco: Never  Substance and Sexual Activity   Alcohol use: Yes    Alcohol/week: 0.0 standard drinks of alcohol    Comment: occasionally   Drug use: No   Sexual activity: Not Currently  Other Topics Concern   Not on file  Social History Narrative   Not on file   Social Drivers of Health   Financial Resource Strain: High Risk (07/03/2023)   Received from [facility]   Overall Financial Resource Strain (CARDIA)    Difficulty of Paying Living Expenses: Hard  Food Insecurity: Food Insecurity Present (07/03/2023)   Received from [facility]   Hunger Vital Sign    Worried About Running Out of Food  in the Last Year: Often true    Ran Out of Food in the Last Year: Often true  Transportation Needs: No Transportation Needs (07/03/2023)   Received from [facility] - Transportation    In the past 12 months, has lack of transportation kept you from medical appointments or from getting medications?: No    Lack of Transportation (Non-Medical): No  Physical Activity: Insufficiently Active (04/20/2019)   Exercise Vital Sign    Days of Exercise per Week: 1 day    Minutes of Exercise per Session: 10 min  Stress: No Stress Concern Present (04/20/2019)   [employer] of Occupational Health - Occupational Stress Questionnaire    Feeling of Stress : Not at all  Social Connections: Moderately Isolated (04/20/2019)   Social Connection and Isolation Panel [NHANES]    Frequency of Communication with Friends and Family: Three times a week    Frequency of Social Gatherings with Friends and Family: Once a week    Attends Religious  Services: Never    Database administrator or Organizations: No    Attends Banker Meetings: Never    Marital Status: Married  Catering manager Violence: Not At Risk (05/08/2023)   Humiliation, Afraid, Rape, and Kick questionnaire    Fear of Current or Ex-Partner: No    Emotionally Abused: No    Physically Abused: No    Sexually Abused: No    Past Surgical History:  Procedure Laterality Date   AMPUTATION Right 05/15/2023   Procedure: AMPUTATION BELOW KNEE;  Surgeon: Annice Needy, MD;  Location: [facility] ORS;  Service: General;  Laterality: Right;   AMPUTATION Right 05/16/2023   Procedure: REVISION OF BELOW KNEE AMPUTATION;  Surgeon: Annice Needy, MD;  Location: [facility];  Service: General;  Laterality: Right;   BACK SURGERY  1992   CHOLECYSTECTOMY     COLONOSCOPY WITH PROPOFOL N/A 08/07/2015   Procedure: COLONOSCOPY WITH PROPOFOL;  Surgeon: Christena Deem, MD;  Location: [facility] ENDOSCOPY;  Service: Endoscopy;  Laterality: N/A;   COLONOSCOPY WITH PROPOFOL N/A 08/08/2015   Procedure: COLONOSCOPY WITH PROPOFOL;  Surgeon: Christena Deem, MD;  Location: [facility] ENDOSCOPY;  Service: Endoscopy;  Laterality: N/A;   COLONOSCOPY WITH PROPOFOL N/A 09/16/2017   Procedure: COLONOSCOPY WITH PROPOFOL;  Surgeon: Christena Deem, MD;  Location: [facility] ENDOSCOPY;  Service: Endoscopy;  Laterality: N/A;   COLONOSCOPY WITH PROPOFOL N/A 12/20/2020   Procedure: COLONOSCOPY WITH PROPOFOL;  Surgeon: Earline Mayotte, MD;  Location: [facility] ENDOSCOPY;  Service: Endoscopy;  Laterality: N/A;  IDDM   CORONARY STENT INTERVENTION N/A 04/20/2019   Procedure: CORONARY STENT INTERVENTION;  Surgeon: Marcina Millard, MD;  Location: [facility] INVASIVE CV LAB;  Service: Cardiovascular;  Laterality: N/A;   ESOPHAGOGASTRODUODENOSCOPY (EGD) WITH PROPOFOL N/A 08/07/2015   Procedure: ESOPHAGOGASTRODUODENOSCOPY (EGD) WITH PROPOFOL;  Surgeon: Christena Deem, MD;  Location: [facility] ENDOSCOPY;  Service: Endoscopy;  Laterality:  N/A;   ESOPHAGOGASTRODUODENOSCOPY (EGD) WITH PROPOFOL N/A 09/16/2017   Procedure: ESOPHAGOGASTRODUODENOSCOPY (EGD) WITH PROPOFOL;  Surgeon: Christena Deem, MD;  Location: [facility] ENDOSCOPY;  Service: Endoscopy;  Laterality: N/A;   ESOPHAGOGASTRODUODENOSCOPY (EGD) WITH PROPOFOL N/A 12/20/2020   Procedure: ESOPHAGOGASTRODUODENOSCOPY (EGD) WITH PROPOFOL;  Surgeon: Earline Mayotte, MD;  Location: [facility] ENDOSCOPY;  Service: Endoscopy;  Laterality: N/A;   IRRIGATION AND DEBRIDEMENT FOOT Right 05/12/2023   Procedure: IRRIGATION AND DEBRIDEMENT FOOT, WASH OUT;  Surgeon: Linus Galas, DPM;  Location: [facility] ORS;  Service: Orthopedics/Podiatry;  Laterality: Right;   LEFT  HEART CATH AND CORONARY ANGIOGRAPHY Left 04/20/2019   Procedure: LEFT HEART CATH AND CORONARY ANGIOGRAPHY;  Surgeon: Marcina Millard, MD;  Location: [facility] INVASIVE CV LAB;  Service: Cardiovascular;  Laterality: Left;   LOWER EXTREMITY ANGIOGRAPHY Right 05/09/2023   Procedure: Lower Extremity Angiography;  Surgeon: Renford Dills, MD;  Location: [facility] INVASIVE CV LAB;  Service: Cardiovascular;  Laterality: Right;   TRANSMETATARSAL AMPUTATION Right 05/08/2023   Procedure: TRANSMETATARSAL AMPUTATION;  Surgeon: Linus Galas, DPM;  Location: [facility] ORS;  Service: Orthopedics/Podiatry;  Laterality: Right;    Family History  Problem Relation Age of Onset   Heart disease Mother        CABG   Lung cancer Mother        Disseminated   Hypertension Mother    Hyperlipidemia Mother    Diabetes Father        mother   Colon cancer Father 24   Cancer Maternal Aunt        x3 aunts, unk types of cancer   Colon cancer Maternal Uncle        x3 uncles   Prostate cancer Maternal Uncle        x4 uncles   Cancer Paternal Uncle        prostate vs colon   Alzheimer's disease Other    Kidney disease Neg Hx     Allergies  Allergen Reactions   Ibuprofen Shortness Of Breath   Nsaids Swelling    Swelling of throat   Fish Oil Nausea Only, Other (See  Comments) and Nausea And Vomiting    Acid reflux       Latest Ref Rng & Units 05/22/2023    3:39 AM 05/21/2023    3:13 AM 05/20/2023    4:01 AM  CBC  WBC 4.0 - 10.5 K/uL 10.9  9.2  12.0   Hemoglobin 13.0 - 17.0 g/dL 8.4  8.1  7.8   Hematocrit 39.0 - 52.0 % 25.9  24.3  23.3   Platelets 150 - 400 K/uL 475  451  427       CMP     Component Value Date/Time   NA 136 05/22/2023 0339   K 4.1 05/22/2023 0339   CL 99 05/22/2023 0339   CO2 28 05/22/2023 0339   GLUCOSE 147 (H) 05/22/2023 0339   BUN 14 05/22/2023 0339   CREATININE 0.71 05/22/2023 0339   CALCIUM 8.6 (L) 05/22/2023 0339   PROT 8.3 (H) 05/07/2023 1921   ALBUMIN 2.6 (L) 05/07/2023 1921   AST 36 05/07/2023 1921   ALT 41 05/07/2023 1921   ALKPHOS 97 05/07/2023 1921   BILITOT 0.9 05/07/2023 1921   GFRNONAA >60 05/22/2023 0339     No results found.     Assessment & Plan:   1. S/P BKA (below knee amputation), right (HCC) (Primary) Will continue with wound treatment and wrapping with Ace bandage.  I am hopeful that scabbing will peel off over time.  I do believe it would be in his best interest to get a shrinker sock to also help with shaping of the area.     Current Outpatient Medications on File Prior to Visit  Medication Sig Dispense Refill   amLODipine (NORVASC) 5 MG tablet Take 1 tablet (5 mg total) by mouth daily. 30 tablet 11   aspirin 81 MG chewable tablet Chew 1 tablet (81 mg total) by mouth daily. 30 tablet 11   atorvastatin (LIPITOR) 80 MG tablet Take 1 tablet (80 mg total) by mouth daily  at 6 PM. 30 tablet 11   busPIRone (BUSPAR) 7.5 MG tablet Take 7.5 mg by mouth 2 (two) times daily.     cetirizine (ZYRTEC) 10 MG tablet Take 10 mg by mouth daily.     Continuous Glucose Sensor (FREESTYLE LIBRE 2 SENSOR) MISC USE 1 KIT EVERY 14 (FOURTEEN) DAYS FOR GLUCOSE MONITORING     diclofenac Sodium (VOLTAREN) 1 % GEL Apply 2 g topically 4 (four) times daily.     feeding supplement (ENSURE ENLIVE / ENSURE PLUS) LIQD  Take 237 mLs by mouth 2 (two) times daily between meals.     fluticasone (FLONASE) 50 MCG/ACT nasal spray Place 1 spray into both nostrils 2 (two) times daily as needed (nasal congestion).     Fluticasone-Umeclidin-Vilant 200-62.5-25 MCG/INH AEPB Inhale into the lungs.     gabapentin (NEURONTIN) 300 MG capsule Take 1 capsule (300 mg total) by mouth daily AND 2 capsules (600 mg total) at bedtime. 300 mg in the morning and 600 mg at bedtime.     HUMALOG KWIKPEN 200 UNIT/ML KwikPen Inject 22 units three times daily before meals.     HYDROcodone-acetaminophen (NORCO/VICODIN) 5-325 MG tablet Take 1-2 tablets by mouth every 4 (four) hours as needed for severe pain (pain score 7-10) or moderate pain (pain score 4-6). 30 tablet 0   insulin aspart (NOVOLOG) 100 UNIT/ML injection Inject 3 Units into the skin 3 (three) times daily with meals.     insulin aspart (NOVOLOG) 100 UNIT/ML injection Inject 0-20 Units into the skin every 4 (four) hours.     insulin degludec (TRESIBA FLEXTOUCH) 200 UNIT/ML FlexTouch Pen Inject 70 units nightly     insulin glargine-yfgn (SEMGLEE) 100 UNIT/ML injection Inject 0.4 mLs (40 Units total) into the skin at bedtime.     isosorbide mononitrate (IMDUR) 30 MG 24 hr tablet Take 30 mg by mouth daily.     losartan (COZAAR) 50 MG tablet Take 1 tablet (50 mg total) by mouth daily.     magnesium hydroxide (MILK OF MAGNESIA) 400 MG/5ML suspension Take 30 mLs by mouth at bedtime as needed for mild constipation or moderate constipation.     metFORMIN (GLUCOPHAGE) 1000 MG tablet Take 1,000 mg by mouth 2 (two) times daily.      metoCLOPramide (REGLAN) 5 MG tablet Take 1 tablet (5 mg total) by mouth every 8 (eight) hours as needed for nausea or vomiting.     metoprolol succinate (TOPROL-XL) 50 MG 24 hr tablet Take 50 mg by mouth daily. Take with or immediately following a meal.     Multiple Vitamins-Minerals (MULTIVITAMIN WITH MINERALS) tablet Take 1 tablet by mouth 2 (two) times daily.       mupirocin ointment (BACTROBAN) 2 % SMARTSIG:1 Application Topical 2-3 Times Daily     naloxone (NARCAN) nasal spray 4 mg/0.1 mL Place into the nose.     Omega-3 Fatty Acids (FISH OIL) 500 MG CAPS Take 500 mg by mouth 2 (two) times daily.     ondansetron (ZOFRAN-ODT) 4 MG disintegrating tablet Take 1 tablet (4 mg total) by mouth every 8 (eight) hours as needed for nausea or vomiting. 8 tablet 0   oxyCODONE-acetaminophen (PERCOCET) 10-325 MG tablet 1 tablet as needed Orally every 4 to 6 hrs for 30 days post-operative pain     pantoprazole (PROTONIX) 40 MG tablet Take 1 tablet (40 mg total) by mouth daily. 30 tablet 1   pioglitazone (ACTOS) 15 MG tablet Take 15 mg by mouth 2 (two) times daily.  polyethylene glycol (MIRALAX / GLYCOLAX) 17 g packet Take 17 g by mouth daily.     prasugrel (EFFIENT) 10 MG TABS tablet Take 1 tablet (10 mg total) by mouth daily. 30 tablet    sucralfate (CARAFATE) 1 GM/10ML suspension Take 1 g by mouth 4 (four) times daily -  with meals and at bedtime.     tadalafil (CIALIS) 20 MG tablet Take 20 mg by mouth daily as needed for erectile dysfunction.     montelukast (SINGULAIR) 10 MG tablet Take by mouth.     No current facility-administered medications on file prior to visit.    There are no Patient Instructions on file for this visit. No follow-ups on file.   [name], NP

## 2023-07-21 DIAGNOSIS — M1990 Unspecified osteoarthritis, unspecified site: Secondary | ICD-10-CM | POA: Diagnosis not present

## 2023-07-21 DIAGNOSIS — G894 Chronic pain syndrome: Secondary | ICD-10-CM | POA: Diagnosis not present

## 2023-07-21 DIAGNOSIS — M5030 Other cervical disc degeneration, unspecified cervical region: Secondary | ICD-10-CM | POA: Diagnosis not present

## 2023-07-21 DIAGNOSIS — G546 Phantom limb syndrome with pain: Secondary | ICD-10-CM | POA: Diagnosis not present

## 2023-07-21 DIAGNOSIS — Z79891 Long term (current) use of opiate analgesic: Secondary | ICD-10-CM | POA: Diagnosis not present

## 2023-07-22 DIAGNOSIS — H2513 Age-related nuclear cataract, bilateral: Secondary | ICD-10-CM | POA: Diagnosis not present

## 2023-07-22 DIAGNOSIS — E113313 Type 2 diabetes mellitus with moderate nonproliferative diabetic retinopathy with macular edema, bilateral: Secondary | ICD-10-CM | POA: Diagnosis not present

## 2023-07-22 DIAGNOSIS — H04123 Dry eye syndrome of bilateral lacrimal glands: Secondary | ICD-10-CM | POA: Diagnosis not present

## 2023-07-24 ENCOUNTER — Telehealth (INDEPENDENT_AMBULATORY_CARE_PROVIDER_SITE_OTHER): Payer: Self-pay

## 2023-07-24 NOTE — Telephone Encounter (Signed)
 Message given

## 2023-07-24 NOTE — Telephone Encounter (Signed)
 Harriett Sine called to inform us that there is a stable left in his amputation, also she stated that nurses have been putting xeroform over his suture line. She would like to know is that okay.   Please advise

## 2023-07-24 NOTE — Telephone Encounter (Signed)
 That's ok, It was probably under a scab.  We can take it out when he returns, or they can remove if they are able

## 2023-07-28 DIAGNOSIS — E785 Hyperlipidemia, unspecified: Secondary | ICD-10-CM | POA: Diagnosis not present

## 2023-07-28 DIAGNOSIS — Z794 Long term (current) use of insulin: Secondary | ICD-10-CM | POA: Diagnosis not present

## 2023-07-28 DIAGNOSIS — Z89511 Acquired absence of right leg below knee: Secondary | ICD-10-CM | POA: Diagnosis not present

## 2023-07-28 DIAGNOSIS — R809 Proteinuria, unspecified: Secondary | ICD-10-CM | POA: Diagnosis not present

## 2023-07-28 DIAGNOSIS — E113299 Type 2 diabetes mellitus with mild nonproliferative diabetic retinopathy without macular edema, unspecified eye: Secondary | ICD-10-CM | POA: Diagnosis not present

## 2023-07-28 DIAGNOSIS — I10 Essential (primary) hypertension: Secondary | ICD-10-CM | POA: Diagnosis not present

## 2023-07-28 DIAGNOSIS — E1143 Type 2 diabetes mellitus with diabetic autonomic (poly)neuropathy: Secondary | ICD-10-CM | POA: Diagnosis not present

## 2023-07-28 DIAGNOSIS — E1159 Type 2 diabetes mellitus with other circulatory complications: Secondary | ICD-10-CM | POA: Diagnosis not present

## 2023-07-28 DIAGNOSIS — E1129 Type 2 diabetes mellitus with other diabetic kidney complication: Secondary | ICD-10-CM | POA: Diagnosis not present

## 2023-07-28 DIAGNOSIS — E1165 Type 2 diabetes mellitus with hyperglycemia: Secondary | ICD-10-CM | POA: Diagnosis not present

## 2023-07-28 DIAGNOSIS — E1169 Type 2 diabetes mellitus with other specified complication: Secondary | ICD-10-CM | POA: Diagnosis not present

## 2023-07-31 DIAGNOSIS — I10 Essential (primary) hypertension: Secondary | ICD-10-CM | POA: Diagnosis not present

## 2023-07-31 DIAGNOSIS — E119 Type 2 diabetes mellitus without complications: Secondary | ICD-10-CM | POA: Diagnosis not present

## 2023-07-31 DIAGNOSIS — Z89511 Acquired absence of right leg below knee: Secondary | ICD-10-CM | POA: Diagnosis not present

## 2023-07-31 DIAGNOSIS — Z794 Long term (current) use of insulin: Secondary | ICD-10-CM | POA: Diagnosis not present

## 2023-07-31 DIAGNOSIS — Z955 Presence of coronary angioplasty implant and graft: Secondary | ICD-10-CM | POA: Diagnosis not present

## 2023-07-31 DIAGNOSIS — E7800 Pure hypercholesterolemia, unspecified: Secondary | ICD-10-CM | POA: Diagnosis not present

## 2023-07-31 DIAGNOSIS — I25118 Atherosclerotic heart disease of native coronary artery with other forms of angina pectoris: Secondary | ICD-10-CM | POA: Diagnosis not present

## 2023-08-05 ENCOUNTER — Encounter (INDEPENDENT_AMBULATORY_CARE_PROVIDER_SITE_OTHER): Payer: Self-pay | Admitting: Nurse Practitioner

## 2023-08-05 ENCOUNTER — Ambulatory Visit (INDEPENDENT_AMBULATORY_CARE_PROVIDER_SITE_OTHER): Payer: PPO | Admitting: Nurse Practitioner

## 2023-08-05 VITALS — BP 178/83 | HR 60 | Resp 16

## 2023-08-05 DIAGNOSIS — Z89511 Acquired absence of right leg below knee: Secondary | ICD-10-CM

## 2023-08-06 ENCOUNTER — Encounter (INDEPENDENT_AMBULATORY_CARE_PROVIDER_SITE_OTHER): Payer: Self-pay | Admitting: Nurse Practitioner

## 2023-08-06 NOTE — Progress Notes (Addendum)
 Subjective:    Patient ID: Gerald Ellis, male    DOB: [birth year], 58 y.o.   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  He subsequently developed a hematoma which required evacuation on 05/16/2023.  Today his wound is intact with some areas of scabbing.  It was closed with staples and sutures.  Currently his pain seems to be well-controlled.  It is controlled with pain medication prescribed by an outside provider.  He does note that he is fallen on his residual limb and bumped it as well.  He is healing fairly well with several scabbed over areas.  The amputation site itself is also decreasing in size and shape being much better with use of Ace bandage.    Review of Systems  Skin:  Positive for wound.  All other systems reviewed and are negative.      Objective:   Physical Exam Vitals reviewed.  HENT:     Head: Normocephalic.  Cardiovascular:     Rate and Rhythm: Normal rate.  Pulmonary:     Effort: Pulmonary effort is normal.  Musculoskeletal:     Right Lower Extremity: Right leg is amputated below knee.  Skin:    General: Skin is warm and dry.  Neurological:     Mental Status: He is alert and oriented to person, place, and time.  Psychiatric:        Mood and Affect: Mood normal.        Behavior: Behavior normal.        Thought Content: Thought content normal.        Judgment: Judgment normal.     BP (!) 178/83   Pulse 60   Resp 16   Past Medical History:  Diagnosis Date   Benign enlargement of prostate    Bronchitis    Childhood asthma    Diabetes mellitus without complication (HCC)    Environmental allergies    Erectile dysfunction    Headache    Hemorrhoids    Hypercholesteremia    Hypertension    Hypogonadism in male    Lumbago    Over weight     Social History   Socioeconomic History    Marital status: Married    Spouse name: Not on file   Number of children: Not on file   Years of education: Not on file   Highest education level: Not on file  Occupational History   Not on file  Tobacco Use   Smoking status: Never   Smokeless tobacco: Never  Substance and Sexual Activity   Alcohol use: Yes    Alcohol/week: 0.0 standard drinks of alcohol    Comment: occasionally   Drug use: No   Sexual activity: Not Currently  Other Topics Concern   Not on file  Social History Narrative   Not on file   Social Drivers of Health   Financial Resource Strain: High Risk (07/03/2023)   Received from [facility]   Overall Financial Resource Strain (CARDIA)    Difficulty of Paying Living Expenses: Hard  Food Insecurity: Food Insecurity Present (07/03/2023)   Received from [facility]   Hunger Vital Sign    Worried About Running Out of Food in the Last Year: Often true    Ran Out of Food in the Last Year: Often true  Transportation Needs: No Transportation Needs (07/03/2023)   Received from [facility] - Transportation    In the past 12 months, has lack of transportation kept you from medical appointments or from getting medications?: No    Lack of Transportation (Non-Medical): No  Physical Activity: Insufficiently Active (04/20/2019)   Exercise Vital Sign    Days of Exercise per Week: 1 day    Minutes of Exercise per Session: 10 min  Stress: No Stress Concern Present (04/20/2019)   [employer] of Occupational Health - Occupational Stress Questionnaire    Feeling of Stress : Not at all  Social Connections: Moderately Isolated (04/20/2019)   Social Connection and Isolation Panel [NHANES]    Frequency of Communication with Friends and Family: Three times a week    Frequency of Social Gatherings with Friends and Family: Once a week    Attends Religious Services: Never    Database administrator or Organizations: No     Attends Banker Meetings: Never    Marital Status: Married  Catering manager Violence: Not At Risk (05/08/2023)   Humiliation, Afraid, Rape, and Kick questionnaire    Fear of Current or Ex-Partner: No    Emotionally Abused: No    Physically Abused: No    Sexually Abused: No    Past Surgical History:  Procedure Laterality Date   AMPUTATION Right 05/15/2023   Procedure: AMPUTATION BELOW KNEE;  Surgeon: Marea Selinda RAMAN, MD;  Location: [facility] ORS;  Service: General;  Laterality: Right;   AMPUTATION Right 05/16/2023   Procedure: REVISION OF BELOW KNEE AMPUTATION;  Surgeon: Marea Selinda RAMAN, MD;  Location: [facility];  Service: General;  Laterality: Right;   BACK SURGERY  1992   CHOLECYSTECTOMY     COLONOSCOPY WITH PROPOFOL  N/A 08/07/2015   Procedure: COLONOSCOPY WITH PROPOFOL ;  Surgeon: Gladis RAYMOND Mariner, MD;  Location: [facility] ENDOSCOPY;  Service: Endoscopy;  Laterality: N/A;   COLONOSCOPY WITH PROPOFOL  N/A 08/08/2015   Procedure: COLONOSCOPY WITH PROPOFOL ;  Surgeon: Gladis RAYMOND Mariner, MD;  Location: [facility] ENDOSCOPY;  Service: Endoscopy;  Laterality: N/A;   COLONOSCOPY WITH PROPOFOL  N/A 09/16/2017   Procedure: COLONOSCOPY WITH PROPOFOL ;  Surgeon: Mariner Gladis RAYMOND, MD;  Location: [facility] ENDOSCOPY;  Service: Endoscopy;  Laterality: N/A;   COLONOSCOPY WITH PROPOFOL  N/A 12/20/2020   Procedure: COLONOSCOPY WITH PROPOFOL ;  Surgeon: Dessa Reyes ORN, MD;  Location: [facility] ENDOSCOPY;  Service: Endoscopy;  Laterality: N/A;  IDDM   CORONARY STENT INTERVENTION N/A 04/20/2019   Procedure: CORONARY STENT INTERVENTION;  Surgeon: Ammon Blunt, MD;  Location: [facility] INVASIVE CV LAB;  Service: Cardiovascular;  Laterality: N/A;   ESOPHAGOGASTRODUODENOSCOPY (EGD) WITH PROPOFOL  N/A 08/07/2015   Procedure: ESOPHAGOGASTRODUODENOSCOPY (EGD) WITH PROPOFOL ;  Surgeon: Gladis RAYMOND Mariner, MD;  Location: [facility] ENDOSCOPY;  Service: Endoscopy;  Laterality: N/A;   ESOPHAGOGASTRODUODENOSCOPY (EGD) WITH PROPOFOL  N/A 09/16/2017    Procedure: ESOPHAGOGASTRODUODENOSCOPY (EGD) WITH PROPOFOL ;  Surgeon: Mariner Gladis RAYMOND, MD;  Location: [facility] ENDOSCOPY;  Service: Endoscopy;  Laterality: N/A;   ESOPHAGOGASTRODUODENOSCOPY (EGD) WITH PROPOFOL  N/A 12/20/2020   Procedure: ESOPHAGOGASTRODUODENOSCOPY (EGD) WITH PROPOFOL ;  Surgeon: Dessa Reyes ORN, MD;  Location: [facility] ENDOSCOPY;  Service: Endoscopy;  Laterality: N/A;   IRRIGATION AND DEBRIDEMENT FOOT Right 05/12/2023   Procedure: IRRIGATION AND DEBRIDEMENT FOOT, WASH OUT;  Surgeon: Neill Boas, DPM;  Location: [facility] ORS;  Service: Orthopedics/Podiatry;  Laterality: Right;   LEFT HEART CATH AND CORONARY ANGIOGRAPHY Left 04/20/2019   Procedure: LEFT HEART CATH AND CORONARY ANGIOGRAPHY;  Surgeon: Ammon Blunt,  MD;  Location: [facility] INVASIVE CV LAB;  Service: Cardiovascular;  Laterality: Left;   LOWER EXTREMITY ANGIOGRAPHY Right 05/09/2023   Procedure: Lower Extremity Angiography;  Surgeon: Jama Cordella MATSU, MD;  Location: [facility] INVASIVE CV LAB;  Service: Cardiovascular;  Laterality: Right;   TRANSMETATARSAL AMPUTATION Right 05/08/2023   Procedure: TRANSMETATARSAL AMPUTATION;  Surgeon: Neill Boas, DPM;  Location: [facility] ORS;  Service: Orthopedics/Podiatry;  Laterality: Right;    Family History  Problem Relation Age of Onset   Heart disease Mother        CABG   Lung cancer Mother        Disseminated   Hypertension Mother    Hyperlipidemia Mother    Diabetes Father        mother   Colon cancer Father 68   Cancer Maternal Aunt        x3 aunts, unk types of cancer   Colon cancer Maternal Uncle        x3 uncles   Prostate cancer Maternal Uncle        x4 uncles   Cancer Paternal Uncle        prostate vs colon   Alzheimer's disease Other    Kidney disease Neg Hx     Allergies  Allergen Reactions   Ibuprofen Shortness Of Breath   Nsaids Swelling    Swelling of throat   Fish Oil Nausea Only, Other (See Comments) and Nausea And Vomiting    Acid reflux       Latest  Ref Rng & Units 05/22/2023    3:39 AM 05/21/2023    3:13 AM 05/20/2023    4:01 AM  CBC  WBC 4.0 - 10.5 K/uL 10.9  9.2  12.0   Hemoglobin 13.0 - 17.0 g/dL 8.4  8.1  7.8   Hematocrit 39.0 - 52.0 % 25.9  24.3  23.3   Platelets 150 - 400 K/uL 475  451  427       CMP     Component Value Date/Time   NA 136 05/22/2023 0339   K 4.1 05/22/2023 0339   CL 99 05/22/2023 0339   CO2 28 05/22/2023 0339   GLUCOSE 147 (H) 05/22/2023 0339   BUN 14 05/22/2023 0339   CREATININE 0.71 05/22/2023 0339   CALCIUM  8.6 (L) 05/22/2023 0339   PROT 8.3 (H) 05/07/2023 1921   ALBUMIN 2.6 (L) 05/07/2023 1921   AST 36 05/07/2023 1921   ALT 41 05/07/2023 1921   ALKPHOS 97 05/07/2023 1921   BILITOT 0.9 05/07/2023 1921   GFRNONAA >60 05/22/2023 0339     No results found.     Assessment & Plan:   1. S/P BKA (below knee amputation), right (HCC) (Primary) The patient has expressed a sincere and further desire to ambulate with a prosthesis.  He is currently a K2 functional level.  He does agree to continue with use of prosthetic and to continue working with [facility].  At this time his limb is sufficiently healed for receipt of her prosthesis.  It is hoped that he will continue to be able to accomplish the ADLs he was able to do prior to amputation including, bathing, toileting, dressing, shopping and hopefully eventually returning back to some sort of gainful employment.  He does currently suffer with some arthralgias that may affect his ability to use a prosthesis but I do not think that it would preclude him from using it altogether.    Current Outpatient Medications on File Prior to Visit  Medication  Sig Dispense Refill   amLODipine  (NORVASC ) 5 MG tablet Take 1 tablet (5 mg total) by mouth daily. 30 tablet 11   aspirin  81 MG chewable tablet Chew 1 tablet (81 mg total) by mouth daily. 30 tablet 11   atorvastatin  (LIPITOR ) 80 MG tablet Take 1 tablet (80 mg total) by mouth daily at 6 PM. 30 tablet 11    busPIRone  (BUSPAR ) 7.5 MG tablet Take 7.5 mg by mouth 2 (two) times daily.     cetirizine (ZYRTEC) 10 MG tablet Take 10 mg by mouth daily.     Continuous Glucose Sensor (FREESTYLE LIBRE 2 SENSOR) MISC USE 1 KIT EVERY 14 (FOURTEEN) DAYS FOR GLUCOSE MONITORING     diclofenac Sodium (VOLTAREN) 1 % GEL Apply 2 g topically 4 (four) times daily.     feeding supplement (ENSURE ENLIVE / ENSURE PLUS) LIQD Take 237 mLs by mouth 2 (two) times daily between meals.     fluticasone (FLONASE) 50 MCG/ACT nasal spray Place 1 spray into both nostrils 2 (two) times daily as needed (nasal congestion).     Fluticasone-Umeclidin-Vilant 200-62.5-25 MCG/INH AEPB Inhale into the lungs.     gabapentin  (NEURONTIN ) 300 MG capsule Take 1 capsule (300 mg total) by mouth daily AND 2 capsules (600 mg total) at bedtime. 300 mg in the morning and 600 mg at bedtime.     HUMALOG KWIKPEN 200 UNIT/ML KwikPen Inject 22 units three times daily before meals.     HYDROcodone -acetaminophen  (NORCO/VICODIN) 5-325 MG tablet Take 1-2 tablets by mouth every 4 (four) hours as needed for severe pain (pain score 7-10) or moderate pain (pain score 4-6). 30 tablet 0   insulin  aspart (NOVOLOG ) 100 UNIT/ML injection Inject 3 Units into the skin 3 (three) times daily with meals.     insulin  aspart (NOVOLOG ) 100 UNIT/ML injection Inject 0-20 Units into the skin every 4 (four) hours.     insulin  degludec (TRESIBA FLEXTOUCH) 200 UNIT/ML FlexTouch Pen Inject 70 units nightly     insulin  glargine-yfgn (SEMGLEE ) 100 UNIT/ML injection Inject 0.4 mLs (40 Units total) into the skin at bedtime.     isosorbide  mononitrate (IMDUR ) 30 MG 24 hr tablet Take 30 mg by mouth daily.     losartan  (COZAAR ) 50 MG tablet Take 1 tablet (50 mg total) by mouth daily.     magnesium  hydroxide (MILK OF MAGNESIA) 400 MG/5ML suspension Take 30 mLs by mouth at bedtime as needed for mild constipation or moderate constipation.     metFORMIN (GLUCOPHAGE) 1000 MG tablet Take 1,000 mg by  mouth 2 (two) times daily.      metoCLOPramide  (REGLAN ) 5 MG tablet Take 1 tablet (5 mg total) by mouth every 8 (eight) hours as needed for nausea or vomiting.     metoprolol  succinate (TOPROL -XL) 50 MG 24 hr tablet Take 50 mg by mouth daily. Take with or immediately following a meal.     Multiple Vitamins-Minerals (MULTIVITAMIN WITH MINERALS) tablet Take 1 tablet by mouth 2 (two) times daily.      mupirocin  ointment (BACTROBAN ) 2 % SMARTSIG:1 Application Topical 2-3 Times Daily     naloxone (NARCAN) nasal spray 4 mg/0.1 mL Place into the nose.     Omega-3 Fatty Acids (FISH OIL) 500 MG CAPS Take 500 mg by mouth 2 (two) times daily.     ondansetron  (ZOFRAN -ODT) 4 MG disintegrating tablet Take 1 tablet (4 mg total) by mouth every 8 (eight) hours as needed for nausea or vomiting. 8 tablet 0   pantoprazole  (PROTONIX )  40 MG tablet Take 1 tablet (40 mg total) by mouth daily. 30 tablet 1   pioglitazone  (ACTOS ) 15 MG tablet Take 15 mg by mouth 2 (two) times daily.     polyethylene glycol (MIRALAX  / GLYCOLAX ) 17 g packet Take 17 g by mouth daily.     prasugrel  (EFFIENT ) 10 MG TABS tablet Take 1 tablet (10 mg total) by mouth daily. 30 tablet    sucralfate (CARAFATE) 1 GM/10ML suspension Take 1 g by mouth 4 (four) times daily -  with meals and at bedtime.     tadalafil (CIALIS) 20 MG tablet Take 20 mg by mouth daily as needed for erectile dysfunction.     montelukast (SINGULAIR) 10 MG tablet Take by mouth.     No current facility-administered medications on file prior to visit.    There are no Patient Instructions on file for this visit. No follow-ups on file.   Yasaman Kolek E Juni Glaab, NP

## 2023-08-08 DIAGNOSIS — Z4781 Encounter for orthopedic aftercare following surgical amputation: Secondary | ICD-10-CM | POA: Diagnosis not present

## 2023-08-08 DIAGNOSIS — J45909 Unspecified asthma, uncomplicated: Secondary | ICD-10-CM | POA: Diagnosis not present

## 2023-08-08 DIAGNOSIS — Z89511 Acquired absence of right leg below knee: Secondary | ICD-10-CM | POA: Diagnosis not present

## 2023-08-08 DIAGNOSIS — E1142 Type 2 diabetes mellitus with diabetic polyneuropathy: Secondary | ICD-10-CM | POA: Diagnosis not present

## 2023-08-08 DIAGNOSIS — I872 Venous insufficiency (chronic) (peripheral): Secondary | ICD-10-CM | POA: Diagnosis not present

## 2023-08-08 DIAGNOSIS — E1151 Type 2 diabetes mellitus with diabetic peripheral angiopathy without gangrene: Secondary | ICD-10-CM | POA: Diagnosis not present

## 2023-08-08 DIAGNOSIS — Z794 Long term (current) use of insulin: Secondary | ICD-10-CM | POA: Diagnosis not present

## 2023-08-27 DIAGNOSIS — Z4781 Encounter for orthopedic aftercare following surgical amputation: Secondary | ICD-10-CM | POA: Diagnosis not present

## 2023-08-27 DIAGNOSIS — Z6831 Body mass index (BMI) 31.0-31.9, adult: Secondary | ICD-10-CM | POA: Diagnosis not present

## 2023-08-27 DIAGNOSIS — J45909 Unspecified asthma, uncomplicated: Secondary | ICD-10-CM | POA: Diagnosis not present

## 2023-08-27 DIAGNOSIS — Z794 Long term (current) use of insulin: Secondary | ICD-10-CM | POA: Diagnosis not present

## 2023-08-27 DIAGNOSIS — Z89511 Acquired absence of right leg below knee: Secondary | ICD-10-CM | POA: Diagnosis not present

## 2023-08-27 DIAGNOSIS — E1142 Type 2 diabetes mellitus with diabetic polyneuropathy: Secondary | ICD-10-CM | POA: Diagnosis not present

## 2023-09-02 ENCOUNTER — Ambulatory Visit (INDEPENDENT_AMBULATORY_CARE_PROVIDER_SITE_OTHER): Admitting: Vascular Surgery

## 2023-09-02 ENCOUNTER — Encounter (INDEPENDENT_AMBULATORY_CARE_PROVIDER_SITE_OTHER): Payer: Self-pay | Admitting: Vascular Surgery

## 2023-09-02 VITALS — BP 164/93 | HR 90 | Resp 18 | Ht 69.0 in | Wt 238.0 lb

## 2023-09-02 DIAGNOSIS — I10 Essential (primary) hypertension: Secondary | ICD-10-CM | POA: Diagnosis not present

## 2023-09-02 DIAGNOSIS — E119 Type 2 diabetes mellitus without complications: Secondary | ICD-10-CM

## 2023-09-02 DIAGNOSIS — Z89511 Acquired absence of right leg below knee: Secondary | ICD-10-CM | POA: Insufficient documentation

## 2023-09-02 DIAGNOSIS — I739 Peripheral vascular disease, unspecified: Secondary | ICD-10-CM

## 2023-09-02 NOTE — Assessment & Plan Note (Signed)
 Now s/p right BKA. Check left ABIs in 3-4 months.

## 2023-09-02 NOTE — Progress Notes (Signed)
 MRN : [id number]  Gerald Ellis is a 58 y.o. (1966/02/20) male who presents with chief complaint of  Chief Complaint  Patient presents with   Follow-up    fu 4 weeks no studies  .  History of Present Illness: Patient returns today in follow up of his amputation site.  He is about 4 months s/p right BKA.  He did have evacuation of hematoma postoperatively but overall did well.  He has had a fall at home and had some mild openings but his wounds are now almost entirely healed with only a few small scabs.  No erythema or drainage.  Still has phantom symptoms, but these are not that bothersome.  Overall doing well.  Current Outpatient Medications  Medication Sig Dispense Refill   amLODipine (NORVASC) 5 MG tablet Take 1 tablet (5 mg total) by mouth daily. 30 tablet 11   aspirin 81 MG chewable tablet Chew 1 tablet (81 mg total) by mouth daily. 30 tablet 11   atorvastatin (LIPITOR) 80 MG tablet Take 1 tablet (80 mg total) by mouth daily at 6 PM. 30 tablet 11   busPIRone (BUSPAR) 7.5 MG tablet Take 7.5 mg by mouth 2 (two) times daily.     cetirizine (ZYRTEC) 10 MG tablet Take 10 mg by mouth daily.     Continuous Glucose Sensor (FREESTYLE LIBRE 2 SENSOR) MISC USE 1 KIT EVERY 14 (FOURTEEN) DAYS FOR GLUCOSE MONITORING     diclofenac Sodium (VOLTAREN) 1 % GEL Apply 2 g topically 4 (four) times daily.     feeding supplement (ENSURE ENLIVE / ENSURE PLUS) LIQD Take 237 mLs by mouth 2 (two) times daily between meals.     fluticasone (FLONASE) 50 MCG/ACT nasal spray Place 1 spray into both nostrils 2 (two) times daily as needed (nasal congestion).     Fluticasone-Umeclidin-Vilant 200-62.5-25 MCG/INH AEPB Inhale into the lungs.     gabapentin (NEURONTIN) 300 MG capsule Take 1 capsule (300 mg total) by mouth daily AND 2 capsules (600 mg total) at bedtime. 300 mg in the morning and 600 mg at bedtime.     HUMALOG KWIKPEN 200 UNIT/ML KwikPen Inject 22 units three times daily before meals.      HYDROcodone-acetaminophen (NORCO/VICODIN) 5-325 MG tablet Take 1-2 tablets by mouth every 4 (four) hours as needed for severe pain (pain score 7-10) or moderate pain (pain score 4-6). 30 tablet 0   insulin aspart (NOVOLOG) 100 UNIT/ML injection Inject 3 Units into the skin 3 (three) times daily with meals.     insulin aspart (NOVOLOG) 100 UNIT/ML injection Inject 0-20 Units into the skin every 4 (four) hours.     insulin degludec (TRESIBA FLEXTOUCH) 200 UNIT/ML FlexTouch Pen Inject 70 units nightly     insulin glargine-yfgn (SEMGLEE) 100 UNIT/ML injection Inject 0.4 mLs (40 Units total) into the skin at bedtime.     isosorbide mononitrate (IMDUR) 30 MG 24 hr tablet Take 30 mg by mouth daily.     losartan (COZAAR) 50 MG tablet Take 1 tablet (50 mg total) by mouth daily.     magnesium hydroxide (MILK OF MAGNESIA) 400 MG/5ML suspension Take 30 mLs by mouth at bedtime as needed for mild constipation or moderate constipation.     metFORMIN (GLUCOPHAGE) 1000 MG tablet Take 1,000 mg by mouth 2 (two) times daily.      metoCLOPramide (REGLAN) 5 MG tablet Take 1 tablet (5 mg total) by mouth every 8 (eight) hours as needed for nausea or vomiting.  metoprolol succinate (TOPROL-XL) 50 MG 24 hr tablet Take 50 mg by mouth daily. Take with or immediately following a meal.     Multiple Vitamins-Minerals (MULTIVITAMIN WITH MINERALS) tablet Take 1 tablet by mouth 2 (two) times daily.      mupirocin ointment (BACTROBAN) 2 % SMARTSIG:1 Application Topical 2-3 Times Daily     naloxone (NARCAN) nasal spray 4 mg/0.1 mL Place into the nose.     Omega-3 Fatty Acids (FISH OIL) 500 MG CAPS Take 500 mg by mouth 2 (two) times daily.     ondansetron (ZOFRAN-ODT) 4 MG disintegrating tablet Take 1 tablet (4 mg total) by mouth every 8 (eight) hours as needed for nausea or vomiting. 8 tablet 0   oxyCODONE-acetaminophen (PERCOCET) 10-325 MG tablet Take 1 tablet by mouth.     pantoprazole (PROTONIX) 40 MG tablet Take 1 tablet (40 mg  total) by mouth daily. 30 tablet 1   pioglitazone (ACTOS) 15 MG tablet Take 15 mg by mouth 2 (two) times daily.     polyethylene glycol (MIRALAX / GLYCOLAX) 17 g packet Take 17 g by mouth daily.     prasugrel (EFFIENT) 10 MG TABS tablet Take 1 tablet (10 mg total) by mouth daily. 30 tablet    sucralfate (CARAFATE) 1 GM/10ML suspension Take 1 g by mouth 4 (four) times daily -  with meals and at bedtime.     tadalafil (CIALIS) 20 MG tablet Take 20 mg by mouth daily as needed for erectile dysfunction.     montelukast (SINGULAIR) 10 MG tablet Take by mouth.     No current facility-administered medications for this visit.    Past Medical History:  Diagnosis Date   Benign enlargement of prostate    Bronchitis    Childhood asthma    Diabetes mellitus without complication (HCC)    Environmental allergies    Erectile dysfunction    Headache    Hemorrhoids    Hypercholesteremia    Hypertension    Hypogonadism in male    Lumbago    Over weight     Past Surgical History:  Procedure Laterality Date   AMPUTATION Right 05/15/2023   Procedure: AMPUTATION BELOW KNEE;  Surgeon: Annice Needy, MD;  Location: [facility] ORS;  Service: General;  Laterality: Right;   AMPUTATION Right 05/16/2023   Procedure: REVISION OF BELOW KNEE AMPUTATION;  Surgeon: Annice Needy, MD;  Location: [facility];  Service: General;  Laterality: Right;   BACK SURGERY  1992   CHOLECYSTECTOMY     COLONOSCOPY WITH PROPOFOL N/A 08/07/2015   Procedure: COLONOSCOPY WITH PROPOFOL;  Surgeon: Christena Deem, MD;  Location: [facility] ENDOSCOPY;  Service: Endoscopy;  Laterality: N/A;   COLONOSCOPY WITH PROPOFOL N/A 08/08/2015   Procedure: COLONOSCOPY WITH PROPOFOL;  Surgeon: Christena Deem, MD;  Location: [facility] ENDOSCOPY;  Service: Endoscopy;  Laterality: N/A;   COLONOSCOPY WITH PROPOFOL N/A 09/16/2017   Procedure: COLONOSCOPY WITH PROPOFOL;  Surgeon: Christena Deem, MD;  Location: [facility] ENDOSCOPY;  Service: Endoscopy;  Laterality: N/A;    COLONOSCOPY WITH PROPOFOL N/A 12/20/2020   Procedure: COLONOSCOPY WITH PROPOFOL;  Surgeon: Earline Mayotte, MD;  Location: [facility] ENDOSCOPY;  Service: Endoscopy;  Laterality: N/A;  IDDM   CORONARY STENT INTERVENTION N/A 04/20/2019   Procedure: CORONARY STENT INTERVENTION;  Surgeon: Marcina Millard, MD;  Location: [facility] INVASIVE CV LAB;  Service: Cardiovascular;  Laterality: N/A;   ESOPHAGOGASTRODUODENOSCOPY (EGD) WITH PROPOFOL N/A 08/07/2015   Procedure: ESOPHAGOGASTRODUODENOSCOPY (EGD) WITH PROPOFOL;  Surgeon: Christena Deem, MD;  Location: [facility] ENDOSCOPY;  Service: Endoscopy;  Laterality: N/A;   ESOPHAGOGASTRODUODENOSCOPY (EGD) WITH PROPOFOL N/A 09/16/2017   Procedure: ESOPHAGOGASTRODUODENOSCOPY (EGD) WITH PROPOFOL;  Surgeon: Christena Deem, MD;  Location: [facility] ENDOSCOPY;  Service: Endoscopy;  Laterality: N/A;   ESOPHAGOGASTRODUODENOSCOPY (EGD) WITH PROPOFOL N/A 12/20/2020   Procedure: ESOPHAGOGASTRODUODENOSCOPY (EGD) WITH PROPOFOL;  Surgeon: Earline Mayotte, MD;  Location: [facility] ENDOSCOPY;  Service: Endoscopy;  Laterality: N/A;   IRRIGATION AND DEBRIDEMENT FOOT Right 05/12/2023   Procedure: IRRIGATION AND DEBRIDEMENT FOOT, WASH OUT;  Surgeon: Linus Galas, DPM;  Location: [facility] ORS;  Service: Orthopedics/Podiatry;  Laterality: Right;   LEFT HEART CATH AND CORONARY ANGIOGRAPHY Left 04/20/2019   Procedure: LEFT HEART CATH AND CORONARY ANGIOGRAPHY;  Surgeon: Marcina Millard, MD;  Location: [facility];  Service: Cardiovascular;  Laterality: Left;   LOWER EXTREMITY ANGIOGRAPHY Right 05/09/2023   Procedure: Lower Extremity Angiography;  Surgeon: Renford Dills, MD;  Location: [facility];  Service: Cardiovascular;  Laterality: Right;   TRANSMETATARSAL AMPUTATION Right 05/08/2023   Procedure: TRANSMETATARSAL AMPUTATION;  Surgeon: Linus Galas, DPM;  Location: [facility];  Service: Orthopedics/Podiatry;  Laterality: Right;     Social History   Tobacco Use   Smoking  status: Never   Smokeless tobacco: Never  Substance Use Topics   Alcohol use: Yes    Alcohol/week: 0.0 standard drinks of alcohol    Comment: occasionally   Drug use: No       Family History  Problem Relation Age of Onset   Heart disease Mother        CABG   Lung cancer Mother        Disseminated   Hypertension Mother    Hyperlipidemia Mother    Diabetes Father        mother   Colon cancer Father 41   Cancer Maternal Aunt        x3 aunts, unk types of cancer   Colon cancer Maternal Uncle        x3 uncles   Prostate cancer Maternal Uncle        x4 uncles   Cancer Paternal Uncle        prostate vs colon   Alzheimer's disease Other    Kidney disease Neg Hx      Allergies  Allergen Reactions   Ibuprofen Shortness Of Breath   Nsaids Swelling    Swelling of throat   Fish Oil Nausea Only, Other (See Comments) and Nausea And Vomiting    Acid reflux     REVIEW OF SYSTEMS (Negative unless checked)  Constitutional: [] Weight loss  [] Fever  [] Chills Cardiac: [] Chest pain   [] Chest pressure   [] Palpitations   [] Shortness of breath when laying flat   [] Shortness of breath at rest   [] Shortness of breath with exertion. Vascular:  [] Pain in legs with walking   [] Pain in legs at rest   [] Pain in legs when laying flat   [] Claudication   [] Pain in feet when walking  [] Pain in feet at rest  [] Pain in feet when laying flat   [] History of DVT   [] Phlebitis   [] Swelling in legs   [] Varicose veins   [] Non-healing ulcers Pulmonary:   [] Uses home oxygen   [] Productive cough   [] Hemoptysis   [] Wheeze  [] COPD   [] Asthma Neurologic:  [] Dizziness  [] Blackouts   [] Seizures   [] History of stroke   [] History of TIA  [] Aphasia   [] Temporary blindness   [] Dysphagia   [] Weakness or numbness  in arms   [x] Weakness or numbness in legs Musculoskeletal:  [] Arthritis   [] Joint swelling   [] Joint pain   [x] Low back pain Hematologic:  [] Easy bruising  [] Easy bleeding   [] Hypercoagulable state   [] Anemic    Gastrointestinal:  [] Blood in stool   [] Vomiting blood  [] Gastroesophageal reflux/heartburn   [] Abdominal pain Genitourinary:  [] Chronic kidney disease   [] Difficult urination  [] Frequent urination  [] Burning with urination   [] Hematuria Skin:  [] Rashes   [x] Ulcers   [x] Wounds Psychological:  [] History of anxiety   []  History of major depression.  Physical Examination  BP (!) 164/93   Pulse 90   Resp 18   Ht 5\' 9"  (1.753 m)   Wt 238 lb (108 kg)   BMI 35.15 kg/m  Gen:  WD/WN, NAD Head: [place]/AT, No temporalis wasting. Ear/Nose/Throat: Hearing grossly intact, nares w/o erythema or drainage Eyes: Conjunctiva clear. Sclera non-icteric Neck: Supple.  Trachea midline Pulmonary:  Good air movement, no use of accessory muscles.  Cardiac: RRR, no JVD Vascular:  Vessel Right Left  Radial Palpable Palpable                          PT Not Palpable 1+ Palpable  DP Not Palpable 1+ Palpable   Gastrointestinal: soft, non-tender/non-distended. No guarding/reflex.  Musculoskeletal: M/S 5/5 throughout.  No deformity or atrophy.  Right BKA has 3 small subcentimeter circular scabs.  No erythema or drainage.  No significant lower extremity edema. Neurologic: Sensation grossly intact in extremities.  Symmetrical.  Speech is fluent.  Psychiatric: Judgment intact, Mood & affect appropriate for pt's clinical situation. Dermatologic: Right BKA site as above.      Labs No results found for this or any previous visit (from the past 2160 hours).  Radiology No results found.  Assessment/Plan  PAD (peripheral artery disease) (HCC) Now s/p right BKA. Check left ABIs in 3-4 months.   HTN (hypertension) blood pressure control important in reducing the progression of atherosclerotic disease. On appropriate oral medications.   Diabetes mellitus type 2, uncomplicated (HCC) blood glucose control important in reducing the progression of atherosclerotic disease. Also, involved in wound healing. On  appropriate medications.   Hx of BKA, right (HCC) At this point, he can begin wearing a stump shrinker and a referral to the [facility] has been made.  Hopefully he can get his prosthesis.  He will be following up with us  in 3 to 4 months.    Mikki Alexander, MD  09/02/2023 5:56 PM    This note was created with Dragon medical transcription system.  Any errors from dictation are purely unintentional

## 2023-09-02 NOTE — Assessment & Plan Note (Signed)
 blood pressure control important in reducing the progression of atherosclerotic disease. On appropriate oral medications.

## 2023-09-02 NOTE — Assessment & Plan Note (Signed)
 blood glucose control important in reducing the progression of atherosclerotic disease. Also, involved in wound healing. On appropriate medications.

## 2023-09-02 NOTE — Assessment & Plan Note (Signed)
 At this point, he can begin wearing a stump shrinker and a referral to the [facility] [facility] has been made.  Hopefully he can get his prosthesis.  He will be following up with us  in 3 to 4 months.

## 2023-09-04 DIAGNOSIS — E1151 Type 2 diabetes mellitus with diabetic peripheral angiopathy without gangrene: Secondary | ICD-10-CM | POA: Diagnosis not present

## 2023-09-04 DIAGNOSIS — I2510 Atherosclerotic heart disease of native coronary artery without angina pectoris: Secondary | ICD-10-CM | POA: Diagnosis not present

## 2023-09-04 DIAGNOSIS — Z89511 Acquired absence of right leg below knee: Secondary | ICD-10-CM | POA: Diagnosis not present

## 2023-09-04 DIAGNOSIS — D649 Anemia, unspecified: Secondary | ICD-10-CM | POA: Diagnosis not present

## 2023-09-04 DIAGNOSIS — I10 Essential (primary) hypertension: Secondary | ICD-10-CM | POA: Diagnosis not present

## 2023-09-04 DIAGNOSIS — J4540 Moderate persistent asthma, uncomplicated: Secondary | ICD-10-CM | POA: Diagnosis not present

## 2023-09-04 DIAGNOSIS — E1165 Type 2 diabetes mellitus with hyperglycemia: Secondary | ICD-10-CM | POA: Diagnosis not present

## 2023-09-15 DIAGNOSIS — M5030 Other cervical disc degeneration, unspecified cervical region: Secondary | ICD-10-CM | POA: Diagnosis not present

## 2023-09-15 DIAGNOSIS — G546 Phantom limb syndrome with pain: Secondary | ICD-10-CM | POA: Diagnosis not present

## 2023-09-15 DIAGNOSIS — Z5181 Encounter for therapeutic drug level monitoring: Secondary | ICD-10-CM | POA: Diagnosis not present

## 2023-09-15 DIAGNOSIS — M1990 Unspecified osteoarthritis, unspecified site: Secondary | ICD-10-CM | POA: Diagnosis not present

## 2023-09-15 DIAGNOSIS — G894 Chronic pain syndrome: Secondary | ICD-10-CM | POA: Diagnosis not present

## 2023-09-15 DIAGNOSIS — Z79891 Long term (current) use of opiate analgesic: Secondary | ICD-10-CM | POA: Diagnosis not present

## 2023-09-22 DIAGNOSIS — E1129 Type 2 diabetes mellitus with other diabetic kidney complication: Secondary | ICD-10-CM | POA: Diagnosis not present

## 2023-09-22 DIAGNOSIS — Z794 Long term (current) use of insulin: Secondary | ICD-10-CM | POA: Diagnosis not present

## 2023-09-22 DIAGNOSIS — R809 Proteinuria, unspecified: Secondary | ICD-10-CM | POA: Diagnosis not present

## 2023-09-29 DIAGNOSIS — E1159 Type 2 diabetes mellitus with other circulatory complications: Secondary | ICD-10-CM | POA: Diagnosis not present

## 2023-09-29 DIAGNOSIS — I10 Essential (primary) hypertension: Secondary | ICD-10-CM | POA: Diagnosis not present

## 2023-09-29 DIAGNOSIS — R809 Proteinuria, unspecified: Secondary | ICD-10-CM | POA: Diagnosis not present

## 2023-09-29 DIAGNOSIS — E113299 Type 2 diabetes mellitus with mild nonproliferative diabetic retinopathy without macular edema, unspecified eye: Secondary | ICD-10-CM | POA: Diagnosis not present

## 2023-09-29 DIAGNOSIS — E1143 Type 2 diabetes mellitus with diabetic autonomic (poly)neuropathy: Secondary | ICD-10-CM | POA: Diagnosis not present

## 2023-09-29 DIAGNOSIS — E1129 Type 2 diabetes mellitus with other diabetic kidney complication: Secondary | ICD-10-CM | POA: Diagnosis not present

## 2023-09-29 DIAGNOSIS — E1169 Type 2 diabetes mellitus with other specified complication: Secondary | ICD-10-CM | POA: Diagnosis not present

## 2023-09-29 DIAGNOSIS — Z794 Long term (current) use of insulin: Secondary | ICD-10-CM | POA: Diagnosis not present

## 2023-09-29 DIAGNOSIS — Z89511 Acquired absence of right leg below knee: Secondary | ICD-10-CM | POA: Diagnosis not present

## 2023-09-29 DIAGNOSIS — E1165 Type 2 diabetes mellitus with hyperglycemia: Secondary | ICD-10-CM | POA: Diagnosis not present

## 2023-09-29 DIAGNOSIS — E785 Hyperlipidemia, unspecified: Secondary | ICD-10-CM | POA: Diagnosis not present

## 2023-10-02 DIAGNOSIS — Z89511 Acquired absence of right leg below knee: Secondary | ICD-10-CM | POA: Diagnosis not present

## 2023-10-07 DIAGNOSIS — E1143 Type 2 diabetes mellitus with diabetic autonomic (poly)neuropathy: Secondary | ICD-10-CM | POA: Diagnosis not present

## 2023-10-07 DIAGNOSIS — K3184 Gastroparesis: Secondary | ICD-10-CM | POA: Diagnosis not present

## 2023-10-07 DIAGNOSIS — Z860101 Personal history of adenomatous and serrated colon polyps: Secondary | ICD-10-CM | POA: Diagnosis not present

## 2023-10-07 DIAGNOSIS — R1314 Dysphagia, pharyngoesophageal phase: Secondary | ICD-10-CM | POA: Diagnosis not present

## 2023-10-07 DIAGNOSIS — K219 Gastro-esophageal reflux disease without esophagitis: Secondary | ICD-10-CM | POA: Diagnosis not present

## 2023-10-07 DIAGNOSIS — I10 Essential (primary) hypertension: Secondary | ICD-10-CM | POA: Diagnosis not present

## 2023-10-14 DIAGNOSIS — M5030 Other cervical disc degeneration, unspecified cervical region: Secondary | ICD-10-CM | POA: Diagnosis not present

## 2023-10-14 DIAGNOSIS — Z79891 Long term (current) use of opiate analgesic: Secondary | ICD-10-CM | POA: Diagnosis not present

## 2023-10-14 DIAGNOSIS — M1990 Unspecified osteoarthritis, unspecified site: Secondary | ICD-10-CM | POA: Diagnosis not present

## 2023-10-14 DIAGNOSIS — G894 Chronic pain syndrome: Secondary | ICD-10-CM | POA: Diagnosis not present

## 2023-10-14 DIAGNOSIS — G546 Phantom limb syndrome with pain: Secondary | ICD-10-CM | POA: Diagnosis not present

## 2023-10-22 DIAGNOSIS — F1120 Opioid dependence, uncomplicated: Secondary | ICD-10-CM | POA: Diagnosis not present

## 2023-10-22 DIAGNOSIS — I10 Essential (primary) hypertension: Secondary | ICD-10-CM | POA: Diagnosis not present

## 2023-10-30 DIAGNOSIS — Z794 Long term (current) use of insulin: Secondary | ICD-10-CM | POA: Diagnosis not present

## 2023-10-30 DIAGNOSIS — I25118 Atherosclerotic heart disease of native coronary artery with other forms of angina pectoris: Secondary | ICD-10-CM | POA: Diagnosis not present

## 2023-10-30 DIAGNOSIS — Z955 Presence of coronary angioplasty implant and graft: Secondary | ICD-10-CM | POA: Diagnosis not present

## 2023-10-30 DIAGNOSIS — I10 Essential (primary) hypertension: Secondary | ICD-10-CM | POA: Diagnosis not present

## 2023-10-30 DIAGNOSIS — E7800 Pure hypercholesterolemia, unspecified: Secondary | ICD-10-CM | POA: Diagnosis not present

## 2023-10-30 DIAGNOSIS — E119 Type 2 diabetes mellitus without complications: Secondary | ICD-10-CM | POA: Diagnosis not present

## 2023-10-30 DIAGNOSIS — Z89511 Acquired absence of right leg below knee: Secondary | ICD-10-CM | POA: Diagnosis not present

## 2023-11-02 DIAGNOSIS — Z89511 Acquired absence of right leg below knee: Secondary | ICD-10-CM | POA: Diagnosis not present

## 2023-11-12 DIAGNOSIS — Z79891 Long term (current) use of opiate analgesic: Secondary | ICD-10-CM | POA: Diagnosis not present

## 2023-11-12 DIAGNOSIS — G894 Chronic pain syndrome: Secondary | ICD-10-CM | POA: Diagnosis not present

## 2023-11-12 DIAGNOSIS — F1910 Other psychoactive substance abuse, uncomplicated: Secondary | ICD-10-CM | POA: Diagnosis not present

## 2023-11-14 DIAGNOSIS — Z89511 Acquired absence of right leg below knee: Secondary | ICD-10-CM | POA: Diagnosis not present

## 2023-11-14 DIAGNOSIS — E1169 Type 2 diabetes mellitus with other specified complication: Secondary | ICD-10-CM | POA: Diagnosis not present

## 2023-11-14 DIAGNOSIS — E785 Hyperlipidemia, unspecified: Secondary | ICD-10-CM | POA: Diagnosis not present

## 2023-11-14 DIAGNOSIS — E1159 Type 2 diabetes mellitus with other circulatory complications: Secondary | ICD-10-CM | POA: Diagnosis not present

## 2023-11-14 DIAGNOSIS — E1143 Type 2 diabetes mellitus with diabetic autonomic (poly)neuropathy: Secondary | ICD-10-CM | POA: Diagnosis not present

## 2023-11-14 DIAGNOSIS — E1165 Type 2 diabetes mellitus with hyperglycemia: Secondary | ICD-10-CM | POA: Diagnosis not present

## 2023-11-14 DIAGNOSIS — E1129 Type 2 diabetes mellitus with other diabetic kidney complication: Secondary | ICD-10-CM | POA: Diagnosis not present

## 2023-11-14 DIAGNOSIS — E113299 Type 2 diabetes mellitus with mild nonproliferative diabetic retinopathy without macular edema, unspecified eye: Secondary | ICD-10-CM | POA: Diagnosis not present

## 2023-11-14 DIAGNOSIS — Z1331 Encounter for screening for depression: Secondary | ICD-10-CM | POA: Diagnosis not present

## 2023-11-14 DIAGNOSIS — I10 Essential (primary) hypertension: Secondary | ICD-10-CM | POA: Diagnosis not present

## 2023-11-14 DIAGNOSIS — Z794 Long term (current) use of insulin: Secondary | ICD-10-CM | POA: Diagnosis not present

## 2023-12-02 ENCOUNTER — Encounter (INDEPENDENT_AMBULATORY_CARE_PROVIDER_SITE_OTHER): Payer: Self-pay | Admitting: Vascular Surgery

## 2023-12-02 ENCOUNTER — Ambulatory Visit (INDEPENDENT_AMBULATORY_CARE_PROVIDER_SITE_OTHER): Admitting: Vascular Surgery

## 2023-12-02 ENCOUNTER — Ambulatory Visit (INDEPENDENT_AMBULATORY_CARE_PROVIDER_SITE_OTHER)

## 2023-12-02 VITALS — BP 160/91 | HR 94 | Ht 69.0 in | Wt 246.5 lb

## 2023-12-02 DIAGNOSIS — Z89511 Acquired absence of right leg below knee: Secondary | ICD-10-CM

## 2023-12-02 DIAGNOSIS — E1165 Type 2 diabetes mellitus with hyperglycemia: Secondary | ICD-10-CM | POA: Diagnosis not present

## 2023-12-02 DIAGNOSIS — I10 Essential (primary) hypertension: Secondary | ICD-10-CM | POA: Diagnosis not present

## 2023-12-02 DIAGNOSIS — I739 Peripheral vascular disease, unspecified: Secondary | ICD-10-CM

## 2023-12-02 DIAGNOSIS — Z794 Long term (current) use of insulin: Secondary | ICD-10-CM

## 2023-12-02 NOTE — Assessment & Plan Note (Addendum)
 The patient has expressed a sincere and further desire to ambulate with a prosthesis. He is currently a K3 functional level. He does agree to continue with use of prosthetic and to continue working with [facility]. At this time his limb is sufficiently healed for receipt of her prosthesis. It is hoped that he will continue to be able to accomplish the ADLs he was able to do prior to amputation including, bathing, toileting, dressing, shopping and hopefully eventually returning back to some sort of gainful employment.   Needs his prosthesis adjusted and he is working with [facility] on that.  Follow-up in 6 months.

## 2023-12-02 NOTE — Progress Notes (Addendum)
 MRN : [id number]  Gerald Ellis is a 58 y.o. (Nov 17, 1965) male who presents with chief complaint of  Chief Complaint  Patient presents with   fu 3-4 months + ABI see JD/FB  .  History of Present Illness: Patient returns today in follow up of his right below-knee amputation and PAD.  No current left leg symptoms other than some mild numbness in his toes likely related to neuropathy.  His right BKA is well-healed and he is currently working with the [facility] to get his prosthesis.  That seems to be going reasonably well although it needs an adjustment currently.  His left ABI today was normal 1.21 with triphasic waveforms and normal digital pressure.  Current Outpatient Medications  Medication Sig Dispense Refill   amLODipine  (NORVASC ) 5 MG tablet Take 1 tablet (5 mg total) by mouth daily. 30 tablet 11   aspirin  81 MG chewable tablet Chew 1 tablet (81 mg total) by mouth daily. 30 tablet 11   atorvastatin  (LIPITOR ) 80 MG tablet Take 1 tablet (80 mg total) by mouth daily at 6 PM. 30 tablet 11   busPIRone  (BUSPAR ) 7.5 MG tablet Take 7.5 mg by mouth 2 (two) times daily.     cetirizine (ZYRTEC) 10 MG tablet Take 10 mg by mouth daily.     Continuous Glucose Sensor (FREESTYLE LIBRE 2 SENSOR) MISC USE 1 KIT EVERY 14 (FOURTEEN) DAYS FOR GLUCOSE MONITORING     diclofenac Sodium (VOLTAREN) 1 % GEL Apply 2 g topically 4 (four) times daily.     feeding supplement (ENSURE ENLIVE / ENSURE PLUS) LIQD Take 237 mLs by mouth 2 (two) times daily between meals.     fluticasone (FLONASE) 50 MCG/ACT nasal spray Place 1 spray into both nostrils 2 (two) times daily as needed (nasal congestion).     Fluticasone-Umeclidin-Vilant 200-62.5-25 MCG/INH AEPB Inhale into the lungs.     gabapentin  (NEURONTIN ) 300 MG capsule Take 1 capsule (300 mg total) by mouth daily AND 2 capsules (600 mg total) at bedtime. 300 mg in the morning and 600 mg at bedtime.     HUMALOG KWIKPEN 200 UNIT/ML KwikPen Inject 22 units three  times daily before meals.     HYDROcodone -acetaminophen  (NORCO/VICODIN) 5-325 MG tablet Take 1-2 tablets by mouth every 4 (four) hours as needed for severe pain (pain score 7-10) or moderate pain (pain score 4-6). 30 tablet 0   insulin  aspart (NOVOLOG ) 100 UNIT/ML injection Inject 3 Units into the skin 3 (three) times daily with meals.     insulin  aspart (NOVOLOG ) 100 UNIT/ML injection Inject 0-20 Units into the skin every 4 (four) hours.     insulin  degludec (TRESIBA FLEXTOUCH) 200 UNIT/ML FlexTouch Pen Inject 70 units nightly     insulin  glargine-yfgn (SEMGLEE ) 100 UNIT/ML injection Inject 0.4 mLs (40 Units total) into the skin at bedtime.     isosorbide  mononitrate (IMDUR ) 30 MG 24 hr tablet Take 30 mg by mouth daily.     losartan  (COZAAR ) 50 MG tablet Take 1 tablet (50 mg total) by mouth daily.     magnesium  hydroxide (MILK OF MAGNESIA) 400 MG/5ML suspension Take 30 mLs by mouth at bedtime as needed for mild constipation or moderate constipation.     metFORMIN (GLUCOPHAGE) 1000 MG tablet Take 1,000 mg by mouth 2 (two) times daily.      metoCLOPramide  (REGLAN ) 5 MG tablet Take 1 tablet (5 mg total) by mouth every 8 (eight) hours as needed for nausea or vomiting.  metoprolol  succinate (TOPROL -XL) 50 MG 24 hr tablet Take 50 mg by mouth daily. Take with or immediately following a meal.     montelukast (SINGULAIR) 10 MG tablet Take by mouth.     Multiple Vitamins-Minerals (MULTIVITAMIN WITH MINERALS) tablet Take 1 tablet by mouth 2 (two) times daily.      mupirocin  ointment (BACTROBAN ) 2 % SMARTSIG:1 Application Topical 2-3 Times Daily     naloxone (NARCAN) nasal spray 4 mg/0.1 mL Place into the nose.     Omega-3 Fatty Acids (FISH OIL) 500 MG CAPS Take 500 mg by mouth 2 (two) times daily.     ondansetron  (ZOFRAN -ODT) 4 MG disintegrating tablet Take 1 tablet (4 mg total) by mouth every 8 (eight) hours as needed for nausea or vomiting. 8 tablet 0   oxyCODONE -acetaminophen  (PERCOCET) 10-325 MG  tablet Take 1 tablet by mouth.     pantoprazole  (PROTONIX ) 40 MG tablet Take 1 tablet (40 mg total) by mouth daily. 30 tablet 1   pioglitazone  (ACTOS ) 15 MG tablet Take 15 mg by mouth 2 (two) times daily.     polyethylene glycol (MIRALAX  / GLYCOLAX ) 17 g packet Take 17 g by mouth daily.     prasugrel  (EFFIENT ) 10 MG TABS tablet Take 1 tablet (10 mg total) by mouth daily. 30 tablet    sucralfate (CARAFATE) 1 GM/10ML suspension Take 1 g by mouth 4 (four) times daily -  with meals and at bedtime.     tadalafil (CIALIS) 20 MG tablet Take 20 mg by mouth daily as needed for erectile dysfunction.     No current facility-administered medications for this visit.    Past Medical History:  Diagnosis Date   Benign enlargement of prostate    Bronchitis    Childhood asthma    Diabetes mellitus without complication (HCC)    Environmental allergies    Erectile dysfunction    Headache    Hemorrhoids    Hypercholesteremia    Hypertension    Hypogonadism in male    Lumbago    Over weight     Past Surgical History:  Procedure Laterality Date   AMPUTATION Right 05/15/2023   Procedure: AMPUTATION BELOW KNEE;  Surgeon: Marea Selinda RAMAN, MD;  Location: [facility] ORS;  Service: General;  Laterality: Right;   AMPUTATION Right 05/16/2023   Procedure: REVISION OF BELOW KNEE AMPUTATION;  Surgeon: Marea Selinda RAMAN, MD;  Location: [facility]S;  Service: General;  Laterality: Right;   BACK SURGERY  1992   CHOLECYSTECTOMY     COLONOSCOPY WITH PROPOFOL  N/A 08/07/2015   Procedure: COLONOSCOPY WITH PROPOFOL ;  Surgeon: Gladis RAYMOND Mariner, MD;  Location: [facility] ENDOSCOPY;  Service: Endoscopy;  Laterality: N/A;   COLONOSCOPY WITH PROPOFOL  N/A 08/08/2015   Procedure: COLONOSCOPY WITH PROPOFOL ;  Surgeon: Gladis RAYMOND Mariner, MD;  Location: [facility] ENDOSCOPY;  Service: Endoscopy;  Laterality: N/A;   COLONOSCOPY WITH PROPOFOL  N/A 09/16/2017   Procedure: COLONOSCOPY WITH PROPOFOL ;  Surgeon: Mariner Gladis RAYMOND, MD;  Location: [facility] ENDOSCOPY;   Service: Endoscopy;  Laterality: N/A;   COLONOSCOPY WITH PROPOFOL  N/A 12/20/2020   Procedure: COLONOSCOPY WITH PROPOFOL ;  Surgeon: Dessa Reyes ORN, MD;  Location: [facility] ENDOSCOPY;  Service: Endoscopy;  Laterality: N/A;  IDDM   CORONARY STENT INTERVENTION N/A 04/20/2019   Procedure: CORONARY STENT INTERVENTION;  Surgeon: Ammon Blunt, MD;  Location: [facility] INVASIVE CV LAB;  Service: Cardiovascular;  Laterality: N/A;   ESOPHAGOGASTRODUODENOSCOPY (EGD) WITH PROPOFOL  N/A 08/07/2015   Procedure: ESOPHAGOGASTRODUODENOSCOPY (EGD) WITH PROPOFOL ;  Surgeon: Gladis RAYMOND Mariner, MD;  Location: [facility] ENDOSCOPY;  Service: Endoscopy;  Laterality: N/A;   ESOPHAGOGASTRODUODENOSCOPY (EGD) WITH PROPOFOL  N/A 09/16/2017   Procedure: ESOPHAGOGASTRODUODENOSCOPY (EGD) WITH PROPOFOL ;  Surgeon: Gaylyn Gladis PENNER, MD;  Location: [facility] ENDOSCOPY;  Service: Endoscopy;  Laterality: N/A;   ESOPHAGOGASTRODUODENOSCOPY (EGD) WITH PROPOFOL  N/A 12/20/2020   Procedure: ESOPHAGOGASTRODUODENOSCOPY (EGD) WITH PROPOFOL ;  Surgeon: Dessa Reyes ORN, MD;  Location: [facility] ENDOSCOPY;  Service: Endoscopy;  Laterality: N/A;   IRRIGATION AND DEBRIDEMENT FOOT Right 05/12/2023   Procedure: IRRIGATION AND DEBRIDEMENT FOOT, WASH OUT;  Surgeon: Neill Boas, DPM;  Location: [facility] ORS;  Service: Orthopedics/Podiatry;  Laterality: Right;   LEFT HEART CATH AND CORONARY ANGIOGRAPHY Left 04/20/2019   Procedure: LEFT HEART CATH AND CORONARY ANGIOGRAPHY;  Surgeon: Ammon Blunt, MD;  Location: [facility] INVASIVE CV LAB;  Service: Cardiovascular;  Laterality: Left;   LOWER EXTREMITY ANGIOGRAPHY Right 05/09/2023   Procedure: Lower Extremity Angiography;  Surgeon: Jama Cordella MATSU, MD;  Location: [facility] INVASIVE CV LAB;  Service: Cardiovascular;  Laterality: Right;   TRANSMETATARSAL AMPUTATION Right 05/08/2023   Procedure: TRANSMETATARSAL AMPUTATION;  Surgeon: Neill Boas, DPM;  Location: [facility] ORS;  Service: Orthopedics/Podiatry;  Laterality: Right;      Social History   Tobacco Use   Smoking status: Never   Smokeless tobacco: Never  Vaping Use   Vaping status: Never Used  Substance Use Topics   Alcohol use: Yes    Alcohol/week: 0.0 standard drinks of alcohol    Comment: occasionally   Drug use: No      Family History  Problem Relation Age of Onset   Heart disease Mother        CABG   Lung cancer Mother        Disseminated   Hypertension Mother    Hyperlipidemia Mother    Diabetes Father        mother   Colon cancer Father 80   Cancer Maternal Aunt        x3 aunts, unk types of cancer   Colon cancer Maternal Uncle        x3 uncles   Prostate cancer Maternal Uncle        x4 uncles   Cancer Paternal Uncle        prostate vs colon   Alzheimer's disease Other    Kidney disease Neg Hx      Allergies  Allergen Reactions   Ibuprofen Shortness Of Breath   Nsaids Swelling    Swelling of throat   Fish Oil Nausea Only, Other (See Comments) and Nausea And Vomiting    Acid reflux    REVIEW OF SYSTEMS (Negative unless checked)   Constitutional: [] Weight loss  [] Fever  [] Chills Cardiac: [] Chest pain   [] Chest pressure   [] Palpitations   [] Shortness of breath when laying flat   [] Shortness of breath at rest   [] Shortness of breath with exertion. Vascular:  [] Pain in legs with walking   [] Pain in legs at rest   [] Pain in legs when laying flat   [] Claudication   [] Pain in feet when walking  [] Pain in feet at rest  [] Pain in feet when laying flat   [] History of DVT   [] Phlebitis   [] Swelling in legs   [] Varicose veins   [] Non-healing ulcers Pulmonary:   [] Uses home oxygen    [] Productive cough   [] Hemoptysis   [] Wheeze  [] COPD   [] Asthma Neurologic:  [] Dizziness  [] Blackouts   [] Seizures   [] History of stroke   [] History of TIA  [] Aphasia   [] Temporary blindness   []   Dysphagia   [] Weakness or numbness in arms   [x] Weakness or numbness in legs Musculoskeletal:  [] Arthritis   [] Joint swelling   [] Joint pain   [x] Low back  pain Hematologic:  [] Easy bruising  [] Easy bleeding   [] Hypercoagulable state   [] Anemic   Gastrointestinal:  [] Blood in stool   [] Vomiting blood  [] Gastroesophageal reflux/heartburn   [] Abdominal pain Genitourinary:  [] Chronic kidney disease   [] Difficult urination  [] Frequent urination  [] Burning with urination   [] Hematuria Skin:  [] Rashes   [x] Ulcers   [x] Wounds Psychological:  [] History of anxiety   []  History of major depression.  Physical Examination  BP (!) 160/91   Pulse 94   Ht 5' 9 (1.753 m)   Wt 246 lb 8 oz (111.8 kg)   BMI 36.40 kg/m  Gen:  WD/WN, NAD Head: [place]/AT, No temporalis wasting. Ear/Nose/Throat: Hearing grossly intact, nares w/o erythema or drainage Eyes: Conjunctiva clear. Sclera non-icteric Neck: Supple.  Trachea midline Pulmonary:  Good air movement, no use of accessory muscles.  Cardiac: RRR, no JVD Vascular:  Vessel Right Left  Radial Palpable Palpable                          PT Not Palpable Palpable  DP Not Palpable Palpable   Gastrointestinal: soft, non-tender/non-distended. No guarding/reflex.  Musculoskeletal: M/S 5/5 throughout.  No deformity or atrophy. Right BKA well healed. No edema. Neurologic: Sensation grossly intact in extremities.  Symmetrical.  Speech is fluent.  Psychiatric: Judgment intact, Mood & affect appropriate for pt's clinical situation. Dermatologic: No rashes or ulcers noted.  No cellulitis or open wounds.      Labs Recent Results (from the past 2160 hours)  VAS US  ABI WITH/WO TBI     Status: None   Collection Time: 12/02/23  2:26 PM  Result Value Ref Range   Right ABI BKA    Left ABI 1.21     Radiology VAS US  ABI WITH/WO TBI Result Date: 12/03/2023  LOWER EXTREMITY DOPPLER STUDY Patient Name:  Gerald Ellis  Date of Exam:   12/02/2023 Medical Rec #: [id number]         Accession #:    [id number] Date of Birth: 1965-07-23        Patient Gender: M Patient Age:   49 years Exam Location:  [facility]  Procedure:      VAS US  ABI WITH/WO TBI Referring Phys: --------------------------------------------------------------------------------  Indications: Peripheral artery disease. right BKA  Vascular Interventions: Right SFA/popliteal stent with subsequent BKA in                         04/2023;. Performing Technologist: Elsie Churn RT, RDMS, RVT  Examination Guidelines: A complete evaluation includes at minimum, Doppler waveform signals and systolic blood pressure reading at the level of bilateral brachial, anterior tibial, and posterior tibial arteries, when vessel segments are accessible. Bilateral testing is considered an integral part of a complete examination. Photoelectric Plethysmograph (PPG) waveforms and toe systolic pressure readings are included as required and additional duplex testing as needed. Limited examinations for reoccurring indications may be performed as noted.  ABI Findings: +--------+------------------+-----+--------+---------+ Right   Rt Pressure (mmHg)IndexWaveformComment   +--------+------------------+-----+--------+---------+ [id number]                            Right BKA +--------+------------------+-----+--------+---------+ +---------+------------------+-----+---------+-------+ Left     Lt Pressure (mmHg)IndexWaveform Comment +---------+------------------+-----+---------+-------+  Brachial 163                                     +---------+------------------+-----+---------+-------+ PTA      168               1.03 triphasic        +---------+------------------+-----+---------+-------+ DP       198               1.21 triphasic        +---------+------------------+-----+---------+-------+ Great [id number]               0.70 Normal           +---------+------------------+-----+---------+-------+ +-------+-----------+-----------+------------+------------+ ABI/TBIToday's ABIToday's TBIPrevious ABIPrevious TBI  +-------+-----------+-----------+------------+------------+ Right  BKA                   1.19        0.76         +-------+-----------+-----------+------------+------------+ Left   1.21       0.70       1.14        0.70         +-------+-----------+-----------+------------+------------+ Left ABIs appear essentially unchanged compared to prior study on 05/29/20.  Summary: Left: Resting left ankle-brachial index is within normal range. The left toe-brachial index is normal. *See table(s) above for measurements and observations.  Electronically signed by Selinda Gu MD on 12/03/2023 at 2:38:33 PM.    Final     Assessment/Plan  PAD (peripheral artery disease) (HCC) His left ABI today was normal 1.21 with triphasic waveforms and normal digital pressure.  Has already lost the right leg.  BKA is well-healed and they are working on the prosthesis.  Follow-up in 6 months.  Hx of BKA, right (HCC) The patient has expressed a sincere and further desire to ambulate with a prosthesis. He is currently a K3 functional level. He does agree to continue with use of prosthetic and to continue working with [facility]. At this time his limb is sufficiently healed for receipt of her prosthesis. It is hoped that he will continue to be able to accomplish the ADLs he was able to do prior to amputation including, bathing, toileting, dressing, shopping and hopefully eventually returning back to some sort of gainful employment.   Needs his prosthesis adjusted and he is working with [facility] on that.  Follow-up in 6 months.  HTN (hypertension) blood pressure control important in reducing the progression of atherosclerotic disease. On appropriate oral medications.     Diabetes mellitus type 2, uncomplicated (HCC) blood glucose control important in reducing the progression of atherosclerotic disease. Also, involved in wound healing. On appropriate medications.  Selinda Gu, MD  12/16/2023 8:27 AM    This note  was created with Dragon medical transcription system.  Any errors from dictation are purely unintentional

## 2023-12-02 NOTE — Assessment & Plan Note (Signed)
 His left ABI today was normal 1.21 with triphasic waveforms and normal digital pressure.  Has already lost the right leg.  BKA is well-healed and they are working on the prosthesis.  Follow-up in 6 months.

## 2023-12-03 LAB — VAS US ABI WITH/WO TBI: Left ABI: 1.21

## 2023-12-11 DIAGNOSIS — H2513 Age-related nuclear cataract, bilateral: Secondary | ICD-10-CM | POA: Diagnosis not present

## 2023-12-11 DIAGNOSIS — H04123 Dry eye syndrome of bilateral lacrimal glands: Secondary | ICD-10-CM | POA: Diagnosis not present

## 2023-12-11 DIAGNOSIS — E113313 Type 2 diabetes mellitus with moderate nonproliferative diabetic retinopathy with macular edema, bilateral: Secondary | ICD-10-CM | POA: Diagnosis not present

## 2023-12-11 DIAGNOSIS — D3131 Benign neoplasm of right choroid: Secondary | ICD-10-CM | POA: Diagnosis not present

## 2023-12-12 ENCOUNTER — Encounter: Payer: Self-pay | Admitting: *Deleted

## 2023-12-26 ENCOUNTER — Ambulatory Visit
Admission: RE | Admit: 2023-12-26 | Discharge: 2023-12-26 | Disposition: A | Discharge status: Home / Self Care | Attending: Gastroenterology | Admitting: Gastroenterology

## 2023-12-26 ENCOUNTER — Ambulatory Visit: Payer: Self-pay | Admitting: Anesthesiology

## 2023-12-26 ENCOUNTER — Encounter
Admission: RE | Disposition: A | Discharge status: Home / Self Care | Payer: Self-pay | Source: Home / Self Care | Attending: Gastroenterology

## 2023-12-26 DIAGNOSIS — Z1211 Encounter for screening for malignant neoplasm of colon: Secondary | ICD-10-CM | POA: Diagnosis not present

## 2023-12-26 DIAGNOSIS — K3189 Other diseases of stomach and duodenum: Secondary | ICD-10-CM | POA: Diagnosis not present

## 2023-12-26 DIAGNOSIS — E7800 Pure hypercholesterolemia, unspecified: Secondary | ICD-10-CM | POA: Insufficient documentation

## 2023-12-26 DIAGNOSIS — K5790 Diverticulosis of intestine, part unspecified, without perforation or abscess without bleeding: Secondary | ICD-10-CM | POA: Diagnosis not present

## 2023-12-26 DIAGNOSIS — K635 Polyp of colon: Secondary | ICD-10-CM | POA: Diagnosis not present

## 2023-12-26 DIAGNOSIS — E119 Type 2 diabetes mellitus without complications: Secondary | ICD-10-CM | POA: Diagnosis not present

## 2023-12-26 DIAGNOSIS — Z09 Encounter for follow-up examination after completed treatment for conditions other than malignant neoplasm: Secondary | ICD-10-CM | POA: Diagnosis not present

## 2023-12-26 DIAGNOSIS — K5730 Diverticulosis of large intestine without perforation or abscess without bleeding: Secondary | ICD-10-CM | POA: Diagnosis not present

## 2023-12-26 DIAGNOSIS — Z860101 Personal history of adenomatous and serrated colon polyps: Secondary | ICD-10-CM | POA: Diagnosis not present

## 2023-12-26 DIAGNOSIS — I2510 Atherosclerotic heart disease of native coronary artery without angina pectoris: Secondary | ICD-10-CM | POA: Diagnosis not present

## 2023-12-26 DIAGNOSIS — I10 Essential (primary) hypertension: Secondary | ICD-10-CM | POA: Insufficient documentation

## 2023-12-26 DIAGNOSIS — R1310 Dysphagia, unspecified: Secondary | ICD-10-CM | POA: Diagnosis not present

## 2023-12-26 DIAGNOSIS — D123 Benign neoplasm of transverse colon: Secondary | ICD-10-CM | POA: Insufficient documentation

## 2023-12-26 DIAGNOSIS — K2950 Unspecified chronic gastritis without bleeding: Secondary | ICD-10-CM | POA: Insufficient documentation

## 2023-12-26 DIAGNOSIS — K2970 Gastritis, unspecified, without bleeding: Secondary | ICD-10-CM | POA: Diagnosis not present

## 2023-12-26 DIAGNOSIS — K633 Ulcer of intestine: Secondary | ICD-10-CM | POA: Diagnosis not present

## 2023-12-26 HISTORY — PX: COLONOSCOPY: SHX5424

## 2023-12-26 HISTORY — PX: POLYPECTOMY: SHX149

## 2023-12-26 HISTORY — PX: ESOPHAGOGASTRODUODENOSCOPY: SHX5428

## 2023-12-26 HISTORY — PX: HEMOSTASIS CLIP PLACEMENT: SHX6857

## 2023-12-26 LAB — GLUCOSE, CAPILLARY: Glucose-Capillary: 189 mg/dL — ABNORMAL HIGH (ref 70–99)

## 2023-12-26 SURGERY — COLONOSCOPY
Anesthesia: General

## 2023-12-26 MED ORDER — FENTANYL CITRATE (PF) 100 MCG/2ML IJ SOLN
INTRAMUSCULAR | Status: DC | PRN
  Administered 2023-12-26: 50 ug via INTRAVENOUS

## 2023-12-26 MED ORDER — LIDOCAINE HCL (PF) 2 % IJ SOLN
INTRAMUSCULAR | Status: AC
  Filled 2023-12-26: qty 5

## 2023-12-26 MED ORDER — PHENYLEPHRINE 80 MCG/ML (10ML) SYRINGE FOR IV PUSH (FOR BLOOD PRESSURE SUPPORT)
PREFILLED_SYRINGE | INTRAVENOUS | Status: AC
  Filled 2023-12-26: qty 10

## 2023-12-26 MED ORDER — EPHEDRINE SULFATE-NACL 50-0.9 MG/10ML-% IV SOSY
PREFILLED_SYRINGE | INTRAVENOUS | Status: DC | PRN
  Administered 2023-12-26 (×2): 5 mg via INTRAVENOUS

## 2023-12-26 MED ORDER — EPHEDRINE 5 MG/ML INJ
INTRAVENOUS | Status: AC
Start: 2023-12-26 — End: 2023-12-26
  Filled 2023-12-26: qty 5

## 2023-12-26 MED ORDER — LIDOCAINE HCL (CARDIAC) PF 100 MG/5ML IV SOSY
PREFILLED_SYRINGE | INTRAVENOUS | Status: DC | PRN
  Administered 2023-12-26: 100 mg via INTRAVENOUS

## 2023-12-26 MED ORDER — PROPOFOL 1000 MG/100ML IV EMUL
INTRAVENOUS | Status: AC
Start: 2023-12-26 — End: 2023-12-26
  Filled 2023-12-26: qty 100

## 2023-12-26 MED ORDER — PROPOFOL 500 MG/50ML IV EMUL
INTRAVENOUS | Status: DC | PRN
  Administered 2023-12-26: 70 mg via INTRAVENOUS
  Administered 2023-12-26: 125 ug/kg/min via INTRAVENOUS

## 2023-12-26 MED ORDER — SODIUM CHLORIDE 0.9 % IV SOLN
INTRAVENOUS | Status: DC
  Administered 2023-12-26: 500 mL via INTRAVENOUS

## 2023-12-26 MED ORDER — FENTANYL CITRATE (PF) 100 MCG/2ML IJ SOLN
INTRAMUSCULAR | Status: AC
  Filled 2023-12-26: qty 2

## 2023-12-26 NOTE — Anesthesia Preprocedure Evaluation (Signed)
 Anesthesia Evaluation  Patient identified by MRN, date of birth, ID band Patient awake    Reviewed: Allergy & Precautions, H&P , NPO status , Patient's Chart, lab work & pertinent test results, reviewed documented beta blocker date and time   History of Anesthesia Complications Negative for: history of anesthetic complications  Airway Mallampati: III  TM Distance: >3 FB Neck ROM: full    Dental  (+) Chipped, Dental Advidsory Given   Pulmonary neg shortness of breath, asthma , neg COPD, neg recent URI   Pulmonary exam normal        Cardiovascular Exercise Tolerance: Good hypertension, On Medications (-) angina + CAD, + Cardiac Stents and + Peripheral Vascular Disease  (-) Past MI Normal cardiovascular exam(-) dysrhythmias (-) Valvular Problems/Murmurs Rhythm:regular Rate:Normal     Neuro/Psych  Headaches, neg Seizures PSYCHIATRIC DISORDERS Anxiety Depression     Neuromuscular disease    GI/Hepatic negative GI ROS, Neg liver ROS,GERD  Medicated and Poorly Controlled,,  Endo/Other  diabetes, Poorly Controlled    Renal/GU      Musculoskeletal   Abdominal   Peds  Hematology negative hematology ROS (+)   Anesthesia Other Findings Past Medical History: No date: Benign enlargement of prostate No date: Bronchitis No date: Childhood asthma No date: Diabetes mellitus without complication (HCC) No date: Environmental allergies No date: Erectile dysfunction No date: Headache No date: Hemorrhoids No date: Hypercholesteremia No date: Hypertension No date: Hypogonadism in male No date: Lumbago No date: Over weight  Past Surgical History: 1992: BACK SURGERY No date: CHOLECYSTECTOMY 08/07/2015: COLONOSCOPY WITH PROPOFOL ; N/A     Comment:  Procedure: COLONOSCOPY WITH PROPOFOL ;  Surgeon: Gladis RAYMOND Mariner, MD;  Location: [facility] ENDOSCOPY;  Service:               Endoscopy;  Laterality: N/A; 08/08/2015:  COLONOSCOPY WITH PROPOFOL ; N/A     Comment:  Procedure: COLONOSCOPY WITH PROPOFOL ;  Surgeon: Gladis RAYMOND Mariner, MD;  Location: [facility] ENDOSCOPY;  Service:               Endoscopy;  Laterality: N/A; 09/16/2017: COLONOSCOPY WITH PROPOFOL ; N/A     Comment:  Procedure: COLONOSCOPY WITH PROPOFOL ;  Surgeon:               Mariner Gladis RAYMOND, MD;  Location: [facility] ENDOSCOPY;                Service: Endoscopy;  Laterality: N/A; 12/20/2020: COLONOSCOPY WITH PROPOFOL ; N/A     Comment:  Procedure: COLONOSCOPY WITH PROPOFOL ;  Surgeon: Dessa Reyes ORN, MD;  Location: [facility] ENDOSCOPY;  Service:               Endoscopy;  Laterality: N/A;  IDDM 04/20/2019: CORONARY STENT INTERVENTION; N/A     Comment:  Procedure: CORONARY STENT INTERVENTION;  Surgeon:               Ammon Blunt, MD;  Location: [facility] INVASIVE CV               LAB;  Service: Cardiovascular;  Laterality: N/A; 08/07/2015: ESOPHAGOGASTRODUODENOSCOPY (EGD) WITH PROPOFOL ; N/A     Comment:  Procedure: ESOPHAGOGASTRODUODENOSCOPY (EGD) WITH               PROPOFOL ;  Surgeon: Gladis RAYMOND Mariner, MD;  Location:  [facility] ENDOSCOPY;  Service: Endoscopy;  Laterality: N/A; 09/16/2017: ESOPHAGOGASTRODUODENOSCOPY (EGD) WITH PROPOFOL ; N/A     Comment:  Procedure: ESOPHAGOGASTRODUODENOSCOPY (EGD) WITH               PROPOFOL ;  Surgeon: Gaylyn Gladis PENNER, MD;  Location:               [facility] ENDOSCOPY;  Service: Endoscopy;  Laterality: N/A; 12/20/2020: ESOPHAGOGASTRODUODENOSCOPY (EGD) WITH PROPOFOL ; N/A     Comment:  Procedure: ESOPHAGOGASTRODUODENOSCOPY (EGD) WITH               PROPOFOL ;  Surgeon: Dessa Reyes ORN, MD;  Location:               [facility] ENDOSCOPY;  Service: Endoscopy;  Laterality: N/A; 04/20/2019: LEFT HEART CATH AND CORONARY ANGIOGRAPHY; Left     Comment:  Procedure: LEFT HEART CATH AND CORONARY ANGIOGRAPHY;                Surgeon: Ammon Blunt, MD;  Location: [facility]               INVASIVE CV LAB;  Service:  Cardiovascular;  Laterality:               Left;  BMI    Body Mass Index: 33.72 kg/m      Reproductive/Obstetrics negative OB ROS                              Anesthesia Physical Anesthesia Plan  ASA: 3  Anesthesia Plan: General   Post-op Pain Management:    Induction: Intravenous  PONV Risk Score and Plan: 2 and Propofol  infusion and TIVA  Airway Management Planned: Natural Airway and Nasal Cannula  Additional Equipment:   Intra-op Plan:   Post-operative Plan: Extubation in OR  Informed Consent: I have reviewed the patients History and Physical, chart, labs and discussed the procedure including the risks, benefits and alternatives for the proposed anesthesia with the patient or authorized representative who has indicated his/her understanding and acceptance.     Dental Advisory Given  Plan Discussed with: Anesthesiologist, CRNA and Surgeon  Anesthesia Plan Comments: (Patient consented for risks of anesthesia including but not limited to:  - adverse reactions to medications - risk of airway placement if required - damage to eyes, teeth, lips or other oral mucosa - nerve damage due to positioning  - sore throat or hoarseness - Damage to heart, brain, nerves, lungs, other parts of body or loss of life  Patient voiced understanding and assent.)        Anesthesia Quick Evaluation

## 2023-12-26 NOTE — Op Note (Signed)
 [facility]astroenterology Patient Name: Gerald Ellis Procedure Date: 12/26/2023 8:20 AM MRN: [id number] Account #: [id number] Date of Birth: 1966/02/08 Admit Type: Outpatient Age: 58 Room: [facility] ENDO ROOM 3 Gender: Male Note Status: Finalized Instrument Name: [facility] Scope [id number] Procedure:             Upper GI endoscopy Indications:           Dysphagia Providers:             Ole Schick MD, MD Referring MD:          Lynwood FALCON. Valora, MD (Referring MD) Medicines:             Monitored Anesthesia Care Complications:         No immediate complications. Estimated blood loss:                         Minimal. Procedure:             Pre-Anesthesia Assessment:                        - Prior to the procedure, a History and Physical was                         performed, and patient medications and allergies were                         reviewed. The patient is competent. The risks and                         benefits of the procedure and the sedation options and                         risks were discussed with the patient. All questions                         were answered and informed consent was obtained.                         Patient identification and proposed procedure were                         verified by the physician, the nurse, the                         anesthesiologist, the anesthetist and the technician                         in the endoscopy suite. Mental Status Examination:                         alert and oriented. Airway Examination: normal                         oropharyngeal airway and neck mobility. Respiratory                         Examination: clear to auscultation. CV Examination:  normal. Prophylactic Antibiotics: The patient does not                         require prophylactic antibiotics. Prior                         Anticoagulants: The patient has taken no anticoagulant                         or  antiplatelet agents. ASA Grade Assessment: III - A                         patient with severe systemic disease. After reviewing                         the risks and benefits, the patient was deemed in                         satisfactory condition to undergo the procedure. The                         anesthesia plan was to use monitored anesthesia care                         (MAC). Immediately prior to administration of                         medications, the patient was re-assessed for adequacy                         to receive sedatives. The heart rate, respiratory                         rate, oxygen  saturations, blood pressure, adequacy of                         pulmonary ventilation, and response to care were                         monitored throughout the procedure. The physical                         status of the patient was re-assessed after the                         procedure.                        After obtaining informed consent, the endoscope was                         passed under direct vision. Throughout the procedure,                         the patient's blood pressure, pulse, and oxygen                          saturations were monitored continuously. The Endoscope  was introduced through the mouth, and advanced to the                         second part of duodenum. The upper GI endoscopy was                         accomplished without difficulty. The patient tolerated                         the procedure well. Findings:      No endoscopic abnormality was evident in the esophagus to explain the       patient's complaint of dysphagia.      Patchy mild inflammation characterized by erythema was found in the       gastric antrum. Biopsies were taken with a cold forceps for Helicobacter       pylori testing. Estimated blood loss was minimal.      The examined duodenum was normal. Impression:            - No endoscopic esophageal  abnormality to explain                         patient's dysphagia.                        - Gastritis. Biopsied.                        - Normal examined duodenum. Recommendation:        - Discharge patient to home.                        - Resume previous diet.                        - Continue present medications.                        - Await pathology results.                        - Return to referring physician as previously                         scheduled. Procedure Code(s):     --- Professional ---                        [phone], Esophagogastroduodenoscopy, flexible,                         transoral; with biopsy, single or multiple Diagnosis Code(s):     --- Professional ---                        R13.10, Dysphagia, unspecified                        K29.70, Gastritis, unspecified, without bleeding CPT copyright 2022 American Medical Association. All rights reserved. The codes documented in this report are preliminary and upon coder review may  be revised to meet current compliance requirements. Ole Schick MD, MD 12/26/2023 9:05:28 AM Number of Addenda: 0 Note Initiated  On: 12/26/2023 8:20 AM Estimated Blood Loss:  Estimated blood loss was minimal.      [facility]

## 2023-12-26 NOTE — Op Note (Signed)
 [facility]enter[facility] Patient Name: Gerald Ellis Procedure Date: 12/26/2023 8:20 AM MRN: [id number] Account #: [id number] Date of Birth: 1965/10/02 Admit Type: Outpatient Age: 58 Room: [facility] ENDO ROOM 3 Gender: Male Note Status: Finalized Instrument Name: Colon Scope [id number] Procedure:             Colonoscopy Indications:           Surveillance: Personal history of adenomatous polyps                         on last colonoscopy 3 years ago Providers:             Ole Schick MD, MD Referring MD:          Lynwood FALCON. Valora, MD (Referring MD) Medicines:             Monitored Anesthesia Care Complications:         No immediate complications. Estimated blood loss:                         Minimal. Procedure:             Pre-Anesthesia Assessment:                        - Prior to the procedure, a History and Physical was                         performed, and patient medications and allergies were                         reviewed. The patient is competent. The risks and                         benefits of the procedure and the sedation options and                         risks were discussed with the patient. All questions                         were answered and informed consent was obtained.                         Patient identification and proposed procedure were                         verified by the physician, the nurse, the                         anesthesiologist, the anesthetist and the technician                         in the endoscopy suite. Mental Status Examination:                         alert and oriented. Airway Examination: normal                         oropharyngeal airway and neck mobility. Respiratory  Examination: clear to auscultation. CV Examination:                         normal. Prophylactic Antibiotics: The patient does not                         require prophylactic antibiotics. Prior                          Anticoagulants: The patient has taken no anticoagulant                         or antiplatelet agents except for aspirin . ASA Grade                         Assessment: III - A patient with severe systemic                         disease. After reviewing the risks and benefits, the                         patient was deemed in satisfactory condition to                         undergo the procedure. The anesthesia plan was to use                         monitored anesthesia care (MAC). Immediately prior to                         administration of medications, the patient was                         re-assessed for adequacy to receive sedatives. The                         heart rate, respiratory rate, oxygen  saturations,                         blood pressure, adequacy of pulmonary ventilation, and                         response to care were monitored throughout the                         procedure. The physical status of the patient was                         re-assessed after the procedure.                        After obtaining informed consent, the colonoscope was                         passed under direct vision. Throughout the procedure,                         the patient's blood pressure, pulse, and oxygen   saturations were monitored continuously. The                         Colonoscope was introduced through the anus and                         advanced to the the cecum, identified by appendiceal                         orifice and ileocecal valve. The colonoscopy was                         performed without difficulty. The patient tolerated                         the procedure well. The quality of the bowel                         preparation was fair. The ileocecal valve, appendiceal                         orifice, and rectum were photographed. Findings:      The perianal and digital rectal examinations were normal.      Multiple large-mouthed  and small-mouthed diverticula were found in the       sigmoid colon, descending colon, transverse colon, hepatic flexure and       ascending colon.      A 5 mm polyp was found in the hepatic flexure. The polyp was sessile.       The polyp was removed with a cold snare. Resection and retrieval were       complete. Estimated blood loss was minimal.      A single localized non-bleeding erosion was found in the proximal       transverse colon. No stigmata of recent bleeding were seen.      A 15 mm polyp was found in the distal transverse colon. The polyp was       semi-pedunculated. The polyp was removed with a hot snare. Resection and       retrieval were complete. To prevent bleeding after the polypectomy, one       hemostatic clip was successfully placed. There was no bleeding during,       or at the end, of the procedure.      The exam was otherwise without abnormality on direct and retroflexion       views. Impression:            - Preparation of the colon was fair.                        - Diverticulosis in the sigmoid colon, in the                         descending colon, in the transverse colon, at the                         hepatic flexure and in the ascending colon.                        - One 5 mm polyp at the hepatic flexure,  removed with                         a cold snare. Resected and retrieved.                        - A single erosion in the proximal transverse colon.                        - One 15 mm polyp in the distal transverse colon,                         removed with a hot snare. Resected and retrieved. Clip                         was placed.                        - The examination was otherwise normal on direct and                         retroflexion views. Recommendation:        - Discharge patient to home.                        - Resume previous diet.                        - Continue present medications.                        - Await pathology  results.                        - Repeat colonoscopy in 2-3 years because the bowel                         preparation was suboptimal.                        - Return to referring physician as previously                         scheduled. Procedure Code(s):     --- Professional ---                        [phone], Colonoscopy, flexible; with removal of                         tumor(s), polyp(s), or other lesion(s) by snare                         technique Diagnosis Code(s):     --- Professional ---                        Z86.010, Personal history of colonic polyps                        D12.3, Benign neoplasm of transverse colon (hepatic  flexure or splenic flexure)                        K63.3, Ulcer of intestine                        K57.30, Diverticulosis of large intestine without                         perforation or abscess without bleeding CPT copyright 2022 American Medical Association. All rights reserved. The codes documented in this report are preliminary and upon coder review may  be revised to meet current compliance requirements. Ole Schick MD, MD 12/26/2023 9:20:39 AM Number of Addenda: 0 Note Initiated On: 12/26/2023 8:20 AM Scope Withdrawal Time: 0 hours 13 minutes 13 seconds  Total Procedure Duration: 0 hours 18 minutes 35 seconds  Estimated Blood Loss:  Estimated blood loss was minimal.      [facility]

## 2023-12-26 NOTE — Interval H&P Note (Signed)
 History and Physical Interval Note:  12/26/2023 8:28 AM  Gerald Ellis  has presented today for surgery, with the diagnosis of History of adenomatous polyp of colon (Z86.0101) Gastroesophageal reflux disease, unspecified whether esophagitis present (K21.9) Pharyngoesophageal dysphagia (R13.14).  The various methods of treatment have been discussed with the patient and family. After consideration of risks, benefits and other options for treatment, the patient has consented to  Procedure(s): COLONOSCOPY (N/A) EGD (ESOPHAGOGASTRODUODENOSCOPY) (N/A) as a surgical intervention.  The patient's history has been reviewed, patient examined, no change in status, stable for surgery.  I have reviewed the patient's chart and labs.  Questions were answered to the patient's satisfaction.     Ole [name]  Ok to proceed with EGD/Colonoscopy

## 2023-12-26 NOTE — Transfer of Care (Signed)
 Immediate Anesthesia Transfer of Care Note  Patient: Gerald Ellis  Procedure(s) Performed: COLONOSCOPY EGD (ESOPHAGOGASTRODUODENOSCOPY) POLYPECTOMY, INTESTINE  Patient Location: PACU  Anesthesia Type:MAC  Level of Consciousness: awake and alert   Airway & Oxygen  Therapy: Patient Spontanous Breathing  Post-op Assessment: Report given to RN and Post -op Vital signs reviewed and stable  Post vital signs: stable  Last Vitals:  Vitals Value Taken Time  BP 98/72 12/26/23 09:07  Temp    Pulse 86 12/26/23 09:07  Resp 17 12/26/23 09:07  SpO2 97 % 12/26/23 09:07  Vitals shown include unfiled device data.  Last Pain:  Vitals:   12/26/23 0907  TempSrc:   PainSc: 0-No pain         Complications: No notable events documented.

## 2023-12-26 NOTE — H&P (Signed)
 Outpatient short stay form Pre-procedure 12/26/2023  Gerald [name], MD  Primary Physician: Valora Agent, MD  Reason for visit:  Dysphagia/Surveillance  History of present illness:    58 y/o gentleman with history of BKA, hypertension, DM II, and chronic pain on chronic narcotics here for EGD for dysphagia and colonoscopy for history of adenomatous polyps. Last colonoscopy in 2022. No blood thinners. Unknown family history of GI malignancies and patient tested negative for any colon cancer syndromes. No significant abdominal surgeries.    Current Facility-Administered Medications:    0.9 %  sodium chloride  infusion, , Intravenous, Continuous, Ozell Juhasz, Gerald ONEIDA, MD, Last Rate: 20 mL/hr at 12/26/23 0757, 500 mL at 12/26/23 0757  Medications Prior to Admission  Medication Sig Dispense Refill Last Dose/Taking   amLODipine  (NORVASC ) 5 MG tablet Take 1 tablet (5 mg total) by mouth daily. 30 tablet 11 12/25/2023   aspirin  81 MG chewable tablet Chew 1 tablet (81 mg total) by mouth daily. 30 tablet 11 Past Week   atorvastatin  (LIPITOR ) 80 MG tablet Take 1 tablet (80 mg total) by mouth daily at 6 PM. 30 tablet 11 Past Week   busPIRone  (BUSPAR ) 7.5 MG tablet Take 7.5 mg by mouth 2 (two) times daily.   Past Week   cetirizine (ZYRTEC) 10 MG tablet Take 10 mg by mouth daily.   Past Week   Continuous Glucose Sensor (FREESTYLE LIBRE 2 SENSOR) MISC USE 1 KIT EVERY 14 (FOURTEEN) DAYS FOR GLUCOSE MONITORING   12/26/2023 Morning   diclofenac Sodium (VOLTAREN) 1 % GEL Apply 2 g topically 4 (four) times daily.   Past Week   feeding supplement (ENSURE ENLIVE / ENSURE PLUS) LIQD Take 237 mLs by mouth 2 (two) times daily between meals.   Past Week   fluticasone (FLONASE) 50 MCG/ACT nasal spray Place 1 spray into both nostrils 2 (two) times daily as needed (nasal congestion).   Past Month   gabapentin  (NEURONTIN ) 300 MG capsule Take 1 capsule (300 mg total) by mouth daily AND 2 capsules (600 mg total) at  bedtime. 300 mg in the morning and 600 mg at bedtime.   Past Week   HUMALOG KWIKPEN 200 UNIT/ML KwikPen Inject 22 units three times daily before meals.   12/25/2023   HYDROcodone -acetaminophen  (NORCO/VICODIN) 5-325 MG tablet Take 1-2 tablets by mouth every 4 (four) hours as needed for severe pain (pain score 7-10) or moderate pain (pain score 4-6). 30 tablet 0 12/25/2023   insulin  aspart (NOVOLOG ) 100 UNIT/ML injection Inject 3 Units into the skin 3 (three) times daily with meals.   12/25/2023   insulin  aspart (NOVOLOG ) 100 UNIT/ML injection Inject 0-20 Units into the skin every 4 (four) hours.   12/25/2023   insulin  degludec (TRESIBA FLEXTOUCH) 200 UNIT/ML FlexTouch Pen Inject 70 units nightly   12/25/2023   insulin  glargine-yfgn (SEMGLEE ) 100 UNIT/ML injection Inject 0.4 mLs (40 Units total) into the skin at bedtime.   12/25/2023   isosorbide  mononitrate (IMDUR ) 30 MG 24 hr tablet Take 30 mg by mouth daily.   Past Week   losartan  (COZAAR ) 50 MG tablet Take 1 tablet (50 mg total) by mouth daily.   Past Week   magnesium  hydroxide (MILK OF MAGNESIA) 400 MG/5ML suspension Take 30 mLs by mouth at bedtime as needed for mild constipation or moderate constipation.   Past Week   metFORMIN (GLUCOPHAGE) 1000 MG tablet Take 1,000 mg by mouth 2 (two) times daily.    Past Week   metoCLOPramide  (REGLAN ) 5 MG tablet Take  1 tablet (5 mg total) by mouth every 8 (eight) hours as needed for nausea or vomiting.   Past Week   metoprolol  succinate (TOPROL -XL) 50 MG 24 hr tablet Take 50 mg by mouth daily. Take with or immediately following a meal.   Past Week   Multiple Vitamins-Minerals (MULTIVITAMIN WITH MINERALS) tablet Take 1 tablet by mouth 2 (two) times daily.    Past Week   mupirocin  ointment (BACTROBAN ) 2 % SMARTSIG:1 Application Topical 2-3 Times Daily   Past Week   Omega-3 Fatty Acids (FISH OIL) 500 MG CAPS Take 500 mg by mouth 2 (two) times daily.   Past Week   ondansetron  (ZOFRAN -ODT) 4 MG disintegrating tablet Take 1  tablet (4 mg total) by mouth every 8 (eight) hours as needed for nausea or vomiting. 8 tablet 0 Past Week   oxyCODONE -acetaminophen  (PERCOCET) 10-325 MG tablet Take 1 tablet by mouth.   Past Week   pantoprazole  (PROTONIX ) 40 MG tablet Take 1 tablet (40 mg total) by mouth daily. 30 tablet 1 Past Week   pioglitazone  (ACTOS ) 15 MG tablet Take 15 mg by mouth 2 (two) times daily.   Past Week   polyethylene glycol (MIRALAX  / GLYCOLAX ) 17 g packet Take 17 g by mouth daily.   12/25/2023   prasugrel  (EFFIENT ) 10 MG TABS tablet Take 1 tablet (10 mg total) by mouth daily. 30 tablet  12/25/2023   Fluticasone-Umeclidin-Vilant 200-62.5-25 MCG/INH AEPB Inhale into the lungs.      montelukast (SINGULAIR) 10 MG tablet Take by mouth.      naloxone (NARCAN) nasal spray 4 mg/0.1 mL Place into the nose.      sucralfate (CARAFATE) 1 GM/10ML suspension Take 1 g by mouth 4 (four) times daily -  with meals and at bedtime. (Patient not taking: Reported on 12/26/2023)   Completed Course   tadalafil (CIALIS) 20 MG tablet Take 20 mg by mouth daily as needed for erectile dysfunction.        Allergies  Allergen Reactions   Ibuprofen Shortness Of Breath   Nsaids Swelling    Swelling of throat   Fish Oil Nausea Only, Other (See Comments) and Nausea And Vomiting    Acid reflux     Past Medical History:  Diagnosis Date   Benign enlargement of prostate    Bronchitis    Childhood asthma    Diabetes mellitus without complication (HCC)    Environmental allergies    Erectile dysfunction    Headache    Hemorrhoids    Hypercholesteremia    Hypertension    Hypogonadism in male    Lumbago    Over weight     Review of systems:  Otherwise negative.    Physical Exam  Gen: Alert, oriented. Appears stated age.  HEENT: PERRLA. Lungs: No respiratory distress CV: RRR Abd: soft, benign, no masses Ext: No edema    Planned procedures: Proceed with EGD/colonoscopy. The patient understands the nature of the planned  procedure, indications, risks, alternatives and potential complications including but not limited to bleeding, infection, perforation, damage to internal organs and possible oversedation/side effects from anesthesia. The patient agrees and gives consent to proceed.  Please refer to procedure notes for findings, recommendations and patient disposition/instructions.     Gerald [name], MD [facility]

## 2023-12-28 NOTE — Anesthesia Postprocedure Evaluation (Signed)
 Anesthesia Post Note  Patient: Gerald Ellis  Procedure(s) Performed: COLONOSCOPY EGD (ESOPHAGOGASTRODUODENOSCOPY) POLYPECTOMY, INTESTINE CONTROL OF HEMORRHAGE, GI TRACT, ENDOSCOPIC, BY CLIPPING OR OVERSEWING  Patient location during evaluation: Endoscopy Anesthesia Type: General Level of consciousness: awake and alert Pain management: pain level controlled Vital Signs Assessment: post-procedure vital signs reviewed and stable Respiratory status: spontaneous breathing, nonlabored ventilation, respiratory function stable and patient connected to nasal cannula oxygen  Cardiovascular status: blood pressure returned to baseline and stable Postop Assessment: no apparent nausea or vomiting Anesthetic complications: no   No notable events documented.   Last Vitals:  Vitals:   12/26/23 0917 12/26/23 0927  BP: 108/75 (!) 147/81  Pulse: 94 91  Resp: 16 18  Temp:    SpO2: 100% 100%    Last Pain:  Vitals:   12/26/23 0927  TempSrc:   PainSc: 0-No pain                 Prentice Murphy

## 2023-12-29 LAB — SURGICAL PATHOLOGY

## 2023-12-31 DIAGNOSIS — Z89511 Acquired absence of right leg below knee: Secondary | ICD-10-CM | POA: Diagnosis not present

## 2024-01-02 ENCOUNTER — Other Ambulatory Visit (INDEPENDENT_AMBULATORY_CARE_PROVIDER_SITE_OTHER): Payer: Self-pay | Admitting: Nurse Practitioner

## 2024-01-02 ENCOUNTER — Telehealth (INDEPENDENT_AMBULATORY_CARE_PROVIDER_SITE_OTHER): Payer: Self-pay

## 2024-01-02 DIAGNOSIS — Z89511 Acquired absence of right leg below knee: Secondary | ICD-10-CM | POA: Diagnosis not present

## 2024-01-02 NOTE — Telephone Encounter (Signed)
 Referral placed.

## 2024-01-02 NOTE — Telephone Encounter (Signed)
 Patient left a message requesting referral to be sent to [facility] outpatient physical therapy. Patient has received his prosthetic .Please Advise

## 2024-01-02 NOTE — Telephone Encounter (Signed)
 Left a detailed message on patient voicemail that referral has been sent

## 2024-01-08 ENCOUNTER — Ambulatory Visit: Attending: Nurse Practitioner

## 2024-01-08 DIAGNOSIS — R269 Unspecified abnormalities of gait and mobility: Secondary | ICD-10-CM | POA: Insufficient documentation

## 2024-01-08 DIAGNOSIS — Z89511 Acquired absence of right leg below knee: Secondary | ICD-10-CM | POA: Insufficient documentation

## 2024-01-08 DIAGNOSIS — R262 Difficulty in walking, not elsewhere classified: Secondary | ICD-10-CM | POA: Insufficient documentation

## 2024-01-08 DIAGNOSIS — R278 Other lack of coordination: Secondary | ICD-10-CM | POA: Diagnosis not present

## 2024-01-08 DIAGNOSIS — M6281 Muscle weakness (generalized): Secondary | ICD-10-CM | POA: Insufficient documentation

## 2024-01-08 DIAGNOSIS — R2689 Other abnormalities of gait and mobility: Secondary | ICD-10-CM | POA: Diagnosis not present

## 2024-01-08 NOTE — Therapy (Signed)
 OUTPATIENT PHYSICAL THERAPY LOWER EXTREMITY EVALUATION   Patient Name: Gerald Ellis MRN: [id number] DOB:28-Apr-1966, 58 y.o., male Today's Date: 01/09/2024  END OF SESSION:  PT End of Session - 01/08/24 1414     Visit Number 1    Number of Visits 24    Date for PT Re-Evaluation 04/01/24    Authorization Type HTA    Authorization Time Period CERT- 1/78/7974-88/86/7974    Progress Note Due on Visit 10    PT Start Time 1400    PT Stop Time 1444    PT Time Calculation (min) 44 min    Equipment Utilized During Treatment Gait belt    Activity Tolerance Patient limited by pain;Patient tolerated treatment well    Behavior During Therapy [facility] for tasks assessed/performed          Past Medical History:  Diagnosis Date   Benign enlargement of prostate    Bronchitis    Childhood asthma    Diabetes mellitus without complication (HCC)    Environmental allergies    Erectile dysfunction    Headache    Hemorrhoids    Hypercholesteremia    Hypertension    Hypogonadism in male    Lumbago    Over weight    Past Surgical History:  Procedure Laterality Date   AMPUTATION Right 05/15/2023   Procedure: AMPUTATION BELOW KNEE;  Surgeon: Marea Selinda RAMAN, MD;  Location: [facility] ORS;  Service: General;  Laterality: Right;   AMPUTATION Right 05/16/2023   Procedure: REVISION OF BELOW KNEE AMPUTATION;  Surgeon: Marea Selinda RAMAN, MD;  Location: [facility];  Service: General;  Laterality: Right;   BACK SURGERY  1992   CHOLECYSTECTOMY     COLONOSCOPY N/A 12/26/2023   Procedure: COLONOSCOPY;  Surgeon: Maryruth Ole DASEN, MD;  Location: [facility] ENDOSCOPY;  Service: Endoscopy;  Laterality: N/A;   COLONOSCOPY WITH PROPOFOL  N/A 08/07/2015   Procedure: COLONOSCOPY WITH PROPOFOL ;  Surgeon: Gladis RAYMOND Mariner, MD;  Location: [facility] ENDOSCOPY;  Service: Endoscopy;  Laterality: N/A;   COLONOSCOPY WITH PROPOFOL  N/A 08/08/2015   Procedure: COLONOSCOPY WITH PROPOFOL ;  Surgeon: Gladis RAYMOND Mariner, MD;  Location: [facility] ENDOSCOPY;   Service: Endoscopy;  Laterality: N/A;   COLONOSCOPY WITH PROPOFOL  N/A 09/16/2017   Procedure: COLONOSCOPY WITH PROPOFOL ;  Surgeon: Mariner Gladis RAYMOND, MD;  Location: [facility] ENDOSCOPY;  Service: Endoscopy;  Laterality: N/A;   COLONOSCOPY WITH PROPOFOL  N/A 12/20/2020   Procedure: COLONOSCOPY WITH PROPOFOL ;  Surgeon: Dessa Reyes ORN, MD;  Location: [facility] ENDOSCOPY;  Service: Endoscopy;  Laterality: N/A;  IDDM   CORONARY STENT INTERVENTION N/A 04/20/2019   Procedure: CORONARY STENT INTERVENTION;  Surgeon: Ammon Blunt, MD;  Location: [facility] INVASIVE CV LAB;  Service: Cardiovascular;  Laterality: N/A;   ESOPHAGOGASTRODUODENOSCOPY N/A 12/26/2023   Procedure: EGD (ESOPHAGOGASTRODUODENOSCOPY);  Surgeon: Maryruth Ole DASEN, MD;  Location: [facility] ENDOSCOPY;  Service: Endoscopy;  Laterality: N/A;   ESOPHAGOGASTRODUODENOSCOPY (EGD) WITH PROPOFOL  N/A 08/07/2015   Procedure: ESOPHAGOGASTRODUODENOSCOPY (EGD) WITH PROPOFOL ;  Surgeon: Gladis RAYMOND Mariner, MD;  Location: [facility] ENDOSCOPY;  Service: Endoscopy;  Laterality: N/A;   ESOPHAGOGASTRODUODENOSCOPY (EGD) WITH PROPOFOL  N/A 09/16/2017   Procedure: ESOPHAGOGASTRODUODENOSCOPY (EGD) WITH PROPOFOL ;  Surgeon: Mariner Gladis RAYMOND, MD;  Location: [facility] ENDOSCOPY;  Service: Endoscopy;  Laterality: N/A;   ESOPHAGOGASTRODUODENOSCOPY (EGD) WITH PROPOFOL  N/A 12/20/2020   Procedure: ESOPHAGOGASTRODUODENOSCOPY (EGD) WITH PROPOFOL ;  Surgeon: Dessa Reyes ORN, MD;  Location: [facility] ENDOSCOPY;  Service: Endoscopy;  Laterality: N/A;   HEMOSTASIS CLIP PLACEMENT  12/26/2023   Procedure: CONTROL OF HEMORRHAGE, GI TRACT, ENDOSCOPIC, BY CLIPPING  OR OVERSEWING;  Surgeon: Maryruth Ole DASEN, MD;  Location: [facility] ENDOSCOPY;  Service: Endoscopy;;   IRRIGATION AND DEBRIDEMENT FOOT Right 05/12/2023   Procedure: IRRIGATION AND DEBRIDEMENT FOOT, WASH OUT;  Surgeon: Neill Boas, DPM;  Location: [facility] ORS;  Service: Orthopedics/Podiatry;  Laterality: Right;   LEFT HEART CATH AND CORONARY ANGIOGRAPHY Left  04/20/2019   Procedure: LEFT HEART CATH AND CORONARY ANGIOGRAPHY;  Surgeon: Ammon Blunt, MD;  Location: [facility] INVASIVE CV LAB;  Service: Cardiovascular;  Laterality: Left;   LOWER EXTREMITY ANGIOGRAPHY Right 05/09/2023   Procedure: Lower Extremity Angiography;  Surgeon: Jama Cordella MATSU, MD;  Location: [facility] INVASIVE CV LAB;  Service: Cardiovascular;  Laterality: Right;   POLYPECTOMY  12/26/2023   Procedure: POLYPECTOMY, INTESTINE;  Surgeon: Maryruth Ole DASEN, MD;  Location: [facility] ENDOSCOPY;  Service: Endoscopy;;   TRANSMETATARSAL AMPUTATION Right 05/08/2023   Procedure: TRANSMETATARSAL AMPUTATION;  Surgeon: Neill Boas, DPM;  Location: [facility];  Service: Orthopedics/Podiatry;  Laterality: Right;   Patient Active Problem List   Diagnosis Date Noted   Hx of BKA, right (HCC) 09/02/2023   Diabetic foot infection (HCC) 05/07/2023   Sepsis (HCC) 05/07/2023   Chronic back pain 05/07/2023   Anxiety 05/07/2023   Obesity, Class III, BMI 40-49.9 (morbid obesity) 05/07/2023   Uncontrolled type 2 diabetes mellitus with hyperglycemia, with long-term current use of insulin  (HCC) 05/07/2023   Genetic testing 04/09/2022   Chronic venous insufficiency 06/29/2020   Lymphedema 06/29/2020   PAD (peripheral artery disease) (HCC) 05/30/2020   Ankle ulcer, right, with fat layer exposed (HCC) 05/30/2020   Allergy 05/29/2020   Asthma without status asthmaticus 05/29/2020   Diabetes mellitus type 2, uncomplicated (HCC) 05/29/2020   S/P coronary artery stent placement 04/30/2019   CAD S/P percutaneous coronary angioplasty 04/20/2019   Abnormal ECG 03/18/2019   Chest pain with high risk for cardiac etiology 03/18/2019   SOB (shortness of breath) on exertion 03/18/2019   Hypercholesterolemia 05/07/2015   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   Hypogonadism in male 11/22/2014   BPH with obstruction/lower urinary tract symptoms 11/22/2014   DDD (degenerative disc disease), lumbar 10/13/2014    Status post lumbar laminectomy 10/13/2014   DJD of shoulder 10/13/2014   Bilateral occipital neuralgia 10/13/2014   Lumbar radiculopathy 10/13/2014   DDD (degenerative disc disease), cervical 10/13/2014   Depression 08/25/2013   HTN (hypertension) 08/25/2013   History of hemorrhoids 08/25/2013   Low back pain 08/25/2013    PCP: Dr. Lynwood Null   REFERRING PROVIDER: Orvin Daring, NP  REFERRING DIAG: [phone] (ICD-10-CM) - Hx of BKA, right (HCC)   THERAPY DIAG:  Abnormality of gait and mobility - Plan: PT plan of care cert/re-cert  Difficulty in walking, not elsewhere classified - Plan: PT plan of care cert/re-cert  Muscle weakness (generalized) - Plan: PT plan of care cert/re-cert  Other abnormalities of gait and mobility - Plan: PT plan of care cert/re-cert  Other lack of coordination - Plan: PT plan of care cert/re-cert  Rationale for Evaluation and Treatment: Rehabilitation  ONSET DATE: 05/15/2023  SUBJECTIVE:   SUBJECTIVE STATEMENT: Patient reports having BKA in December 2024 and just received his Prosthesis last week through [facility]. He states has been told to wear for 1-2 hours/per day for now and states doing okay- feels like the prosthesis is heavy but trying to get use to it. Reports having increased difficulty straightening knee as well. Reports here to learn how to walk well with his prosthesis. States has not really been doing any home exercises  and no formal training in exercises.   PERTINENT HISTORY: Patient is a 58 year old male with R BKA on 05/15/2023 and revision of R BKA on 05/16/2023. PMH: with medical history HTN, HLD, CAD s/p stent, DM with neuropathy, chronic back pain on opiates,, anxiety,  PAIN:  Are you having pain? Yes: NPRS scale: Did not rate- will reassess visit #2 Pain location: R Residual limb along anterior and distal to knee Pain description: phantom pain  Aggravating factors: weightbearing or attempting to straighten knee Relieving  factors: Rest  PRECAUTIONS: Fall  RED FLAGS: None   WEIGHT BEARING RESTRICTIONS: No  FALLS:  Has patient fallen in last 6 months? No  LIVING ENVIRONMENT: Lives with: lives with their spouse Lives in: House/apartment Stairs:  has aluminum ramp in front Has following equipment at home: Vannie - 2 wheeled, Environmental consultant - 4 wheeled, Wheelchair (manual), Tour manager, and Ramped entry  OCCUPATION: on Disability  PLOF: Independent  PATIENT GOALS: See if I can stand on my leg without buckling and improve my pain and walk.   NEXT MD VISIT: 01/28/2024- Prosthetic check up  OBJECTIVE:  Note: Objective measures were completed at Evaluation unless otherwise noted.  DIAGNOSTIC FINDINGS: None present in chart since BKA  PATIENT SURVEYS:    COGNITION: Overall cognitive status: Within functional limits for tasks assessed     SENSATION: Light touch: WFL- Left LE (thigh and lower leg) - did not assess foot today- patient wearing shoe  EDEMA:  None   POSTURE: rounded shoulders, forward head, and Contracted right knee- lacking terminal knee ext AROM  PALPATION: Tender along anterior patella and inferior along lateral distal portion of residual limb  LOWER EXTREMITY ROM:  Active ROM Right eval Left eval  Hip flexion    Hip extension    Hip abduction    Hip adduction    Hip internal rotation    Hip external rotation    Knee flexion    Knee extension    Ankle dorsiflexion    Ankle plantarflexion    Ankle inversion    Ankle eversion     (Blank rows = not tested)  LOWER EXTREMITY MMT:  MMT Right eval Left eval  Hip flexion 3+ * 4+  Hip extension    Hip abduction 3+ 5  Hip adduction    Hip internal rotation    Hip external rotation    Knee flexion 4 4+  Knee extension 4 in available AROM 5  Ankle dorsiflexion  5  Ankle plantarflexion    Ankle inversion    Ankle eversion     (Blank rows = not tested)  LOWER EXTREMITY SPECIAL TESTS:    FUNCTIONAL TESTS:  5  times sit to stand: to be assessed visit #2  GAIT: Distance walked: 6 feet in // bars with BUE Support Assistive device utilized: // bars Level of assistance: CGA Comments: Short step to gait- some antalgia with RLE and decreased ability to bear weight through RLE.  Prosthetic observation:  - Patient able to don/doff R prosthesis independently  Manual w/c negotiation- Patient able to navigate his manual w/c well with good UE propulsion- did not bring or use any leg rest today.  TREATMENT DATE: 01/08/2024  PT EVALUATION  Self care/home management:  Reviewed importance of wear schedule set by his Prosthetist and to perform daily inspection of skin immediately after doffing prosthesis to prevent risk of wound.  Educated in importance of daily stretching to try to normalize ROM and importance of LE strengthening for hip/knee joint stability and overall function.  See below section for added HEP today.    PATIENT EDUCATION:  Education details: Purpose of PT; Proposed Plan of care; Education on new prosthesis and how to reduce risk of infection/wound. Ed in [employer] Person educated: Patient Education method: Programmer, multimedia, Facilities manager, Actor cues, Verbal cues, and Handouts Education comprehension: verbalized understanding, returned demonstration, verbal cues required, tactile cues required, and needs further education  HOME EXERCISE PROGRAM: Access Code: [email or web address] URL: [email or web address] Date: 01/09/2024 Prepared by: Reyes London  Exercises - Supine Quad Set (BKA)  - 1 x daily - 3 sets - 10 reps - 5 hold - Sidelying Hip Abduction wtih Flexion and Extension (BKA)  - 1 x daily - 3 sets - 10 reps - Sidelying Hip Abduction (BKA)  - 1 x daily - 3 sets - 10 reps - Sidelying Isometric Hip Adduction (BKA)  - 1 x daily - 3 sets - 10  reps  ASSESSMENT:  CLINICAL IMPRESSION: Patient is a 58 y.o. male who was seen today for physical therapy evaluation and treatment for R BKA. He reports receiving his prosthesis a little over a week ago and presents today with limited knowledge of weight bearing/walking as well as RLE muscle weakness, pain with weight bearing and mobility. He will benefit from skilled PT to help with prosthetic training and overall RLE strengthening, balance and overall endurance training to improve his functional independence and decrease his risk of re-hospitalization or falls.    OBJECTIVE IMPAIRMENTS: Abnormal gait, cardiopulmonary status limiting activity, decreased activity tolerance, decreased balance, decreased coordination, decreased endurance, decreased knowledge of use of DME, decreased mobility, difficulty walking, decreased ROM, decreased strength, prosthetic dependency , and pain.   ACTIVITY LIMITATIONS: carrying, lifting, bending, sitting, standing, squatting, sleeping, stairs, transfers, and bed mobility  PARTICIPATION LIMITATIONS: meal prep, cleaning, laundry, driving, shopping, community activity, and yard work  PERSONAL FACTORS: 1-2 comorbidities: HTN, DM are also affecting patient's functional outcome.   REHAB POTENTIAL: Good  CLINICAL DECISION MAKING: Stable/uncomplicated  EVALUATION COMPLEXITY: Low   GOALS: Goals reviewed with patient? Yes  SHORT TERM GOALS: Target date: 02/20/2024 Pt will be independent with HEP in order to improve strength and balance in order to decrease fall risk and improve function at home and work.  Baseline:EVAL - No formal HEP in place Goal status: INITIAL  LONG TERM GOALS: Target date: 04/01/2024  Patient will present with no signs or symptoms of infection or wound with R residual limb Baseline: EVAL- no sign of symptom of any wound infection Goal status: INITIAL  2.  Patient will perform static stand with 50/50 weight bearing without UE Support  consistently without assist while performing static standing without report of increased pain.  Baseline: EVAL- Patient with difficulty weight bearing toward affected right side.  Goal status: INITIAL  3.  Patient will ambulated > 500 feet with least restrictive AD on all surfaces with modified independence  for improved normalized gait pattern with less chance of fatigue/LBP.  Baseline: EVAL- 6 steps in // bars with step to gait Goal status: INITIAL  4.  Patient will be to perform all household ADLs and chores with R prosthesis donned at Modified independent level  for return to independent function in home.  Baseline: Limited - having to modify how he performs ADL's and housework due to not standing Goal status: INITIAL    PLAN:  PT FREQUENCY: 1-2x/week  PT DURATION: 12 weeks  PLANNED INTERVENTIONS: [id number]- PT Re-evaluation, 97750- Physical Performance Testing, 97110-Therapeutic exercises, 97530- Therapeutic activity, [id number]- Neuromuscular re-education, 97535- Self Care, [id number]- Manual therapy, [id number]- Gait training, [id number]- Prosthetic Initial , [id number]- Orthotic/Prosthetic subsequent, [phone]- Canalith repositioning, [id number]- Electrical stimulation (manual), [phone] (1-2 muscles), [id number] (3+ muscles)- Dry Needling, Patient/Family education, Balance training, Stair training, Taping, Joint mobilization, Joint manipulation, Spinal manipulation, Spinal mobilization, Scar mobilization, Compression bandaging, Vestibular training, DME instructions, Wheelchair mobility training, Cryotherapy, and Moist heat  PLAN FOR NEXT SESSION:  Review and progress RLE therex in supine, sidelye, seated, and standing. Begin prosthetic Pre-gait standing and gait training as appropriate. Static and dynamic weight shifting.  Add to HEP as appropriate.    Reyes LOISE London, PT 01/09/2024, 12:02 PM

## 2024-01-12 ENCOUNTER — Ambulatory Visit

## 2024-01-12 DIAGNOSIS — R2689 Other abnormalities of gait and mobility: Secondary | ICD-10-CM

## 2024-01-12 DIAGNOSIS — R262 Difficulty in walking, not elsewhere classified: Secondary | ICD-10-CM

## 2024-01-12 DIAGNOSIS — R269 Unspecified abnormalities of gait and mobility: Secondary | ICD-10-CM | POA: Diagnosis not present

## 2024-01-12 DIAGNOSIS — M6281 Muscle weakness (generalized): Secondary | ICD-10-CM

## 2024-01-12 DIAGNOSIS — R278 Other lack of coordination: Secondary | ICD-10-CM

## 2024-01-12 NOTE — Therapy (Addendum)
 OUTPATIENT PHYSICAL THERAPY TREATMENT  Patient Name: Gerald Ellis MRN: [id number] DOB:1965/06/30, 58 y.o., male Today's Date: 01/12/2024  END OF SESSION:  PT End of Session - 01/12/24 1540     Visit Number 2    Number of Visits 24    Date for PT Re-Evaluation 04/01/24    Authorization Type HTA    Authorization Time Period CERT- 1/78/7974-88/86/7974    Progress Note Due on Visit 10    PT Start Time 1535    PT Stop Time 1613    PT Time Calculation (min) 38 min    Equipment Utilized During Treatment Gait belt    Activity Tolerance Patient tolerated treatment well;No increased pain    Behavior During Therapy WFL for tasks assessed/performed          Past Medical History:  Diagnosis Date   Benign enlargement of prostate    Bronchitis    Childhood asthma    Diabetes mellitus without complication (HCC)    Environmental allergies    Erectile dysfunction    Headache    Hemorrhoids    Hypercholesteremia    Hypertension    Hypogonadism in male    Lumbago    Over weight    Past Surgical History:  Procedure Laterality Date   AMPUTATION Right 05/15/2023   Procedure: AMPUTATION BELOW KNEE;  Surgeon: Marea Selinda RAMAN, MD;  Location: [facility];  Service: General;  Laterality: Right;   AMPUTATION Right 05/16/2023   Procedure: REVISION OF BELOW KNEE AMPUTATION;  Surgeon: Marea Selinda RAMAN, MD;  Location: [facility];  Service: General;  Laterality: Right;   BACK SURGERY  1992   CHOLECYSTECTOMY     COLONOSCOPY N/A 12/26/2023   Procedure: COLONOSCOPY;  Surgeon: Maryruth Ole DASEN, MD;  Location: [facility];  Service: Endoscopy;  Laterality: N/A;   COLONOSCOPY WITH PROPOFOL  N/A 08/07/2015   Procedure: COLONOSCOPY WITH PROPOFOL ;  Surgeon: Gladis RAYMOND Mariner, MD;  Location: [facility] ENDOSCOPY;  Service: Endoscopy;  Laterality: N/A;   COLONOSCOPY WITH PROPOFOL  N/A 08/08/2015   Procedure: COLONOSCOPY WITH PROPOFOL ;  Surgeon: Gladis RAYMOND Mariner, MD;  Location: [facility] ENDOSCOPY;  Service: Endoscopy;   Laterality: N/A;   COLONOSCOPY WITH PROPOFOL  N/A 09/16/2017   Procedure: COLONOSCOPY WITH PROPOFOL ;  Surgeon: Mariner Gladis RAYMOND, MD;  Location: [facility] ENDOSCOPY;  Service: Endoscopy;  Laterality: N/A;   COLONOSCOPY WITH PROPOFOL  N/A 12/20/2020   Procedure: COLONOSCOPY WITH PROPOFOL ;  Surgeon: Dessa Reyes ORN, MD;  Location: [facility] ENDOSCOPY;  Service: Endoscopy;  Laterality: N/A;  IDDM   CORONARY STENT INTERVENTION N/A 04/20/2019   Procedure: CORONARY STENT INTERVENTION;  Surgeon: Ammon Blunt, MD;  Location: [facility];  Service: Cardiovascular;  Laterality: N/A;   ESOPHAGOGASTRODUODENOSCOPY N/A 12/26/2023   Procedure: EGD (ESOPHAGOGASTRODUODENOSCOPY);  Surgeon: Maryruth Ole DASEN, MD;  Location: [facility] ENDOSCOPY;  Service: Endoscopy;  Laterality: N/A;   ESOPHAGOGASTRODUODENOSCOPY (EGD) WITH PROPOFOL  N/A 08/07/2015   Procedure: ESOPHAGOGASTRODUODENOSCOPY (EGD) WITH PROPOFOL ;  Surgeon: Gladis RAYMOND Mariner, MD;  Location: [facility] ENDOSCOPY;  Service: Endoscopy;  Laterality: N/A;   ESOPHAGOGASTRODUODENOSCOPY (EGD) WITH PROPOFOL  N/A 09/16/2017   Procedure: ESOPHAGOGASTRODUODENOSCOPY (EGD) WITH PROPOFOL ;  Surgeon: Mariner Gladis RAYMOND, MD;  Location: [facility] ENDOSCOPY;  Service: Endoscopy;  Laterality: N/A;   ESOPHAGOGASTRODUODENOSCOPY (EGD) WITH PROPOFOL  N/A 12/20/2020   Procedure: ESOPHAGOGASTRODUODENOSCOPY (EGD) WITH PROPOFOL ;  Surgeon: Dessa Reyes ORN, MD;  Location: [facility] ENDOSCOPY;  Service: Endoscopy;  Laterality: N/A;   HEMOSTASIS CLIP PLACEMENT  12/26/2023   Procedure: CONTROL OF HEMORRHAGE, GI TRACT, ENDOSCOPIC, BY CLIPPING OR OVERSEWING;  Surgeon:  Maryruth Ole DASEN, MD;  Location: [facility] ENDOSCOPY;  Service: Endoscopy;;   IRRIGATION AND DEBRIDEMENT FOOT Right 05/12/2023   Procedure: IRRIGATION AND DEBRIDEMENT FOOT, WASH OUT;  Surgeon: Neill Boas, DPM;  Location: [facility];  Service: Orthopedics/Podiatry;  Laterality: Right;   LEFT HEART CATH AND CORONARY ANGIOGRAPHY Left 04/20/2019    Procedure: LEFT HEART CATH AND CORONARY ANGIOGRAPHY;  Surgeon: Ammon Blunt, MD;  Location: [facility];  Service: Cardiovascular;  Laterality: Left;   LOWER EXTREMITY ANGIOGRAPHY Right 05/09/2023   Procedure: Lower Extremity Angiography;  Surgeon: Jama Cordella MATSU, MD;  Location: [facility];  Service: Cardiovascular;  Laterality: Right;   POLYPECTOMY  12/26/2023   Procedure: POLYPECTOMY, INTESTINE;  Surgeon: Maryruth Ole DASEN, MD;  Location: [facility] ENDOSCOPY;  Service: Endoscopy;;   TRANSMETATARSAL AMPUTATION Right 05/08/2023   Procedure: TRANSMETATARSAL AMPUTATION;  Surgeon: Neill Boas, DPM;  Location: [facility];  Service: Orthopedics/Podiatry;  Laterality: Right;   Patient Active Problem List   Diagnosis Date Noted   Hx of BKA, right (HCC) 09/02/2023   Diabetic foot infection (HCC) 05/07/2023   Sepsis (HCC) 05/07/2023   Chronic back pain 05/07/2023   Anxiety 05/07/2023   Obesity, Class III, BMI 40-49.9 (morbid obesity) 05/07/2023   Uncontrolled type 2 diabetes mellitus with hyperglycemia, with long-term current use of insulin  (HCC) 05/07/2023   Genetic testing 04/09/2022   Chronic venous insufficiency 06/29/2020   Lymphedema 06/29/2020   PAD (peripheral artery disease) (HCC) 05/30/2020   Ankle ulcer, right, with fat layer exposed (HCC) 05/30/2020   Allergy 05/29/2020   Asthma without status asthmaticus 05/29/2020   Diabetes mellitus type 2, uncomplicated (HCC) 05/29/2020   S/P coronary artery stent placement 04/30/2019   CAD S/P percutaneous coronary angioplasty 04/20/2019   Abnormal ECG 03/18/2019   Chest pain with high risk for cardiac etiology 03/18/2019   SOB (shortness of breath) on exertion 03/18/2019   Hypercholesterolemia 05/07/2015   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   Hypogonadism in male 11/22/2014   BPH with obstruction/lower urinary tract symptoms 11/22/2014   DDD (degenerative disc disease), lumbar 10/13/2014   Status post  lumbar laminectomy 10/13/2014   DJD of shoulder 10/13/2014   Bilateral occipital neuralgia 10/13/2014   Lumbar radiculopathy 10/13/2014   DDD (degenerative disc disease), cervical 10/13/2014   Depression 08/25/2013   HTN (hypertension) 08/25/2013   History of hemorrhoids 08/25/2013   Low back pain 08/25/2013    PCP: Dr. Lynwood Null   REFERRING PROVIDER: Orvin Daring, NP  REFERRING DIAG: [phone] (ICD-10-CM) - Hx of BKA, right (HCC)   THERAPY DIAG:  Abnormality of gait and mobility  Difficulty in walking, not elsewhere classified  Muscle weakness (generalized)  Other abnormalities of gait and mobility  Other lack of coordination  Rationale for Evaluation and Treatment: Rehabilitation  ONSET DATE: 05/15/2023  SUBJECTIVE:   SUBJECTIVE STATEMENT: Pt continues to wear socket 2 hours daily. No difficulty with HEP assignments.  PERTINENT HISTORY: Patient is a 58 year old male with R BKA on 05/15/2023 and revision of R BKA on 05/16/2023. PMH: with medical history HTN, HLD, CAD s/p stent, DM with neuropathy, chronic back pain on opiates,, anxiety. Patient reports having BKA in December 2024 and just received his Prosthesis last week through [employer]. He states has been told to wear for 1-2 hours/per day for now and states doing okay- feels like the prosthesis is heavy but trying to get use to it. Reports having increased difficulty straightening knee as well. Reports here to learn how to  walk well with his prosthesis. States has not really been doing any home exercises and no formal training in exercises.  PAIN:  Are you having pain? None at present- still has some upon putting socket on, but improved with time.   PRECAUTIONS: Fall  WEIGHT BEARING RESTRICTIONS: No  FALLS:  Has patient fallen in last 6 months? No  LIVING ENVIRONMENT: Lives with: lives with their spouse Lives in: House/apartment Stairs:  has aluminum ramp in front Has following equipment at home: Vannie - 2  wheeled, Environmental consultant - 4 wheeled, Wheelchair (manual), Tour manager, and Ramped entry  OCCUPATION: on Disability  PLOF: Independent  PATIENT GOALS: See if I can stand on my leg without buckling and improve my pain and walk.   NEXT MD VISIT: 01/28/2024- Prosthetic check up  OBJECTIVE:                                                                                                                                TREATMENT DATE: 01/12/24  -inspection of limb; heavy sweating, dried with cloth, lateral folds as pictured, no open wounds or hotspots -supervision level transfer from WC to elevated plinth  -RLE LAQ 1x15 x3secH (lacking >20 degrees)  -RLE Knee flexion A/ROM 1x15x3secH (close to 90 degrees but not quite)  -STS from 25 to SW, then lateral weight shifting in shoulder stance  -STS from 25 surface 2x8 with emphasis on pushing through RLE -STS RLE lateral step over and medial step over x12 (pain in anterior tibial surface)   PATIENT EDUCATION:  Education details: Purpose of PT; Proposed Plan of care; Education on new prosthesis and how to reduce risk of infection/wound. Ed in [employer] Person educated: Patient Education method: Programmer, multimedia, Facilities manager, Actor cues, Verbal cues, and Handouts Education comprehension: verbalized understanding, returned demonstration, verbal cues required, tactile cues required, and needs further education  HOME EXERCISE PROGRAM: Access Code: [email or web address] URL: [email or web address] Date: 01/12/2024 Prepared by: Peggye Linear  Exercises - Supine Quad Set (BKA)  - 1 x daily - 3 sets - 10 reps - 5 hold - Sidelying Hip Abduction wtih Flexion and Extension (BKA)  - 1 x daily - 3 sets - 10 reps - Sidelying Hip Abduction (BKA)  - 1 x daily - 3 sets - 10 reps - Sidelying Isometric Hip Adduction (BKA)  - 1 x daily - 3 sets - 10 reps - Standing Weight Shift  - 1 x daily - 3 sets - 10 reps - Standing forward/backward weight shift with support   - 1 x  daily - 3 sets - 10 reps - Seated Long Arc Quad  - 1 x daily - 3 sets - 10 reps  ASSESSMENT:  CLINICAL IMPRESSION: Pt updated with HEP to include seated and standing weight shifting. Knee contracture is causing excess ing anterior tibial weightbearing which is not especially comfortable in higher volumes. Pt does well with balance in standing using walker. Asked to increased socket wear time  to 3 hours daily starting tomorrow, but dry limb between if sweaty. He will benefit from skilled PT to help with prosthetic training and overall RLE strengthening, balance and overall endurance training to improve his functional independence and decrease his risk of re-hospitalization or falls.    OBJECTIVE IMPAIRMENTS: Abnormal gait, cardiopulmonary status limiting activity, decreased activity tolerance, decreased balance, decreased coordination, decreased endurance, decreased knowledge of use of DME, decreased mobility, difficulty walking, decreased ROM, decreased strength, prosthetic dependency , and pain.   ACTIVITY LIMITATIONS: carrying, lifting, bending, sitting, standing, squatting, sleeping, stairs, transfers, and bed mobility  PARTICIPATION LIMITATIONS: meal prep, cleaning, laundry, driving, shopping, community activity, and yard work  PERSONAL FACTORS: 1-2 comorbidities: HTN, DM are also affecting patient's functional outcome.   REHAB POTENTIAL: Good  CLINICAL DECISION MAKING: Stable/uncomplicated  EVALUATION COMPLEXITY: Low  GOALS: Goals reviewed with patient? Yes  SHORT TERM GOALS: Target date: 02/20/2024 Pt will be independent with HEP in order to improve strength and balance in order to decrease fall risk and improve function at home and work.  Baseline:EVAL - No formal HEP in place Goal status: INITIAL  LONG TERM GOALS: Target date: 04/01/2024  Patient will present with no signs or symptoms of infection or wound with R residual limb Baseline: EVAL- no sign of symptom of any wound  infection Goal status: INITIAL  2.  Patient will perform static stand with 50/50 weight bearing without UE Support consistently without assist while performing static standing without report of increased pain.  Baseline: EVAL- Patient with difficulty weight bearing toward affected right side.  Goal status: INITIAL  3.  Patient will ambulated > 500 feet with least restrictive AD on all surfaces with modified independence  for improved normalized gait pattern with less chance of fatigue/LBP.  Baseline: EVAL- 6 steps in // bars with step to gait Goal status: INITIAL  4.  Patient will be to perform all household ADLs and chores with R prosthesis donned at Modified independent level for return to independent function in home.  Baseline: Limited - having to modify how he performs ADL's and housework due to not standing Goal status: INITIAL  PLAN:  PT FREQUENCY: 1-2x/week  PT DURATION: 12 weeks  PLANNED INTERVENTIONS: [id number]- PT Re-evaluation, 97750- Physical Performance Testing, 97110-Therapeutic exercises, 97530- Therapeutic activity, [id number]- Neuromuscular re-education, 97535- Self Care, [id number]- Manual therapy, [id number]- Gait training, [phone]- Prosthetic Initial , [id number]- Orthotic/Prosthetic subsequent, [phone]- Canalith repositioning, [id number]- Electrical stimulation (manual), [phone] (1-2 muscles), [id number] (3+ muscles)- Dry Needling, Patient/Family education, Balance training, Stair training, Taping, Joint mobilization, Joint manipulation, Spinal manipulation, Spinal mobilization, Scar mobilization, Compression bandaging, Vestibular training, DME instructions, Wheelchair mobility training, Cryotherapy, and Moist heat  PLAN FOR NEXT SESSION:  Conitnued with Rt knee ROM, transfers, weight shifitng, stance load tolerance.    Keiran Gaffey C, PT 01/12/2024, 3:50 PM   3:50 PM, 01/12/24 Peggye JAYSON Linear, PT, DPT Physical Therapist - [facility]  Outpatient Physical Therapy-  Main [facility] [phone]

## 2024-01-13 ENCOUNTER — Encounter: Admitting: Physical Therapy

## 2024-01-15 ENCOUNTER — Ambulatory Visit: Admitting: Physical Therapy

## 2024-01-15 DIAGNOSIS — R269 Unspecified abnormalities of gait and mobility: Secondary | ICD-10-CM | POA: Diagnosis not present

## 2024-01-15 DIAGNOSIS — R278 Other lack of coordination: Secondary | ICD-10-CM

## 2024-01-15 DIAGNOSIS — R2689 Other abnormalities of gait and mobility: Secondary | ICD-10-CM

## 2024-01-15 DIAGNOSIS — M6281 Muscle weakness (generalized): Secondary | ICD-10-CM

## 2024-01-15 DIAGNOSIS — R262 Difficulty in walking, not elsewhere classified: Secondary | ICD-10-CM

## 2024-01-15 NOTE — Therapy (Signed)
 OUTPATIENT PHYSICAL THERAPY TREATMENT  Patient Name: Gerald Ellis MRN: [id number] DOB:1966-05-15, 58 y.o., male Today's Date: 01/15/2024  END OF SESSION:  PT End of Session - 01/15/24 1152     Visit Number 3    Number of Visits 24    Date for PT Re-Evaluation 04/01/24    Authorization Type HTA    Authorization Time Period CERT- 1/78/[id number]/86/7974    Progress Note Due on Visit 10    PT Start Time 1151    PT Stop Time 1233    PT Time Calculation (min) 42 min    Equipment Utilized During Treatment Gait belt    Activity Tolerance Patient tolerated treatment well;No increased pain    Behavior During Therapy WFL for tasks assessed/performed           Past Medical History:  Diagnosis Date   Benign enlargement of prostate    Bronchitis    Childhood asthma    Diabetes mellitus without complication (HCC)    Environmental allergies    Erectile dysfunction    Headache    Hemorrhoids    Hypercholesteremia    Hypertension    Hypogonadism in male    Lumbago    Over weight    Past Surgical History:  Procedure Laterality Date   AMPUTATION Right 05/15/2023   Procedure: AMPUTATION BELOW KNEE;  Surgeon: Marea Selinda RAMAN, MD;  Location: [facility]S;  Service: General;  Laterality: Right;   AMPUTATION Right 05/16/2023   Procedure: REVISION OF BELOW KNEE AMPUTATION;  Surgeon: Marea Selinda RAMAN, MD;  Location: [facility];  Service: General;  Laterality: Right;   BACK SURGERY  1992   CHOLECYSTECTOMY     COLONOSCOPY N/A 12/26/2023   Procedure: COLONOSCOPY;  Surgeon: Maryruth Ole DASEN, MD;  Location: [facility] ENDOSCOPY;  Service: Endoscopy;  Laterality: N/A;   COLONOSCOPY WITH PROPOFOL  N/A 08/07/2015   Procedure: COLONOSCOPY WITH PROPOFOL ;  Surgeon: Gladis RAYMOND Mariner, MD;  Location: [facility] ENDOSCOPY;  Service: Endoscopy;  Laterality: N/A;   COLONOSCOPY WITH PROPOFOL  N/A 08/08/2015   Procedure: COLONOSCOPY WITH PROPOFOL ;  Surgeon: Gladis RAYMOND Mariner, MD;  Location: [facility] ENDOSCOPY;  Service: Endoscopy;   Laterality: N/A;   COLONOSCOPY WITH PROPOFOL  N/A 09/16/2017   Procedure: COLONOSCOPY WITH PROPOFOL ;  Surgeon: Mariner Gladis RAYMOND, MD;  Location: [facility] END[facility]COPY;  Service: Endoscopy;  Laterality: N/A;   COLONOSCOPY WITH PROPOFOL  N/A 12/20/2020   Procedure: COLONOSCOPY WITH PROPOFOL ;  Surgeon: Dessa Reyes ORN, MD;  Location: [facility] ENDOSCOPY;  Service: Endoscopy;  Laterality: N/A;  IDDM   CORONARY STENT INTERVENTION N/A 04/20/2019   Procedure: CORONARY STENT INTERVENTION;  Surgeon: Ammon Blunt, MD;  Location: [facility] IN[facility]ASIVE CV LAB;  Service: Cardiovascular;  Laterality: N/A;   ESOPHAGOGASTRODUODENOSCOPY N/A 12/26/2023   Procedure: EGD (ESOPHAGOGASTRODUODENOSCOPY);  Surgeon: Maryruth Ole DASEN, MD;  Location: [facility] ENDOSCOPY;  Service: Endoscopy;  Laterality: N/A;   ESOPHAGOGASTRODUODENOSCOPY (EGD) WITH PROPOFOL  N/A 08/07/2015   Procedure: ESOPHAGOGASTRODUODENOSCOPY (EGD) WITH PROPOFOL ;  Surgeon: Gladis RAYMOND Mariner, MD;  Location: [facility] ENDOSCOPY;  Service: Endoscopy;  Laterality: N/A;   ESOPHAGOGASTRODUODENOSCOPY (EGD) WITH PROPOFOL  N/A 09/16/2017   Procedure: ESOPHAGOGASTRODUODENOSCOPY (EGD) WITH PROPOFOL ;  Surgeon: Mariner Gladis RAYMOND, MD;  Location: [facility] ENDOSCOPY;  Service: Endoscopy;  Laterality: N/A;   ESOPHAGOGASTRODUODENOSCOPY (EGD) WITH PROPOFOL  N/A 12/20/2020   Procedure: ESOPHAGOGASTRODUODENOSCOPY (EGD) WITH PROPOFOL ;  Surgeon: Dessa Reyes ORN, MD;  Location: [facility] ENDOSCOPY;  Service: Endoscopy;  Laterality: N/A;   HEMOSTASIS CLIP PLACEMENT  12/26/2023   Procedure: CONTROL OF HEMORRHAGE, GI TRACT, ENDOSCOPIC, BY CLIPPING OR OVERSEWING;  Surgeon: Maryruth Ole DASEN, MD;  Location: [facility] ENDOSCOPY;  Service: Endoscopy;;   IRRIGATION AND DEBRIDEMENT FOOT Right 05/12/2023   Procedure: IRRIGATION AND DEBRIDEMENT FOOT, WASH OUT;  Surgeon: Neill Boas, DPM;  Location: [facility];  Service: Orthopedics/Podiatry;  Laterality: Right;   LEFT HEART CATH AND CORONARY ANGIOGRAPHY Left 04/20/2019    Procedure: LEFT HEART CATH AND CORONARY ANGIOGRAPHY;  Surgeon: Ammon Blunt, MD;  Location: [facility] CV LAB;  Service: Cardiovascular;  Laterality: Left;   LOWER EXTREMITY ANGIOGRAPHY Right 05/09/2023   Procedure: Lower Extremity Angiography;  Surgeon: Jama Cordella MATSU, MD;  Location: [facility];  Service: Cardiovascular;  Laterality: Right;   POLYPECTOMY  12/26/2023   Procedure: POLYPECTOMY, INTESTINE;  Surgeon: Maryruth Ole DASEN, MD;  Location: [facility] ENDOSCOPY;  Service: Endoscopy;;   TRANSMETATARSAL AMPUTATION Right 05/08/2023   Procedure: TRANSMETATARSAL AMPUTATION;  Surgeon: Neill Boas, DPM;  Location: [facility];  Service: Orthopedics/Podiatry;  Laterality: Right;   Patient Active Problem List   Diagnosis Date Noted   Hx of BKA, right (HCC) 09/02/2023   Diabetic foot infection (HCC) 05/07/2023   Sepsis (HCC) 05/07/2023   Chronic back pain 05/07/2023   Anxiety 05/07/2023   Obesity, Class III, BMI 40-49.9 (morbid obesity) 05/07/2023   Uncontrolled type 2 diabetes mellitus with hyperglycemia, with long-term current use of insulin  (HCC) 05/07/2023   Genetic testing 04/09/2022   Chronic venous insufficiency 06/29/2020   Lymphedema 06/29/2020   PAD (peripheral artery disease) (HCC) 05/30/2020   Ankle ulcer, right, with fat layer exposed (HCC) 05/30/2020   Allergy 05/29/2020   Asthma without status asthmaticus 05/29/2020   Diabetes mellitus type 2, uncomplicated (HCC) 05/29/2020   S/P coronary artery stent placement 04/30/2019   CAD S/P percutaneous coronary angioplasty 04/20/2019   Abnormal ECG 03/18/2019   Chest pain with high risk for cardiac etiology 03/18/2019   SOB (shortness of breath) on exertion 03/18/2019   Hypercholesterolemia 05/07/2015   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   Hypogonadism in male 11/22/2014   BPH with obstruction/lower urinary tract symptoms 11/22/2014   DDD (degenerative disc disease), lumbar 10/13/2014   Status post  lumbar laminectomy 10/13/2014   DJD of shoulder 10/13/2014   Bilateral occipital neuralgia 10/13/2014   Lumbar radiculopathy 10/13/2014   DDD (degenerative disc disease), cervical 10/13/2014   Depression 08/25/2013   HTN (hypertension) 08/25/2013   History of hemorrhoids 08/25/2013   Low back pain 08/25/2013    PCP: Dr. Lynwood Null   REFERRING PROVIDER: Orvin Daring, NP  REFERRING DIAG: [phone] (ICD-10-CM) - Hx of BKA, right (HCC)   THERAPY DIAG:  Abnormality of gait and mobility  Difficulty in walking, not elsewhere classified  Muscle weakness (generalized)  Other abnormalities of gait and mobility  Other lack of coordination  Rationale for Evaluation and Treatment: Rehabilitation  ONSET DATE: 05/15/2023  SUBJECTIVE:   SUBJECTIVE STATEMENT:  Pt states he was able to walk 2x on // bars in [facility] when getting his prosthetic assessed.  Pt states the more he wears his prosthetic the more his nerves come on, but also the more comfortable his prosthetic is. Pt states he doesn't have full knee extension ROM, ever since he had to wear the shrinker in order to allow his leg to fit in his prosthetic. States it is like the shrinker caused his leg to draw up into flexion. States he is concerned that his R knee will buckle while standing/walking due to lack of full extension and lack of muscle strength. 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   Pt states he has better balance when wearing his prosthetic. Pt states he has been doing well driving while wearing his prosthetic.  Reports wearing prosthetic 3-4hr/day. Pt reports he is doing HEP daily.    PERTINENT HISTORY:  Patient is a 58 year old male with R BKA on 05/15/2023 and revision of R BKA on 05/16/2023. PMH: with medical history HTN, HLD, CAD s/p stent, DM with neuropathy,  chronic back pain on opiates,, anxiety. Patient reports having BKA in December 2024 and just received his Prosthesis last week through [facility]. He states has been told to wear for 1-2 hours/per day for now and states doing okay- feels like the prosthesis is heavy but trying to get use to it. Reports having increased difficulty straightening knee as well. Reports here to learn how to walk well with his prosthesis. States has not really been doing any home exercises and no formal training in exercises.   PAIN:  Are you having pain? None at present- still has some upon putting socket on, but improved with time.   PRECAUTIONS: Fall  WEIGHT BEARING RESTRICTIONS: No  FALLS:  Has patient fallen in last 6 months? No  LIVING ENVIRONMENT: Lives with: lives with their spouse Lives in: House/apartment Stairs:  has aluminum ramp in front Has following equipment at home: Vannie - 2 wheeled, Environmental consultant - 4 wheeled, Wheelchair (manual), Tour manager, and Ramped entry  OCCUPATION: on Disability  PLOF: Independent  PATIENT GOALS: See if I can stand on my leg without buckling and improve my pain and walk.   NEXT MD VISIT: 01/28/2024- Prosthetic check up  OBJECTIVE:                                                                                                                                TREATMENT DATE: 01/15/24   Patient arrives in his personal manual wheelchair. Patient reports he now has 5 wheelchairs that he uses for different purposes. States he is able to independently get in/out of his car and load/unload his wheelchair from his vehicle.  Pt wearing pants today that do not allow access to don/doff prosthetic during session. Therapist provided extensive education to patient on ensuring he performs skin assessment when at home immediately after session. Patient vocalizes excellent understanding of ensuring he performs skin assessment.  Patient reports no concerns with performing w/c transfers.  States his goal is to increase R knee extension ROM, strength, and progress to walking independently.  Therapy session focused on R LE standing WBing tolerance, R glute/quad activation to improved R knee extension ROM, and gait training in // bars.   Standing R LE knee extension AAROM in // bars with B UE support - therapist providing gentle manual facilitation (equivalent of grade 1 mobs) into knee extension 3x  10-30 second holds + 3x 10sec holds at end of session. Patient states he needs therapist to assist him with improving his knee extension ROM because at home he is doing everything he can to prop it up into extension.  Therapist provided education on patient avoiding prolonged positioning in knee flexion. Patient reports pain increases to 9-10/10 when doing this, but calms down within 2 min during seated rest break.  Standing R LE WBing tolerance in // bars x5 reps with 5 second hold - performing wt shifting onto R LE with therapist providing verbal and tactile cuing for R glute and quad activation while shifting wt onto R LE - cuing for breathing/relaxation strategies for pain management.   Gait training in // bars x2 full length of bars with therapist providing min guarding of R knee during stance - cuing for increased R glute and quad activation prior to R LE WBing acceptance - +2 w/c follow. Pt reports pain 5-6/10 during R LE wt bearing portion of stance phase.   At end of session, after performing 2nd set of R knee extension stretch in standing, pt reports a Shooting pain when returning to sit in w/c.   Therapist educated pt on wearing pants that allow donning/doffing of prosthetic in future therapy sessions.  PATIENT EDUCATION:  Education details: Purpose of PT; Proposed Plan of care; Education on new prosthesis and how to reduce risk of infection/wound. Ed in [employer] Person educated: Patient Education method: Programmer, multimedia, Facilities manager, Actor cues, Verbal cues, and  Handouts Education comprehension: verbalized understanding, returned demonstration, verbal cues required, tactile cues required, and needs further education  HOME EXERCISE PROGRAM:  Access Code: [email or web address] URL: [email or web address] Date: 01/12/2024 Prepared by: Peggye Linear  Exercises - Supine Quad Set (BKA)  - 1 x daily - 3 sets - 10 reps - 5 hold - Sidelying Hip Abduction wtih Flexion and Extension (BKA)  - 1 x daily - 3 sets - 10 reps - Sidelying Hip Abduction (BKA)  - 1 x daily - 3 sets - 10 reps - Sidelying Isometric Hip Adduction (BKA)  - 1 x daily - 3 sets - 10 reps - Standing Weight Shift  - 1 x daily - 3 sets - 10 reps - Standing forward/backward weight shift with support   - 1 x daily - 3 sets - 10 reps - Seated Long Arc Quad  - 1 x daily - 3 sets - 10 reps  ASSESSMENT:  CLINICAL IMPRESSION:   Patient wearing clothing today that does not allow donning/doffing of prosthetic; however, pt verbalizes understanding of need to perform thorough skin assessment once he returns home. Patient continues to report his primary concern is lack of R knee extension ROM and limited quad strength during WBing tasks. Therapy session focused on standing R LE knee extension AAROM, standing R LE WBing tolerance, and gait training in // bars. Patient reports R knee pain increases to 9/10 during standing knee extension AAROM, but resolves during seated rest break. He will benefit from continued skilled PT to help with prosthetic training and overall RLE strengthening, balance and overall endurance training to improve his functional independence and decrease his risk of re-hospitalization or falls.     OBJECTIVE IMPAIRMENTS: Abnormal gait, cardiopulmonary status limiting activity, decreased activity tolerance, decreased balance, decreased coordination, decreased endurance, decreased knowledge of use of DME, decreased mobility, difficulty walking, decreased ROM, decreased strength, prosthetic  dependency , and pain.   ACTIVITY LIMITATIONS: carrying, lifting, bending, sitting, standing, squatting, sleeping, stairs, transfers, and bed  mobility  PARTICIPATION LIMITATIONS: meal prep, cleaning, laundry, driving, shopping, community activity, and yard work  PERSONAL FACTORS: 1-2 comorbidities: HTN, DM are also affecting patient's functional outcome.   REHAB POTENTIAL: Good  CLINICAL DECISION MAKING: Stable/uncomplicated  EVALUATION COMPLEXITY: Low  GOALS: Goals reviewed with patient? Yes  SHORT TERM GOALS: Target date: 02/20/2024 Pt will be independent with HEP in order to improve strength and balance in order to decrease fall risk and improve function at home and work.  Baseline:EVAL - No formal HEP in place Goal status: INITIAL  LONG TERM GOALS: Target date: 04/01/2024  Patient will present with no signs or symptoms of infection or wound with R residual limb Baseline: EVAL- no sign of symptom of any wound infection Goal status: INITIAL  2.  Patient will perform static stand with 50/50 weight bearing without UE Support consistently without assist while performing static standing without report of increased pain.  Baseline: EVAL- Patient with difficulty weight bearing toward affected right side.  Goal status: INITIAL  3.  Patient will ambulated > 500 feet with least restrictive AD on all surfaces with modified independence  for improved normalized gait pattern with less chance of fatigue/LBP.  Baseline: EVAL- 6 steps in // bars with step to gait Goal status: INITIAL  4.  Patient will be to perform all household ADLs and chores with R prosthesis donned at Modified independent level for return to independent function in home.  Baseline: Limited - having to modify how he performs ADL's and housework due to not standing Goal status: INITIAL  PLAN:  PT FREQUENCY: 1-2x/week  PT DURATION: 12 weeks  PLANNED INTERVENTIONS: 97164- PT Re-evaluation, 97750- Physical Performance  Testing, 97110-Therapeutic exercises, 97530- Therapeutic activity, 97112- Neuromuscular re-education, 97535- Self Care, [id number]- Manual therapy, [id number]- Gait training, [phone]- Prosthetic Initial , [phone]- Orthotic/Prosthetic subsequent, [phone]- Canalith repositioning, [id number]- Electrical stimulation (manual), [phone] (1-2 muscles), [id number] (3+ muscles)- Dry Needling, Patient/Family education, Balance training, Stair training, Taping, Joint mobilization, Joint manipulation, Spinal manipulation, Spinal mobilization, Scar mobilization, Compression bandaging, Vestibular training, DME instructions, Wheelchair mobility training, Cryotherapy, and Moist heat  PLAN FOR NEXT SESSION:  Continue Rt knee extension ROM Weight shifting & stance load tolerance Gait training as appropriate Skin assessment & prosthetic checking  Walsie Smeltz, PT, DPT, NCS, CSRS Physical Therapist - [place]  [facility]  12:48 PM 01/15/24

## 2024-01-19 ENCOUNTER — Inpatient Hospital Stay (HOSPITAL_COMMUNITY)
Admission: EM | Admit: 2024-01-19 | Discharge: 2024-01-21 | DRG: 175 | Disposition: A | Discharge status: Home / Self Care | Attending: Internal Medicine | Admitting: Internal Medicine

## 2024-01-19 ENCOUNTER — Other Ambulatory Visit: Payer: Self-pay

## 2024-01-19 ENCOUNTER — Inpatient Hospital Stay (HOSPITAL_COMMUNITY)

## 2024-01-19 ENCOUNTER — Other Ambulatory Visit (HOSPITAL_COMMUNITY): Payer: Self-pay | Admitting: *Deleted

## 2024-01-19 ENCOUNTER — Encounter (HOSPITAL_COMMUNITY): Payer: Self-pay | Admitting: Emergency Medicine

## 2024-01-19 ENCOUNTER — Emergency Department (HOSPITAL_COMMUNITY)

## 2024-01-19 DIAGNOSIS — D72829 Elevated white blood cell count, unspecified: Secondary | ICD-10-CM | POA: Diagnosis present

## 2024-01-19 DIAGNOSIS — I10 Essential (primary) hypertension: Secondary | ICD-10-CM | POA: Diagnosis not present

## 2024-01-19 DIAGNOSIS — I2692 Saddle embolus of pulmonary artery without acute cor pulmonale: Secondary | ICD-10-CM | POA: Diagnosis not present

## 2024-01-19 DIAGNOSIS — Z888 Allergy status to other drugs, medicaments and biological substances status: Secondary | ICD-10-CM

## 2024-01-19 DIAGNOSIS — G8929 Other chronic pain: Secondary | ICD-10-CM | POA: Diagnosis not present

## 2024-01-19 DIAGNOSIS — J9601 Acute respiratory failure with hypoxia: Secondary | ICD-10-CM | POA: Diagnosis present

## 2024-01-19 DIAGNOSIS — E876 Hypokalemia: Secondary | ICD-10-CM | POA: Diagnosis present

## 2024-01-19 DIAGNOSIS — E872 Acidosis, unspecified: Secondary | ICD-10-CM | POA: Diagnosis present

## 2024-01-19 DIAGNOSIS — K219 Gastro-esophageal reflux disease without esophagitis: Secondary | ICD-10-CM | POA: Diagnosis present

## 2024-01-19 DIAGNOSIS — I82431 Acute embolism and thrombosis of right popliteal vein: Secondary | ICD-10-CM | POA: Diagnosis present

## 2024-01-19 DIAGNOSIS — I2699 Other pulmonary embolism without acute cor pulmonale: Secondary | ICD-10-CM | POA: Diagnosis not present

## 2024-01-19 DIAGNOSIS — J45909 Unspecified asthma, uncomplicated: Secondary | ICD-10-CM | POA: Diagnosis not present

## 2024-01-19 DIAGNOSIS — Z89511 Acquired absence of right leg below knee: Secondary | ICD-10-CM

## 2024-01-19 DIAGNOSIS — I82811 Embolism and thrombosis of superficial veins of right lower extremities: Secondary | ICD-10-CM | POA: Diagnosis not present

## 2024-01-19 DIAGNOSIS — Z7984 Long term (current) use of oral hypoglycemic drugs: Secondary | ICD-10-CM | POA: Diagnosis not present

## 2024-01-19 DIAGNOSIS — Z8249 Family history of ischemic heart disease and other diseases of the circulatory system: Secondary | ICD-10-CM

## 2024-01-19 DIAGNOSIS — Z794 Long term (current) use of insulin: Secondary | ICD-10-CM | POA: Diagnosis not present

## 2024-01-19 DIAGNOSIS — Z83438 Family history of other disorder of lipoprotein metabolism and other lipidemia: Secondary | ICD-10-CM

## 2024-01-19 DIAGNOSIS — Z886 Allergy status to analgesic agent status: Secondary | ICD-10-CM

## 2024-01-19 DIAGNOSIS — I824Y1 Acute embolism and thrombosis of unspecified deep veins of right proximal lower extremity: Secondary | ICD-10-CM | POA: Diagnosis not present

## 2024-01-19 DIAGNOSIS — I2510 Atherosclerotic heart disease of native coronary artery without angina pectoris: Secondary | ICD-10-CM | POA: Diagnosis present

## 2024-01-19 DIAGNOSIS — Z95820 Peripheral vascular angioplasty status with implants and grafts: Secondary | ICD-10-CM | POA: Diagnosis not present

## 2024-01-19 DIAGNOSIS — Z801 Family history of malignant neoplasm of trachea, bronchus and lung: Secondary | ICD-10-CM

## 2024-01-19 DIAGNOSIS — Z820 Family history of epilepsy and other diseases of the nervous system: Secondary | ICD-10-CM

## 2024-01-19 DIAGNOSIS — E7800 Pure hypercholesterolemia, unspecified: Secondary | ICD-10-CM | POA: Diagnosis not present

## 2024-01-19 DIAGNOSIS — Z79891 Long term (current) use of opiate analgesic: Secondary | ICD-10-CM

## 2024-01-19 DIAGNOSIS — R0600 Dyspnea, unspecified: Secondary | ICD-10-CM | POA: Diagnosis not present

## 2024-01-19 DIAGNOSIS — E1151 Type 2 diabetes mellitus with diabetic peripheral angiopathy without gangrene: Secondary | ICD-10-CM | POA: Diagnosis not present

## 2024-01-19 DIAGNOSIS — Z7902 Long term (current) use of antithrombotics/antiplatelets: Secondary | ICD-10-CM

## 2024-01-19 DIAGNOSIS — Z86711 Personal history of pulmonary embolism: Secondary | ICD-10-CM

## 2024-01-19 DIAGNOSIS — Z833 Family history of diabetes mellitus: Secondary | ICD-10-CM

## 2024-01-19 DIAGNOSIS — I82411 Acute embolism and thrombosis of right femoral vein: Secondary | ICD-10-CM | POA: Diagnosis not present

## 2024-01-19 DIAGNOSIS — Z7982 Long term (current) use of aspirin: Secondary | ICD-10-CM

## 2024-01-19 DIAGNOSIS — E1165 Type 2 diabetes mellitus with hyperglycemia: Secondary | ICD-10-CM | POA: Diagnosis not present

## 2024-01-19 DIAGNOSIS — R079 Chest pain, unspecified: Secondary | ICD-10-CM | POA: Diagnosis not present

## 2024-01-19 DIAGNOSIS — I2602 Saddle embolus of pulmonary artery with acute cor pulmonale: Secondary | ICD-10-CM

## 2024-01-19 DIAGNOSIS — Z955 Presence of coronary angioplasty implant and graft: Secondary | ICD-10-CM

## 2024-01-19 DIAGNOSIS — Z8042 Family history of malignant neoplasm of prostate: Secondary | ICD-10-CM

## 2024-01-19 DIAGNOSIS — N400 Enlarged prostate without lower urinary tract symptoms: Secondary | ICD-10-CM | POA: Diagnosis present

## 2024-01-19 DIAGNOSIS — Z800 Family history of malignant neoplasm of digestive organs: Secondary | ICD-10-CM

## 2024-01-19 LAB — BASIC METABOLIC PANEL WITH GFR
Anion gap: 16 — ABNORMAL HIGH (ref 5–15)
BUN: 9 mg/dL (ref 6–20)
CO2: 22 mmol/L (ref 22–32)
Calcium: 8.9 mg/dL (ref 8.9–10.3)
Chloride: 97 mmol/L — ABNORMAL LOW (ref 98–111)
Creatinine, Ser: 0.81 mg/dL (ref 0.61–1.24)
GFR, Estimated: >60 mL/min (ref >60)
Glucose, Bld: 316 mg/dL — ABNORMAL HIGH (ref 70–99)
Potassium: 3.2 mmol/L — ABNORMAL LOW (ref 3.5–5.1)
Sodium: 135 mmol/L (ref 135–145)

## 2024-01-19 LAB — GLUCOSE, CAPILLARY
Glucose-Capillary: 228 mg/dL — ABNORMAL HIGH (ref 70–99)
Glucose-Capillary: 225 mg/dL — ABNORMAL HIGH (ref 70–99)
Glucose-Capillary: 242 mg/dL — ABNORMAL HIGH (ref 70–99)
Glucose-Capillary: 181 mg/dL — ABNORMAL HIGH (ref 70–99)

## 2024-01-19 LAB — CBC
HCT: 41.3 % (ref 39.0–52.0)
Hemoglobin: 14.3 g/dL (ref 13.0–17.0)
MCH: 28.7 pg (ref 26.0–34.0)
MCHC: 34.6 g/dL (ref 30.0–36.0)
MCV: 82.9 fL (ref 80.0–100.0)
Platelets: 258 K/uL (ref 150–400)
RBC: 4.98 MIL/uL (ref 4.22–5.81)
RDW: 13.2 % (ref 11.5–15.5)
WBC: 10.6 K/uL — ABNORMAL HIGH (ref 4.0–10.5)
nRBC: 0 % (ref 0.0–0.2)

## 2024-01-19 LAB — HEPARIN LEVEL (UNFRACTIONATED)
Heparin Unfractionated: 0.25 [IU]/mL — ABNORMAL LOW (ref 0.30–0.70)
Heparin Unfractionated: 0.41 [IU]/mL (ref 0.30–0.70)
Heparin Unfractionated: 0.43 [IU]/mL (ref 0.30–0.70)

## 2024-01-19 LAB — TROPONIN I (HIGH SENSITIVITY)
Troponin I (High Sensitivity): 55 ng/L — ABNORMAL HIGH (ref <18)
Troponin I (High Sensitivity): 66 ng/L — ABNORMAL HIGH (ref <18)

## 2024-01-19 LAB — MRSA NEXT GEN BY PCR, NASAL: MRSA by PCR Next Gen: NOT DETECTED

## 2024-01-19 LAB — ECHOCARDIOGRAM COMPLETE
Height: 69 in
S' Lateral: 3.2 cm
Weight: 3880.1 [oz_av]

## 2024-01-19 LAB — APTT: aPTT: 26 s (ref 24–36)

## 2024-01-19 LAB — PROTIME-INR
INR: 1 (ref 0.8–1.2)
Prothrombin Time: 14.2 s (ref 11.4–15.2)

## 2024-01-19 MED ORDER — INSULIN ASPART 100 UNIT/ML IJ SOLN: 0.0000 [IU] | Freq: Every day | INTRAMUSCULAR | Status: DC

## 2024-01-19 MED ORDER — INSULIN ASPART 100 UNIT/ML IJ SOLN
0.0000 [IU] | Freq: Three times a day (TID) | INTRAMUSCULAR | Status: DC
  Administered 2024-01-19 (×2): 7 [IU] via SUBCUTANEOUS

## 2024-01-19 MED ORDER — HEPARIN (PORCINE) 25000 UT/250ML-% IV SOLN
2000.0000 [IU]/h | INTRAVENOUS | Status: DC
  Administered 2024-01-19: 1800 [IU]/h via INTRAVENOUS
  Administered 2024-01-19 – 2024-01-21 (×4): 2000 [IU]/h via INTRAVENOUS
  Filled 2024-01-19 (×5): qty 250

## 2024-01-19 MED ORDER — HYDRALAZINE HCL 20 MG/ML IJ SOLN
10.0000 mg | Freq: Four times a day (QID) | INTRAMUSCULAR | Status: DC | PRN
  Administered 2024-01-19: 10 mg via INTRAVENOUS
  Filled 2024-01-19: qty 1

## 2024-01-19 MED ORDER — ACETAMINOPHEN 325 MG PO TABS
650.0000 mg | ORAL_TABLET | Freq: Once | ORAL | Status: AC
  Administered 2024-01-19: 650 mg via ORAL
  Filled 2024-01-19: qty 2

## 2024-01-19 MED ORDER — POTASSIUM CHLORIDE CRYS ER 20 MEQ PO TBCR
40.0000 meq | EXTENDED_RELEASE_TABLET | Freq: Once | ORAL | Status: AC
  Administered 2024-01-19: 40 meq via ORAL
  Filled 2024-01-19: qty 2

## 2024-01-19 MED ORDER — FENTANYL CITRATE (PF) 100 MCG/2ML IJ SOLN
50.0000 ug | Freq: Once | INTRAMUSCULAR | Status: AC
  Administered 2024-01-19: 50 ug via INTRAVENOUS
  Filled 2024-01-19: qty 2

## 2024-01-19 MED ORDER — INSULIN ASPART 100 UNIT/ML IJ SOLN
0.0000 [IU] | Freq: Three times a day (TID) | INTRAMUSCULAR | Status: DC
  Administered 2024-01-19: 7 [IU] via SUBCUTANEOUS
  Administered 2024-01-20: 4 [IU] via SUBCUTANEOUS
  Administered 2024-01-20: 7 [IU] via SUBCUTANEOUS
  Administered 2024-01-20 – 2024-01-21 (×2): 4 [IU] via SUBCUTANEOUS

## 2024-01-19 MED ORDER — INSULIN ASPART 100 UNIT/ML IJ SOLN
6.0000 [IU] | Freq: Three times a day (TID) | INTRAMUSCULAR | Status: DC
  Administered 2024-01-19 (×2): 6 [IU] via SUBCUTANEOUS

## 2024-01-19 MED ORDER — ACETAMINOPHEN 650 MG RE SUPP: 650.0000 mg | Freq: Four times a day (QID) | RECTAL | Status: DC | PRN

## 2024-01-19 MED ORDER — DEXTROMETHORPHAN POLISTIREX ER 30 MG/5ML PO SUER
30.0000 mg | Freq: Two times a day (BID) | ORAL | Status: DC | PRN
  Administered 2024-01-19: 30 mg via ORAL
  Filled 2024-01-19 (×3): qty 5

## 2024-01-19 MED ORDER — MONTELUKAST SODIUM 10 MG PO TABS
10.0000 mg | ORAL_TABLET | Freq: Every day | ORAL | Status: DC
  Administered 2024-01-19 – 2024-01-20 (×2): 10 mg via ORAL
  Filled 2024-01-19 (×2): qty 1

## 2024-01-19 MED ORDER — INSULIN ASPART 100 UNIT/ML IJ SOLN
10.0000 [IU] | Freq: Three times a day (TID) | INTRAMUSCULAR | Status: DC
  Administered 2024-01-19 – 2024-01-21 (×5): 10 [IU] via SUBCUTANEOUS

## 2024-01-19 MED ORDER — FLUTICASONE PROPIONATE 50 MCG/ACT NA SUSP
1.0000 | Freq: Two times a day (BID) | NASAL | Status: DC | PRN
  Administered 2024-01-20: 1 via NASAL
  Filled 2024-01-19: qty 16

## 2024-01-19 MED ORDER — ACETAMINOPHEN 325 MG PO TABS: 650.0000 mg | ORAL_TABLET | Freq: Four times a day (QID) | ORAL | Status: DC | PRN

## 2024-01-19 MED ORDER — MORPHINE SULFATE (PF) 2 MG/ML IV SOLN
2.0000 mg | INTRAVENOUS | Status: DC | PRN
  Administered 2024-01-19: 2 mg via INTRAVENOUS
  Filled 2024-01-19: qty 1

## 2024-01-19 MED ORDER — HEPARIN BOLUS VIA INFUSION
5000.0000 [IU] | Freq: Once | INTRAVENOUS | Status: AC
  Administered 2024-01-19: 5000 [IU] via INTRAVENOUS

## 2024-01-19 MED ORDER — ATORVASTATIN CALCIUM 40 MG PO TABS
80.0000 mg | ORAL_TABLET | Freq: Every day | ORAL | Status: DC
  Administered 2024-01-20: 80 mg via ORAL
  Filled 2024-01-19: qty 2

## 2024-01-19 MED ORDER — ISOSORBIDE MONONITRATE ER 30 MG PO TB24
30.0000 mg | ORAL_TABLET | Freq: Every day | ORAL | Status: DC
  Administered 2024-01-19 – 2024-01-21 (×3): 30 mg via ORAL
  Filled 2024-01-19 (×3): qty 1

## 2024-01-19 MED ORDER — LORATADINE 10 MG PO TABS
10.0000 mg | ORAL_TABLET | Freq: Every day | ORAL | Status: DC
  Administered 2024-01-19 – 2024-01-21 (×3): 10 mg via ORAL
  Filled 2024-01-19 (×3): qty 1

## 2024-01-19 MED ORDER — INSULIN GLARGINE-YFGN 100 UNIT/ML ~~LOC~~ SOLN
30.0000 [IU] | Freq: Every day | SUBCUTANEOUS | Status: DC
  Administered 2024-01-19 – 2024-01-21 (×3): 30 [IU] via SUBCUTANEOUS
  Filled 2024-01-19 (×4): qty 0.3

## 2024-01-19 MED ORDER — HYDROCODONE-ACETAMINOPHEN 5-325 MG PO TABS
1.0000 | ORAL_TABLET | ORAL | Status: DC | PRN
  Administered 2024-01-19: 2 via ORAL
  Administered 2024-01-19: 1 via ORAL
  Administered 2024-01-20 (×3): 2 via ORAL
  Filled 2024-01-19: qty 1
  Filled 2024-01-19 (×4): qty 2

## 2024-01-19 MED ORDER — METOPROLOL SUCCINATE ER 50 MG PO TB24
50.0000 mg | ORAL_TABLET | Freq: Every day | ORAL | Status: DC
  Administered 2024-01-19 – 2024-01-21 (×3): 50 mg via ORAL
  Filled 2024-01-19 (×3): qty 1

## 2024-01-19 MED ORDER — PRASUGREL HCL 10 MG PO TABS
10.0000 mg | ORAL_TABLET | Freq: Every day | ORAL | Status: DC
  Administered 2024-01-20: 10 mg via ORAL
  Filled 2024-01-19 (×3): qty 1

## 2024-01-19 MED ORDER — SENNA 8.6 MG PO TABS
1.0000 | ORAL_TABLET | Freq: Two times a day (BID) | ORAL | Status: DC
  Administered 2024-01-19 – 2024-01-21 (×5): 8.6 mg via ORAL
  Filled 2024-01-19 (×5): qty 1

## 2024-01-19 MED ORDER — DOCUSATE SODIUM 100 MG PO CAPS
100.0000 mg | ORAL_CAPSULE | Freq: Two times a day (BID) | ORAL | Status: DC
  Administered 2024-01-19 – 2024-01-21 (×5): 100 mg via ORAL
  Filled 2024-01-19 (×5): qty 1

## 2024-01-19 MED ORDER — MORPHINE SULFATE (PF) 2 MG/ML IV SOLN
1.0000 mg | INTRAVENOUS | Status: DC | PRN
  Administered 2024-01-19 (×2): 1 mg via INTRAVENOUS
  Filled 2024-01-19 (×2): qty 1

## 2024-01-19 MED ORDER — AMLODIPINE BESYLATE 5 MG PO TABS
5.0000 mg | ORAL_TABLET | Freq: Every day | ORAL | Status: DC
  Administered 2024-01-19 – 2024-01-20 (×2): 5 mg via ORAL
  Filled 2024-01-19 (×2): qty 1

## 2024-01-19 MED ORDER — ONDANSETRON HCL 4 MG PO TABS: 4.0000 mg | ORAL_TABLET | Freq: Four times a day (QID) | ORAL | Status: DC | PRN

## 2024-01-19 MED ORDER — ONDANSETRON HCL 4 MG/2ML IJ SOLN: 4.0000 mg | Freq: Four times a day (QID) | INTRAMUSCULAR | Status: DC | PRN

## 2024-01-19 MED ORDER — HEPARIN BOLUS VIA INFUSION
1200.0000 [IU] | Freq: Once | INTRAVENOUS | Status: AC
  Administered 2024-01-19: 1200 [IU] via INTRAVENOUS
  Filled 2024-01-19: qty 1200

## 2024-01-19 MED ORDER — CHLORHEXIDINE GLUCONATE CLOTH 2 % EX PADS
6.0000 | MEDICATED_PAD | Freq: Every day | CUTANEOUS | Status: DC
  Administered 2024-01-19 – 2024-01-21 (×3): 6 via TOPICAL

## 2024-01-19 MED ORDER — IOHEXOL 350 MG/ML SOLN
75.0000 mL | Freq: Once | INTRAVENOUS | Status: AC | PRN
  Administered 2024-01-19: 75 mL via INTRAVENOUS

## 2024-01-19 NOTE — TOC CM/SW Note (Signed)
 Transition of Care [facility]) - Inpatient Brief Assessment   Patient Details  Name: Gerald Ellis MRN: [id number] Date of Birth: 06/08/1965  Transition of Care [facility]) CM/SW Contact:    Lucie Lunger, LCSWA Phone Number: 01/19/2024, 12:02 PM   Clinical Narrative: Transition of Care Department [facility]) has reviewed patient and no TOC needs have been identified at this time. We will continue to monitor patient advancement through interdiciplinary progression rounds. If new patient transition needs arise, please place a TOC consult.  Transition of Care Asessment: Insurance and Status: Insurance coverage has been reviewed Patient has primary care physician: Yes Home environment has been reviewed: From home Prior level of function:: Independent Prior/Current Home Services: No current home services Social Drivers of Health Review: SDOH reviewed no interventions necessary Readmission risk has been reviewed: Yes Transition of care needs: no transition of care needs at this time

## 2024-01-19 NOTE — ED Provider Notes (Signed)
 [place] EMERGENCY DEPARTMENT AT [facility] Provider Note   CSN: [id number] Arrival date & time: 01/19/24  0113     Patient presents with: Back Pain   Gerald Ellis is a 58 y.o. male.   The history is provided by the patient.   Patient with extensive history including hypertension, diabetes, peripheral artery disease with right BKA presents with multiple issues. Patient reports over 2 days ago he was riding a lawnmower when he hit a bump and had immediate pain in his lower back.  No falls or trauma.  The pain soon dissipated.  Since that time he has had intermittent episodes of pain in the same region that is very severe.  He also reports brief episodes of left leg numbness, but no weakness.  No fecal or urinary incontinence.  He reports distant history of lumbar surgery.  No abdominal pain.  Earlier tonight he was helping someone fish at his pond when he started having shortness of breath and chest tightness.  He is a never smoker.  He is not on home oxygen .  He took albuterol  without relief  No recent fevers or illnesses are reported Past Medical History:  Diagnosis Date   Benign enlargement of prostate    Bronchitis    Childhood asthma    Diabetes mellitus without complication (HCC)    Environmental allergies    Erectile dysfunction    Headache    Hemorrhoids    Hypercholesteremia    Hypertension    Hypogonadism in male    Lumbago    Over weight     Prior to Admission medications   Medication Sig Start Date End Date Taking? Authorizing Provider  amLODipine  (NORVASC ) 5 MG tablet Take 1 tablet (5 mg total) by mouth daily. 04/21/19   Clarisa Kung, PA-C  aspirin  81 MG chewable tablet Chew 1 tablet (81 mg total) by mouth daily. 04/21/19   Clarisa Kung, PA-C  atorvastatin  (LIPITOR ) 80 MG tablet Take 1 tablet (80 mg total) by mouth daily at 6 PM. 04/21/19   Clarisa Kung, PA-C  busPIRone  (BUSPAR ) 7.5 MG tablet Take 7.5 mg by mouth 2 (two) times daily.    [provider]  cetirizine (ZYRTEC) 10 MG tablet Take 10 mg by mouth daily.    [provider]  Continuous Glucose Sensor (FREESTYLE LIBRE 2 SENSOR) MISC USE 1 KIT EVERY 14 (FOURTEEN) DAYS FOR GLUCOSE MONITORING 05/12/23   [provider]  diclofenac Sodium (VOLTAREN) 1 % GEL Apply 2 g topically 4 (four) times daily.    [provider]  feeding supplement (ENSURE ENLIVE / ENSURE PLUS) LIQD Take 237 mLs by mouth 2 (two) times daily between meals. 05/26/23   Amin, Sumayya, MD  fluticasone  (FLONASE ) 50 MCG/ACT nasal spray Place 1 spray into both nostrils 2 (two) times daily as needed (nasal congestion).    [provider]  Fluticasone -Umeclidin-Vilant 200-62.5-25 MCG/INH AEPB Inhale into the lungs. 04/24/20   [provider]  gabapentin  (NEURONTIN ) 300 MG capsule Take 1 capsule (300 mg total) by mouth daily AND 2 capsules (600 mg total) at bedtime. 300 mg in the morning and 600 mg at bedtime. 05/26/23   Caleen Qualia, MD  HUMALOG KWIKPEN 200 UNIT/ML KwikPen Inject 22 units three times daily before meals. 06/12/23   [provider]  HYDROcodone -acetaminophen  (NORCO/VICODIN) 5-325 MG tablet Take 1-2 tablets by mouth every 4 (four) hours as needed for severe pain (pain score 7-10) or moderate pain (pain score 4-6). 05/26/23  Caleen Qualia, MD  insulin  aspart (NOVOLOG ) 100 UNIT/ML injection Inject 3 Units into the skin 3 (three) times daily with meals. 05/26/23   Amin, Sumayya, MD  insulin  aspart (NOVOLOG ) 100 UNIT/ML injection Inject 0-20 Units into the skin every 4 (four) hours. 05/26/23   Amin, Sumayya, MD  insulin  degludec (TRESIBA  FLEXTOUCH) 200 UNIT/ML FlexTouch Pen Inject 70 units nightly 06/12/23   [provider]  insulin  glargine-yfgn (SEMGLEE ) 100 UNIT/ML injection Inject 0.4 mLs (40 Units total) into the skin at bedtime. 05/26/23   Amin, Sumayya, MD  isosorbide  mononitrate (IMDUR ) 30 MG 24 hr tablet Take 30 mg by mouth daily.    [provider]  losartan  (COZAAR ) 50 MG tablet Take 1 tablet (50 mg total) by mouth daily. 05/26/23   Amin, Sumayya, MD  magnesium  hydroxide (MILK OF MAGNESIA) 400 MG/5ML suspension Take 30 mLs by mouth at bedtime as needed for mild constipation or moderate constipation. 05/26/23   Amin, Sumayya, MD  metFORMIN (GLUCOPHAGE) 1000 MG tablet Take 1,000 mg by mouth 2 (two) times daily.     [provider]  metoCLOPramide  (REGLAN ) 5 MG tablet Take 1 tablet (5 mg total) by mouth every 8 (eight) hours as needed for nausea or vomiting. 05/26/23   Amin, Sumayya, MD  metoprolol  succinate (TOPROL -XL) 50 MG 24 hr tablet Take 50 mg by mouth daily. Take with or immediately following a meal.    [provider]  montelukast  (SINGULAIR ) 10 MG tablet Take by mouth. 12/15/19 12/02/23  [provider]  Multiple Vitamins-Minerals (MULTIVITAMIN WITH MINERALS) tablet Take 1 tablet by mouth 2 (two) times daily.     [provider]  mupirocin  ointment (BACTROBAN ) 2 % SMARTSIG:1 Application Topical 2-3 Times Daily 04/06/20   [provider]  naloxone (NARCAN) nasal spray 4 mg/0.1 mL Place into the nose. 06/05/23   [provider]  Omega-3 Fatty Acids (FISH OIL) 500 MG CAPS Take 500 mg by mouth 2 (two) times daily.    [provider]  ondansetron  (ZOFRAN -ODT) 4 MG disintegrating tablet Take 1 tablet (4 mg total) by mouth every 8 (eight) hours as needed for nausea or vomiting. 04/25/23   Lang Dover, MD  oxyCODONE -acetaminophen  (PERCOCET) 10-325 MG tablet Take 1 tablet by mouth.    [provider]  pantoprazole  (PROTONIX ) 40 MG tablet Take 1 tablet (40 mg total) by mouth daily. 04/25/23 04/24/24  Lang Dover, MD  pioglitazone  (ACTOS ) 15 MG tablet Take 15 mg by mouth 2 (two) times daily.    [provider]  polyethylene glycol (MIRALAX  / GLYCOLAX ) 17 g packet Take 17 g by mouth daily. 05/26/23   Caleen Qualia, MD  prasugrel  (EFFIENT ) 10 MG TABS tablet  Take 1 tablet (10 mg total) by mouth daily. 04/21/19   Clarisa Kung, PA-C  sucralfate (CARAFATE) 1 GM/10ML suspension Take 1 g by mouth 4 (four) times daily -  with meals and at bedtime. Patient not taking: Reported on 12/26/2023 04/30/23 04/29/24  [provider]  tadalafil (CIALIS) 20 MG tablet Take 20 mg by mouth daily as needed for erectile dysfunction.    [provider]    Allergies: Ibuprofen, Nsaids, and Fish oil    Review of Systems  Constitutional:  Negative for fever.  Respiratory:  Positive for shortness of breath.   Cardiovascular:  Positive for chest pain.  Musculoskeletal:  Positive for back pain.  Neurological:  Positive for numbness. Negative for weakness.    Updated Vital Signs BP (!) 183/110  Pulse 97   Temp 98.2 F (36.8 C) (Oral)   Resp 17   Ht 1.753 m (5' 9)   Wt 110 kg   SpO2 94%   BMI 35.81 kg/m   Physical Exam CONSTITUTIONAL: Ill-appearing HEAD: Normocephalic/atraumatic EYES: EOMI/PERRL ENMT: Mucous membranes moist NECK: supple no meningeal signs SPINE/BACK: No cervical or thoracic tenderness, lumbar tenderness noted No bruising/crepitance/stepoffs noted to spine CV: S1/S2 noted, tachycardic, no murmurs LUNGS: Tachypneic but no added lung sounds, currently wearing 3 L of oxygen  ABDOMEN: soft, nontender NEURO: Pt is awake/alert/appropriate, moves all extremitiesx4.  No facial droop.  He can move both lower extremities out difficulty.  Appropriate power noted in the left lower extremity with hip flexion, knee flexion extension and ankle dorsi and plantarflexion.  He can also range the right lower extremity and has previous BKA EXTREMITIES: pulses normal/equal, full ROM Distal pulses equal and intact in the upper extremities as well as the left foot.  Right BKA noted SKIN: warm, color normal PSYCH: Mildly anxious  (all labs ordered are listed, but only abnormal results are displayed) Labs Reviewed  BASIC METABOLIC PANEL WITH GFR -  Abnormal; Notable for the following components:      Result Value   Potassium 3.2 (*)    Chloride 97 (*)    Glucose, Bld 316 (*)    Anion gap 16 (*)    All other components within normal limits  CBC - Abnormal; Notable for the following components:   WBC 10.6 (*)    All other components within normal limits  TROPONIN I (HIGH SENSITIVITY) - Abnormal; Notable for the following components:   Troponin I (High Sensitivity) 55 (*)    All other components within normal limits  TROPONIN I (HIGH SENSITIVITY) - Abnormal; Notable for the following components:   Troponin I (High Sensitivity) 66 (*)    All other components within normal limits  PROTIME-INR  APTT  HEPARIN  LEVEL (UNFRACTIONATED)    EKG: ED ECG REPORT   Date: 01/19/2024 0135am  Rate: 111  Rhythm: sinus tachycardia  QRS Axis: normal  Intervals: normal  ST/T Wave abnormalities: nonspecific ST changes  Conduction Disutrbances:none  Narrative Interpretation:   Old EKG Reviewed: unchanged  I have personally reviewed the EKG tracing and agree with the computerized printout as noted.   Radiology: CT Angio Chest PE W and/or Wo Contrast Addendum Date: 01/19/2024 ADDENDUM REPORT: 01/19/2024 03:30 ADDENDUM: These results were called by telephone at the time of interpretation on 01/19/2024 at 3:30 am to provider Yuliet Needs , who verbally acknowledged these results. Electronically Signed   By: Dorethia Molt M.D.   On: 01/19/2024 03:30   Result Date: 01/19/2024 CLINICAL DATA:  High probability pulmonary embolism, dyspnea, back pain EXAM: CT ANGIOGRAPHY CHEST WITH CONTRAST TECHNIQUE: Multidetector CT imaging of the chest was performed using the standard protocol during bolus administration of intravenous contrast. Multiplanar CT image reconstructions and MIPs were obtained to evaluate the vascular anatomy. RADIATION DOSE REDUCTION: This exam was performed according to the departmental dose-optimization program which includes automated  exposure control, adjustment of the mA and/or kV according to patient size and/or use of iterative reconstruction technique. CONTRAST:  75mL OMNIPAQUE  IOHEXOL  350 MG/ML SOLN COMPARISON:  03/22/2021 FINDINGS: Cardiovascular: There is adequate opacification of the pulmonary arterial tree. There is a saddle embolus present with the dominant embolic burden within the right pulmonary artery but with additional filling defect seen within the left upper lobar and segmental pulmonary arteries. The embolic burden is large. The central  pulmonary arteries are dilated in keeping with changes of pulmonary arterial hypertension. Global cardiac size, however, is within normal limits there is no CT evidence of right heart strain. Extensive multi-vessel coronary artery calcification. No pericardial effusion. The thoracic aorta is unremarkable. Mediastinum/Nodes: No enlarged mediastinal, hilar, or axillary lymph nodes. Thyroid gland, trachea, and esophagus demonstrate no significant findings. Lungs/Pleura: Lungs are clear. No pleural effusion or pneumothorax. Upper Abdomen: No acute abnormality. Musculoskeletal: No acute bone abnormality. No lytic or blastic bone lesion. Osseous structures are age appropriate. Review of the MIP images confirms the above findings. IMPRESSION: 1. Saddle embolus with the dominant embolic burden within the right pulmonary artery but with additional filling defects within the left upper lobar and segmental pulmonary arteries. The embolic burden is large. No CT evidence of right heart strain. 2. Morphologic changes in keeping with pulmonary arterial hypertension. 3. Extensive multi-vessel coronary artery calcification. Electronically Signed: By: Dorethia Molt M.D. On: 01/19/2024 03:14   DG Chest Portable 1 View Result Date: 01/19/2024 CLINICAL DATA:  Dyspnea EXAM: PORTABLE CHEST 1 VIEW COMPARISON:  None Available. FINDINGS: The heart size and mediastinal contours are within normal limits. Both lungs are  clear. The visualized skeletal structures are unremarkable. IMPRESSION: No active disease. Electronically Signed   By: Dorethia Molt M.D.   On: 01/19/2024 03:03     .Critical Care  Performed by: Midge Golas, MD Authorized by: Midge Golas, MD   Critical care provider statement:    Critical care time (minutes):  80   Critical care start time:  01/19/2024 1:30 AM   Critical care end time:  01/19/2024 2:50 AM   Critical care time was exclusive of:  Separately billable procedures and treating other patients   Critical care was necessary to treat or prevent imminent or life-threatening deterioration of the following conditions:  Respiratory failure and cardiac failure   Critical care was time spent personally by me on the following activities:  Examination of patient, development of treatment plan with patient or surrogate, pulse oximetry, ordering and review of radiographic studies, ordering and review of laboratory studies, ordering and performing treatments and interventions, re-evaluation of patient's condition, review of old charts, evaluation of patient's response to treatment and discussions with consultants   I assumed direction of critical care for this patient from another provider in my specialty: no     Care discussed with: admitting provider      Medications Ordered in the ED  heparin  ADULT infusion 100 units/mL (25000 units/250mL) (1,800 Units/hr Intravenous New Bag/Given 01/19/24 0343)  fentaNYL  (SUBLIMAZE ) injection 50 mcg (50 mcg Intravenous Given 01/19/24 0213)  acetaminophen  (TYLENOL ) tablet 650 mg (650 mg Oral Given 01/19/24 0310)  iohexol  (OMNIPAQUE ) 350 MG/ML injection 75 mL (75 mLs Intravenous Contrast Given 01/19/24 0255)  heparin  bolus via infusion 5,000 Units (5,000 Units Intravenous Bolus from Bag 01/19/24 0343)    Clinical Course as of 01/19/24 0419  Mon Jan 19, 2024  0248 Patient presented for multiple complaints.  He reports initially started with back pain after  hitting a bump around the mower.  That pain was intermittent over the past several days with left leg numbness that has since resolved.  Patient also reported onset of shortness of breath over the past several hours while he was helping someone fish.  He also reports chest pain Will proceed with CT chest to rule out PE since patient is tachycardic, reporting chest pain and new oxygen  requirement, we will [DW]  0248 Patient reports currently his back  pain is improved.  He now admits that he has chronic back pain issues and is on chronic opiates.  He has no new weakness, and there is no incontinence.  Currently he still feels short of breath and has a new oxygen  requirement.  He reports some pleuritic lower chest pain. [DW]  0254 WBC(!): 10.6 Mild leukocytosis [DW]  0254 Glucose(!): 316 Hyperglycemia [DW]  0335 Patient found to have a large saddle embolus but no right heart strain discussed with radiology.  Will consult critical care [DW]  [phone] D/w dr lorren for admission  [DW]    Clinical Course User Index [DW] Midge Golas, MD                                 Medical Decision Making Amount and/or Complexity of Data Reviewed Labs: ordered. Decision-making details documented in ED Course. Radiology: ordered.  Risk OTC drugs. Prescription drug management. Decision regarding hospitalization.   This patient presents to the ED for concern of chest pain, shortness of breath, this involves an extensive number of treatment options, and is a complaint that carries with it a high risk of complications and morbidity.  The differential diagnosis includes but is not limited to acute coronary syndrome, aortic dissection, pulmonary embolism, pericarditis, pneumothorax, pneumonia, myocarditis, pleurisy, esophageal rupture   Comorbidities that complicate the patient evaluation: Patient's presentation is complicated by their history of hypertension, diabetes, PAD  Social Determinants of  Health: Patient's poor mobility  increases the complexity of managing their presentation  Additional history obtained: Records reviewed outpatient vascular records reviewed  Lab Tests: I Ordered, and personally interpreted labs.  The pertinent results include: Mild leukocytosis  Imaging Studies ordered: I ordered imaging studies including X-ray chest  I independently visualized and interpreted imaging which showed chest x-ray is negative I agree with the radiologist interpretation CT chest was reviewed personally and it shows a large PE  Cardiac Monitoring: The patient was maintained on a cardiac monitor.  I personally viewed and interpreted the cardiac monitor which showed an underlying rhythm of:  sinus tachycardia  Medicines ordered and prescription drug management: I ordered medication including heparin  for pulmonary embolism  Critical Interventions:   admission, heparinization  Consultations Obtained: I requested consultation with the admitting physician [facility], radiologist Dr. Kimberlee, and consultant Dr. Orlin Fairly with critical care, and discussed  findings as well as pertinent plan - they recommend: Will admit to [facility].  Patient may be a candidate for interventional radiology.  Will initiate heparinization  Reevaluation: After the interventions noted above, I reevaluated the patient and found that they have :stayed the same  Complexity of problems addressed: Patient's presentation is most consistent with  acute presentation with potential threat to life or bodily function  Disposition: After consideration of the diagnostic results and the patient's response to treatment,  I feel that the patent would benefit from admission  .   Patient stable at this time with 3 L nasal cannula.  Patient is started on heparin .  He denies any recent GI bleed or blood loss.     Final diagnoses:  Acute saddle pulmonary embolism with acute cor pulmonale (HCC)  Acute respiratory  failure with hypoxia [facility])    ED Discharge Orders     None          Midge Golas, MD 01/19/24 [phone]

## 2024-01-19 NOTE — Progress Notes (Signed)
 eLink Physician-Brief Progress Note Patient Name: HENCE DERRICK DOB: 1966-02-07 MRN: [id number]   Date of Service  01/19/2024  HPI/Events of Note  CHARLIES RAYBURN is a 58 y.o. male with medical history significant of  asthma, CAD s/p 3 stents, PAD with stents in the right lower extremity, HTN, HLD, Asthma found to have a submassive pulmonary embolus.  Discussed with IR, no intervention anticipated. Patient is hypertensive, but otherwise saturating 98% on 3 L of oxygen .  Laboratory studies show hypokalemia, anion gap metabolic acidosis, mild leukocytosis.  eICU Interventions  Maintain systemic anticoagulation with heparin   Continue antihypertensives as scheduled with amlodipine , isosorbide , and metoprolol   Echocardiogram pending  DVT prophylaxis with therapeutic heparin  GI prophylaxis not indicated     Intervention Category Evaluation Type: New Patient Evaluation  Sherrel Shafer 01/19/2024, 6:50 AM

## 2024-01-19 NOTE — H&P (Signed)
 History and Physical    Gerald Ellis FMW:[id number] DOB: Oct 03, 1965 DOA: 01/19/2024  PCP: Valora Agent, MD Patient coming from: Home  Chief Complaint: shortness of breath and chest pain  HPI: Gerald Ellis is a 58 y.o. male with medical history significant of  asthma, CAD s/p 3 stents, PAD with stents in the right lower extremity, HTN, HLD, Asthma.  The patient states that on Friday he was outside mowing the lawn and reported that he hit his left leg. He states that he experienced a sharp pain in the lower leg. Once he was inside the house he took tylenol  to help with alleviation of his pain. He states that he continued to have ongoing pain in the left lower extremity on Saturday. However, he thought that it might go away. He states that he drove his daughter home from his house and when he stepped on the gas with his left foot, he had significant pain more than usual. He told his wife and took more over the counter medication to help with the pain. If it did not improve he was planning to come to the hospital. He was still having the same pain on Sunday morning. He took more medication but states that he went outside to dig up dirt with his granddaughter this was around 3pm, he started to experience some difficulty breathing. Given he an asthmatic, he decided to use his inhaler and lye down inside until he felt a little better. Around 6pm  he woke up and used his wheelchair to get to the restroom. He used to the commode and then reports while transferring back to his wheelchair he had significant dyspnea in addition to left back and leg pain. Finally he decided to call EMS and present to the hospital for further evaluation.    ED Course:  /In the ER, BP175/130, HR 98, RR 20, O2 saturation 94% on 3L [place],  and Tmax 98.2. Cbc demonstrated wbc 10.6, hb/hct 14.3/41.3, and platelet 258. Chemistry demonstrated Na 135, K 3.2, Cl 97, bicarb 22, Bun/Cr 9/0.81 and glucose 316. Anion gap is 16. Initial  troponin was 55 and repeat 66.  CXR demonstrated no acute cardiopulmonary findings.  CTA chest: . Saddle embolus with the dominant embolic burden within the right pulmonary artery but with additional filling defects within the left upper lobar and segmental pulmonary arteries. The embolic burden is large. No CT evidence of right heart strain. EKG findings sinus tachycardia.  Review of Systems:  All systems reviewed and apart from history of presenting illness, are negative.  Past Medical History:  Diagnosis Date   Benign enlargement of prostate    Bronchitis    Childhood asthma    Diabetes mellitus without complication (HCC)    Environmental allergies    Erectile dysfunction    Headache    Hemorrhoids    Hypercholesteremia    Hypertension    Hypogonadism in male    Lumbago    Over weight     Past Surgical History:  Procedure Laterality Date   AMPUTATION Right 05/15/2023   Procedure: AMPUTATION BELOW KNEE;  Surgeon: Marea Selinda RAMAN, MD;  Location: [facility] ORS;  Service: General;  Laterality: Right;   AMPUTATION Right 05/16/2023   Procedure: REVISION OF BELOW KNEE AMPUTATION;  Surgeon: Marea Selinda RAMAN, MD;  Location: [facility];  Service: General;  Laterality: Right;   BACK SURGERY  1992   CHOLECYSTECTOMY     COLONOSCOPY N/A 12/26/2023   Procedure: COLONOSCOPY;  Surgeon: Maryruth Smalls  T, MD;  Location: [facility] ENDOSCOPY;  Service: Endoscopy;  Laterality: N/A;   COLONOSCOPY WITH PROPOFOL  N/A 08/07/2015   Procedure: COLONOSCOPY WITH PROPOFOL ;  Surgeon: Gladis RAYMOND Mariner, MD;  Location: [facility] ENDOSCOPY;  Service: Endoscopy;  Laterality: N/A;   COLONOSCOPY WITH PROPOFOL  N/A 08/08/2015   Procedure: COLONOSCOPY WITH PROPOFOL ;  Surgeon: Gladis RAYMOND Mariner, MD;  Location: [facility] ENDOSCOPY;  Service: Endoscopy;  Laterality: N/A;   COLONOSCOPY WITH PROPOFOL  N/A 09/16/2017   Procedure: COLONOSCOPY WITH PROPOFOL ;  Surgeon: Mariner Gladis RAYMOND, MD;  Location: [facility] ENDOSCOPY;  Service: Endoscopy;  Laterality: N/A;    COLONOSCOPY WITH PROPOFOL  N/A 12/20/2020   Procedure: COLONOSCOPY WITH PROPOFOL ;  Surgeon: Dessa Reyes ORN, MD;  Location: [facility] ENDOSCOPY;  Service: Endoscopy;  Laterality: N/A;  IDDM   CORONARY STENT INTERVENTION N/A 04/20/2019   Procedure: CORONARY STENT INTERVENTION;  Surgeon: Ammon Blunt, MD;  Location: [facility] INVASIVE CV LAB;  Service: Cardiovascular;  Laterality: N/A;   ESOPHAGOGASTRODUODENOSCOPY N/A 12/26/2023   Procedure: EGD (ESOPHAGOGASTRODUODENOSCOPY);  Surgeon: Maryruth Ole DASEN, MD;  Location: [facility] ENDOSCOPY;  Service: Endoscopy;  Laterality: N/A;   ESOPHAGOGASTRODUODENOSCOPY (EGD) WITH PROPOFOL  N/A 08/07/2015   Procedure: ESOPHAGOGASTRODUODENOSCOPY (EGD) WITH PROPOFOL ;  Surgeon: Gladis RAYMOND Mariner, MD;  Location: [facility] ENDOSCOPY;  Service: Endoscopy;  Laterality: N/A;   ESOPHAGOGASTRODUODENOSCOPY (EGD) WITH PROPOFOL  N/A 09/16/2017   Procedure: ESOPHAGOGASTRODUODENOSCOPY (EGD) WITH PROPOFOL ;  Surgeon: Mariner Gladis RAYMOND, MD;  Location: [facility] ENDOSCOPY;  Service: Endoscopy;  Laterality: N/A;   ESOPHAGOGASTRODUODENOSCOPY (EGD) WITH PROPOFOL  N/A 12/20/2020   Procedure: ESOPHAGOGASTRODUODENOSCOPY (EGD) WITH PROPOFOL ;  Surgeon: Dessa Reyes ORN, MD;  Location: [facility] ENDOSCOPY;  Service: Endoscopy;  Laterality: N/A;   HEMOSTASIS CLIP PLACEMENT  12/26/2023   Procedure: CONTROL OF HEMORRHAGE, GI TRACT, ENDOSCOPIC, BY CLIPPING OR OVERSEWING;  Surgeon: Maryruth Ole DASEN, MD;  Location: [facility] ENDOSCOPY;  Service: Endoscopy;;   IRRIGATION AND DEBRIDEMENT FOOT Right 05/12/2023   Procedure: IRRIGATION AND DEBRIDEMENT FOOT, WASH OUT;  Surgeon: Neill Boas, DPM;  Location: [facility] ORS;  Service: Orthopedics/Podiatry;  Laterality: Right;   LEFT HEART CATH AND CORONARY ANGIOGRAPHY Left 04/20/2019   Procedure: LEFT HEART CATH AND CORONARY ANGIOGRAPHY;  Surgeon: Ammon Blunt, MD;  Location: [facility] CV LAB;  Service: Cardiovascular;  Laterality: Left;   LOWER EXTREMITY ANGIOGRAPHY Right  05/09/2023   Procedure: Lower Extremity Angiography;  Surgeon: Jama Cordella MATSU, MD;  Location: [facility] CV LAB;  Service: Cardiovascular;  Laterality: Right;   POLYPECTOMY  12/26/2023   Procedure: POLYPECTOMY, INTESTINE;  Surgeon: Maryruth Ole DASEN, MD;  Location: [facility] ENDOSCOPY;  Service: Endoscopy;;   TRANSMETATARSAL AMPUTATION Right 05/08/2023   Procedure: TRANSMETATARSAL AMPUTATION;  Surgeon: Neill Boas, DPM;  Location: [facility] ORS;  Service: Orthopedics/Podiatry;  Laterality: Right;     reports that he has never smoked. He has never used smokeless tobacco. He reports current alcohol use. He reports that he does not use drugs.  Allergies  Allergen Reactions   Ibuprofen Shortness Of Breath   Nsaids Swelling    Swelling of throat   Fish Oil Nausea Only, Other (See Comments) and Nausea And Vomiting    Acid reflux    Family History  Problem Relation Age of Onset   Heart disease Mother        CABG   Lung cancer Mother        Disseminated   Hypertension Mother    Hyperlipidemia Mother    Diabetes Father        mother   Colon cancer Father 62   Cancer Maternal Aunt  x3 aunts, unk types of cancer   Colon cancer Maternal Uncle        x3 uncles   Prostate cancer Maternal Uncle        x4 uncles   Cancer Paternal Uncle        prostate vs colon   Alzheimer's disease Other    Kidney disease Neg Hx     Prior to Admission medications   Medication Sig Start Date End Date Taking? Authorizing Provider  amLODipine  (NORVASC ) 5 MG tablet Take 1 tablet (5 mg total) by mouth daily. 04/21/19   Clarisa Kung, PA-C  aspirin  81 MG chewable tablet Chew 1 tablet (81 mg total) by mouth daily. 04/21/19   Clarisa Kung, PA-C  atorvastatin  (LIPITOR ) 80 MG tablet Take 1 tablet (80 mg total) by mouth daily at 6 PM. 04/21/19   Clarisa Kung, PA-C  busPIRone  (BUSPAR ) 7.5 MG tablet Take 7.5 mg by mouth 2 (two) times daily.    [provider]  cetirizine (ZYRTEC) 10 MG tablet Take 10 mg by  mouth daily.    [provider]  Continuous Glucose Sensor (FREESTYLE LIBRE 2 SENSOR) MISC USE 1 KIT EVERY 14 (FOURTEEN) DAYS FOR GLUCOSE MONITORING 05/12/23   [provider]  diclofenac Sodium (VOLTAREN) 1 % GEL Apply 2 g topically 4 (four) times daily.    [provider]  feeding supplement (ENSURE ENLIVE / ENSURE PLUS) LIQD Take 237 mLs by mouth 2 (two) times daily between meals. 05/26/23   Amin, Sumayya, MD  fluticasone  (FLONASE ) 50 MCG/ACT nasal spray Place 1 spray into both nostrils 2 (two) times daily as needed (nasal congestion).    [provider]  Fluticasone -Umeclidin-Vilant 200-62.5-25 MCG/INH AEPB Inhale into the lungs. 04/24/20   [provider]  gabapentin  (NEURONTIN ) 300 MG capsule Take 1 capsule (300 mg total) by mouth daily AND 2 capsules (600 mg total) at bedtime. 300 mg in the morning and 600 mg at bedtime. 05/26/23   Caleen Qualia, MD  HUMALOG KWIKPEN 200 UNIT/ML KwikPen Inject 22 units three times daily before meals. 06/12/23   [provider]  HYDROcodone -acetaminophen  (NORCO/VICODIN) 5-325 MG tablet Take 1-2 tablets by mouth every 4 (four) hours as needed for severe pain (pain score 7-10) or moderate pain (pain score 4-6). 05/26/23   Amin, Sumayya, MD  insulin  aspart (NOVOLOG ) 100 UNIT/ML injection Inject 3 Units into the skin 3 (three) times daily with meals. 05/26/23   Amin, Sumayya, MD  insulin  aspart (NOVOLOG ) 100 UNIT/ML injection Inject 0-20 Units into the skin every 4 (four) hours. 05/26/23   Amin, Sumayya, MD  insulin  degludec (TRESIBA  FLEXTOUCH) 200 UNIT/ML FlexTouch Pen Inject 70 units nightly 06/12/23   [provider]  insulin  glargine-yfgn (SEMGLEE ) 100 UNIT/ML injection Inject 0.4 mLs (40 Units total) into the skin at bedtime. 05/26/23   Amin, Sumayya, MD  isosorbide  mononitrate (IMDUR ) 30 MG 24 hr tablet Take 30 mg by mouth daily.    [provider]  losartan  (COZAAR ) 50 MG tablet Take 1 tablet (50 mg  total) by mouth daily. 05/26/23   Amin, Sumayya, MD  magnesium  hydroxide (MILK OF MAGNESIA) 400 MG/5ML suspension Take 30 mLs by mouth at bedtime as needed for mild constipation or moderate constipation. 05/26/23   Amin, Sumayya, MD  metFORMIN (GLUCOPHAGE) 1000 MG tablet Take 1,000 mg by mouth 2 (two) times daily.     [provider]  metoCLOPramide  (REGLAN ) 5 MG tablet Take 1 tablet (5 mg total) by mouth every 8 (eight) hours  as needed for nausea or vomiting. 05/26/23   Amin, Sumayya, MD  metoprolol  succinate (TOPROL -XL) 50 MG 24 hr tablet Take 50 mg by mouth daily. Take with or immediately following a meal.    [provider]  montelukast  (SINGULAIR ) 10 MG tablet Take by mouth. 12/15/19 12/02/23  [provider]  Multiple Vitamins-Minerals (MULTIVITAMIN WITH MINERALS) tablet Take 1 tablet by mouth 2 (two) times daily.     [provider]  mupirocin  ointment (BACTROBAN ) 2 % SMARTSIG:1 Application Topical 2-3 Times Daily 04/06/20   [provider]  naloxone (NARCAN) nasal spray 4 mg/0.1 mL Place into the nose. 06/05/23   [provider]  Omega-3 Fatty Acids (FISH OIL) 500 MG CAPS Take 500 mg by mouth 2 (two) times daily.    [provider]  ondansetron  (ZOFRAN -ODT) 4 MG disintegrating tablet Take 1 tablet (4 mg total) by mouth every 8 (eight) hours as needed for nausea or vomiting. 04/25/23   Lang Dover, MD  oxyCODONE -acetaminophen  (PERCOCET) 10-325 MG tablet Take 1 tablet by mouth.    [provider]  pantoprazole  (PROTONIX ) 40 MG tablet Take 1 tablet (40 mg total) by mouth daily. 04/25/23 04/24/24  Lang Dover, MD  pioglitazone  (ACTOS ) 15 MG tablet Take 15 mg by mouth 2 (two) times daily.    [provider]  polyethylene glycol (MIRALAX  / GLYCOLAX ) 17 g packet Take 17 g by mouth daily. 05/26/23   Amin, Sumayya, MD  prasugrel  (EFFIENT ) 10 MG TABS tablet Take 1 tablet (10 mg total) by mouth daily. 04/21/19   Clarisa Kung,  PA-C  sucralfate (CARAFATE) 1 GM/10ML suspension Take 1 g by mouth 4 (four) times daily -  with meals and at bedtime. Patient not taking: Reported on 12/26/2023 04/30/23 04/29/24  [provider]  tadalafil (CIALIS) 20 MG tablet Take 20 mg by mouth daily as needed for erectile dysfunction.    [provider]    Physical Exam: Vitals:   01/19/24 0200 01/19/24 0230 01/19/24 0315 01/19/24 0400  BP: (!) 166/101 (!) 179/99 (!) 176/103 (!) 183/110  Pulse: (!) 111 (!) 107 (!) 106 97  Resp: (!) 22 17 14 17   Temp:      TempSrc:      SpO2: 90% 92% 94% 94%  Weight:      Height:        Physical Exam Constitutional:      General: He is not in acute distress.    Appearance: Normal appearance.  HENT:     Head: Normocephalic and atraumatic.  Eyes:     Extraocular Movements: Extraocular movements intact.     Conjunctiva/sclera: Conjunctivae normal.     Pupils: Pupils are equal, round, and reactive to light.  Cardiovascular:     Rate and Rhythm: tachycardia      Pulses: Normal pulses.     Heart sounds: Normal heart sounds.  Pulmonary:     Effort: Pulmonary effort is normal. No respiratory distress.     Breath sounds: Normal breath sounds. No wheezing, rhonchi or rales.  Abdominal:     General: Abdomen is flat. Bowel sounds are normal. There is no distension.     Palpations: Abdomen is soft.     Tenderness: There is no abdominal tenderness.  Musculoskeletal:        General:   foot amputation of the right. Normal range of motion.  Skin:    General: Skin is warm and dry.     Coloration: Skin is not jaundiced.  Neurological:  General: No focal deficit present.     Mental Status: He is alert and oriented to person, place, and time. Mental status is at baseline.   Labs on Admission: I have personally reviewed following labs and imaging studies  CBC: Recent Labs  Lab 01/19/24 0132  WBC 10.6*  HGB 14.3  HCT 41.3  MCV 82.9  PLT 258   Basic Metabolic Panel: Recent  Labs  Lab 01/19/24 0132  NA 135  K 3.2*  CL 97*  CO2 22  GLUCOSE 316*  BUN 9  CREATININE 0.81  CALCIUM  8.9   GFR: Estimated Creatinine Clearance: 124.4 mL/min (by C-G formula based on SCr of 0.81 mg/dL). Liver Function Tests: No results for input(s): AST, ALT, ALKPHOS, BILITOT, PROT, ALBUMIN in the last 168 hours. No results for input(s): LIPASE, AMYLASE in the last 168 hours. No results for input(s): AMMONIA in the last 168 hours. Coagulation Profile: Recent Labs  Lab 01/19/24 0336  INR 1.0   Cardiac Enzymes: No results for input(s): CKTOTAL, CKMB, CKMBINDEX, TROPONINI in the last 168 hours. BNP (last 3 results) No results for input(s): PROBNP in the last 8760 hours. HbA1C: No results for input(s): HGBA1C in the last 72 hours. CBG: No results for input(s): GLUCAP in the last 168 hours. Lipid Profile: No results for input(s): CHOL, HDL, LDLCALC, TRIG, CHOLHDL, LDLDIRECT in the last 72 hours. Thyroid Function Tests: No results for input(s): TSH, T4TOTAL, FREET4, T3FREE, THYROIDAB in the last 72 hours. Anemia Panel: No results for input(s): VITAMINB12, FOLATE, FERRITIN, TIBC, IRON, RETICCTPCT in the last 72 hours. Urine analysis:    Component Value Date/Time   COLORURINE YELLOW (A) 06/12/2015 1252   APPEARANCEUR CLEAR (A) 06/12/2015 1252   LABSPEC 1.042 (H) 06/12/2015 1252   PHURINE 6.0 06/12/2015 1252   GLUCOSEU >500 (A) 06/12/2015 1252   HGBUR NEGATIVE 06/12/2015 1252   BILIRUBINUR NEGATIVE 06/12/2015 1252   KETONESUR TRACE (A) 06/12/2015 1252   PROTEINUR 100 (A) 06/12/2015 1252   NITRITE NEGATIVE 06/12/2015 1252   LEUKOCYTESUR NEGATIVE 06/12/2015 1252    Radiological Exams on Admission: CT Angio Chest PE W and/or Wo Contrast Addendum Date: 01/19/2024 ADDENDUM REPORT: 01/19/2024 03:30 ADDENDUM: These results were called by telephone at the time of interpretation on 01/19/2024 at 3:30 am to provider  DONALD WICKLINE , who verbally acknowledged these results. Electronically Signed   By: Dorethia Molt M.D.   On: 01/19/2024 03:30   Result Date: 01/19/2024 CLINICAL DATA:  High probability pulmonary embolism, dyspnea, back pain EXAM: CT ANGIOGRAPHY CHEST WITH CONTRAST TECHNIQUE: Multidetector CT imaging of the chest was performed using the standard protocol during bolus administration of intravenous contrast. Multiplanar CT image reconstructions and MIPs were obtained to evaluate the vascular anatomy. RADIATION DOSE REDUCTION: This exam was performed according to the departmental dose-optimization program which includes automated exposure control, adjustment of the mA and/or kV according to patient size and/or use of iterative reconstruction technique. CONTRAST:  75mL OMNIPAQUE  IOHEXOL  350 MG/ML SOLN COMPARISON:  03/22/2021 FINDINGS: Cardiovascular: There is adequate opacification of the pulmonary arterial tree. There is a saddle embolus present with the dominant embolic burden within the right pulmonary artery but with additional filling defect seen within the left upper lobar and segmental pulmonary arteries. The embolic burden is large. The central pulmonary arteries are dilated in keeping with changes of pulmonary arterial hypertension. Global cardiac size, however, is within normal limits there is no CT evidence of right heart strain. Extensive multi-vessel coronary artery calcification. No pericardial effusion. The thoracic  aorta is unremarkable. Mediastinum/Nodes: No enlarged mediastinal, hilar, or axillary lymph nodes. Thyroid gland, trachea, and esophagus demonstrate no significant findings. Lungs/Pleura: Lungs are clear. No pleural effusion or pneumothorax. Upper Abdomen: No acute abnormality. Musculoskeletal: No acute bone abnormality. No lytic or blastic bone lesion. Osseous structures are age appropriate. Review of the MIP images confirms the above findings. IMPRESSION: 1. Saddle embolus with the  dominant embolic burden within the right pulmonary artery but with additional filling defects within the left upper lobar and segmental pulmonary arteries. The embolic burden is large. No CT evidence of right heart strain. 2. Morphologic changes in keeping with pulmonary arterial hypertension. 3. Extensive multi-vessel coronary artery calcification. Electronically Signed: By: Dorethia Molt M.D. On: 01/19/2024 03:14   DG Chest Portable 1 View Result Date: 01/19/2024 CLINICAL DATA:  Dyspnea EXAM: PORTABLE CHEST 1 VIEW COMPARISON:  None Available. FINDINGS: The heart size and mediastinal contours are within normal limits. Both lungs are clear. The visualized skeletal structures are unremarkable. IMPRESSION: No active disease. Electronically Signed   By: Dorethia Molt M.D.   On: 01/19/2024 03:03   Assessment/Plan Principal Problem:   Saddle embolus of pulmonary artery (HCC)   Saddle embolus of pulmonary artery Patient has been started on heparin  drip at this time Troponin was elevated at 55 and then 66 Echocardiogram will be ordered for further evaluation Case was discussed with IR who stated that if patient is stable clinically no intervention is planned If the patient starts to decline then they recommend transfer for intervention  In the meantime complete the workup Patient will also have duplex ultrasound of the lower extremities  CAD s/p 3 stent placement PAD with stent placement Will restart on effient  and other meds  DM type 2 Will start on sliding scale insulin  low  Will add lantus  as needed  HLD Patient is on statin therapy  Hypertension Blood pressure is uncontrolled at this time Will restart on home medications Will start on prn medication as well  Asthma Will continue his home inhalers at this time  DVT prophylaxis: heparin  drip  Code Status: full  Family Communication: wife and daughter at bedside  Disposition Plan: Status is: Inpatient Remains inpatient appropriate  because: saddle embolus requiring heparin  drip and possible intervention  Consults called: interventional radiology  Admission status: inpatient  Level of care: Level of care: ICU The medical decision making on this patient was of high complexity and the patient is at high risk for clinical deterioration, therefore this is a level 3 visit.  Bradly MARLA Drones MD [facility]  01/19/2024, 4:37 AM

## 2024-01-19 NOTE — Progress Notes (Signed)
 PHARMACY - ANTICOAGULATION CONSULT NOTE  Pharmacy Consult for heparin  Indication: pulmonary embolus  Allergies  Allergen Reactions   Ibuprofen Shortness Of Breath   Nsaids Swelling    Swelling of throat   Fish Oil Nausea Only, Other (See Comments) and Nausea And Vomiting    Acid reflux    Patient Measurements: Height: 5' 9 (175.3 cm) Weight: 110 kg (242 lb 8.1 oz) IBW/kg (Calculated) : 70.7 HEPARIN  DW (KG): 94.9  Vital Signs: Temp: 98.2 F (36.8 C) (09/01 0122) Temp Source: Oral (09/01 0122) BP: 179/99 (09/01 0230) Pulse Rate: 107 (09/01 0230)  Labs: Recent Labs    01/19/24 0132  HGB 14.3  HCT 41.3  PLT 258  CREATININE 0.81  TROPONINIHS 55*    Estimated Creatinine Clearance: 124.4 mL/min (by C-G formula based on SCr of 0.81 mg/dL).   Medical History: Past Medical History:  Diagnosis Date   Benign enlargement of prostate    Bronchitis    Childhood asthma    Diabetes mellitus without complication (HCC)    Environmental allergies    Erectile dysfunction    Headache    Hemorrhoids    Hypercholesteremia    Hypertension    Hypogonadism in male    Lumbago    Over weight     Assessment: 58yo male c/o LLE and back pain since hitting a bump while on his lawnmower, CT reveals saddle PE with large embolic burden but no evidence of RHS >> to begin heparin .  Goal of Therapy:  Heparin  level 0.3-0.7 units/ml Monitor platelets by anticoagulation protocol: Yes   Plan:  Heparin  5000 units IV bolus followed by infusion at 1800 units/hr. Monitor heparin  levels and CBC.  Marvetta Dauphin, PharmD, BCPS  01/19/2024,3:32 AM

## 2024-01-19 NOTE — Progress Notes (Signed)
 PHARMACY - ANTICOAGULATION CONSULT NOTE  Pharmacy Consult for heparin  Indication: pulmonary embolus  Allergies  Allergen Reactions   Ibuprofen Shortness Of Breath   Nsaids Swelling    Swelling of throat   Fish Oil Nausea Only, Other (See Comments) and Nausea And Vomiting    Acid reflux    Patient Measurements: Height: 5' 9 (175.3 cm) Weight: 110 kg (242 lb 8.1 oz) IBW/kg (Calculated) : 70.7 HEPARIN  DW (KG): 94.9  Vital Signs: Temp: 98 F (36.7 C) (09/01 0722) Temp Source: Oral (09/01 0722) BP: 174/92 (09/01 0937) Pulse Rate: 96 (09/01 0937)  Labs: Recent Labs    01/19/24 0132 01/19/24 0336 01/19/24 1029  HGB 14.3  --   --   HCT 41.3  --   --   PLT 258  --   --   APTT  --  26  --   LABPROT  --  14.2  --   INR  --  1.0  --   HEPARINUNFRC  --   --  0.25*  CREATININE 0.81  --   --   TROPONINIHS 55* 66*  --     Estimated Creatinine Clearance: 124.4 mL/min (by C-G formula based on SCr of 0.81 mg/dL).   Medical History: Past Medical History:  Diagnosis Date   Benign enlargement of prostate    Bronchitis    Childhood asthma    Diabetes mellitus without complication (HCC)    Environmental allergies    Erectile dysfunction    Headache    Hemorrhoids    Hypercholesteremia    Hypertension    Hypogonadism in male    Lumbago    Over weight     Assessment: 58yo male c/o LLE and back pain since hitting a bump while on his lawnmower, CT reveals saddle PE with large embolic burden but no evidence of RHS >> to begin heparin .  HL 0.25- subtherapeutic  Goal of Therapy:  Heparin  level 0.3-0.7 units/ml Monitor platelets by anticoagulation protocol: Yes   Plan:  Rebolus heparin  1200 units IV x 1  Increase heparin  infusion to 2000 units/hr Heparin  level in 6 hours and daily Continue to monitor H&H and platelets.   Elspeth Sour, PharmD Clinical Pharmacist 01/19/2024 11:07 AM

## 2024-01-19 NOTE — Progress Notes (Signed)
  Echocardiogram 2D Echocardiogram has been performed.  Gerald Ellis 01/19/2024, 2:44 PM

## 2024-01-19 NOTE — ED Notes (Signed)
 Patient transported to CT

## 2024-01-19 NOTE — Plan of Care (Signed)
 Planned to order doppler of the lower extremities but am unable to find it in the orders.

## 2024-01-19 NOTE — Progress Notes (Addendum)
 PROGRESS NOTE   Gerald Ellis  FMW:[id number] DOB: 10/31/1965 DOA: 01/19/2024 PCP: Valora Agent, MD   Chief Complaint  Patient presents with   Back Pain   Level of care: ICU  Brief Admission History:  58 y.o. male with medical history significant of  asthma, CAD s/p 3 stents, PAD with stents in the right lower extremity, HTN, HLD, Asthma.  The patient states that on Friday he was outside mowing the lawn and reported that he hit his left leg. He states that he experienced a sharp pain in the lower leg. Once he was inside the house he took tylenol  to help with alleviation of his pain. He states that he continued to have ongoing pain in the left lower extremity on Saturday. However, he thought that it might go away. He states that he drove his daughter home from his house and when he stepped on the gas with his left foot, he had significant pain more than usual. Around 6pm he woke up and used his wheelchair to get to the restroom. He used to the commode and then reports while transferring back to his wheelchair he had significant dyspnea in addition to left back and leg pain. Finally he decided to call EMS and present to the hospital for further evaluation.  CTA chest: . Saddle embolus with the dominant embolic burden within the right pulmonary artery but with additional filling defects within the left upper lobar and segmental pulmonary arteries. The embolic burden is large. No CT evidence of right heart strain. EKG findings sinus tachycardia.      Assessment and Plan:  Saddle embolus of pulmonary artery Patient has been started on heparin  drip at this time, plan to continue for 24-48 hours Troponin was elevated at 55 and then 66 Echocardiogram will be ordered for further evaluation to rule out right heart strain Case was discussed with IR who stated that if patient is stable clinically no intervention is planned If the patient starts to decline then they recommend transfer for intervention   In the meantime complete the workup Patient will also have duplex ultrasound of the lower extremities Requested benefits check for apixaban     CAD s/p 3 stent placement PAD with stent placement Will restart on effient  and other meds   DM type 2, uncontrolled Remains on sliding scale insulin   increased to resistant Added prandial coverage and basal coverage: semglee  30 units, novolog  10 units TID with meals, CBG 5x/day CBG (last 3)  Recent Labs    01/19/24 0751 01/19/24 1114  GLUCAP 228* 225*    HLD Patient is on statin therapy   Hypertension Blood pressure is uncontrolled at this time Will restart on home medications Will start on prn medication as well   Asthma Will continue his home inhalers at this time   DVT prophylaxis: IV heparin  infusion  Code Status: Full  Family Communication:  Disposition: home    Consultants:  Pharm D Procedures:   Antimicrobials:    Subjective: Pt reports he is still SOB but some improvement in chest discomfort this morning.   Objective: Vitals:   01/19/24 0600 01/19/24 0722 01/19/24 0937 01/19/24 1133  BP: (!) 174/103  (!) 174/92   Pulse: 92  96   Resp: 17     Temp:  98 F (36.7 C)  98.1 F (36.7 C)  TempSrc:  Oral  Oral  SpO2: 93%     Weight:      Height:       No intake  or output data in the 24 hours ending 01/19/24 1225 Filed Weights   01/19/24 0121  Weight: 110 kg   Examination:  General exam: Appears calm and comfortable  Respiratory system: Clear to auscultation. Respiratory effort normal. Cardiovascular system: normal S1 & S2 heard. No JVD, murmurs, rubs, gallops or clicks. No pedal edema. Gastrointestinal system: Abdomen is nondistended, soft and nontender. No organomegaly or masses felt. Normal bowel sounds heard. Central nervous system: Alert and oriented. No focal neurological deficits. Extremities: right BKA, Symmetric 5 x 5 power. Skin: No rashes, lesions or ulcers. Psychiatry: Judgement and insight  appear normal. Mood & affect appropriate.   Data Reviewed: I have personally reviewed following labs and imaging studies  CBC: Recent Labs  Lab 01/19/24 0132  WBC 10.6*  HGB 14.3  HCT 41.3  MCV 82.9  PLT 258    Basic Metabolic Panel: Recent Labs  Lab 01/19/24 0132  NA 135  K 3.2*  CL 97*  CO2 22  GLUCOSE 316*  BUN 9  CREATININE 0.81  CALCIUM  8.9    CBG: Recent Labs  Lab 01/19/24 0751 01/19/24 1114  GLUCAP 228* 225*    No results found for this or any previous visit (from the past 240 hours).   Radiology Studies: CT Angio Chest PE W and/or Wo Contrast Addendum Date: 01/19/2024 ADDENDUM REPORT: 01/19/2024 03:30 ADDENDUM: These results were called by telephone at the time of interpretation on 01/19/2024 at 3:30 am to provider DONALD WICKLINE , who verbally acknowledged these results. Electronically Signed   By: Dorethia Molt M.D.   On: 01/19/2024 03:30   Result Date: 01/19/2024 CLINICAL DATA:  High probability pulmonary embolism, dyspnea, back pain EXAM: CT ANGIOGRAPHY CHEST WITH CONTRAST TECHNIQUE: Multidetector CT imaging of the chest was performed using the standard protocol during bolus administration of intravenous contrast. Multiplanar CT image reconstructions and MIPs were obtained to evaluate the vascular anatomy. RADIATION DOSE REDUCTION: This exam was performed according to the departmental dose-optimization program which includes automated exposure control, adjustment of the mA and/or kV according to patient size and/or use of iterative reconstruction technique. CONTRAST:  75mL OMNIPAQUE  IOHEXOL  350 MG/ML SOLN COMPARISON:  03/22/2021 FINDINGS: Cardiovascular: There is adequate opacification of the pulmonary arterial tree. There is a saddle embolus present with the dominant embolic burden within the right pulmonary artery but with additional filling defect seen within the left upper lobar and segmental pulmonary arteries. The embolic burden is large. The central  pulmonary arteries are dilated in keeping with changes of pulmonary arterial hypertension. Global cardiac size, however, is within normal limits there is no CT evidence of right heart strain. Extensive multi-vessel coronary artery calcification. No pericardial effusion. The thoracic aorta is unremarkable. Mediastinum/Nodes: No enlarged mediastinal, hilar, or axillary lymph nodes. Thyroid gland, trachea, and esophagus demonstrate no significant findings. Lungs/Pleura: Lungs are clear. No pleural effusion or pneumothorax. Upper Abdomen: No acute abnormality. Musculoskeletal: No acute bone abnormality. No lytic or blastic bone lesion. Osseous structures are age appropriate. Review of the MIP images confirms the above findings. IMPRESSION: 1. Saddle embolus with the dominant embolic burden within the right pulmonary artery but with additional filling defects within the left upper lobar and segmental pulmonary arteries. The embolic burden is large. No CT evidence of right heart strain. 2. Morphologic changes in keeping with pulmonary arterial hypertension. 3. Extensive multi-vessel coronary artery calcification. Electronically Signed: By: Dorethia Molt M.D. On: 01/19/2024 03:14   DG Chest Portable 1 View Result Date: 01/19/2024 CLINICAL DATA:  Dyspnea EXAM:  PORTABLE CHEST 1 VIEW COMPARISON:  None Available. FINDINGS: The heart size and mediastinal contours are within normal limits. Both lungs are clear. The visualized skeletal structures are unremarkable. IMPRESSION: No active disease. Electronically Signed   By: Dorethia Molt M.D.   On: 01/19/2024 03:03   Scheduled Meds:  amLODipine   5 mg Oral Daily   atorvastatin   80 mg Oral q1800   Chlorhexidine  Gluconate Cloth  6 each Topical Daily   docusate sodium   100 mg Oral BID   insulin  aspart  0-20 Units Subcutaneous TID WC   insulin  aspart  0-5 Units Subcutaneous QHS   insulin  aspart  6 Units Subcutaneous TID WC   isosorbide  mononitrate  30 mg Oral Daily    loratadine   10 mg Oral Daily   metoprolol  succinate  50 mg Oral Daily   montelukast   10 mg Oral QHS   prasugrel   10 mg Oral Daily   senna  1 tablet Oral BID   Continuous Infusions:  heparin  2,000 Units/hr (01/19/24 1121)    LOS: 0 days   Time spent: prolonged services 50 mins  Ayomide Zuleta Vicci, MD How to contact the [facility] Attending or Consulting provider 7A - 7P or covering provider during after hours 7P -7A, for this patient?  Check the care team in [facility] and look for a) attending/consulting [facility] provider listed and b) the [facility] team listed Log into [email or web address] to find provider on call.  Locate the [facility] provider you are looking for under [facility] and page to a number that you can be directly reached. If you still have difficulty reaching the provider, please page the [facility] (Director on Call) for the Hospitalists listed on amion for assistance.  01/19/2024, 12:25 PM

## 2024-01-19 NOTE — Progress Notes (Signed)
 PHARMACY - ANTICOAGULATION CONSULT NOTE  Pharmacy Consult for heparin  Indication: pulmonary embolus  Allergies  Allergen Reactions   Ibuprofen Shortness Of Breath   Nsaids Swelling    Swelling of throat   Fish Oil Nausea And Vomiting, Nausea Only and Other (See Comments)    Acid reflux    Patient Measurements: Height: 5' 9 (175.3 cm) Weight: 110 kg (242 lb 8.1 oz) IBW/kg (Calculated) : 70.7 HEPARIN  DW (KG): 94.9  Vital Signs: Temp: 98.3 F (36.8 C) (09/01 2000) Temp Source: Oral (09/01 1600) BP: 154/80 (09/01 2000) Pulse Rate: 86 (09/01 2019)  Labs: Recent Labs    01/19/24 0132 01/19/24 0336 01/19/24 1029 01/19/24 1439 01/19/24 2016  HGB 14.3  --   --   --   --   HCT 41.3  --   --   --   --   PLT 258  --   --   --   --   APTT  --  26  --   --   --   LABPROT  --  14.2  --   --   --   INR  --  1.0  --   --   --   HEPARINUNFRC  --   --  0.25* 0.41 0.43  CREATININE 0.81  --   --   --   --   TROPONINIHS 55* 66*  --   --   --     Estimated Creatinine Clearance: 124.4 mL/min (by C-G formula based on SCr of 0.81 mg/dL).   Medical History: Past Medical History:  Diagnosis Date   Benign enlargement of prostate    Bronchitis    Childhood asthma    Diabetes mellitus without complication (HCC)    Environmental allergies    Erectile dysfunction    Headache    Hemorrhoids    Hypercholesteremia    Hypertension    Hypogonadism in male    Lumbago    Over weight     Assessment: 58yo male c/o LLE and back pain since hitting a bump while on his lawnmower, CT reveals saddle PE with large embolic burden but no evidence of RHS >> to begin heparin .  0901 1439 HL 0.41, therapeutic x1 0901 2016 HL 0.43, therapeutic x2  Goal of Therapy:  Heparin  level 0.3-0.7 units/ml Monitor platelets by anticoagulation protocol: Yes   Plan:  Continue heparin  infusion at 2000 units/hr. Daily CBC, heparin  level. Monitor for signs/symptoms of bleeding.  Alan Hoe,  PharmD 01/19/2024 8:51 PM

## 2024-01-19 NOTE — Progress Notes (Addendum)
 PHARMACY - ANTICOAGULATION CONSULT NOTE  Pharmacy Consult for heparin  Indication: pulmonary embolus  Allergies  Allergen Reactions   Ibuprofen Shortness Of Breath   Nsaids Swelling    Swelling of throat   Fish Oil Nausea Only, Other (See Comments) and Nausea And Vomiting    Acid reflux    Patient Measurements: Height: 5' 9 (175.3 cm) Weight: 110 kg (242 lb 8.1 oz) IBW/kg (Calculated) : 70.7 HEPARIN  DW (KG): 94.9  Vital Signs: Temp: 98.4 F (36.9 C) (09/01 1600) Temp Source: Oral (09/01 1600) BP: 154/89 (09/01 1600) Pulse Rate: 88 (09/01 1600)  Labs: Recent Labs    01/19/24 0132 01/19/24 0336 01/19/24 1029 01/19/24 1439  HGB 14.3  --   --   --   HCT 41.3  --   --   --   PLT 258  --   --   --   APTT  --  26  --   --   LABPROT  --  14.2  --   --   INR  --  1.0  --   --   HEPARINUNFRC  --   --  0.25* 0.41  CREATININE 0.81  --   --   --   TROPONINIHS 55* 66*  --   --     Estimated Creatinine Clearance: 124.4 mL/min (by C-G formula based on SCr of 0.81 mg/dL).   Medical History: Past Medical History:  Diagnosis Date   Benign enlargement of prostate    Bronchitis    Childhood asthma    Diabetes mellitus without complication (HCC)    Environmental allergies    Erectile dysfunction    Headache    Hemorrhoids    Hypercholesteremia    Hypertension    Hypogonadism in male    Lumbago    Over weight     Assessment: 58yo male c/o LLE and back pain since hitting a bump while on his lawnmower, CT reveals saddle PE with large embolic burden but no evidence of RHS >> to begin heparin .  0901 1439 HL 0.41, therapeutic x 1  Goal of Therapy:  Heparin  level 0.3-0.7 units/ml Monitor platelets by anticoagulation protocol: Yes   Plan:  Heparin  infusion therapeutic x 1 Continue heparin  infusion at 2000 units/hr Heparin  level in 6 hours to confirm Continue to monitor H&H and platelets.   Kayla JULIANNA Blew, PharmD, BCPS Clinical Pharmacist 01/19/2024 4:40  PM

## 2024-01-19 NOTE — ED Triage Notes (Signed)
 Pt c/o back pain and left leg pain since hitting a big bump while on lawnmower Friday. Pt states his leg pain is better, but now c/o sob with back pain.

## 2024-01-19 NOTE — Hospital Course (Signed)
 58 y.o. male with medical history significant of  asthma, CAD s/p 3 stents, PAD with stents in the right lower extremity, HTN, HLD, Asthma.  The patient states that on Friday he was outside mowing the lawn and reported that he hit his left leg. He states that he experienced a sharp pain in the lower leg. Once he was inside the house he took tylenol  to help with alleviation of his pain. He states that he continued to have ongoing pain in the left lower extremity on Saturday. However, he thought that it might go away. He states that he drove his daughter home from his house and when he stepped on the gas with his left foot, he had significant pain more than usual. Around 6pm he woke up and used his wheelchair to get to the restroom. He used to the commode and then reports while transferring back to his wheelchair he had significant dyspnea in addition to left back and leg pain. Finally he decided to call EMS and present to the hospital for further evaluation.  CTA chest: . Saddle embolus with the dominant embolic burden within the right pulmonary artery but with additional filling defects within the left upper lobar and segmental pulmonary arteries. The embolic burden is large. No CT evidence of right heart strain. EKG findings sinus tachycardia.

## 2024-01-19 NOTE — Plan of Care (Signed)

## 2024-01-20 ENCOUNTER — Telehealth (HOSPITAL_COMMUNITY): Payer: Self-pay | Admitting: Pharmacy Technician

## 2024-01-20 ENCOUNTER — Telehealth (INDEPENDENT_AMBULATORY_CARE_PROVIDER_SITE_OTHER): Payer: Self-pay

## 2024-01-20 ENCOUNTER — Other Ambulatory Visit (HOSPITAL_COMMUNITY): Payer: Self-pay

## 2024-01-20 DIAGNOSIS — I824Y1 Acute embolism and thrombosis of unspecified deep veins of right proximal lower extremity: Secondary | ICD-10-CM

## 2024-01-20 DIAGNOSIS — I2692 Saddle embolus of pulmonary artery without acute cor pulmonale: Secondary | ICD-10-CM | POA: Diagnosis not present

## 2024-01-20 DIAGNOSIS — E1165 Type 2 diabetes mellitus with hyperglycemia: Secondary | ICD-10-CM | POA: Diagnosis not present

## 2024-01-20 LAB — CBC
HCT: 34.9 % — ABNORMAL LOW (ref 39.0–52.0)
Hemoglobin: 11.9 g/dL — ABNORMAL LOW (ref 13.0–17.0)
MCH: 29.2 pg (ref 26.0–34.0)
MCHC: 34.1 g/dL (ref 30.0–36.0)
MCV: 85.5 fL (ref 80.0–100.0)
Platelets: 198 K/uL (ref 150–400)
RBC: 4.08 MIL/uL — ABNORMAL LOW (ref 4.22–5.81)
RDW: 13.4 % (ref 11.5–15.5)
WBC: 11.9 K/uL — ABNORMAL HIGH (ref 4.0–10.5)
nRBC: 0 % (ref 0.0–0.2)

## 2024-01-20 LAB — HEPARIN LEVEL (UNFRACTIONATED): Heparin Unfractionated: 0.43 [IU]/mL (ref 0.30–0.70)

## 2024-01-20 LAB — HEMOGLOBIN A1C
Hgb A1c MFr Bld: 9.2 % — ABNORMAL HIGH (ref 4.8–5.6)
Mean Plasma Glucose: 217 mg/dL

## 2024-01-20 LAB — GLUCOSE, CAPILLARY
Glucose-Capillary: 177 mg/dL — ABNORMAL HIGH (ref 70–99)
Glucose-Capillary: 217 mg/dL — ABNORMAL HIGH (ref 70–99)
Glucose-Capillary: 159 mg/dL — ABNORMAL HIGH (ref 70–99)
Glucose-Capillary: 151 mg/dL — ABNORMAL HIGH (ref 70–99)

## 2024-01-20 LAB — BASIC METABOLIC PANEL WITH GFR
Anion gap: 10 (ref 5–15)
BUN: 14 mg/dL (ref 6–20)
CO2: 24 mmol/L (ref 22–32)
Calcium: 8.1 mg/dL — ABNORMAL LOW (ref 8.9–10.3)
Chloride: 104 mmol/L (ref 98–111)
Creatinine, Ser: 0.82 mg/dL (ref 0.61–1.24)
GFR, Estimated: >60 mL/min (ref >60)
Glucose, Bld: 168 mg/dL — ABNORMAL HIGH (ref 70–99)
Potassium: 3.6 mmol/L (ref 3.5–5.1)
Sodium: 138 mmol/L (ref 135–145)

## 2024-01-20 LAB — MAGNESIUM: Magnesium: 2 mg/dL (ref 1.7–2.4)

## 2024-01-20 MED ORDER — LISINOPRIL 10 MG PO TABS
20.0000 mg | ORAL_TABLET | Freq: Every day | ORAL | Status: DC
  Administered 2024-01-20: 20 mg via ORAL
  Filled 2024-01-20 (×2): qty 2

## 2024-01-20 MED ORDER — APIXABAN 5 MG PO TABS: 5.0000 mg | ORAL_TABLET | Freq: Two times a day (BID) | ORAL | Status: DC

## 2024-01-20 MED ORDER — APIXABAN 5 MG PO TABS
10.0000 mg | ORAL_TABLET | Freq: Two times a day (BID) | ORAL | Status: DC
  Administered 2024-01-21: 10 mg via ORAL
  Filled 2024-01-20: qty 2

## 2024-01-20 MED ORDER — PANTOPRAZOLE SODIUM 40 MG PO TBEC
40.0000 mg | DELAYED_RELEASE_TABLET | Freq: Two times a day (BID) | ORAL | Status: DC
  Administered 2024-01-20 – 2024-01-21 (×3): 40 mg via ORAL
  Filled 2024-01-20 (×3): qty 1

## 2024-01-20 MED ORDER — GABAPENTIN 300 MG PO CAPS
600.0000 mg | ORAL_CAPSULE | Freq: Three times a day (TID) | ORAL | Status: DC
Start: 2024-01-20 — End: 2024-01-21
  Administered 2024-01-20 – 2024-01-21 (×4): 600 mg via ORAL
  Filled 2024-01-20 (×4): qty 2

## 2024-01-20 MED ORDER — LIVING WELL WITH DIABETES BOOK: Freq: Once | Status: AC

## 2024-01-20 MED ORDER — SALINE SPRAY 0.65 % NA SOLN
1.0000 | NASAL | Status: DC | PRN
  Administered 2024-01-20: 1 via NASAL
  Filled 2024-01-20: qty 44

## 2024-01-20 MED ORDER — FLUTICASONE FUROATE-VILANTEROL 100-25 MCG/ACT IN AEPB
1.0000 | INHALATION_SPRAY | Freq: Every day | RESPIRATORY_TRACT | Status: DC
  Administered 2024-01-21: 1 via RESPIRATORY_TRACT
  Filled 2024-01-20: qty 28

## 2024-01-20 NOTE — Discharge Instructions (Addendum)
 Information on my medicine - ELIQUIS  (apixaban )  This medication education was reviewed with me or my healthcare representative as part of my discharge preparation.    Why was Eliquis  prescribed for you? Eliquis  was prescribed to treat blood clots that may have been found in the veins of your legs (deep vein thrombosis) or in your lungs (pulmonary embolism) and to reduce the risk of them occurring again.  What do You need to know about Eliquis  ? The starting dose is 10 mg (two 5 mg tablets) taken TWICE daily for the FIRST SEVEN (7) DAYS, then on 01/28/2024  the dose is reduced to ONE 5 mg tablet taken TWICE daily.  Eliquis  may be taken with or without food.   Try to take the dose about the same time in the morning and in the evening. If you have difficulty swallowing the tablet whole please discuss with your pharmacist how to take the medication safely.  Take Eliquis  exactly as prescribed and DO NOT stop taking Eliquis  without talking to the doctor who prescribed the medication.  Stopping may increase your risk of developing a new blood clot.  Refill your prescription before you run out.  After discharge, you should have regular check-up appointments with your healthcare provider that is prescribing your Eliquis .    What do you do if you miss a dose? If a dose of ELIQUIS  is not taken at the scheduled time, take it as soon as possible on the same day and twice-daily administration should be resumed. The dose should not be doubled to make up for a missed dose.  Important Safety Information A possible side effect of Eliquis  is bleeding. You should call your healthcare provider right away if you experience any of the following: Bleeding from an injury or your nose that does not stop. Unusual colored urine (red or dark brown) or unusual colored stools (red or black). Unusual bruising for unknown reasons. A serious fall or if you hit your head (even if there is no bleeding).  Some  medicines may interact with Eliquis  and might increase your risk of bleeding or clotting while on Eliquis . To help avoid this, consult your healthcare provider or pharmacist prior to using any new prescription or non-prescription medications, including herbals, vitamins, non-steroidal anti-inflammatory drugs (NSAIDs) and supplements.  This website has more information on Eliquis  (apixaban ): http://www.eliquis .com/eliquis dena

## 2024-01-20 NOTE — Telephone Encounter (Signed)
 Patient spouse left a message stating that her husband has blood clots in the lungs, groin and right below knee amputation. Patient is currently in [facility]. Patient spouse wanted to make the provider aware of the current situation.

## 2024-01-20 NOTE — Telephone Encounter (Signed)

## 2024-01-20 NOTE — Progress Notes (Signed)
 PROGRESS NOTE   Gerald Ellis  FMW:[id number] DOB: Jan 08, 1966 DOA: 01/19/2024 PCP: Valora Agent, MD   Chief Complaint  Patient presents with   Back Pain   Level of care: Med-Surg  Brief Admission History:  58 y.o. male with medical history significant of  asthma, CAD s/p 3 stents, PAD with stents in the right lower extremity, HTN, HLD, Asthma.  The patient states that on Friday he was outside mowing the lawn and reported that he hit his left leg. He states that he experienced a sharp pain in the lower leg. Once he was inside the house he took tylenol  to help with alleviation of his pain. He states that he continued to have ongoing pain in the left lower extremity on Saturday. However, he thought that it might go away. He states that he drove his daughter home from his house and when he stepped on the gas with his left foot, he had significant pain more than usual. Around 6pm he woke up and used his wheelchair to get to the restroom. He used to the commode and then reports while transferring back to his wheelchair he had significant dyspnea in addition to left back and leg pain. Finally he decided to call EMS and present to the hospital for further evaluation.  CTA chest: . Saddle embolus with the dominant embolic burden within the right pulmonary artery but with additional filling defects within the left upper lobar and segmental pulmonary arteries. The embolic burden is large. No CT evidence of right heart strain. EKG findings sinus tachycardia.    Assessment and Plan:  Saddle embolus of pulmonary artery Extensive DVT in the right lower extremity which involves the popliteal, femoral, and common femoral veins Patient has been started on heparin  drip at this time, plan to continue 48 hours given heavy clot burden then transition to apixaban  Echocardiogram: LVEF 60-65% RV size and systolic function normal. Indeterminate diastolic parameters. Case was discussed with IR who stated that if  patient is stable clinically no intervention is planned If the patient starts to decline then they recommend transfer for intervention  Requested benefits check for apixaban  thru pharmacy Discussed with pharm D plan to start apixaban  on 01/21/24   CAD s/p 3 stent placement PAD with stent placement Home meds resumed    DM type 2, uncontrolled Remains on sliding scale insulin   increased to resistant Added prandial coverage and basal coverage: semglee  30 units, novolog  10 units TID with meals, CBG 5x/day CBG (last 3)  Recent Labs    01/19/24 1611 01/19/24 2141 01/20/24 0747  GLUCAP 242* 181* 177*    HLD Patient is on statin therapy   Hypertension Blood pressure is uncontrolled at this time restarted on home medications started on prn medication as well   Asthma continue his home inhalers at this time  GERD with history of ulcers -- pantoprazole  BID ordered for GI protection  DVT prophylaxis: IV heparin  infusion  Code Status: Full  Family Communication:  Disposition: home    Consultants:  Pharm D  Procedures:   Antimicrobials:    Subjective: Pt reports he is still SOB but some improvement in chest discomfort this morning.   Objective: Vitals:   01/20/24 0724 01/20/24 0800 01/20/24 0900 01/20/24 1000  BP:  (!) 171/78 (!) 172/95 (!) 169/74  Pulse:  97 96 92  Resp:   16 20  Temp: 98.5 F (36.9 C)     TempSrc: Oral     SpO2:   95% 92%  Weight:      Height:        Intake/Output Summary (Last 24 hours) at 01/20/2024 1027 Last data filed at 01/20/2024 0748 Gross per 24 hour  Intake 846.09 ml  Output 775 ml  Net 71.09 ml   Filed Weights   01/19/24 0121  Weight: 110 kg   Examination:  General exam: Appears calm and comfortable  Respiratory system: Clear to auscultation. Respiratory effort normal. Cardiovascular system: normal S1 & S2 heard. No JVD, murmurs, rubs, gallops or clicks. No pedal edema. Gastrointestinal system: Abdomen is nondistended, soft and  nontender. No organomegaly or masses felt. Normal bowel sounds heard. Central nervous system: Alert and oriented. No focal neurological deficits. 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 Skin: No rashes, lesions or ulcers. Psychiatry: Judgement and insight appear normal. Mood & affect appropriate.   Data Reviewed: I have personally reviewed following labs and imaging studies  CBC: Recent Labs  Lab 01/19/24 0132 01/20/24 0444  WBC 10.6* 11.9*  HGB 14.3 11.9*  HCT 41.3 34.9*  MCV 82.9 85.5  PLT 258 198    Basic Metabolic Panel: Recent Labs  Lab 01/19/24 0132 01/20/24 0444  NA 135 138  K 3.2* 3.6  CL 97* 104  CO2 22 24  GLUCOSE 316* 168*  BUN 9 14  CREATININE 0.81 0.82  CALCIUM  8.9 8.1*  MG  --  2.0    CBG: Recent Labs  Lab 01/19/24 0751 01/19/24 1114 01/19/24 1611 01/19/24 2141 01/20/24 0747  GLUCAP 228* 225* 242* 181* 177*    Recent Results (from the past 240 hours)  MRSA Next Gen by PCR, Nasal     Status: None   Collection Time: 01/19/24  6:33 AM   Specimen: Nasal Mucosa; Nasal Swab  Result Value Ref Range Status   MRSA by PCR Next Gen NOT DETECTED NOT DETECTED Final    Comment: (NOTE) The GeneXpert MRSA Assay (FDA approved for NASAL specimens only), is one component of a comprehensive MRSA colonization surveillance program. It is not intended to diagnose MRSA infection nor to guide or monitor treatment for MRSA infections. Test performance is not FDA approved in patients less than 18 years old. Performed at [facility], [address] [place], [place] [address]      Radiology Studies: ECHOCARDIOGRAM COMPLETE Result Date: 01/19/2024    ECHOCARDIOGRAM REPORT   Patient Name:   VALERY CHANCE Date of Exam: 01/19/2024 Medical Rec #:  [id number]        Height:       69.0 in Accession #:    [id number]       Weight:       242.5 lb Date of Birth:  11-Feb-1966       BSA:          2.242 m Patient Age:    57 years         BP:           174/92  mmHg Patient Gender: M                HR:           94 bpm. Exam Location:  [facility] Procedure: 2D Echo (Both Spectral and Color Flow Doppler were utilized during            procedure). Indications:    pulmonary embolus  History:        Patient has no prior history of Echocardiogram examinations.  CAD, PAD; Risk Factors:Hypertension, Diabetes and Dyslipidemia.  Sonographer:    Tinnie Barefoot RDCS Referring Phys: [id number] BRADLY POUR STEPHENS  Sonographer Comments: Image acquisition challenging due to patient body habitus. IMPRESSIONS  1. Left ventricular ejection fraction, by estimation, is 60 to 65%. The left ventricle has normal function. The left ventricle has no regional wall motion abnormalities. Left ventricular diastolic parameters are indeterminate.  2. Right ventricular systolic function is normal. The right ventricular size is normal.  3. The mitral valve is normal in structure. No evidence of mitral valve regurgitation. No evidence of mitral stenosis.  4. The aortic valve is tricuspid. Aortic valve regurgitation is not visualized. No aortic stenosis is present.  5. The inferior vena cava is dilated in size with >50% respiratory variability, suggesting right atrial pressure of 8 mmHg. FINDINGS  Left Ventricle: Left ventricular ejection fraction, by estimation, is 60 to 65%. The left ventricle has normal function. The left ventricle has no regional wall motion abnormalities. The left ventricular internal cavity size was normal in size. There is  no left ventricular hypertrophy. Left ventricular diastolic parameters are indeterminate. Right Ventricle: The right ventricular size is normal. No increase in right ventricular wall thickness. Right ventricular systolic function is normal. Left Atrium: Left atrial size was normal in size. Right Atrium: Right atrial size was normal in size. Pericardium: There is no evidence of pericardial effusion. Mitral Valve: The mitral valve is normal in  structure. No evidence of mitral valve regurgitation. No evidence of mitral valve stenosis. Tricuspid Valve: The tricuspid valve is normal in structure. Tricuspid valve regurgitation is not demonstrated. No evidence of tricuspid stenosis. Aortic Valve: The aortic valve is tricuspid. Aortic valve regurgitation is not visualized. No aortic stenosis is present. Pulmonic Valve: The pulmonic valve was normal in structure. Pulmonic valve regurgitation is not visualized. No evidence of pulmonic stenosis. Aorta: The aortic root and ascending aorta are structurally normal, with no evidence of dilitation. Venous: The inferior vena cava is dilated in size with greater than 50% respiratory variability, suggesting right atrial pressure of 8 mmHg. IAS/Shunts: No atrial level shunt detected by color flow Doppler.  LEFT VENTRICLE PLAX 2D LVIDd:         4.60 cm   Diastology LVIDs:         3.20 cm   LV e' medial:    8.38 cm/s LV PW:         1.20 cm   LV E/e' medial:  8.7 LV IVS:        1.20 cm   LV e' lateral:   7.18 cm/s LVOT diam:     2.10 cm   LV E/e' lateral: 10.1 LV SV:         54 LV SV Index:   24 LVOT Area:     3.46 cm  RIGHT VENTRICLE             IVC RV Basal diam:  2.70 cm     IVC diam: 2.50 cm RV S prime:     13.90 cm/s TAPSE (M-mode): 2.4 cm LEFT ATRIUM             Index        RIGHT ATRIUM           Index LA diam:        4.10 cm 1.83 cm/m   RA Area:     18.80 cm LA Vol (A2C):   85.5 ml 38.13 ml/m  RA Volume:   50.40 ml  22.48 ml/m LA Vol (A4C):   54.3 ml 24.22 ml/m LA Biplane Vol: 70.3 ml 31.35 ml/m  AORTIC VALVE LVOT Vmax:   97.60 cm/s LVOT Vmean:  63.200 cm/s LVOT VTI:    0.155 m  AORTA Ao Root diam: 3.30 cm Ao Asc diam:  3.60 cm MV E velocity: 72.50 cm/s MV A velocity: 77.10 cm/s  SHUNTS MV E/A ratio:  0.94        Systemic VTI:  0.16 m                            Systemic Diam: 2.10 cm Mihai Croitoru MD Electronically signed by Jerel Balding MD Signature Date/Time: 01/19/2024/2:59:00 PM    Final    US  Venous Img  Lower Bilateral (DVT) Result Date: 01/19/2024 CLINICAL DATA:  Pulmonary embolus EXAM: BILATERAL LOWER EXTREMITY VENOUS DOPPLER ULTRASOUND TECHNIQUE: Gray-scale sonography with compression, as well as color and duplex ultrasound, were performed to evaluate the deep venous system(s) from the level of the common femoral vein through the popliteal and proximal calf veins. COMPARISON:  CT pulmonary angiogram performed January 19, 2024 FINDINGS: VENOUS Nonocclusive thrombus is apparent in the right common femoral vein. The imaged portion of the right greater saphenous vein is patent. Occlusive thrombus is identified within the right superficial femoral vein in the proximal segment. Nonocclusive thrombus is favored in the mid and distal segments. The popliteal vein is noncompressible as are the gastrocnemius/soleal veins. The patient is status post right lower extremity amputation and therefore the tibial veins were not evaluated. The left common femoral, femoral, and popliteal veins are patent. There is no evidence of DVT within the tibial veins on the left. OTHER None. Limitations: none IMPRESSION: 1. Extensive DVT in the right lower extremity which involves the popliteal, femoral, and common femoral veins. 2. No evidence of left lower extremity DVT. These results will be called to the ordering clinician or representative by the Radiologist Assistant, and communication documented in the PACS or [employer]. Electronically Signed   By: Maude Naegeli M.D.   On: 01/19/2024 12:29   CT Angio Chest PE W and/or Wo Contrast Addendum Date: 01/19/2024 ADDENDUM REPORT: 01/19/2024 03:30 ADDENDUM: These results were called by telephone at the time of interpretation on 01/19/2024 at 3:30 am to provider DONALD WICKLINE , who verbally acknowledged these results. Electronically Signed   By: Dorethia Molt M.D.   On: 01/19/2024 03:30   Result Date: 01/19/2024 CLINICAL DATA:  High probability pulmonary embolism, dyspnea, back pain  EXAM: CT ANGIOGRAPHY CHEST WITH CONTRAST TECHNIQUE: Multidetector CT imaging of the chest was performed using the standard protocol during bolus administration of intravenous contrast. Multiplanar CT image reconstructions and MIPs were obtained to evaluate the vascular anatomy. RADIATION DOSE REDUCTION: This exam was performed according to the departmental dose-optimization program which includes automated exposure control, adjustment of the mA and/or kV according to patient size and/or use of iterative reconstruction technique. CONTRAST:  75mL OMNIPAQUE  IOHEXOL  350 MG/ML SOLN COMPARISON:  03/22/2021 FINDINGS: Cardiovascular: There is adequate opacification of the pulmonary arterial tree. There is a saddle embolus present with the dominant embolic burden within the right pulmonary artery but with additional filling defect seen within the left upper lobar and segmental pulmonary arteries. The embolic burden is large. The central pulmonary arteries are dilated in keeping with changes of pulmonary arterial hypertension. Global cardiac size, however, is within normal limits there is no CT evidence of right heart strain. Extensive multi-vessel  coronary artery calcification. No pericardial effusion. The thoracic aorta is unremarkable. Mediastinum/Nodes: No enlarged mediastinal, hilar, or axillary lymph nodes. Thyroid gland, trachea, and esophagus demonstrate no significant findings. Lungs/Pleura: Lungs are clear. No pleural effusion or pneumothorax. Upper Abdomen: No acute abnormality. Musculoskeletal: No acute bone abnormality. No lytic or blastic bone lesion. Osseous structures are age appropriate. Review of the MIP images confirms the above findings. IMPRESSION: 1. Saddle embolus with the dominant embolic burden within the right pulmonary artery but with additional filling defects within the left upper lobar and segmental pulmonary arteries. The embolic burden is large. No CT evidence of right heart strain. 2.  Morphologic changes in keeping with pulmonary arterial hypertension. 3. Extensive multi-vessel coronary artery calcification. Electronically Signed: By: Dorethia Molt M.D. On: 01/19/2024 03:14   DG Chest Portable 1 View Result Date: 01/19/2024 CLINICAL DATA:  Dyspnea EXAM: PORTABLE CHEST 1 VIEW COMPARISON:  None Available. FINDINGS: The heart size and mediastinal contours are within normal limits. Both lungs are clear. The visualized skeletal structures are unremarkable. IMPRESSION: No active disease. Electronically Signed   By: Dorethia Molt M.D.   On: 01/19/2024 03:03   Scheduled Meds:  amLODipine   5 mg Oral Daily   [START ON 01/21/2024] apixaban   10 mg Oral BID   Followed by   NOREEN ON 01/28/2024] apixaban   5 mg Oral BID   atorvastatin   80 mg Oral q1800   Chlorhexidine  Gluconate Cloth  6 each Topical Daily   docusate sodium   100 mg Oral BID   insulin  aspart  0-20 Units Subcutaneous TID WC   insulin  aspart  0-5 Units Subcutaneous QHS   insulin  aspart  10 Units Subcutaneous TID WC   insulin  glargine-yfgn  30 Units Subcutaneous Daily   isosorbide  mononitrate  30 mg Oral Daily   loratadine   10 mg Oral Daily   metoprolol  succinate  50 mg Oral Daily   montelukast   10 mg Oral QHS   pantoprazole   40 mg Oral BID   prasugrel   10 mg Oral Daily   senna  1 tablet Oral BID   Continuous Infusions:  heparin  2,000 Units/hr (01/20/24 0745)    LOS: 1 day   Time spent: 56 mins  Wendell Fiebig Vicci, MD How to contact the [facility] Attending or Consulting provider 7A - 7P or covering provider during after hours 7P -7A, for this patient?  Check the care team in [facility] and look for a) attending/consulting TRH provider listed and b) the TRH team listed Log into [email or web address] to find provider on call.  Locate the TRH provider you are looking for under [facility] and page to a number that you can be directly reached. If you still have difficulty reaching the provider, please page the [facility] (Director on Call)  for the Hospitalists listed on amion for assistance.  01/20/2024, 10:27 AM

## 2024-01-20 NOTE — Progress Notes (Addendum)
 PHARMACY - ANTICOAGULATION CONSULT NOTE  Pharmacy Consult for heparin  Indication: pulmonary embolus  Allergies  Allergen Reactions   Ibuprofen Shortness Of Breath   Nsaids Swelling    Swelling of throat   Fish Oil Nausea And Vomiting, Nausea Only and Other (See Comments)    Acid reflux    Patient Measurements: Height: 5' 9 (175.3 cm) Weight: 110 kg (242 lb 8.1 oz) IBW/kg (Calculated) : 70.7 HEPARIN  DW (KG): 94.9  Vital Signs: Temp: 98.5 F (36.9 C) (09/02 0724) Temp Source: Oral (09/02 0724) BP: 146/77 (09/02 0700) Pulse Rate: 87 (09/02 0700)  Labs: Recent Labs    01/19/24 0132 01/19/24 0336 01/19/24 1029 01/19/24 1439 01/19/24 2016 01/20/24 0444  HGB 14.3  --   --   --   --  11.9*  HCT 41.3  --   --   --   --  34.9*  PLT 258  --   --   --   --  198  APTT  --  26  --   --   --   --   LABPROT  --  14.2  --   --   --   --   INR  --  1.0  --   --   --   --   HEPARINUNFRC  --   --    < > 0.41 0.43 0.43  CREATININE 0.81  --   --   --   --  0.82  TROPONINIHS 55* 66*  --   --   --   --    < > = values in this interval not displayed.    Estimated Creatinine Clearance: 122.9 mL/min (by C-G formula based on SCr of 0.82 mg/dL).   Medical History: Past Medical History:  Diagnosis Date   Benign enlargement of prostate    Bronchitis    Childhood asthma    Diabetes mellitus without complication (HCC)    Environmental allergies    Erectile dysfunction    Headache    Hemorrhoids    Hypercholesteremia    Hypertension    Hypogonadism in male    Lumbago    Over weight     Assessment: 58yo male c/o LLE and back pain since hitting a bump while on his lawnmower, CT reveals saddle PE with large embolic burden but no evidence of RHS >> to begin heparin .  HL 0.43- therapeutic CBC WNL  Goal of Therapy:  Heparin  level 0.3-0.7 units/ml Monitor platelets by anticoagulation protocol: Yes   Plan:  Continue heparin  infusion at 2000 units/hr Heparin  level daily Plan  is for 48 hours of IV heparin  and then transition to Eliquis  on 9/3 Continue to monitor H&H and platelets.   Elspeth Sour, PharmD Clinical Pharmacist 01/20/2024 8:01 AM

## 2024-01-20 NOTE — Progress Notes (Signed)
 Patient with court date on 9/3 in [place]. Patient agreeable to paperwork being faxed to [facility] Attorney's office of [place]. Fax number: [phone]

## 2024-01-21 ENCOUNTER — Ambulatory Visit: Admitting: Physical Therapy

## 2024-01-21 DIAGNOSIS — J9601 Acute respiratory failure with hypoxia: Secondary | ICD-10-CM

## 2024-01-21 DIAGNOSIS — I2602 Saddle embolus of pulmonary artery with acute cor pulmonale: Secondary | ICD-10-CM

## 2024-01-21 DIAGNOSIS — I2692 Saddle embolus of pulmonary artery without acute cor pulmonale: Secondary | ICD-10-CM | POA: Diagnosis not present

## 2024-01-21 DIAGNOSIS — E1165 Type 2 diabetes mellitus with hyperglycemia: Secondary | ICD-10-CM | POA: Diagnosis not present

## 2024-01-21 LAB — BASIC METABOLIC PANEL WITH GFR
Anion gap: 9 (ref 5–15)
BUN: 23 mg/dL — ABNORMAL HIGH (ref 6–20)
CO2: 23 mmol/L (ref 22–32)
Calcium: 8 mg/dL — ABNORMAL LOW (ref 8.9–10.3)
Chloride: 103 mmol/L (ref 98–111)
Creatinine, Ser: 1.18 mg/dL (ref 0.61–1.24)
GFR, Estimated: >60 mL/min (ref >60)
Glucose, Bld: 154 mg/dL — ABNORMAL HIGH (ref 70–99)
Potassium: 3.5 mmol/L (ref 3.5–5.1)
Sodium: 135 mmol/L (ref 135–145)

## 2024-01-21 LAB — CBC
HCT: 34 % — ABNORMAL LOW (ref 39.0–52.0)
Hemoglobin: 11.4 g/dL — ABNORMAL LOW (ref 13.0–17.0)
MCH: 29.2 pg (ref 26.0–34.0)
MCHC: 33.5 g/dL (ref 30.0–36.0)
MCV: 87 fL (ref 80.0–100.0)
Platelets: 223 K/uL (ref 150–400)
RBC: 3.91 MIL/uL — ABNORMAL LOW (ref 4.22–5.81)
RDW: 13.6 % (ref 11.5–15.5)
WBC: 11.5 K/uL — ABNORMAL HIGH (ref 4.0–10.5)
nRBC: 0 % (ref 0.0–0.2)

## 2024-01-21 LAB — GLUCOSE, CAPILLARY
Glucose-Capillary: 162 mg/dL — ABNORMAL HIGH (ref 70–99)
Glucose-Capillary: 151 mg/dL — ABNORMAL HIGH (ref 70–99)

## 2024-01-21 LAB — MAGNESIUM: Magnesium: 2 mg/dL (ref 1.7–2.4)

## 2024-01-21 LAB — HEPARIN LEVEL (UNFRACTIONATED): Heparin Unfractionated: 0.28 [IU]/mL — ABNORMAL LOW (ref 0.30–0.70)

## 2024-01-21 MED ORDER — ALBUTEROL SULFATE HFA 108 (90 BASE) MCG/ACT IN AERS: 1.0000 | INHALATION_SPRAY | Freq: Four times a day (QID) | RESPIRATORY_TRACT | 1 refills | Status: DC | PRN

## 2024-01-21 MED ORDER — BUDESONIDE-FORMOTEROL FUMARATE 80-4.5 MCG/ACT IN AERO: 2.0000 | INHALATION_SPRAY | Freq: Every day | RESPIRATORY_TRACT | 1 refills | Status: DC | PRN

## 2024-01-21 MED ORDER — METOPROLOL SUCCINATE ER 50 MG PO TB24: 50.0000 mg | ORAL_TABLET | Freq: Every day | ORAL | 1 refills | Status: AC

## 2024-01-21 MED ORDER — TRESIBA FLEXTOUCH 200 UNIT/ML ~~LOC~~ SOPN
90.0000 [IU] | PEN_INJECTOR | Freq: Every day | SUBCUTANEOUS | Status: AC
Start: 2024-01-21 — End: ?

## 2024-01-21 MED ORDER — APIXABAN 5 MG PO TABS: ORAL_TABLET | ORAL | 2 refills | Status: AC

## 2024-01-21 NOTE — Progress Notes (Signed)
 SATURATION QUALIFICATIONS: (This note is used to comply with regulatory documentation for home oxygen)   Patient Saturations on Room Air at Rest = 87   Patient Saturations on Room Air while Ambulating = N/A   Patient Saturations on 2 Liters of oxygen while Ambulating = 96%   Please briefly explain why patient needs home oxygen: To maintain 02 sat at 90% or above during ambulation.   Catarina Hartshorn, DO

## 2024-01-21 NOTE — TOC Transition Note (Signed)
 Transition of Care [facility]) - Discharge Note   Patient Details  Name: Gerald Ellis MRN: [id number] Date of Birth: 1965-06-27  [facility] CM/SW Contact:  Mcarthur Saddie Kim, LCSW Phone Number: 01/21/2024, 10:39 AM   Clinical Narrative:  Pt will need home O2. LCSW discussed with pt who has no preference on agency. Referred to Zach with [employer]. Order/sat qualification note in. Pt aware portable O2 tank will be delivered to room. RN updated.      Final next level of care: Home/Self Care Barriers to Discharge: Barriers Resolved   Patient Goals and CMS Choice            Discharge Placement                       Discharge Plan and Services Additional resources added to the After Visit Summary for                  DME Arranged: Oxygen  DME Agency: [employer] Date DME Agency Contacted: 01/21/24 Time DME Agency Contacted: 1039 Representative spoke with at DME Agency: Darlyn            Social Drivers of Health (SDOH) Interventions SDOH Screenings   Food Insecurity: No Food Insecurity (01/19/2024)  Housing: Low Risk  (01/19/2024)  Transportation Needs: No Transportation Needs (01/19/2024)  Utilities: Not At Risk (01/19/2024)  Financial Resource Strain: High Risk (07/03/2023)   Received from [facility]  Physical Activity: Insufficiently Active (04/20/2019)  Social Connections: Moderately Isolated (04/20/2019)  Stress: No Stress Concern Present (04/20/2019)  Tobacco Use: Low Risk  (01/19/2024)     Readmission Risk Interventions     No data to display

## 2024-01-21 NOTE — Progress Notes (Signed)
 Mobility Specialist Progress Note:    01/21/24 1230  Mobility  Activity Pivoted/transferred from bed to chair  Level of Assistance Standby assist, set-up cues, supervision of patient - no hands on  Assistive Device None (arm of wheelchair)  Distance Ambulated (ft) 2 ft  Range of Motion/Exercises Active;Right arm;Left arm;Left leg  Activity Response Tolerated well  Mobility Referral Yes  Mobility visit 1 Mobility  Mobility Specialist Start Time (ACUTE ONLY) 1230  Mobility Specialist Stop Time (ACUTE ONLY) 1245  Mobility Specialist Time Calculation (min) (ACUTE ONLY) 15 min   Pt received in bed, NT requesting help transferring to wheelchair for d/c. Required SBA to stand and pivot using arm of wheelchair for support. Left pt in car with wife, all needs met.  Isaiha Asare Mobility Specialist Please contact via Special educational needs teacher or  Rehab office at [phone]

## 2024-01-21 NOTE — Progress Notes (Signed)
 PHARMACY - ANTICOAGULATION CONSULT NOTE  Pharmacy Consult for heparin  Indication: pulmonary embolus  Allergies  Allergen Reactions   Ibuprofen Shortness Of Breath   Nsaids Swelling    Swelling of throat   Fish Oil Nausea And Vomiting, Nausea Only and Other (See Comments)    Acid reflux    Patient Measurements: Height: 5' 9 (175.3 cm) Weight: 110 kg (242 lb 8.1 oz) IBW/kg (Calculated) : 70.7 HEPARIN  DW (KG): 94.9  Vital Signs: Temp: 97.6 F (36.4 C) (09/03 0530) Temp Source: Oral (09/03 0530) BP: 121/67 (09/03 0530) Pulse Rate: 91 (09/03 0530)  Labs: Recent Labs    01/19/24 0132 01/19/24 0336 01/19/24 1029 01/19/24 2016 01/20/24 0444 01/21/24 0405  HGB 14.3  --   --   --  11.9* 11.4*  HCT 41.3  --   --   --  34.9* 34.0*  PLT 258  --   --   --  198 223  APTT  --  26  --   --   --   --   LABPROT  --  14.2  --   --   --   --   INR  --  1.0  --   --   --   --   HEPARINUNFRC  --   --    < > 0.43 0.43 0.28*  CREATININE 0.81  --   --   --  0.82 1.18  TROPONINIHS 55* 66*  --   --   --   --    < > = values in this interval not displayed.    Estimated Creatinine Clearance: 85.4 mL/min (by C-G formula based on SCr of 1.18 mg/dL).   Medical History: Past Medical History:  Diagnosis Date   Benign enlargement of prostate    Bronchitis    Childhood asthma    Diabetes mellitus without complication (HCC)    Environmental allergies    Erectile dysfunction    Headache    Hemorrhoids    Hypercholesteremia    Hypertension    Hypogonadism in male    Lumbago    Over weight     Assessment: 58yo male c/o LLE and back pain since hitting a bump while on his lawnmower, CT reveals saddle PE with large embolic burden but no evidence of RHS >> to begin heparin .  Heparin  level just below at 0.28. CBC stable. No bleeding issues noted. Orders already entered to transition to apixaban  this morning.   Goal of Therapy:  Heparin  level 0.3-0.7 units/ml Monitor platelets by  anticoagulation protocol: Yes   Plan:  Start apixaban  this morning.   Dempsey Blush PharmD., BCPS Clinical Pharmacist 01/21/2024 8:31 AM

## 2024-01-21 NOTE — Plan of Care (Signed)
  Problem: Education: Goal: Knowledge of General Education information will improve Description: Including pain rating scale, medication(s)/side effects and non-pharmacologic comfort measures Outcome: Progressing   Problem: Health Behavior/Discharge Planning: Goal: Ability to manage health-related needs will improve Outcome: Progressing   Problem: Clinical Measurements: Goal: Ability to maintain clinical measurements within normal limits will improve Outcome: Progressing Goal: Will remain free from infection Outcome: Progressing Goal: Diagnostic test results will improve Outcome: Progressing Goal: Respiratory complications will improve Outcome: Progressing Goal: Cardiovascular complication will be avoided Outcome: Progressing   Problem: Activity: Goal: Risk for activity intolerance will decrease Outcome: Progressing   Problem: Nutrition: Goal: Adequate nutrition will be maintained Outcome: Progressing   Problem: Coping: Goal: Level of anxiety will decrease Outcome: Progressing   Problem: Elimination: Goal: Will not experience complications related to bowel motility Outcome: Progressing Goal: Will not experience complications related to urinary retention Outcome: Progressing   Problem: Pain Managment: Goal: General experience of comfort will improve and/or be controlled Outcome: Progressing   Problem: Skin Integrity: Goal: Risk for impaired skin integrity will decrease Outcome: Progressing   Problem: Education: Goal: Ability to describe self-care measures that may prevent or decrease complications (Diabetes Survival Skills Education) will improve Outcome: Progressing Goal: Individualized Educational Video(s) Outcome: Progressing   Problem: Coping: Goal: Ability to adjust to condition or change in health will improve Outcome: Progressing   Problem: Fluid Volume: Goal: Ability to maintain a balanced intake and output will improve Outcome: Progressing    Problem: Health Behavior/Discharge Planning: Goal: Ability to identify and utilize available resources and services will improve Outcome: Progressing Goal: Ability to manage health-related needs will improve Outcome: Progressing   Problem: Metabolic: Goal: Ability to maintain appropriate glucose levels will improve Outcome: Progressing   Problem: Nutritional: Goal: Maintenance of adequate nutrition will improve Outcome: Progressing Goal: Progress toward achieving an optimal weight will improve Outcome: Progressing   Problem: Skin Integrity: Goal: Risk for impaired skin integrity will decrease Outcome: Progressing   Problem: Tissue Perfusion: Goal: Adequacy of tissue perfusion will improve Outcome: Progressing

## 2024-01-21 NOTE — Discharge Summary (Signed)
 Physician Discharge Summary   Patient: Gerald Ellis MRN: [id number] DOB: [birth year]  Admit date:     01/19/2024  Discharge date: 01/21/24  Discharge Physician: Alm Maddelynn Moosman   PCP: Valora Agent, MD   Recommendations at discharge:   Please follow up with primary care provider within 1-2 weeks  Please repeat BMP and CBC in one week      Hospital Course: 58 y.o. male with medical history significant of  asthma, CAD s/p 3 stents, PAD with stents in the right lower extremity, HTN, HLD, Asthma.  The patient states that on Friday he was outside mowing the lawn and reported that he hit his left leg. He states that he experienced a sharp pain in the lower leg. Once he was inside the house he took tylenol  to help with alleviation of his pain. He states that he continued to have ongoing pain in the left lower extremity on Saturday. However, he thought that it might go away. He states that he drove his daughter home from his house and when he stepped on the gas with his left foot, he had significant pain more than usual. Around 6pm he woke up and used his wheelchair to get to the restroom. He used to the commode and then reports while transferring back to his wheelchair he had significant dyspnea in addition to left back and leg pain. Finally he decided to call EMS and present to the hospital for further evaluation.  CTA chest: . Saddle embolus with the dominant embolic burden within the right pulmonary artery but with additional filling defects within the left upper lobar and segmental pulmonary arteries. The embolic burden is large. No CT evidence of right heart strain. EKG findings sinus tachycardia.     Assessment and Plan:  Saddle embolus of pulmonary artery Extensive DVT in the right lower extremity which involves the popliteal, femoral, and common femoral veins Patient has been started on heparin  drip at this time -pt received >48 hours of IV heparin  -9/1 Echocardiogram: LVEF 60-65% RV size  and systolic function normal. Indeterminate diastolic parameters. -Case was discussed with IR who stated that if patient is stable clinically no intervention is planned -If the patient starts to decline then they recommend transfer for intervention  -pt remained hemodynamically stable -he continued to improve clinically  Acute respiratory failure with hypoxia -presented with dyspnea and saturation 82% on RA -stable on 2L -no increase WOB -d/c hom with 2L Gravity   CAD s/p 3 stent placement PAD with stent placement -Home meds resumed --imdur  and ASA 81 mg  -no chest pain   DM type 2, uncontrolled with hyperglycemia Remains on sliding scale insulin   increased to resistant -Added prandial coverage and basal coverage: semglee  30 units, novolog  10 units TID with meals, CBG 5x/day -01/19/24 A1C--9.2  HLD Patient is on statin therapy   Hypertension Blood pressure remains elevated with SBP in 150-160 restarted on home medications   Asthma continue his home inhalers at this time   GERD with history of ulcers -- pantoprazole  BID ordered for GI protection       Consultants: none Procedures performed: none  Disposition: Home Diet recommendation:  Cardiac and Carb modified diet DISCHARGE MEDICATION: Allergies as of 01/21/2024       Reactions   Ibuprofen Shortness Of Breath   Nsaids Swelling   Swelling of throat   Fish Oil Nausea And Vomiting, Nausea Only, Other (See Comments)   Acid reflux        Medication List  STOP taking these medications    amLODipine  5 MG tablet Commonly known as: NORVASC    losartan  50 MG tablet Commonly known as: COZAAR        TAKE these medications    albuterol  108 (90 Base) MCG/ACT inhaler Commonly known as: VENTOLIN  HFA Inhale 1-2 puffs into the lungs every 6 (six) hours as needed for wheezing or shortness of breath.   apixaban  5 MG Tabs tablet Commonly known as: ELIQUIS  Take 2 tablets (10 mg total) by mouth 2 (two) times daily for  7 days, THEN 1 tablet (5 mg total) 2 (two) times daily. Start taking on: January 21, 2024   aspirin  81 MG chewable tablet Chew 1 tablet (81 mg total) by mouth daily.   atorvastatin  80 MG tablet Commonly known as: Lipitor  Take 1 tablet (80 mg total) by mouth daily at 6 PM.   budesonide -formoterol  80-4.5 MCG/ACT inhaler Commonly known as: SYMBICORT  Inhale 2 puffs into the lungs daily as needed (SOB).   cetirizine 10 MG tablet Commonly known as: ZYRTEC Take 10 mg by mouth daily.   fluticasone  50 MCG/ACT nasal spray Commonly known as: FLONASE  Place 1 spray into both nostrils 2 (two) times daily as needed (nasal congestion).   FreeStyle Libre 2 Sensor Misc USE 1 KIT EVERY 14 (FOURTEEN) DAYS FOR GLUCOSE MONITORING   gabapentin  600 MG tablet Commonly known as: NEURONTIN  Take 600 mg by mouth 3 (three) times daily.   HumaLOG KwikPen 200 UNIT/ML KwikPen Generic drug: insulin  lispro Inject 50 Units into the skin with breakfast, with lunch, and with evening meal.   isosorbide  mononitrate 30 MG 24 hr tablet Commonly known as: IMDUR  Take 30 mg by mouth daily.   lisinopril  20 MG tablet Commonly known as: ZESTRIL  Take 20 mg by mouth daily.   metFORMIN 1000 MG tablet Commonly known as: GLUCOPHAGE Take 1,000 mg by mouth 2 (two) times daily.   metoprolol  succinate 50 MG 24 hr tablet Commonly known as: TOPROL -XL Take 1 tablet (50 mg total) by mouth daily. Take with or immediately following a meal. Start taking on: January 22, 2024   naloxone 4 MG/0.1ML Liqd nasal spray kit Commonly known as: NARCAN Place 0.4 mg into the nose once.   oxyCODONE -acetaminophen  10-325 MG tablet Commonly known as: PERCOCET Take 1 tablet by mouth every 6 (six) hours as needed for pain.   pantoprazole  40 MG tablet Commonly known as: Protonix  Take 1 tablet (40 mg total) by mouth daily.   pioglitazone  15 MG tablet Commonly known as: ACTOS  Take 15 mg by mouth 2 (two) times daily.   tadalafil 20  MG tablet Commonly known as: CIALIS Take 20 mg by mouth daily as needed for erectile dysfunction.   Tresiba  FlexTouch 200 UNIT/ML FlexTouch Pen Generic drug: insulin  degludec Inject 90 Units into the skin daily at 10 pm. What changed: how much to take               Durable Medical Equipment  (From admission, onward)           Start     Ordered   01/21/24 1017  For home use only DME oxygen   Once       Comments: POC for Asthma and PE  Question Answer Comment  Length of Need 6 Months   Mode or (Route) Nasal cannula   Liters per Minute 2   Frequency Continuous (stationary and portable oxygen  unit needed)   Oxygen  conserving device Yes   Oxygen  delivery system Gas  01/21/24 1016            Discharge Exam: Filed Weights   01/19/24 0121  Weight: 110 kg   HEENT:  [place]/AT, No thrush, no icterus CV:  RRR, no rub, no S3, no S4 Lung:  CTA, no wheeze, no rhonchi Abd:  soft/+BS, NT Ext:  No edema, no lymphangitis, no synovitis, no rash; 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   Imaging Studies: ECHOCARDIOGRAM COMPLETE Result Date: 01/19/2024    ECHOCARDIOGRAM REPORT   Patient Name:   Gerald Ellis Date of Exam: 01/19/2024 Medical Rec #:  [id number]        Height:       69.0 in Accession #:    [id number]       Weight:       242.5 lb Date of Birth:  1965-07-28       BSA:          2.242 m Patient Age:    57 years         BP:           174/92 mmHg Patient Gender: M                HR:           94 bpm. Exam Location:  [facility] Procedure: 2D Echo (Both Spectral and Color Flow Doppler were utilized during            procedure). Indications:    pulmonary embolus  History:        Patient has no prior history of Echocardiogram examinations.                 CAD, PAD; Risk  Factors:Hypertension, Diabetes and Dyslipidemia.  Sonographer:    Tinnie Barefoot RDCS Referring Phys: [id number] BRADLY POUR STEPHENS  Sonographer Comments: Image acquisition challenging due to patient body habitus. IMPRESSIONS  1. Left ventricular ejection fraction, by estimation, is 60 to 65%. The left ventricle has normal function. The left ventricle has no regional wall motion abnormalities. Left ventricular diastolic parameters are indeterminate.  2. Right ventricular systolic function is normal. The right ventricular size is normal.  3. The mitral valve is normal in structure. No evidence of mitral valve regurgitation. No evidence of mitral stenosis.  4. The aortic valve is tricuspid. Aortic valve regurgitation is not visualized. No aortic stenosis is present.  5. The inferior vena cava is dilated in size with >50% respiratory variability, suggesting right atrial pressure of 8 mmHg. FINDINGS  Left Ventricle: Left ventricular ejection fraction, by estimation, is 60 to 65%. The left ventricle has normal function. The left ventricle has no regional wall motion abnormalities. The left ventricular internal cavity size was normal in size. There is  no left ventricular hypertrophy. Left ventricular diastolic parameters are indeterminate. Right Ventricle: The right ventricular size is normal. No increase in right ventricular wall thickness. Right ventricular systolic function is normal. Left Atrium: Left atrial size was normal in size. Right Atrium: Right atrial size was normal in size. Pericardium: There is no evidence of pericardial effusion. Mitral Valve: The mitral valve is normal in structure. No evidence of mitral valve regurgitation. No evidence of mitral valve stenosis. Tricuspid Valve: The tricuspid valve is normal in structure. Tricuspid valve regurgitation is not demonstrated. No evidence of tricuspid stenosis. Aortic Valve: The aortic valve  is tricuspid. Aortic valve regurgitation is not visualized. No aortic  stenosis is present. Pulmonic Valve: The pulmonic valve was normal in structure. Pulmonic valve regurgitation is not visualized. No evidence of pulmonic stenosis. Aorta: The aortic root and ascending aorta are structurally normal, with no evidence of dilitation. Venous: The inferior vena cava is dilated in size with greater than 50% respiratory variability, suggesting right atrial pressure of 8 mmHg. IAS/Shunts: No atrial level shunt detected by color flow Doppler.  LEFT VENTRICLE PLAX 2D LVIDd:         4.60 cm   Diastology LVIDs:         3.20 cm   LV e' medial:    8.38 cm/s LV PW:         1.20 cm   LV E/e' medial:  8.7 LV IVS:        1.20 cm   LV e' lateral:   7.18 cm/s LVOT diam:     2.10 cm   LV E/e' lateral: 10.1 LV SV:         54 LV SV Index:   24 LVOT Area:     3.46 cm  RIGHT VENTRICLE             IVC RV Basal diam:  2.70 cm     IVC diam: 2.50 cm RV S prime:     13.90 cm/s TAPSE (M-mode): 2.4 cm LEFT ATRIUM             Index        RIGHT ATRIUM           Index LA diam:        4.10 cm 1.83 cm/m   RA Area:     18.80 cm LA Vol (A2C):   85.5 ml 38.13 ml/m  RA Volume:   50.40 ml  22.48 ml/m LA Vol (A4C):   54.3 ml 24.22 ml/m LA Biplane Vol: 70.3 ml 31.35 ml/m  AORTIC VALVE LVOT Vmax:   97.60 cm/s LVOT Vmean:  63.200 cm/s LVOT VTI:    0.155 m  AORTA Ao Root diam: 3.30 cm Ao Asc diam:  3.60 cm MV E velocity: 72.50 cm/s MV A velocity: 77.10 cm/s  SHUNTS MV E/A ratio:  0.94        Systemic VTI:  0.16 m                            Systemic Diam: 2.10 cm Mihai Croitoru MD Electronically signed by Jerel Balding MD Signature Date/Time: 01/19/2024/2:59:00 PM    Final    US  Venous Img Lower Bilateral (DVT) Result Date: 01/19/2024 CLINICAL DATA:  Pulmonary embolus EXAM: BILATERAL LOWER EXTREMITY VENOUS DOPPLER ULTRASOUND TECHNIQUE: Gray-scale sonography with compression, as well as color and duplex ultrasound, were performed to evaluate the deep venous system(s) from the level of the common femoral vein through the  popliteal and proximal calf veins. COMPARISON:  CT pulmonary angiogram performed January 19, 2024 FINDINGS: VENOUS Nonocclusive thrombus is apparent in the right common femoral vein. The imaged portion of the right greater saphenous vein is patent. Occlusive thrombus is identified within the right superficial femoral vein in the proximal segment. Nonocclusive thrombus is favored in the mid and distal segments. The popliteal vein is noncompressible as are the gastrocnemius/soleal veins. The patient is status post right lower extremity amputation and therefore the tibial veins were not evaluated. The left common femoral, femoral, and popliteal veins are patent. There  is no evidence of DVT within the tibial veins on the left. OTHER None. Limitations: none IMPRESSION: 1. Extensive DVT in the right lower extremity which involves the popliteal, femoral, and common femoral veins. 2. No evidence of left lower extremity DVT. These results will be called to the ordering clinician or representative by the Radiologist Assistant, and communication documented in the PACS or [employer]. Electronically Signed   By: Maude Naegeli M.D.   On: 01/19/2024 12:29   CT Angio Chest PE W and/or Wo Contrast Addendum Date: 01/19/2024 ADDENDUM REPORT: 01/19/2024 03:30 ADDENDUM: These results were called by telephone at the time of interpretation on 01/19/2024 at 3:30 am to provider DONALD WICKLINE , who verbally acknowledged these results. Electronically Signed   By: Dorethia Molt M.D.   On: 01/19/2024 03:30   Result Date: 01/19/2024 CLINICAL DATA:  High probability pulmonary embolism, dyspnea, back pain EXAM: CT ANGIOGRAPHY CHEST WITH CONTRAST TECHNIQUE: Multidetector CT imaging of the chest was performed using the standard protocol during bolus administration of intravenous contrast. Multiplanar CT image reconstructions and MIPs were obtained to evaluate the vascular anatomy. RADIATION DOSE REDUCTION: This exam was performed according  to the departmental dose-optimization program which includes automated exposure control, adjustment of the mA and/or kV according to patient size and/or use of iterative reconstruction technique. CONTRAST:  75mL OMNIPAQUE  IOHEXOL  350 MG/ML SOLN COMPARISON:  03/22/2021 FINDINGS: Cardiovascular: There is adequate opacification of the pulmonary arterial tree. There is a saddle embolus present with the dominant embolic burden within the right pulmonary artery but with additional filling defect seen within the left upper lobar and segmental pulmonary arteries. The embolic burden is large. The central pulmonary arteries are dilated in keeping with changes of pulmonary arterial hypertension. Global cardiac size, however, is within normal limits there is no CT evidence of right heart strain. Extensive multi-vessel coronary artery calcification. No pericardial effusion. The thoracic aorta is unremarkable. Mediastinum/Nodes: No enlarged mediastinal, hilar, or axillary lymph nodes. Thyroid gland, trachea, and esophagus demonstrate no significant findings. Lungs/Pleura: Lungs are clear. No pleural effusion or pneumothorax. Upper Abdomen: No acute abnormality. Musculoskeletal: No acute bone abnormality. No lytic or blastic bone lesion. Osseous structures are age appropriate. Review of the MIP images confirms the above findings. IMPRESSION: 1. Saddle embolus with the dominant embolic burden within the right pulmonary artery but with additional filling defects within the left upper lobar and segmental pulmonary arteries. The embolic burden is large. No CT evidence of right heart strain. 2. Morphologic changes in keeping with pulmonary arterial hypertension. 3. Extensive multi-vessel coronary artery calcification. Electronically Signed: By: Dorethia Molt M.D. On: 01/19/2024 03:14   DG Chest Portable 1 View Result Date: 01/19/2024 CLINICAL DATA:  Dyspnea EXAM: PORTABLE CHEST 1 VIEW COMPARISON:  None Available. FINDINGS: The heart  size and mediastinal contours are within normal limits. Both lungs are clear. The visualized skeletal structures are unremarkable. IMPRESSION: No active disease. Electronically Signed   By: Dorethia Molt M.D.   On: 01/19/2024 03:03    Microbiology: Results for orders placed or performed during the hospital encounter of 01/19/24  MRSA Next Gen by PCR, Nasal     Status: None   Collection Time: 01/19/24  6:33 AM   Specimen: Nasal Mucosa; Nasal Swab  Result Value Ref Range Status   MRSA by PCR Next Gen NOT DETECTED NOT DETECTED Final    Comment: (NOTE) The GeneXpert MRSA Assay (FDA approved for NASAL specimens only), is one component of a comprehensive MRSA colonization surveillance program.  It is not intended to diagnose MRSA infection nor to guide or monitor treatment for MRSA infections. Test performance is not FDA approved in patients less than 75 years old. Performed at [facility], [address], [place], [place] [address]     Labs: CBC: Recent Labs  Lab 01/19/24 0132 01/20/24 0444 01/21/24 0405  WBC 10.6* 11.9* 11.5*  HGB 14.3 11.9* 11.4*  HCT 41.3 34.9* 34.0*  MCV 82.9 85.5 87.0  PLT 258 198 223   Basic Metabolic Panel: Recent Labs  Lab 01/19/24 0132 01/20/24 0444 01/21/24 0405  NA 135 138 135  K 3.2* 3.6 3.5  CL 97* 104 103  CO2 22 24 23   GLUCOSE 316* 168* 154*  BUN 9 14 23*  CREATININE 0.81 0.82 1.18  CALCIUM  8.9 8.1* 8.0*  MG  --  2.0 2.0   Liver Function Tests: No results for input(s): AST, ALT, ALKPHOS, BILITOT, PROT, ALBUMIN in the last 168 hours. CBG: Recent Labs  Lab 01/20/24 1109 01/20/24 1641 01/20/24 2143 01/21/24 0201 01/21/24 0726  GLUCAP 217* 159* 151* 162* 151*    Discharge time spent: greater than 30 minutes.  Signed: Alm Schneider, MD [facility] 01/21/2024

## 2024-01-21 NOTE — Care Management Important Message (Signed)
 Important Message  Patient Details  Name: Gerald Ellis MRN: [id number] Date of Birth: January 21, 1966   Important Message Given:  N/A - LOS <3 / Initial given by admissions     [name] 01/21/2024, 11:38 AM

## 2024-01-22 ENCOUNTER — Telehealth (INDEPENDENT_AMBULATORY_CARE_PROVIDER_SITE_OTHER): Payer: Self-pay

## 2024-01-22 DIAGNOSIS — J9601 Acute respiratory failure with hypoxia: Secondary | ICD-10-CM | POA: Diagnosis not present

## 2024-01-22 DIAGNOSIS — I2692 Saddle embolus of pulmonary artery without acute cor pulmonale: Secondary | ICD-10-CM | POA: Diagnosis not present

## 2024-01-22 NOTE — Telephone Encounter (Signed)
 Patient spouse left a message asking if her husband will need to scheduled a sooner follow appt from recent hospital admission. Patient has been discharge from the hospital. Patient appt is schedule to come in 06/03/2024. Please Advise

## 2024-01-26 DIAGNOSIS — I2692 Saddle embolus of pulmonary artery without acute cor pulmonale: Secondary | ICD-10-CM | POA: Diagnosis not present

## 2024-01-26 DIAGNOSIS — I10 Essential (primary) hypertension: Secondary | ICD-10-CM | POA: Diagnosis not present

## 2024-01-26 DIAGNOSIS — J4540 Moderate persistent asthma, uncomplicated: Secondary | ICD-10-CM | POA: Diagnosis not present

## 2024-01-26 DIAGNOSIS — I2510 Atherosclerotic heart disease of native coronary artery without angina pectoris: Secondary | ICD-10-CM | POA: Diagnosis not present

## 2024-01-26 DIAGNOSIS — I82461 Acute embolism and thrombosis of right calf muscular vein: Secondary | ICD-10-CM | POA: Diagnosis not present

## 2024-01-26 DIAGNOSIS — E785 Hyperlipidemia, unspecified: Secondary | ICD-10-CM | POA: Diagnosis not present

## 2024-01-27 ENCOUNTER — Encounter

## 2024-02-02 DIAGNOSIS — Z89511 Acquired absence of right leg below knee: Secondary | ICD-10-CM | POA: Diagnosis not present

## 2024-02-03 ENCOUNTER — Encounter: Admitting: Physical Therapy

## 2024-02-05 ENCOUNTER — Other Ambulatory Visit (INDEPENDENT_AMBULATORY_CARE_PROVIDER_SITE_OTHER): Payer: Self-pay | Admitting: Vascular Surgery

## 2024-02-05 ENCOUNTER — Encounter

## 2024-02-05 DIAGNOSIS — Z89511 Acquired absence of right leg below knee: Secondary | ICD-10-CM

## 2024-02-05 DIAGNOSIS — I82411 Acute embolism and thrombosis of right femoral vein: Secondary | ICD-10-CM

## 2024-02-06 ENCOUNTER — Other Ambulatory Visit (INDEPENDENT_AMBULATORY_CARE_PROVIDER_SITE_OTHER)

## 2024-02-06 DIAGNOSIS — Z89511 Acquired absence of right leg below knee: Secondary | ICD-10-CM

## 2024-02-06 DIAGNOSIS — I82411 Acute embolism and thrombosis of right femoral vein: Secondary | ICD-10-CM | POA: Diagnosis not present

## 2024-02-10 ENCOUNTER — Encounter

## 2024-02-11 DIAGNOSIS — Z79891 Long term (current) use of opiate analgesic: Secondary | ICD-10-CM | POA: Diagnosis not present

## 2024-02-11 DIAGNOSIS — G894 Chronic pain syndrome: Secondary | ICD-10-CM | POA: Diagnosis not present

## 2024-02-11 DIAGNOSIS — F1120 Opioid dependence, uncomplicated: Secondary | ICD-10-CM | POA: Diagnosis not present

## 2024-02-12 ENCOUNTER — Encounter

## 2024-02-17 ENCOUNTER — Encounter

## 2024-02-19 ENCOUNTER — Encounter

## 2024-02-21 DIAGNOSIS — I2692 Saddle embolus of pulmonary artery without acute cor pulmonale: Secondary | ICD-10-CM | POA: Diagnosis not present

## 2024-02-21 DIAGNOSIS — J9601 Acute respiratory failure with hypoxia: Secondary | ICD-10-CM | POA: Diagnosis not present

## 2024-02-24 ENCOUNTER — Encounter (INDEPENDENT_AMBULATORY_CARE_PROVIDER_SITE_OTHER): Payer: Self-pay | Admitting: Vascular Surgery

## 2024-02-24 ENCOUNTER — Encounter

## 2024-02-24 ENCOUNTER — Ambulatory Visit (INDEPENDENT_AMBULATORY_CARE_PROVIDER_SITE_OTHER): Admitting: Vascular Surgery

## 2024-02-24 VITALS — BP 136/88 | HR 80 | Resp 18 | Ht 70.0 in | Wt 265.0 lb

## 2024-02-24 DIAGNOSIS — I82411 Acute embolism and thrombosis of right femoral vein: Secondary | ICD-10-CM

## 2024-02-24 DIAGNOSIS — I739 Peripheral vascular disease, unspecified: Secondary | ICD-10-CM

## 2024-02-24 DIAGNOSIS — I10 Essential (primary) hypertension: Secondary | ICD-10-CM

## 2024-02-24 DIAGNOSIS — I2692 Saddle embolus of pulmonary artery without acute cor pulmonale: Secondary | ICD-10-CM | POA: Diagnosis not present

## 2024-02-24 DIAGNOSIS — I82409 Acute embolism and thrombosis of unspecified deep veins of unspecified lower extremity: Secondary | ICD-10-CM | POA: Insufficient documentation

## 2024-02-24 NOTE — Assessment & Plan Note (Signed)
 Treated with anticoagulation and not pulmonary thrombectomy and the patient remained stable.  This was about a month or 5 weeks ago.  No role for intervention at this time.  Continue anticoagulation for 1 year.

## 2024-02-24 NOTE — Assessment & Plan Note (Signed)
 A repeat ultrasound that we performed 2 weeks ago showed an age-indeterminate right lower extremity DVT involving the right popliteal, femoral, and common femoral veins although the clot burden was decreased from his initial study a little over a month ago at an outside center. He is now 5 weeks out from his original DVT and PE diagnosis.  I would wait 6 weeks before resuming physical therapy but believe he should resume physical therapy at that time.  This will help with his prosthetic training and ambulatory status.  We have a follow-up visit coming up in a few months.

## 2024-02-24 NOTE — Progress Notes (Signed)
 MRN : [id number]  Gerald Ellis is a 58 y.o. ([birth year]) male who presents with chief complaint of  Chief Complaint  Patient presents with   Follow-up    Follow up post PE + DVT  .  History of Present Illness: Patient returns today in follow up prior to his scheduled visit for a new DVT of the right lower extremity.  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  He had been working with physical therapy with his prosthesis training and his fitting.  He developed severe pain in the right leg and had had swelling going on for a couple of weeks prior to this episode.  He then had significant chest pain and shortness of breath and was found to have a PE that was present as well as a fairly extensive right lower extremity DVT.  The pulmonary embolus clot burden was large and his troponin was elevated, but ultimately they decided to treat him with anticoagulation alone instead of pulmonary thrombectomy.  This was at an outside facility.  He was appropriately started on anticoagulation.  The swelling in the leg is gone down.  His breathing is back close to normal although he does still have some chest pain.  He is tolerating anticoagulation with no signs of bleeding.  He has not done physical therapy or had his prosthesis training because of this for about 4 to 5 weeks now.  A repeat ultrasound that we performed 2 weeks ago showed an age-indeterminate right lower extremity DVT involving the right popliteal, femoral, and common femoral veins although the clot burden was decreased from his initial study a little over a month ago at an outside center. We had checked his ABIs few months ago and his left ABI was in the normal range.  Current Outpatient Medications  Medication Sig Dispense Refill   albuterol  (VENTOLIN  HFA) 108 (90 Base) MCG/ACT inhaler Inhale 1-2 puffs into the lungs every 6 (six) hours as needed for wheezing or shortness of breath. 1 each 1   apixaban  (ELIQUIS ) 5 MG TABS  tablet Take 2 tablets (10 mg total) by mouth 2 (two) times daily for 7 days, THEN 1 tablet (5 mg total) 2 (two) times daily. 60 tablet 2   aspirin  81 MG chewable tablet Chew 1 tablet (81 mg total) by mouth daily. 30 tablet 11   atorvastatin  (LIPITOR ) 80 MG tablet Take 1 tablet (80 mg total) by mouth daily at 6 PM. 30 tablet 11   budesonide -formoterol  (SYMBICORT ) 80-4.5 MCG/ACT inhaler Inhale 2 puffs into the lungs daily as needed (SOB). 1 each 1   cetirizine (ZYRTEC) 10 MG tablet Take 10 mg by mouth daily.     Continuous Glucose Sensor (FREESTYLE LIBRE 2 SENSOR) MISC USE 1 KIT EVERY 14 (FOURTEEN) DAYS FOR GLUCOSE MONITORING     fluticasone  (FLONASE ) 50 MCG/ACT nasal spray Place 1 spray into both nostrils 2 (two) times daily as needed (nasal congestion).     gabapentin  (NEURONTIN ) 600 MG tablet Take 600 mg by mouth 3 (three) times daily.     HUMALOG KWIKPEN 200 UNIT/ML KwikPen Inject 50 Units into the skin with breakfast, with lunch, and with evening meal.     insulin  degludec (TRESIBA  FLEXTOUCH) 200 UNIT/ML FlexTouch Pen Inject 90 Units into the skin daily at 10 pm.     isosorbide  mononitrate (IMDUR ) 30 MG 24 hr tablet Take 30 mg by mouth daily.     lisinopril  (ZESTRIL ) 20 MG tablet Take 20 mg by mouth  daily.     metFORMIN (GLUCOPHAGE) 1000 MG tablet Take 1,000 mg by mouth 2 (two) times daily.      metoprolol  succinate (TOPROL -XL) 50 MG 24 hr tablet Take 1 tablet (50 mg total) by mouth daily. Take with or immediately following a meal. 30 tablet 1   naloxone (NARCAN) nasal spray 4 mg/0.1 mL Place 0.4 mg into the nose once.     oxyCODONE -acetaminophen  (PERCOCET) 10-325 MG tablet Take 1 tablet by mouth every 6 (six) hours as needed for pain.     pantoprazole  (PROTONIX ) 40 MG tablet Take 1 tablet (40 mg total) by mouth daily. 30 tablet 1   pioglitazone  (ACTOS ) 15 MG tablet Take 15 mg by mouth 2 (two) times daily.     tadalafil (CIALIS) 20 MG tablet Take 20 mg by mouth daily as needed for erectile  dysfunction.     No current facility-administered medications for this visit.    Past Medical History:  Diagnosis Date   Benign enlargement of prostate    Bronchitis    Childhood asthma    Diabetes mellitus without complication (HCC)    Environmental allergies    Erectile dysfunction    Headache    Hemorrhoids    Hypercholesteremia    Hypertension    Hypogonadism in male    Lumbago    Over weight     Past Surgical History:  Procedure Laterality Date   AMPUTATION Right 05/15/2023   Procedure: AMPUTATION BELOW KNEE;  Surgeon: Marea Selinda RAMAN, MD;  Location: [facility];  Service: General;  Laterality: Right;   AMPUTATION Right 05/16/2023   Procedure: REVISION OF BELOW KNEE AMPUTATION;  Surgeon: Marea Selinda RAMAN, MD;  Location: [facility];  Service: General;  Laterality: Right;   BACK SURGERY  05/20/1990   CHOLECYSTECTOMY     COLONOSCOPY N/A 12/26/2023   Procedure: COLONOSCOPY;  Surgeon: Maryruth Ole DASEN, MD;  Location: [facility];  Service: Endoscopy;  Laterality: N/A;   COLONOSCOPY WITH PROPOFOL  N/A 08/07/2015   Procedure: COLONOSCOPY WITH PROPOFOL ;  Surgeon: Gladis RAYMOND Mariner, MD;  Location: [facility] ENDOSCOPY;  Service: Endoscopy;  Laterality: N/A;   COLONOSCOPY WITH PROPOFOL  N/A 08/08/2015   Procedure: COLONOSCOPY WITH PROPOFOL ;  Surgeon: Gladis RAYMOND Mariner, MD;  Location: [facility] ENDOSCOPY;  Service: Endoscopy;  Laterality: N/A;   COLONOSCOPY WITH PROPOFOL  N/A 09/16/2017   Procedure: COLONOSCOPY WITH PROPOFOL ;  Surgeon: Mariner Gladis RAYMOND, MD;  Location: [facility] ENDOSCOPY;  Service: Endoscopy;  Laterality: N/A;   COLONOSCOPY WITH PROPOFOL  N/A 12/20/2020   Procedure: COLONOSCOPY WITH PROPOFOL ;  Surgeon: Dessa Reyes ORN, MD;  Location: [facility] ENDOSCOPY;  Service: Endoscopy;  Laterality: N/A;  IDDM   CORONARY STENT INTERVENTION N/A 04/20/2019   Procedure: CORONARY STENT INTERVENTION;  Surgeon: Ammon Blunt, MD;  Location: [facility];  Service: Cardiovascular;  Laterality:  N/A;   ESOPHAGOGASTRODUODENOSCOPY N/A 12/26/2023   Procedure: EGD (ESOPHAGOGASTRODUODENOSCOPY);  Surgeon: Maryruth Ole DASEN, MD;  Location: [facility]COPY;  Service: Endoscopy;  Laterality: N/A;   ESOPHAGOGASTRODUODENOSCOPY (EGD) WITH PROPOFOL  N/A 08/07/2015   Procedure: ESOPHAGOGASTRODUODENOSCOPY (EGD) WITH PROPOFOL ;  Surgeon: Gladis RAYMOND Mariner, MD;  Location: [facility] ENDOSCOPY;  Service: Endoscopy;  Laterality: N/A;   ESOPHAGOGASTRODUODENOSCOPY (EGD) WITH PROPOFOL  N/A 09/16/2017   Procedure: ESOPHAGOGASTRODUODENOSCOPY (EGD) WITH PROPOFOL ;  Surgeon: Mariner Gladis RAYMOND, MD;  Location: [facility] ENDOSCOPY;  Service: Endoscopy;  Laterality: N/A;   ESOPHAGOGASTRODUODENOSCOPY (EGD) WITH PROPOFOL  N/A 12/20/2020   Procedure: ESOPHAGOGASTRODUODENOSCOPY (EGD) WITH PROPOFOL ;  Surgeon: Dessa Reyes ORN, MD;  Location: [facility] ENDOSCOPY;  Service: Endoscopy;  Laterality: N/A;   HEMOSTASIS CLIP PLACEMENT  12/26/2023   Procedure: CONTROL OF HEMORRHAGE, GI TRACT, ENDOSCOPIC, BY CLIPPING OR OVERSEWING;  Surgeon: Maryruth Ole DASEN, MD;  Location: [facility] ENDOSCOPY;  Service: Endoscopy;;   IRRIGATION AND DEBRIDEMENT FOOT Right 05/12/2023   Procedure: IRRIGATION AND DEBRIDEMENT FOOT, WASH OUT;  Surgeon: Neill Boas, DPM;  Location: [facility];  Service: Orthopedics/Podiatry;  Laterality: Right;   LEFT HEART CATH AND CORONARY ANGIOGRAPHY Left 04/20/2019   Procedure: LEFT HEART CATH AND CORONARY ANGIOGRAPHY;  Surgeon: Ammon Blunt, MD;  Location: [facility];  Service: Cardiovascular;  Laterality: Left;   LOWER EXTREMITY ANGIOGRAPHY Right 05/09/2023   Procedure: Lower Extremity Angiography;  Surgeon: Jama Cordella MATSU, MD;  Location: [facility];  Service: Cardiovascular;  Laterality: Right;   POLYPECTOMY  12/26/2023   Procedure: POLYPECTOMY, INTESTINE;  Surgeon: Maryruth Ole DASEN, MD;  Location: [facility] ENDOSCOPY;  Service: Endoscopy;;   TRANSMETATARSAL AMPUTATION Right 05/08/2023   Procedure:  TRANSMETATARSAL AMPUTATION;  Surgeon: Neill Boas, DPM;  Location: [facility];  Service: Orthopedics/Podiatry;  Laterality: Right;     Social History   Tobacco Use   Smoking status: Never   Smokeless tobacco: Never  Vaping Use   Vaping status: Never Used  Substance Use Topics   Alcohol use: Yes    Alcohol/week: 0.0 standard drinks of alcohol    Comment: occasionally   Drug use: No      Family History  Problem Relation Age of Onset   Heart disease Mother        CABG   Lung cancer Mother        Disseminated   Hypertension Mother    Hyperlipidemia Mother    Diabetes Father        mother   Colon cancer Father 45   Cancer Maternal Aunt        x3 aunts, unk types of cancer   Colon cancer Maternal Uncle        x3 uncles   Prostate cancer Maternal Uncle        x4 uncles   Cancer Paternal Uncle        prostate vs colon   Alzheimer's disease Other    Kidney disease Neg Hx      Allergies  Allergen Reactions   Ibuprofen Shortness Of Breath   Nsaids Swelling    Swelling of throat   Fish Oil Nausea And Vomiting, Nausea Only and Other (See Comments)    Acid reflux     REVIEW OF SYSTEMS (Negative unless checked)  Constitutional: [] Weight loss  [] Fever  [] Chills Cardiac: [] Chest pain   [] Chest pressure   [] Palpitations   [] Shortness of breath when laying flat   [] Shortness of breath at rest   [] Shortness of breath with exertion. Vascular:  [] Pain in legs with walking   [] Pain in legs at rest   [] Pain in legs when laying flat   [] Claudication   [] Pain in feet when walking  [] Pain in feet at rest  [] Pain in feet when laying flat   [x] History of DVT   [x] Phlebitis   [x] Swelling in legs   [] Varicose veins   [] Non-healing ulcers Pulmonary:   [] Uses home oxygen    [] Productive cough   [] Hemoptysis   [] Wheeze  [] COPD   [] Asthma Neurologic:  [] Dizziness  [] Blackouts   [] Seizures   [] History of stroke   [] History of TIA  [] Aphasia   [] Temporary blindness   [] Dysphagia   [x] Weakness or  numbness in arms   []   Weakness or numbness in legs Musculoskeletal:  [] Arthritis   [] Joint swelling   [x] Joint pain   [x] Low back pain Hematologic:  [] Easy bruising  [] Easy bleeding   [] Hypercoagulable state   [] Anemic   Gastrointestinal:  [] Blood in stool   [] Vomiting blood  [] Gastroesophageal reflux/heartburn   [] Abdominal pain Genitourinary:  [] Chronic kidney disease   [] Difficult urination  [] Frequent urination  [] Burning with urination   [] Hematuria Skin:  [] Rashes   [] Ulcers   [] Wounds Psychological:  [] History of anxiety   []  History of major depression.  Physical Examination  BP 136/88 (BP Location: Left Arm, Patient Position: Sitting, Cuff Size: Normal)   Pulse 80   Resp 18   Ht 5' 10 (1.778 m)   Wt 265 lb (120.2 kg)   BMI 38.02 kg/m  Gen:  WD/WN, NAD. Appears older than stated age. Head: [place]/AT, No temporalis wasting. Ear/Nose/Throat: Hearing grossly intact, nares w/o erythema or drainage Eyes: Conjunctiva clear. Sclera non-icteric Neck: Supple.  Trachea midline Pulmonary:  Good air movement, no use of accessory muscles.  Cardiac: RRR, no JVD Vascular:  Vessel Right Left  Radial Palpable Palpable                          PT Not Palpable Palpable  DP Not Palpable Palpable   Gastrointestinal: soft, non-tender/non-distended. No guarding/reflex.  Musculoskeletal: M/S 5/5 throughout.  Right below-knee amputation with stump shrinker in place.  No significant right lower extremity edema at this time. Neurologic: Sensation grossly intact in extremities.  Symmetrical.  Speech is fluent.  Psychiatric: Judgment intact, Mood & affect appropriate for pt's clinical situation. Dermatologic: No rashes or ulcers noted.  No cellulitis or open wounds.      Labs Recent Results (from the past 2160 hours)  VAS US  ABI WITH/WO TBI     Status: None   Collection Time: 12/02/23  2:26 PM  Result Value Ref Range   Right ABI BKA    Left ABI 1.21   Surgical pathology     Status: None    Collection Time: 12/26/23 12:00 AM  Result Value Ref Range   SURGICAL PATHOLOGY      SURGICAL PATHOLOGY [facility] [address], [address] [place], [place] [address] Telephone [phone] or [phone] Fax [phone]  REPORT OF SURGICAL PATHOLOGY   Accession #: [id number] Patient Name: Gerald Ellis, RUETH Visit # : [id number]  MRN: [id number] Physician: Maryruth Smalls DOB/Age 04-09-1966 (Age: 45) Gender: M Collected Date: 12/26/2023 Received Date: 12/26/2023  FINAL DIAGNOSIS       1. Stomach, biopsy, cbx :       REACTIVE GASTROPATHY WITH MINIMAL CHRONIC GASTRITIS      NEGATIVE FOR H. PYLORI, INTESTINAL METAPLASIA, DYSPLASIA AND CARCINOMA       2. Hepatic Flexure Polyp, cold snare :       TUBULAR ADENOMA      NEGATIVE FOR HIGH-GRADE DYSPLASIA AND CARCINOMA       3. Transverse Colon Polyp, distal, hot snare :       TUBULAR ADENOMA      NEGATIVE FOR HIGH-GRADE DYSPLASIA AND CARCINOMA      STALK MARGIN FREE OF ADENOMATOUS CHANGE       ELECTRONIC SIGNATURE : Picklesimer Md, Fred , Sports administrator, Scientist, research (medical) ure  MICROSCOPIC DESCRIPTION  CASE COMMENTS STAINS USED IN DIAGNOSIS: H&E H&E H&E    CLINICAL HISTORY  SPECIMEN(S) OBTAINED 1. Stomach, biopsy, Cbx 2. Hepatic Flexure Polyp, Cold Snare 3.  Transverse Colon Polyp, Distal, Hot Snare  SPECIMEN COMMENTS: 1. R/O h pylori SPECIMEN CLINICAL INFORMATION: 1. History of adenomatous polyp of colon, gastroesophageal reflux disease unspecified whether esophagitis present, pharyngoesophageal dysphagia, gastritis, diverticulosis, colon polyps    Gross Description 1. Received in formalin are tan, soft tissue fragments that are submitted in toto.Number:  3,   Size:  0.1-0.5 cm, 1 block 2. Received in formalin are tan, soft tissue fragments that are submitted in toto.Number:  4,   Size:  0.6 cm, 1 block. 3. Received in formalin is a 1.1 cm fragment of tan-brown, soft  tissue.The margin is inked blue, specimen is sectioned and submitted entirely in 1 block.mb 12/26/2023        Report signed out from the following  location(s) [place]. [facility] [address] [place], [place] [address] CLIA #: [id number]  [facility] [address] [place], [place] [address] CLIA #: [id number]   Glucose, capillary     Status: Abnormal   Collection Time: 12/26/23  8:05 AM  Result Value Ref Range   Glucose-Capillary 189 (H) 70 - 99 mg/dL    Comment: Glucose reference range applies only to samples taken after fasting for at least 8 hours.  Basic metabolic panel     Status: Abnormal   Collection Time: 01/19/24  1:32 AM  Result Value Ref Range   Sodium 135 135 - 145 mmol/L   Potassium 3.2 (L) 3.5 - 5.1 mmol/L   Chloride 97 (L) 98 - 111 mmol/L   CO2 22 22 - 32 mmol/L   Glucose, Bld 316 (H) 70 - 99 mg/dL    Comment: Glucose reference range applies only to samples taken after fasting for at least 8 hours.   BUN 9 6 - 20 mg/dL   Creatinine, Ser 9.18 0.61 - 1.24 mg/dL   Calcium  8.9 8.9 - 10.3 mg/dL   GFR, Estimated >39 >39 mL/min    Comment: (NOTE) Calculated using the CKD-EPI Creatinine Equation (2021)    Anion gap 16 (H) 5 - 15    Comment: Performed at [facility], [address], [place], [place] [address]  Troponin I (High Sensitivity)     Status: Abnormal   Collection Time: 01/19/24  1:32 AM  Result Value Ref Range   Troponin I (High Sensitivity) 55 (H) <18 ng/L    Comment: (NOTE) Elevated high sensitivity troponin I (hsTnI) values and significant  changes across serial measurements may suggest ACS but many other  chronic and acute conditions are known to elevate hsTnI results.  Refer to the Links section for chest pain algorithms and additional  guidance. Performed at [facility], [address], [place], [place] [address]   CBC     Status: Abnormal   Collection Time: 01/19/24  1:32 AM  Result Value Ref Range   WBC  10.6 (H) 4.0 - 10.5 K/uL   RBC 4.98 4.22 - 5.81 MIL/uL   Hemoglobin 14.3 13.0 - 17.0 g/dL   HCT 58.6 60.9 - 47.9 %   MCV 82.9 80.0 - 100.0 fL   MCH 28.7 26.0 - 34.0 pg   MCHC 34.6 30.0 - 36.0 g/dL   RDW 86.7 88.4 - 84.4 %   Platelets 258 150 - 400 K/uL   nRBC 0.0 0.0 - 0.2 %    Comment: Performed at [facility], [address], [place], [place] [address]  Hemoglobin A1c     Status: Abnormal   Collection Time: 01/19/24  1:32 AM  Result Value Ref Range   Hgb A1c MFr Bld 9.2 (H) 4.8 - 5.6 %    Comment: (NOTE)         Prediabetes: 5.7 - 6.4         Diabetes: >6.4         Glycemic control for adults with diabetes: <7.0    Mean Plasma Glucose 217 mg/dL    Comment: (NOTE) Performed At: [facility] [address] [place], [place] [id number] Jennette Shorter MD Ey:[id number]   Troponin I (High Sensitivity)     Status: Abnormal   Collection Time: 01/19/24  3:36 AM  Result Value Ref Range   Troponin I (High Sensitivity) 66 (H) <18 ng/L    Comment: (NOTE) Elevated high sensitivity troponin I (hsTnI) values and significant  changes across serial measurements may suggest ACS but many other  chronic and acute conditions are known to elevate hsTnI results.  Refer to the Links section for chest pain algorithms and additional  guidance. Performed at [facility], [address], [place], [place] [address]   Protime-INR     Status: None   Collection Time: 01/19/24  3:36 AM  Result Value Ref Range   Prothrombin Time 14.2 11.4 - 15.2 seconds   INR 1.0 0.8 - 1.2    Comment: (NOTE) INR goal varies based on device and disease states. Performed at [facility], [address], [place], [place] [address]   APTT     Status: None   Collection Time: 01/19/24  3:36 AM  Result Value Ref Range   aPTT 26 24 - 36 seconds    Comment: Performed at [facility], [address], [place], [place] [address]  MRSA Next Gen by PCR, Nasal     Status: None   Collection Time: 01/19/24  6:33 AM    Specimen: Nasal Mucosa; Nasal Swab  Result Value Ref Range   MRSA by PCR Next Gen NOT DETECTED NOT DETECTED    Comment: (NOTE) The GeneXpert MRSA Assay (FDA approved for NASAL specimens only), is one component of a comprehensive MRSA colonization surveillance program. It is not intended to diagnose MRSA infection nor to guide or monitor treatment for MRSA infections. Test performance is not FDA approved in patients less than 52 years old. Performed at [facility], [address], [place], [place] [address]   Glucose, capillary     Status: Abnormal   Collection Time: 01/19/24  7:51 AM  Result Value Ref Range   Glucose-Capillary 228 (H) 70 - 99 mg/dL    Comment: Glucose reference range applies only to samples taken after fasting for at least 8 hours.  Heparin  level (unfractionated)     Status: Abnormal   Collection Time: 01/19/24 10:29 AM  Result Value Ref Range   Heparin  Unfractionated 0.25 (L) 0.30 - 0.70 IU/mL    Comment: (NOTE) The clinical reportable range upper limit is being lowered to >1.10 to align with the FDA approved guidance for the current laboratory assay.  If heparin  results are below expected values, and patient dosage has  been confirmed, suggest follow up testing of antithrombin III levels. Performed at [facility], [address], [place], [place] [address]   Glucose, capillary     Status: Abnormal   Collection Time: 01/19/24 11:14 AM  Result Value Ref Range   Glucose-Capillary 225 (H) 70 - 99 mg/dL    Comment: Glucose reference range applies only to samples taken after fasting for at least 8 hours.   Comment 1  Notify RN    Comment 2 Document in Chart   Heparin  level (unfractionated)     Status: None   Collection Time: 01/19/24  2:39 PM  Result Value Ref Range   Heparin  Unfractionated 0.41 0.30 - 0.70 IU/mL    Comment: (NOTE) The clinical reportable range upper limit is being lowered to >1.10 to align with the FDA approved guidance for the current  laboratory assay.  If heparin  results are below expected values, and patient dosage has  been confirmed, suggest follow up testing of antithrombin III levels. Performed at [facility], [address] [place], [place] [address]   ECHOCARDIOGRAM COMPLETE     Status: None   Collection Time: 01/19/24  2:41 PM  Result Value Ref Range   Weight 3,880.1 oz   Height 69 in   BP 174/92 mmHg   S' Lateral 3.20 cm   Est EF 60 - 65%   Glucose, capillary     Status: Abnormal   Collection Time: 01/19/24  4:11 PM  Result Value Ref Range   Glucose-Capillary 242 (H) 70 - 99 mg/dL    Comment: Glucose reference range applies only to samples taken after fasting for at least 8 hours.   Comment 1 Notify RN    Comment 2 Document in Chart   Heparin  level (unfractionated)     Status: None   Collection Time: 01/19/24  8:16 PM  Result Value Ref Range   Heparin  Unfractionated 0.43 0.30 - 0.70 IU/mL    Comment: (NOTE) The clinical reportable range upper limit is being lowered to >1.10 to align with the FDA approved guidance for the current laboratory assay.  If heparin  results are below expected values, and patient dosage has  been confirmed, suggest follow up testing of antithrombin III levels. Performed at [facility], [address] [place], [place] [address]   Glucose, capillary     Status: Abnormal   Collection Time: 01/19/24  9:41 PM  Result Value Ref Range   Glucose-Capillary 181 (H) 70 - 99 mg/dL    Comment: Glucose reference range applies only to samples taken after fasting for at least 8 hours.  Heparin  level (unfractionated)     Status: None   Collection Time: 01/20/24  4:44 AM  Result Value Ref Range   Heparin  Unfractionated 0.43 0.30 - 0.70 IU/mL    Comment: (NOTE) The clinical reportable range upper limit is being lowered to >1.10 to align with the FDA approved guidance for the current laboratory assay.  If heparin  results are below expected values, and patient dosage has  been  confirmed, suggest follow up testing of antithrombin III levels. Performed at [facility], [address], [place], [place] [address]   CBC     Status: Abnormal   Collection Time: 01/20/24  4:44 AM  Result Value Ref Range   WBC 11.9 (H) 4.0 - 10.5 K/uL   RBC 4.08 (L) 4.22 - 5.81 MIL/uL   Hemoglobin 11.9 (L) 13.0 - 17.0 g/dL   HCT 65.0 (L) 60.9 - 47.9 %   MCV 85.5 80.0 - 100.0 fL   MCH 29.2 26.0 - 34.0 pg   MCHC 34.1 30.0 - 36.0 g/dL   RDW 86.5 88.4 - 84.4 %   Platelets 198 150 - 400 K/uL   nRBC 0.0 0.0 - 0.2 %    Comment: Performed at [facility], [address] [place], [place] [address]  Basic metabolic panel with GFR     Status: Abnormal   Collection Time:  01/20/24  4:44 AM  Result Value Ref Range   Sodium 138 135 - 145 mmol/L   Potassium 3.6 3.5 - 5.1 mmol/L   Chloride 104 98 - 111 mmol/L   CO2 24 22 - 32 mmol/L   Glucose, Bld 168 (H) 70 - 99 mg/dL    Comment: Glucose reference range applies only to samples taken after fasting for at least 8 hours.   BUN 14 6 - 20 mg/dL   Creatinine, Ser 9.17 0.61 - 1.24 mg/dL   Calcium  8.1 (L) 8.9 - 10.3 mg/dL   GFR, Estimated >39 >39 mL/min    Comment: (NOTE) Calculated using the CKD-EPI Creatinine Equation (2021)    Anion gap 10 5 - 15    Comment: Performed at [facility], [address], [place], [place] [address]  Magnesium      Status: None   Collection Time: 01/20/24  4:44 AM  Result Value Ref Range   Magnesium  2.0 1.7 - 2.4 mg/dL    Comment: Performed at [facility], [address] [place], [place] [address]  Glucose, capillary     Status: Abnormal   Collection Time: 01/20/24  7:47 AM  Result Value Ref Range   Glucose-Capillary 177 (H) 70 - 99 mg/dL    Comment: Glucose reference range applies only to samples taken after fasting for at least 8 hours.  Glucose, capillary     Status: Abnormal   Collection Time: 01/20/24 11:09 AM  Result Value Ref Range   Glucose-Capillary 217 (H) 70 - 99 mg/dL    Comment: Glucose reference  range applies only to samples taken after fasting for at least 8 hours.  Glucose, capillary     Status: Abnormal   Collection Time: 01/20/24  4:41 PM  Result Value Ref Range   Glucose-Capillary 159 (H) 70 - 99 mg/dL    Comment: Glucose reference range applies only to samples taken after fasting for at least 8 hours.  Glucose, capillary     Status: Abnormal   Collection Time: 01/20/24  9:43 PM  Result Value Ref Range   Glucose-Capillary 151 (H) 70 - 99 mg/dL    Comment: Glucose reference range applies only to samples taken after fasting for at least 8 hours.  Glucose, capillary     Status: Abnormal   Collection Time: 01/21/24  2:01 AM  Result Value Ref Range   Glucose-Capillary 162 (H) 70 - 99 mg/dL    Comment: Glucose reference range applies only to samples taken after fasting for at least 8 hours.  Heparin  level (unfractionated)     Status: Abnormal   Collection Time: 01/21/24  4:05 AM  Result Value Ref Range   Heparin  Unfractionated 0.28 (L) 0.30 - 0.70 IU/mL    Comment: (NOTE) The clinical reportable range upper limit is being lowered to >1.10 to align with the FDA approved guidance for the current laboratory assay.  If heparin  results are below expected values, and patient dosage has  been confirmed, suggest follow up testing of antithrombin III levels. Performed at [facility], [address] [place], [place] [address]   CBC     Status: Abnormal   Collection Time: 01/21/24  4:05 AM  Result Value Ref Range   WBC 11.5 (H) 4.0 - 10.5 K/uL   RBC 3.91 (L) 4.22 - 5.81 MIL/uL   Hemoglobin 11.4 (L) 13.0 - 17.0 g/dL   HCT 65.9 (L) 60.9 - 47.9 %   MCV 87.0 80.0 - 100.0 fL   MCH 29.2 26.0 -  34.0 pg   MCHC 33.5 30.0 - 36.0 g/dL   RDW 86.3 88.4 - 84.4 %   Platelets 223 150 - 400 K/uL   nRBC 0.0 0.0 - 0.2 %    Comment: Performed at [facility], [address] [place], [place] [address]  Basic metabolic panel with GFR     Status: Abnormal   Collection Time: 01/21/24  4:05 AM   Result Value Ref Range   Sodium 135 135 - 145 mmol/L   Potassium 3.5 3.5 - 5.1 mmol/L   Chloride 103 98 - 111 mmol/L   CO2 23 22 - 32 mmol/L   Glucose, Bld 154 (H) 70 - 99 mg/dL    Comment: Glucose reference range applies only to samples taken after fasting for at least 8 hours.   BUN 23 (H) 6 - 20 mg/dL   Creatinine, Ser 8.81 0.61 - 1.24 mg/dL   Calcium  8.0 (L) 8.9 - 10.3 mg/dL   GFR, Estimated >39 >39 mL/min    Comment: (NOTE) Calculated using the CKD-EPI Creatinine Equation (2021)    Anion gap 9 5 - 15    Comment: Performed at [facility], [address] [place], [place] [address]  Magnesium      Status: None   Collection Time: 01/21/24  4:05 AM  Result Value Ref Range   Magnesium  2.0 1.7 - 2.4 mg/dL    Comment: Performed at [facility], [address], [place], [place] [address]  Glucose, capillary     Status: Abnormal   Collection Time: 01/21/24  7:26 AM  Result Value Ref Range   Glucose-Capillary 151 (H) 70 - 99 mg/dL    Comment: Glucose reference range applies only to samples taken after fasting for at least 8 hours.    Radiology VAS US  LOWER EXTREMITY VENOUS (DVT) Result Date: 02/09/2024  Lower Venous DVT Study Patient Name:  Gerald Ellis  Date of Exam:   02/06/2024 Medical Rec #: [id number]         Accession #:    [id number] Date of Birth: [birth year]        Patient Gender: M Patient Age:   29 years Exam Location:  [facility] Procedure:      VAS US  LOWER EXTREMITY VENOUS (DVT) Referring Phys: SELINDA Wyatt Galvan --------------------------------------------------------------------------------  Indications: Rt leg BKA DVT and PE.  Comparison Study: 01/19/2024 Performing Technologist: Jerel Croak RVT  Examination Guidelines: A complete evaluation includes B-mode imaging, spectral Doppler, color Doppler, and power Doppler as needed of all accessible portions of each vessel. Bilateral testing is considered an integral part of a complete examination. Limited examinations for  reoccurring indications may be performed as noted. The reflux portion of the exam is performed with the patient in reverse Trendelenburg.  +---------+---------------+---------+-----------+----------+--------------+ RIGHT    CompressibilityPhasicitySpontaneityPropertiesThrombus Aging +---------+---------------+---------+-----------+----------+--------------+ CFV      Partial        Yes                           Continued      +---------+---------------+---------+-----------+----------+--------------+ SFJ      Full           Yes                                          +---------+---------------+---------+-----------+----------+--------------+ FV Prox  Partial  Continued      +---------+---------------+---------+-----------+----------+--------------+ FV Mid   Partial                                      Continued      +---------+---------------+---------+-----------+----------+--------------+ FV DistalPartial                                      Continued      +---------+---------------+---------+-----------+----------+--------------+ PFV      Full                                                        +---------+---------------+---------+-----------+----------+--------------+ POP      Partial                                      Continued      +---------+---------------+---------+-----------+----------+--------------+ GSV      Full           Yes                                          +---------+---------------+---------+-----------+----------+--------------+   +----+---------------+---------+-----------+----------+--------------+ LEFTCompressibilityPhasicitySpontaneityPropertiesThrombus Aging +----+---------------+---------+-----------+----------+--------------+ CFV Full           Yes                                          +----+---------------+---------+-----------+----------+--------------+ SFJ  Full           Yes                                          +----+---------------+---------+-----------+----------+--------------+     Summary: RIGHT: - Findings consistent with age indeterminate deep vein thrombosis involving the right common femoral vein, right popliteal vein, and right femoral vein.  - Findings appear improved from previous examination.   *See table(s) above for measurements and observations. Electronically signed by Selinda Gu MD on 02/09/2024 at 10:35:56 AM.    Final     Assessment/Plan  Saddle embolus of pulmonary artery (HCC) Treated with anticoagulation and not pulmonary thrombectomy and the patient remained stable.  This was about a month or 5 weeks ago.  No role for intervention at this time.  Continue anticoagulation for 1 year.  DVT (deep venous thrombosis) (HCC) A repeat ultrasound that we performed 2 weeks ago showed an age-indeterminate right lower extremity DVT involving the right popliteal, femoral, and common femoral veins although the clot burden was decreased from his initial study a little over a month ago at an outside center. He is now 5 weeks out from his original DVT and PE diagnosis.  I would wait 6 weeks before resuming physical therapy but believe he should resume physical therapy at that time.  This will help with his prosthetic training and ambulatory status.  We have a  follow-up visit coming up in a few months.  PAD (peripheral artery disease) (HCC) His left ABI a few months ago was normal 1.21 with triphasic waveforms and normal digital pressure.  Has already lost the right leg.  BKA is well-healed and they are working on the prosthesis.  Follow-up in 6 months.   Hx of BKA, right (HCC) The patient has expressed a sincere and further desire to ambulate with a prosthesis. He is currently a K3 functional level. He does agree to continue with use of prosthetic and to continue working with [facility]. At this time his limb is sufficiently healed for  receipt of her prosthesis.  Once he reaches 6 weeks out from his DVT, I think would be safe to resume physical therapy.  It is hoped that he will continue to be able to accomplish the ADLs he was able to do prior to amputation including, bathing, toileting, dressing, shopping and hopefully eventually returning back to some sort of gainful employment.   Needs his prosthesis adjusted and he is working with [facility] on that.  Follow-up in 6 months.   HTN (hypertension) blood pressure control important in reducing the progression of atherosclerotic disease. On appropriate oral medications.     Diabetes mellitus type 2, uncomplicated (HCC) blood glucose control important in reducing the progression of atherosclerotic disease. Also, involved in wound healing. On appropriate medications.  Selinda Gu, MD  02/24/2024 9:55 AM    This note was created with Dragon medical transcription system.  Any errors from dictation are purely unintentional

## 2024-02-26 ENCOUNTER — Encounter

## 2024-03-02 ENCOUNTER — Encounter

## 2024-03-03 DIAGNOSIS — I2699 Other pulmonary embolism without acute cor pulmonale: Secondary | ICD-10-CM | POA: Diagnosis not present

## 2024-03-03 DIAGNOSIS — Z89511 Acquired absence of right leg below knee: Secondary | ICD-10-CM | POA: Diagnosis not present

## 2024-03-03 DIAGNOSIS — I10 Essential (primary) hypertension: Secondary | ICD-10-CM | POA: Diagnosis not present

## 2024-03-03 DIAGNOSIS — I82431 Acute embolism and thrombosis of right popliteal vein: Secondary | ICD-10-CM | POA: Diagnosis not present

## 2024-03-04 ENCOUNTER — Encounter

## 2024-03-08 DIAGNOSIS — Z79891 Long term (current) use of opiate analgesic: Secondary | ICD-10-CM | POA: Diagnosis not present

## 2024-03-08 DIAGNOSIS — M1990 Unspecified osteoarthritis, unspecified site: Secondary | ICD-10-CM | POA: Diagnosis not present

## 2024-03-08 DIAGNOSIS — G894 Chronic pain syndrome: Secondary | ICD-10-CM | POA: Diagnosis not present

## 2024-03-08 DIAGNOSIS — M5116 Intervertebral disc disorders with radiculopathy, lumbar region: Secondary | ICD-10-CM | POA: Diagnosis not present

## 2024-03-08 DIAGNOSIS — M5030 Other cervical disc degeneration, unspecified cervical region: Secondary | ICD-10-CM | POA: Diagnosis not present

## 2024-03-08 DIAGNOSIS — G546 Phantom limb syndrome with pain: Secondary | ICD-10-CM | POA: Diagnosis not present

## 2024-03-09 ENCOUNTER — Encounter

## 2024-03-11 ENCOUNTER — Encounter

## 2024-03-16 ENCOUNTER — Other Ambulatory Visit (INDEPENDENT_AMBULATORY_CARE_PROVIDER_SITE_OTHER): Payer: Self-pay | Admitting: Nurse Practitioner

## 2024-03-16 ENCOUNTER — Encounter

## 2024-03-16 DIAGNOSIS — Z89511 Acquired absence of right leg below knee: Secondary | ICD-10-CM

## 2024-03-16 NOTE — Progress Notes (Unsigned)
 am

## 2024-03-18 ENCOUNTER — Encounter

## 2024-03-23 ENCOUNTER — Encounter

## 2024-03-24 ENCOUNTER — Ambulatory Visit

## 2024-03-25 ENCOUNTER — Encounter

## 2024-03-30 ENCOUNTER — Encounter

## 2024-04-01 ENCOUNTER — Encounter

## 2024-04-02 ENCOUNTER — Ambulatory Visit: Admitting: Physical Therapy

## 2024-04-05 DIAGNOSIS — Z79891 Long term (current) use of opiate analgesic: Secondary | ICD-10-CM | POA: Diagnosis not present

## 2024-04-06 ENCOUNTER — Encounter

## 2024-04-08 ENCOUNTER — Encounter

## 2024-04-13 ENCOUNTER — Encounter

## 2024-04-20 ENCOUNTER — Encounter

## 2024-04-21 ENCOUNTER — Ambulatory Visit: Attending: Nurse Practitioner

## 2024-04-21 ENCOUNTER — Ambulatory Visit

## 2024-04-21 DIAGNOSIS — M6281 Muscle weakness (generalized): Secondary | ICD-10-CM | POA: Diagnosis present

## 2024-04-21 DIAGNOSIS — Z89511 Acquired absence of right leg below knee: Secondary | ICD-10-CM | POA: Diagnosis not present

## 2024-04-21 DIAGNOSIS — R269 Unspecified abnormalities of gait and mobility: Secondary | ICD-10-CM | POA: Insufficient documentation

## 2024-04-21 DIAGNOSIS — R2689 Other abnormalities of gait and mobility: Secondary | ICD-10-CM | POA: Insufficient documentation

## 2024-04-21 DIAGNOSIS — R262 Difficulty in walking, not elsewhere classified: Secondary | ICD-10-CM | POA: Diagnosis present

## 2024-04-21 DIAGNOSIS — R278 Other lack of coordination: Secondary | ICD-10-CM | POA: Insufficient documentation

## 2024-04-21 NOTE — Therapy (Signed)
 OUTPATIENT PHYSICAL THERAPY LOWER EXTREMITY EVALUATION   Patient Name: Gerald Ellis MRN: [id number] DOB:03/12/1966, 58 y.o., male Today's Date: 04/21/2024  END OF SESSION:  PT End of Session - 04/21/24 1610     Visit Number 1    Number of Visits 25    Date for Recertification  07/14/24    PT Start Time 1600    PT Stop Time 1645    PT Time Calculation (min) 45 min    Equipment Utilized During Treatment Gait belt    Activity Tolerance Patient tolerated treatment well          Past Medical History:  Diagnosis Date   Benign enlargement of prostate    Bronchitis    Childhood asthma    Diabetes mellitus without complication (HCC)    Environmental allergies    Erectile dysfunction    Headache    Hemorrhoids    Hypercholesteremia    Hypertension    Hypogonadism in male    Lumbago    Over weight    Past Surgical History:  Procedure Laterality Date   AMPUTATION Right 05/15/2023   Procedure: AMPUTATION BELOW KNEE;  Surgeon: Marea Selinda RAMAN, MD;  Location: [facility];  Service: General;  Laterality: Right;   AMPUTATION Right 05/16/2023   Procedure: REVISION OF BELOW KNEE AMPUTATION;  Surgeon: Marea Selinda RAMAN, MD;  Location: [facility];  Service: General;  Laterality: Right;   BACK SURGERY  05/20/1990   CHOLECYSTECTOMY     COLONOSCOPY N/A 12/26/2023   Procedure: COLONOSCOPY;  Surgeon: Maryruth Ole DASEN, MD;  Location: [facility];  Service: Endoscopy;  Laterality: N/A;   COLONOSCOPY WITH PROPOFOL  N/A 08/07/2015   Procedure: COLONOSCOPY WITH PROPOFOL ;  Surgeon: Gladis RAYMOND Mariner, MD;  Location: [facility] ENDOSCOPY;  Service: Endoscopy;  Laterality: N/A;   COLONOSCOPY WITH PROPOFOL  N/A 08/08/2015   Procedure: COLONOSCOPY WITH PROPOFOL ;  Surgeon: Gladis RAYMOND Mariner, MD;  Location: [facility] ENDOSCOPY;  Service: Endoscopy;  Laterality: N/A;   COLONOSCOPY WITH PROPOFOL  N/A 09/16/2017   Procedure: COLONOSCOPY WITH PROPOFOL ;  Surgeon: Mariner Gladis RAYMOND, MD;  Location: [facility] ENDOSCOPY;   Service: Endoscopy;  Laterality: N/A;   COLONOSCOPY WITH PROPOFOL  N/A 12/20/2020   Procedure: COLONOSCOPY WITH PROPOFOL ;  Surgeon: Dessa Reyes ORN, MD;  Location: [facility] ENDOSCOPY;  Service: Endoscopy;  Laterality: N/A;  IDDM   CORONARY STENT INTERVENTION N/A 04/20/2019   Procedure: CORONARY STENT INTERVENTION;  Surgeon: Ammon Blunt, MD;  Location: [facility];  Service: Cardiovascular;  Laterality: N/A;   ESOPHAGOGASTRODUODENOSCOPY N/A 12/26/2023   Procedure: EGD (ESOPHAGOGASTRODUODENOSCOPY);  Surgeon: Maryruth Ole DASEN, MD;  Location: [facility] ENDOSCOPY;  Service: Endoscopy;  Laterality: N/A;   ESOPHAGOGASTRODUODENOSCOPY (EGD) WITH PROPOFOL  N/A 08/07/2015   Procedure: ESOPHAGOGASTRODUODENOSCOPY (EGD) WITH PROPOFOL ;  Surgeon: Gladis RAYMOND Mariner, MD;  Location: [facility] ENDOSCOPY;  Service: Endoscopy;  Laterality: N/A;   ESOPHAGOGASTRODUODENOSCOPY (EGD) WITH PROPOFOL  N/A 09/16/2017   Procedure: ESOPHAGOGASTRODUODENOSCOPY (EGD) WITH PROPOFOL ;  Surgeon: Mariner Gladis RAYMOND, MD;  Location: [facility] ENDOSCOPY;  Service: Endoscopy;  Laterality: N/A;   ESOPHAGOGASTRODUODENOSCOPY (EGD) WITH PROPOFOL  N/A 12/20/2020   Procedure: ESOPHAGOGASTRODUODENOSCOPY (EGD) WITH PROPOFOL ;  Surgeon: Dessa Reyes ORN, MD;  Location: [facility] ENDOSCOPY;  Service: Endoscopy;  Laterality: N/A;   HEMOSTASIS CLIP PLACEMENT  12/26/2023   Procedure: CONTROL OF HEMORRHAGE, GI TRACT, ENDOSCOPIC, BY CLIPPING OR OVERSEWING;  Surgeon: Maryruth Ole DASEN, MD;  Location: [facility] ENDOSCOPY;  Service: Endoscopy;;   IRRIGATION AND DEBRIDEMENT FOOT Right 05/12/2023   Procedure: IRRIGATION AND DEBRIDEMENT FOOT, WASH OUT;  Surgeon: Neill Boas,  DPM;  Location: [facility];  Service: Orthopedics/Podiatry;  Laterality: Right;   LEFT HEART CATH AND CORONARY ANGIOGRAPHY Left 04/20/2019   Procedure: LEFT HEART CATH AND CORONARY ANGIOGRAPHY;  Surgeon: Ammon Blunt, MD;  Location: [facility];  Service: Cardiovascular;  Laterality:  Left;   LOWER EXTREMITY ANGIOGRAPHY Right 05/09/2023   Procedure: Lower Extremity Angiography;  Surgeon: Jama Cordella MATSU, MD;  Location: [facility];  Service: Cardiovascular;  Laterality: Right;   POLYPECTOMY  12/26/2023   Procedure: POLYPECTOMY, INTESTINE;  Surgeon: Maryruth Ole DASEN, MD;  Location: [facility] ENDOSCOPY;  Service: Endoscopy;;   TRANSMETATARSAL AMPUTATION Right 05/08/2023   Procedure: TRANSMETATARSAL AMPUTATION;  Surgeon: Neill Boas, DPM;  Location: [facility];  Service: Orthopedics/Podiatry;  Laterality: Right;   Patient Active Problem List   Diagnosis Date Noted   DVT (deep venous thrombosis) (HCC) 02/24/2024   Acute respiratory failure with hypoxia (HCC) 01/21/2024   Saddle embolus of pulmonary artery (HCC) 01/19/2024   Hx of BKA, right (HCC) 09/02/2023   Diabetic foot infection (HCC) 05/07/2023   Sepsis (HCC) 05/07/2023   Chronic back pain 05/07/2023   Anxiety 05/07/2023   Obesity, Class III, BMI 40-49.9 (morbid obesity) (HCC) 05/07/2023   Uncontrolled type 2 diabetes mellitus with hyperglycemia, with long-term current use of insulin  (HCC) 05/07/2023   Genetic testing 04/09/2022   Chronic venous insufficiency 06/29/2020   Lymphedema 06/29/2020   PAD (peripheral artery disease) 05/30/2020   Ankle ulcer, right, with fat layer exposed (HCC) 05/30/2020   Allergy 05/29/2020   Asthma without status asthmaticus 05/29/2020   Uncontrolled type 2 diabetes mellitus with hyperglycemia (HCC) 05/29/2020   S/P coronary artery stent placement 04/30/2019   CAD S/P percutaneous coronary angioplasty 04/20/2019   Abnormal ECG 03/18/2019   Chest pain with high risk for cardiac etiology 03/18/2019   SOB (shortness of breath) on exertion 03/18/2019   Hypercholesterolemia 05/07/2015   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   Hypogonadism in male 11/22/2014   BPH with obstruction/lower urinary tract symptoms 11/22/2014   DDD (degenerative disc disease), lumbar  10/13/2014   Status post lumbar laminectomy 10/13/2014   DJD of shoulder 10/13/2014   Bilateral occipital neuralgia 10/13/2014   Lumbar radiculopathy 10/13/2014   DDD (degenerative disc disease), cervical 10/13/2014   Depression 08/25/2013   HTN (hypertension) 08/25/2013   History of hemorrhoids 08/25/2013   Low back pain 08/25/2013    PCP: Valora Lynwood FALCON, MD   REFERRING PROVIDER: Delores Orvin BRAVO, NP   REFERRING DIAG: [id number] (ICD-10-CM) - Hx of right BKA (HCC)   THERAPY DIAG:  Abnormality of gait and mobility  Difficulty in walking, not elsewhere classified  Muscle weakness (generalized)  Other abnormalities of gait and mobility  Other lack of coordination  Rationale for Evaluation and Treatment: Rehabilitation  ONSET DATE: 05/15/2023  SUBJECTIVE:   SUBJECTIVE STATEMENT: Patient returns today for gait and prosthetic training following his BKA on 05/14/2024 and BKA revision on 05/15/2024. He initially began PT treatment in August of this year following the BKA, however, he only attended 3 sessions due to having to discontinue due to blood clots in the R LE that resulted in PE. He was hospitalized for 3 days and given blood thinner which helped improve his condition. He reports he has not had any PT following the incident. He reports he has been doing a few exercises and transferring, but has not walked since he came to PT in August. Patient reports that he is unable to straighten the L knee fully  making standing/gait difficult.    PERTINENT HISTORY: Patient is a 58 year old male with R BKA on 05/15/2023 and revision of R BKA on 05/16/2023. PMH: with medical history HTN, HLD, CAD s/p stent, DM with neuropathy, chronic back pain on opiates,, anxiety.  PAIN:  Are you having pain? No  PRECAUTIONS: Fall  RED FLAGS: None   WEIGHT BEARING RESTRICTIONS: No  FALLS:  Has patient fallen in last 6 months? No  LIVING ENVIRONMENT: Lives with: lives with their spouse Lives  in: House/apartment Stairs: Ramp to entry Has following equipment at home: Vannie - 2 wheeled, Environmental Consultant - 4 wheeled, Wheelchair (manual), Graybar electric, and Ramped entry, Rollator  OCCUPATION: On disability.  PLOF: Independent  PATIENT GOALS: Patient wants to be able to improve gait and balance with gait.    NEXT MD VISIT: 05/30/2023 January 10th to check for blood clots.   OBJECTIVE:  Note: Objective measures were completed at Evaluation unless otherwise noted.  DIAGNOSTIC FINDINGS: COMPARISON:  CT pulmonary angiogram performed January 19, 2024   FINDINGS: VENOUS   Nonocclusive thrombus is apparent in the right common femoral vein. The imaged portion of the right greater saphenous vein is patent. Occlusive thrombus is identified within the right superficial femoral vein in the proximal segment. Nonocclusive thrombus is favored in the mid and distal segments. The popliteal vein is noncompressible as are the gastrocnemius/soleal veins. The patient is status post right lower extremity amputation and therefore the tibial veins were not evaluated.   The left common femoral, femoral, and popliteal veins are patent. There is no evidence of DVT within the tibial veins on the left.   OTHER   None.   Limitations: none   IMPRESSION: 1. Extensive DVT in the right lower extremity which involves the popliteal, femoral, and common femoral veins. 2. No evidence of left lower extremity DVT.   These results will be called to the ordering clinician or representative by the Radiologist Assistant, and communication documented in the PACS or [employer].     Electronically Signed   By: Maude Naegeli M.D.   On: 01/19/2024 12:29  PATIENT SURVEYS:  LEFS  Extreme difficulty/unable (0), Quite a bit of difficulty (1), Moderate difficulty (2), Little difficulty (3), No difficulty (4) Survey date:    Any of your usual work, housework or school activities 1  2. Usual hobbies,  recreational or sporting activities 0  3. Getting into/out of the bath 1  4. Walking between rooms 0  5. Putting on socks/shoes 1  6. Squatting  0  7. Lifting an object, like a bag of groceries from the floor 1  8. Performing light activities around your home 1  9. Performing heavy activities around your home 0  10. Getting into/out of a car 1  11. Walking 2 blocks 0  12. Walking 1 mile 0  13. Going up/down 10 stairs (1 flight) 0  14. Standing for 1 hour 0  15.  sitting for 1 hour 4  16. Running on even ground 0  17. Running on uneven ground 0  18. Making sharp turns while running fast 0  19. Hopping  2  20. Rolling over in bed 1  Score total:  13/80     COGNITION: Overall cognitive status: Within functional limits for tasks assessed     SENSATION: Slight decrease in sensation to anterior R tibia.      POSTURE: rounded shoulders, forward head, and increased thoracic kyphosis  PALPATION: No pain with palpation to  R distal stump.   LOWER EXTREMITY ROM:  Active ROM Right eval Left eval  Hip flexion    Hip extension    Hip abduction    Hip adduction    Hip internal rotation    Hip external rotation    Knee flexion    Knee extension 9   Ankle dorsiflexion    Ankle plantarflexion    Ankle inversion    Ankle eversion     (Blank rows = not tested)  LOWER EXTREMITY MMT:  MMT Right eval Left eval  Hip flexion 4+ 4  Hip extension 4+ 4+  Hip abduction 4+ 5  Hip adduction    Hip internal rotation    Hip external rotation    Knee flexion 5 4+  Knee extension 5 4+  Ankle dorsiflexion    Ankle plantarflexion    Ankle inversion    Ankle eversion     (Blank rows = not tested)    FUNCTIONAL TESTS:  Timed up and go (TUG): Not safe to perform at evaluation. 10 meter walk test: Not safe to perform at evaluation.  GAIT: Distance walked: 42' x2 once using // bars and another using FWW.  Assistive device utilized: Environmental Consultant - 2 wheeled and // bars.  Level of  assistance: CGA Comments: CGA for safety, decreased stride length, decreased gait speed, lack of R knee extension resulting in lack of R heel strike.                                                                                                                                 TREATMENT DATE: 04/21/2024    PATIENT EDUCATION:  Education details: POC, HEP, safety with gait/AD at home.  Person educated: Patient Education method: Explanation, Demonstration, and Handouts Education comprehension: verbalized understanding  HOME EXERCISE PROGRAM: Access Code: [email or web address] URL: [email or web address] Date: 04/21/2024 Prepared by: Norman Sharps  Exercises - Long Sitting Quad Set  - 2 x daily - 1 x weekly - 3 sets - 10 reps - 5 seconds hold - Seated Hamstring Stretch with Chair  - 2 x daily - 1 x weekly - 1 sets - 5 reps - 30 second hold - Prone Knee Extension Hang  - 1 x daily - 7 x weekly - 1 sets - 3 reps - 5 minute hold  ASSESSMENT:  CLINICAL IMPRESSION: Patient is a 58 y.o. male who was seen today for physical therapy evaluation and treatment for prosthetic/gait training following R BKA performed on December of 2024. Pt initially attended PT in in August of this year, but had to discontinue due to PE. He now has been cleared to continue with PT treatment following the incident. Objectively, patient demonstrated decreased R knee extension ROM, decreased LE strength, decreased balance, decreased gait speed, difficulty walking with poor gait mechanics, and decreased activity tolerance.  Patient is a good candidate for skilled PT treatment 2x/week for improvement on deficits listed above.  OBJECTIVE IMPAIRMENTS: Abnormal gait, decreased activity tolerance, decreased balance, decreased coordination, decreased endurance, difficulty walking, decreased ROM, and decreased strength.   ACTIVITY LIMITATIONS: carrying, lifting, bending, standing, squatting, stairs, and  transfers  PARTICIPATION LIMITATIONS: meal prep, cleaning, laundry, driving, shopping, community activity, and yard work  PERSONAL FACTORS: Time since onset of injury/illness/exacerbation and 1-2 comorbidities: HTN, HLD, CAD, s/p stent, DM, Chronic LBP, Obesity. are also affecting patient's functional outcome.   REHAB POTENTIAL: Good  CLINICAL DECISION MAKING: Stable/uncomplicated  EVALUATION COMPLEXITY: Moderate   GOALS: Goals reviewed with patient? Yes  SHORT TERM GOALS: Target date:  05/19/2024 Baseline: Goal status: INITIAL    LONG TERM GOALS: Target date: 07/14/2024  1.  Patient will improve R knee extension to neutral (0) degrees by discharge allowing improved gait mechanics.  Baseline: 9 degrees from full extension.  Goal status: INITIAL  2.  Patient will improve LEFS outcome score by 12 points or higher by discharge allowing him to perform ADLs with less difficulty.  Baseline: 13/80 Goal status: INITIAL   3.  Patient will reduce timed up and go to <11 seconds to reduce fall risk and demonstrate improved transfer/gait ability. Baseline: Did not perform at evaluation due to decreased safety.  Goal status: INITIAL  4.  Patient will increase 10 meter walk test to >1.51m/s as to improve gait speed for better community ambulation and to reduce fall risk. Baseline: Did not perform at evaluation due to decreased safety.  Goal status: INITIAL  5.  Patient will ambulated > 500 feet with least restrictive AD on all surfaces with modified independence  for improved normalized gait pattern with less chance of fatigue/LBP.  Baseline: EVAL- 20'' in // bars with CGA.  Goal status: INITIAL   PLAN:  PT FREQUENCY: 1-2x/week  PT DURATION: 12 weeks  PLANNED INTERVENTIONS: [id number]- PT Re-evaluation, 97750- Physical Performance Testing, 97110-Therapeutic exercises, 97530- Therapeutic activity, [id number]- Neuromuscular re-education, 97535- Self Care, [id number]- Manual therapy, [phone]- Gait  training, [phone]- Prosthetic Initial , [phone]- Orthotic/Prosthetic subsequent, Patient/Family education, Balance training, Stair training, DME instructions, and Wheelchair mobility training  PLAN FOR NEXT SESSION:  Work to improve R knee extension ROM.  Continue working to improve standing balance/gait/tolerance to weight bearing through R LE.   Norman KATHEE Sharps, PT 04/21/2024, 5:20 PM

## 2024-04-22 ENCOUNTER — Encounter

## 2024-04-26 ENCOUNTER — Ambulatory Visit

## 2024-04-26 DIAGNOSIS — R2689 Other abnormalities of gait and mobility: Secondary | ICD-10-CM

## 2024-04-26 DIAGNOSIS — R269 Unspecified abnormalities of gait and mobility: Secondary | ICD-10-CM

## 2024-04-26 DIAGNOSIS — R262 Difficulty in walking, not elsewhere classified: Secondary | ICD-10-CM

## 2024-04-26 DIAGNOSIS — M6281 Muscle weakness (generalized): Secondary | ICD-10-CM

## 2024-04-26 NOTE — Therapy (Signed)
 OUTPATIENT PHYSICAL THERAPY LOWER EXTREMITY EVALUATION   Patient Name: Gerald Ellis MRN: [id number] DOB:1966/05/09, 58 y.o., male Today's Date: 04/26/2024  END OF SESSION:  PT End of Session - 04/26/24 1530     Visit Number 2    Number of Visits 25    Date for Recertification  07/14/24    PT Start Time 1535    PT Stop Time 1617    PT Time Calculation (min) 42 min    Equipment Utilized During Treatment Gait belt    Activity Tolerance Patient tolerated treatment well          Past Medical History:  Diagnosis Date   Benign enlargement of prostate    Bronchitis    Childhood asthma    Diabetes mellitus without complication (HCC)    Environmental allergies    Erectile dysfunction    Headache    Hemorrhoids    Hypercholesteremia    Hypertension    Hypogonadism in male    Lumbago    Over weight    Past Surgical History:  Procedure Laterality Date   AMPUTATION Right 05/15/2023   Procedure: AMPUTATION BELOW KNEE;  Surgeon: Marea Selinda RAMAN, MD;  Location: [facility];  Service: General;  Laterality: Right;   AMPUTATION Right 05/16/2023   Procedure: REVISION OF BELOW KNEE AMPUTATION;  Surgeon: Marea Selinda RAMAN, MD;  Location: [facility];  Service: General;  Laterality: Right;   BACK SURGERY  05/20/1990   CHOLECYSTECTOMY     COLONOSCOPY N/A 12/26/2023   Procedure: COLONOSCOPY;  Surgeon: Maryruth Ole DASEN, MD;  Location: [facility];  Service: Endoscopy;  Laterality: N/A;   COLONOSCOPY WITH PROPOFOL  N/A 08/07/2015   Procedure: COLONOSCOPY WITH PROPOFOL ;  Surgeon: Gladis RAYMOND Mariner, MD;  Location: [facility] ENDOSCOPY;  Service: Endoscopy;  Laterality: N/A;   COLONOSCOPY WITH PROPOFOL  N/A 08/08/2015   Procedure: COLONOSCOPY WITH PROPOFOL ;  Surgeon: Gladis RAYMOND Mariner, MD;  Location: [facility] ENDOSCOPY;  Service: Endoscopy;  Laterality: N/A;   COLONOSCOPY WITH PROPOFOL  N/A 09/16/2017   Procedure: COLONOSCOPY WITH PROPOFOL ;  Surgeon: Mariner Gladis RAYMOND, MD;  Location: [facility] ENDOSCOPY;   Service: Endoscopy;  Laterality: N/A;   COLONOSCOPY WITH PROPOFOL  N/A 12/20/2020   Procedure: COLONOSCOPY WITH PROPOFOL ;  Surgeon: Dessa Reyes ORN, MD;  Location: [facility]OS[facility];  Service: Endoscopy;  Laterality: N/A;  IDDM   CORONARY STENT INTERVENTION N/A 04/20/2019   Procedure: CORONARY STENT INTERVENTION;  Surgeon: Ammon Blunt, MD;  Location: [facility];  Service: Cardiovascular;  Laterality: N/A;   ESOPHAGOGASTRODUODENOSCOPY N/A 12/26/2023   Procedure: EGD (ESOPHAGOGASTRODUODENOSCOPY);  Surgeon: Maryruth Ole DASEN, MD;  Location: [facility] ENDOSCOPY;  Service: Endoscopy;  Laterality: N/A;   ESOPHAGOGASTRODUODENOSCOPY (EGD) WITH PROPOFOL  N/A 08/07/2015   Procedure: ESOPHAGOGASTRODUODENOSCOPY (EGD) WITH PROPOFOL ;  Surgeon: Gladis RAYMOND Mariner, MD;  Location: [facility] ENDOSCOPY;  Service: Endoscopy;  Laterality: N/A;   ESOPHAGOGASTRODUODENOSCOPY (EGD) WITH PROPOFOL  N/A 09/16/2017   Procedure: ESOPHAGOGASTRODUODENOSCOPY (EGD) WITH PROPOFOL ;  Surgeon: Mariner Gladis RAYMOND, MD;  Location: [facility] ENDOSCOPY;  Service: Endoscopy;  Laterality: N/A;   ESOPHAGOGASTRODUODENOSCOPY (EGD) WITH PROPOFOL  N/A 12/20/2020   Procedure: ESOPHAGOGASTRODUODENOSCOPY (EGD) WITH PROPOFOL ;  Surgeon: Dessa Reyes ORN, MD;  Location: [facility] ENDOSCOPY;  Service: Endoscopy;  Laterality: N/A;   HEMOSTASIS CLIP PLACEMENT  12/26/2023   Procedure: CONTROL OF HEMORRHAGE, GI TRACT, ENDOSCOPIC, BY CLIPPING OR OVERSEWING;  Surgeon: Maryruth Ole DASEN, MD;  Location: [facility] ENDOS[facility];  Service: Endoscopy;;   IRRIGATION AND DEBRIDEMENT FOOT Right 05/12/2023   Procedure: IRRIGATION AND DEBRIDEMENT FOOT, WASH OUT;  Surgeon: Neill Boas,  DPM;  Location: [facility];  Service: Orthopedics/Podiatry;  Laterality: Right;   LEFT HEART CATH AND CORONARY ANGIOGRAPHY Left 04/20/2019   Procedure: LEFT HEART CATH AND CORONARY ANGIOGRAPHY;  Surgeon: Ammon Blunt, MD;  Location: [facility];  Service: Cardiovascular;  Laterality:  Left;   LOWER EXTREMITY ANGIOGRAPHY Right 05/09/2023   Procedure: Lower Extremity Angiography;  Surgeon: Jama Cordella MATSU, MD;  Location: [facility];  Service: Cardiovascular;  Laterality: Right;   POLYPECTOMY  12/26/2023   Procedure: POLYPECTOMY, INTESTINE;  Surgeon: Maryruth Ole DASEN, MD;  Location: [facility] ENDOS[facility];  Service: Endoscopy;;   TRANSMETATARSAL AMPUTATION Right 05/08/2023   Procedure: TRANSMETATARSAL AMPUTATION;  Surgeon: Neill Boas, DPM;  Location: [facility];  Service: Orthopedics/Podiatry;  Laterality: Right;   Patient Active Problem List   Diagnosis Date Noted   DVT (deep venous thrombosis) (HCC) 02/24/2024   Acute respiratory failure with hypoxia (HCC) 01/21/2024   Saddle embolus of pulmonary artery (HCC) 01/19/2024   Hx of BKA, right (HCC) 09/02/2023   Diabetic foot infection (HCC) 05/07/2023   Sepsis (HCC) 05/07/2023   Chronic back pain 05/07/2023   Anxiety 05/07/2023   Obesity, Class III, BMI 40-49.9 (morbid obesity) (HCC) 05/07/2023   Uncontrolled type 2 diabetes mellitus with hyperglycemia, with long-term current use of insulin  (HCC) 05/07/2023   Genetic testing 04/09/2022   Chronic venous insufficiency 06/29/2020   Lymphedema 06/29/2020   PAD (peripheral artery disease) 05/30/2020   Ankle ulcer, right, with fat layer exposed (HCC) 05/30/2020   Allergy 05/29/2020   Asthma without status asthmaticus 05/29/2020   Uncontrolled type 2 diabetes mellitus with hyperglycemia (HCC) 05/29/2020   S/P coronary artery stent placement 04/30/2019   CAD S/P percutaneous coronary angioplasty 04/20/2019   Abnormal ECG 03/18/2019   Chest pain with high risk for cardiac etiology 03/18/2019   SOB (shortness of breath) on exertion 03/18/2019   Hypercholesterolemia 05/07/2015   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   Hypogonadism in male 11/22/2014   BPH with obstruction/lower urinary tract symptoms 11/22/2014   DDD (degenerative disc disease), lumbar  10/13/2014   Status post lumbar laminectomy 10/13/2014   DJD of shoulder 10/13/2014   Bilateral occipital neuralgia 10/13/2014   Lumbar radiculopathy 10/13/2014   DDD (degenerative disc disease), cervical 10/13/2014   Depression 08/25/2013   HTN (hypertension) 08/25/2013   History of hemorrhoids 08/25/2013   Low back pain 08/25/2013    PCP: Valora Lynwood FALCON, MD   REFERRING PROVIDER: Delores Orvin BRAVO, NP   REFERRING DIAG: [phone] (ICD-10-CM) - Hx of right BKA (HCC)   THERAPY DIAG:  Abnormality of gait and mobility  Difficulty in walking, not elsewhere classified  Muscle weakness (generalized)  Other abnormalities of gait and mobility  Rationale for Evaluation and Treatment: Rehabilitation  ONSET DATE: 05/15/2023  SUBJECTIVE:   SUBJECTIVE STATEMENT: Patient reports doing good today with no significant changes since evaluation.    PERTINENT HISTORY: Patient is a 58 year old male with R BKA on 05/15/2023 and revision of R BKA on 05/16/2023. PMH: with medical history HTN, HLD, CAD s/p stent, DM with neuropathy, chronic back pain on opiates,, anxiety.  PAIN:  Are you having pain? No  PRECAUTIONS: Fall  RED FLAGS: None   WEIGHT BEARING RESTRICTIONS: No  FALLS:  Has patient fallen in last 6 months? No  LIVING ENVIRONMENT: Lives with: lives with their spouse Lives in: House/apartment Stairs: Ramp to entry Has following equipment at home: Vannie - 2 wheeled, Environmental Consultant - 4 wheeled, Wheelchair (manual), Graybar electric, and  Ramped entry, Rollator  OCCUPATION: On disability.  PLOF: Independent  PATIENT GOALS: Patient wants to be able to improve gait and balance with gait.    NEXT MD VISIT: 05/30/2023 January 10th to check for blood clots.   OBJECTIVE:  Note: Objective measures were completed at Evaluation unless otherwise noted.  DIAGNOSTIC FINDINGS: COMPARISON:  CT pulmonary angiogram performed January 19, 2024   FINDINGS: VENOUS   Nonocclusive thrombus is  apparent in the right common femoral vein. The imaged portion of the right greater saphenous vein is patent. Occlusive thrombus is identified within the right superficial femoral vein in the proximal segment. Nonocclusive thrombus is favored in the mid and distal segments. The popliteal vein is noncompressible as are the gastrocnemius/soleal veins. The patient is status post right lower extremity amputation and therefore the tibial veins were not evaluated.   The left common femoral, femoral, and popliteal veins are patent. There is no evidence of DVT within the tibial veins on the left.   OTHER   None.   Limitations: none   IMPRESSION: 1. Extensive DVT in the right lower extremity which involves the popliteal, femoral, and common femoral veins. 2. No evidence of left lower extremity DVT.   These results will be called to the ordering clinician or representative by the Radiologist Assistant, and communication documented in the PACS or [employer].     Electronically Signed   By: Maude Naegeli M.D.   On: 01/19/2024 12:29  PATIENT SURVEYS:  LEFS  Extreme difficulty/unable (0), Quite a bit of difficulty (1), Moderate difficulty (2), Little difficulty (3), No difficulty (4) Survey date:    Any of your usual work, housework or school activities 1  2. Usual hobbies, recreational or sporting activities 0  3. Getting into/out of the bath 1  4. Walking between rooms 0  5. Putting on socks/shoes 1  6. Squatting  0  7. Lifting an object, like a bag of groceries from the floor 1  8. Performing light activities around your home 1  9. Performing heavy activities around your home 0  10. Getting into/out of a car 1  11. Walking 2 blocks 0  12. Walking 1 mile 0  13. Going up/down 10 stairs (1 flight) 0  14. Standing for 1 hour 0  15.  sitting for 1 hour 4  16. Running on even ground 0  17. Running on uneven ground 0  18. Making sharp turns while running fast 0  19. Hopping  2   20. Rolling over in bed 1  Score total:  13/80     COGNITION: Overall cognitive status: Within functional limits for tasks assessed     SENSATION: Slight decrease in sensation to anterior R tibia.      POSTURE: rounded shoulders, forward head, and increased thoracic kyphosis  PALPATION: No pain with palpation to R distal stump.   LOWER EXTREMITY ROM:  Active ROM Right eval Left eval  Hip flexion    Hip extension    Hip abduction    Hip adduction    Hip internal rotation    Hip external rotation    Knee flexion    Knee extension 9   Ankle dorsiflexion    Ankle plantarflexion    Ankle inversion    Ankle eversion     (Blank rows = not tested)  LOWER EXTREMITY MMT:  MMT Right eval Left eval  Hip flexion 4+ 4  Hip extension 4+ 4+  Hip abduction 4+ 5  Hip  adduction    Hip internal rotation    Hip external rotation    Knee flexion 5 4+  Knee extension 5 4+  Ankle dorsiflexion    Ankle plantarflexion    Ankle inversion    Ankle eversion     (Blank rows = not tested)    FUNCTIONAL TESTS:  Timed up and go (TUG): Not safe to perform at evaluation. 10 meter walk test: Not safe to perform at evaluation.  GAIT: Distance walked: 17' x2 once using // bars and another using FWW.  Assistive device utilized: Environmental Consultant - 2 wheeled and // bars.  Level of assistance: CGA Comments: CGA for safety, decreased stride length, decreased gait speed, lack of R knee extension resulting in lack of R heel strike.                                                                                                                                 TREATMENT DATE: 04/21/2024   Seated HS stretch with prosthetic on mat. 3x 2'.   Standing weight shifts in // bars with visual cuing in mirror. X30  Seated LAQ 2x10 with prothsetic donned.   GT in // bars:   -Trial 1: 1 lap.   -Trial 2: 2 laps.   -Trial 3: 1 lap with FWW CGA for safety.  -Trial 4: 29.1' with FWW CGA for safety.    -Trial 5:28' with FWW CGA for safety.   PATIENT EDUCATION:  Education details: POC, HEP, safety with gait/AD at home.  Person educated: Patient Education method: Explanation, Demonstration, and Handouts Education comprehension: verbalized understanding  HOME EXERCISE PROGRAM: Access Code: [email or web address] URL: [email or web address] Date: 04/21/2024 Prepared by: Norman Sharps  Exercises - Long Sitting Quad Set  - 2 x daily - 1 x weekly - 3 sets - 10 reps - 5 seconds hold - Seated Hamstring Stretch with Chair  - 2 x daily - 1 x weekly - 1 sets - 5 reps - 30 second hold - Prone Knee Extension Hang  - 1 x daily - 7 x weekly - 1 sets - 3 reps - 5 minute hold  ASSESSMENT:  CLINICAL IMPRESSION: Pt began today's treatment with seated hamstring stretch with prosthetic donned and resting on chair to improve R LE extension ROM. He then performed weight shifts at // bars in front of mirror for visual cuing x30. Pt initial reported some mile pain in distal stump, but it improved with time. LAQs seated in WC performed 2x10 for increased R quad strength. Patient performed multiple gait trials today first in // bars using the bars transitioning to FWW in // bars for safety and finally using the FWW outside of the bars. He showed good control and balance with gait using FWW ambulating >25' x2 with CGA for safety. Patient has decreased stride length, esp. With R and decreased heel strike due to inability to fully extend R knee. He was also noted to get  fatigued with gait and the R knee buckled on 4th trial terminating that trial until he was rested enough for the next. Overall, patient continues to be very motivated to reach goals and follows cues well for improved performance during PT treatment session.  Patient is a good candidate for skilled PT treatment 2x/week for improvement on deficits listed above.    OBJECTIVE IMPAIRMENTS: Abnormal gait, decreased activity tolerance, decreased balance,  decreased coordination, decreased endurance, difficulty walking, decreased ROM, and decreased strength.   ACTIVITY LIMITATIONS: carrying, lifting, bending, standing, squatting, stairs, and transfers  PARTICIPATION LIMITATIONS: meal prep, cleaning, laundry, driving, shopping, community activity, and yard work  PERSONAL FACTORS: Time since onset of injury/illness/exacerbation and 1-2 comorbidities: HTN, HLD, CAD, s/p stent, DM, Chronic LBP, Obesity. are also affecting patient's functional outcome.   REHAB POTENTIAL: Good  CLINICAL DECISION MAKING: Stable/uncomplicated  EVALUATION COMPLEXITY: Moderate   GOALS: Goals reviewed with patient? Yes  SHORT TERM GOALS: Target date:  05/19/2024 Baseline: Goal status: INITIAL    LONG TERM GOALS: Target date: 07/14/2024  1.  Patient will improve R knee extension to neutral (0) degrees by discharge allowing improved gait mechanics.  Baseline: 9 degrees from full extension.  Goal status: INITIAL  2.  Patient will improve LEFS outcome score by 12 points or higher by discharge allowing him to perform ADLs with less difficulty.  Baseline: 13/80 Goal status: INITIAL   3.  Patient will reduce timed up and go to <11 seconds to reduce fall risk and demonstrate improved transfer/gait ability. Baseline: Did not perform at evaluation due to decreased safety.  Goal status: INITIAL  4.  Patient will increase 10 meter walk test to >1.41m/s as to improve gait speed for better community ambulation and to reduce fall risk. Baseline: Did not perform at evaluation due to decreased safety.  Goal status: INITIAL  5.  Patient will ambulated > 500 feet with least restrictive AD on all surfaces with modified independence  for improved normalized gait pattern with less chance of fatigue/LBP.  Baseline: EVAL- 20'' in // bars with CGA.  Goal status: INITIAL   PLAN:  PT FREQUENCY: 1-2x/week  PT DURATION: 12 weeks  PLANNED INTERVENTIONS: [id number]- PT  Re-evaluation, 97750- Physical Performance Testing, 97110-Therapeutic exercises, 97530- Therapeutic activity, [id number]- Neuromuscular re-education, 97535- Self Care, [id number]- Manual therapy, [phone]- Gait training, [phone]- Prosthetic Initial , [phone]- Orthotic/Prosthetic subsequent, Patient/Family education, Balance training, Stair training, DME instructions, and Wheelchair mobility training  PLAN FOR NEXT SESSION:  Work to improve R knee extension ROM.  Continue working to improve standing balance/gait/tolerance to weight bearing through R LE.   Norman KATHEE Sharps, PT 04/26/2024, 4:33 PM

## 2024-04-27 ENCOUNTER — Encounter

## 2024-04-28 ENCOUNTER — Ambulatory Visit

## 2024-04-28 DIAGNOSIS — I10 Essential (primary) hypertension: Secondary | ICD-10-CM | POA: Diagnosis not present

## 2024-04-28 DIAGNOSIS — R262 Difficulty in walking, not elsewhere classified: Secondary | ICD-10-CM

## 2024-04-28 DIAGNOSIS — R2689 Other abnormalities of gait and mobility: Secondary | ICD-10-CM

## 2024-04-28 DIAGNOSIS — R278 Other lack of coordination: Secondary | ICD-10-CM

## 2024-04-28 DIAGNOSIS — R269 Unspecified abnormalities of gait and mobility: Secondary | ICD-10-CM

## 2024-04-28 DIAGNOSIS — Z794 Long term (current) use of insulin: Secondary | ICD-10-CM | POA: Diagnosis not present

## 2024-04-28 DIAGNOSIS — Z89511 Acquired absence of right leg below knee: Secondary | ICD-10-CM | POA: Diagnosis not present

## 2024-04-28 DIAGNOSIS — E7800 Pure hypercholesterolemia, unspecified: Secondary | ICD-10-CM | POA: Diagnosis not present

## 2024-04-28 DIAGNOSIS — Z955 Presence of coronary angioplasty implant and graft: Secondary | ICD-10-CM | POA: Diagnosis not present

## 2024-04-28 DIAGNOSIS — M6281 Muscle weakness (generalized): Secondary | ICD-10-CM

## 2024-04-28 DIAGNOSIS — I25118 Atherosclerotic heart disease of native coronary artery with other forms of angina pectoris: Secondary | ICD-10-CM | POA: Diagnosis not present

## 2024-04-28 DIAGNOSIS — E119 Type 2 diabetes mellitus without complications: Secondary | ICD-10-CM | POA: Diagnosis not present

## 2024-04-28 NOTE — Therapy (Signed)
 OUTPATIENT PHYSICAL THERAPY LOWER EXTREMITY EVALUATION   Patient Name: Gerald Ellis MRN: [id number] DOB:11/29/1965, 58 y.o., male Today's Date: 04/28/2024  END OF SESSION:  PT End of Session - 04/28/24 1615     Visit Number 3    Number of Visits 25    Date for Recertification  07/14/24    PT Start Time 1615    Equipment Utilized During Treatment Gait belt    Activity Tolerance Patient tolerated treatment well          Past Medical History:  Diagnosis Date   Benign enlargement of prostate    Bronchitis    Childhood asthma    Diabetes mellitus without complication (HCC)    Environmental allergies    Erectile dysfunction    Headache    Hemorrhoids    Hypercholesteremia    Hypertension    Hypogonadism in male    Lumbago    Over weight    Past Surgical History:  Procedure Laterality Date   AMPUTATION Right 05/15/2023   Procedure: AMPUTATION BELOW KNEE;  Surgeon: Marea Selinda RAMAN, MD;  Location: [facility];  Service: General;  Laterality: Right;   AMPUTATION Right 05/16/2023   Procedure: REVISION OF BELOW KNEE AMPUTATION;  Surgeon: Marea Selinda RAMAN, MD;  Location: [facility];  Service: General;  Laterality: Right;   BACK SURGERY  05/20/1990   CHOLECYSTECTOMY     COLONOSCOPY N/A 12/26/2023   Procedure: COLONOSCOPY;  Surgeon: Maryruth Ole DASEN, MD;  Location: [facility];  Service: Endoscopy;  Laterality: N/A;   COLONOSCOPY WITH PROPOFOL  N/A 08/07/2015   Procedure: COLONOSCOPY WITH PROPOFOL ;  Surgeon: Gladis RAYMOND Mariner, MD;  Location: [facility] ENDOSCOPY;  Service: Endoscopy;  Laterality: N/A;   COLONOSCOPY WITH PROPOFOL  N/A 08/08/2015   Procedure: COLONOSCOPY WITH PROPOFOL ;  Surgeon: Gladis RAYMOND Mariner, MD;  Location: [facility] ENDOSCOPY;  Service: Endoscopy;  Laterality: N/A;   COLONOSCOPY WITH PROPOFOL  N/A 09/16/2017   Procedure: COLONOSCOPY WITH PROPOFOL ;  Surgeon: Mariner Gladis RAYMOND, MD;  Location: [facility] ENDOSCOPY;  Service: Endoscopy;  Laterality: N/A;   COLONOSCOPY WITH  PROPOFOL  N/A 12/20/2020   Procedure: COLONOSCOPY WITH PROPOFOL ;  Surgeon: Dessa Reyes ORN, MD;  Location: [facility]OSCOPY;  Service: Endoscopy;  Laterality: N/A;  IDDM   CORONARY STENT INTERVENTION N/A 04/20/2019   Procedure: CORONARY STENT INTERVENTION;  Surgeon: Ammon Blunt, MD;  Location: [facility];  Service: Cardiovascular;  Laterality: N/A;   ESOPHAGOGASTRODUODENOSCOPY N/A 12/26/2023   Procedure: EGD (ESOPHAGOGASTRODUODENOSCOPY);  Surgeon: Maryruth Ole DASEN, MD;  Location: [facility] ENDOSCOPY;  Service: Endoscopy;  Laterality: N/A;   ESOPHAGOGASTRODUODENOSCOPY (EGD) WITH PROPOFOL  N/A 08/07/2015   Procedure: ESOPHAGOGASTRODUODENOSCOPY (EGD) WITH PROPOFOL ;  Surgeon: Gladis RAYMOND Mariner, MD;  Location: [facility] ENDOSCOPY;  Service: Endoscopy;  Laterality: N/A;   ESOPHAGOGASTRODUODENOSCOPY (EGD) WITH PROPOFOL  N/A 09/16/2017   Procedure: ESOPHAGOGASTRODUODENOSCOPY (EGD) WITH PROPOFOL ;  Surgeon: Mariner Gladis RAYMOND, MD;  Location: [facility] ENDOSCOPY;  Service: Endoscopy;  Laterality: N/A;   ESOPHAGOGASTRODUODENOSCOPY (EGD) WITH PROPOFOL  N/A 12/20/2020   Procedure: ESOPHAGOGASTRODUODENOSCOPY (EGD) WITH PROPOFOL ;  Surgeon: Dessa Reyes ORN, MD;  Location: [facility] ENDOSCOPY;  Service: Endoscopy;  Laterality: N/A;   HEMOSTASIS CLIP PLACEMENT  12/26/2023   Procedure: CONTROL OF HEMORRHAGE, GI TRACT, ENDOSCOPIC, BY CLIPPING OR OVERSEWING;  Surgeon: Maryruth Ole DASEN, MD;  Location: [facility] ENDOSCOPY;  Service: Endoscopy;;   IRRIGATION AND DEBRIDEMENT FOOT Right 05/12/2023   Procedure: IRRIGATION AND DEBRIDEMENT FOOT, WASH OUT;  Surgeon: Neill Boas, DPM;  Location: [facility];  Service: Orthopedics/Podiatry;  Laterality: Right;   LEFT HEART CATH  AND CORONARY ANGIOGRAPHY Left 04/20/2019   Procedure: LEFT HEART CATH AND CORONARY ANGIOGRAPHY;  Surgeon: Ammon Blunt, MD;  Location: [facility];  Service: Cardiovascular;  Laterality: Left;   LOWER EXTREMITY ANGIOGRAPHY Right 05/09/2023    Procedure: Lower Extremity Angiography;  Surgeon: Jama Cordella MATSU, MD;  Location: [facility];  Service: Cardiovascular;  Laterality: Right;   POLYPECTOMY  12/26/2023   Procedure: POLYPECTOMY, INTESTINE;  Surgeon: Maryruth Ole DASEN, MD;  Location: [facility] ENDOS[facility];  Service: Endoscopy;;   TRANSMETATARSAL AMPUTATION Right 05/08/2023   Procedure: TRANSMETATARSAL AMPUTATION;  Surgeon: Neill Boas, DPM;  Location: [facility];  Service: Orthopedics/Podiatry;  Laterality: Right;   Patient Active Problem List   Diagnosis Date Noted   DVT (deep venous thrombosis) (HCC) 02/24/2024   Acute respiratory failure with hypoxia (HCC) 01/21/2024   Saddle embolus of pulmonary artery (HCC) 01/19/2024   Hx of BKA, right (HCC) 09/02/2023   Diabetic foot infection (HCC) 05/07/2023   Sepsis (HCC) 05/07/2023   Chronic back pain 05/07/2023   Anxiety 05/07/2023   Obesity, Class III, BMI 40-49.9 (morbid obesity) (HCC) 05/07/2023   Uncontrolled type 2 diabetes mellitus with hyperglycemia, with long-term current use of insulin  (HCC) 05/07/2023   Genetic testing 04/09/2022   Chronic venous insufficiency 06/29/2020   Lymphedema 06/29/2020   PAD (peripheral artery disease) 05/30/2020   Ankle ulcer, right, with fat layer exposed (HCC) 05/30/2020   Allergy 05/29/2020   Asthma without status asthmaticus 05/29/2020   Uncontrolled type 2 diabetes mellitus with hyperglycemia (HCC) 05/29/2020   S/P coronary artery stent placement 04/30/2019   CAD S/P percutaneous coronary angioplasty 04/20/2019   Abnormal ECG 03/18/2019   Chest pain with high risk for cardiac etiology 03/18/2019   SOB (shortness of breath) on exertion 03/18/2019   Hypercholesterolemia 05/07/2015   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   Hypogonadism in male 11/22/2014   BPH with obstruction/lower urinary tract symptoms 11/22/2014   DDD (degenerative disc disease), lumbar 10/13/2014   Status post lumbar laminectomy 10/13/2014    DJD of shoulder 10/13/2014   Bilateral occipital neuralgia 10/13/2014   Lumbar radiculopathy 10/13/2014   DDD (degenerative disc disease), cervical 10/13/2014   Depression 08/25/2013   HTN (hypertension) 08/25/2013   History of hemorrhoids 08/25/2013   Low back pain 08/25/2013    PCP: Valora Lynwood FALCON, MD   REFERRING PROVIDER: Delores Orvin BRAVO, NP   REFERRING DIAG: [phone] (ICD-10-CM) - Hx of right BKA (HCC)   THERAPY DIAG:  No diagnosis found.  Rationale for Evaluation and Treatment: Rehabilitation  ONSET DATE: 05/15/2023  SUBJECTIVE:   SUBJECTIVE STATEMENT: Patient reports doing good today with no significant changes since evaluation.    PERTINENT HISTORY: Patient is a 58 year old male with R BKA on 05/15/2023 and revision of R BKA on 05/16/2023. PMH: with medical history HTN, HLD, CAD s/p stent, DM with neuropathy, chronic back pain on opiates,, anxiety.  PAIN:  Are you having pain? No  PRECAUTIONS: Fall  RED FLAGS: None   WEIGHT BEARING RESTRICTIONS: No  FALLS:  Has patient fallen in last 6 months? No  LIVING ENVIRONMENT: Lives with: lives with their spouse Lives in: House/apartment Stairs: Ramp to entry Has following equipment at home: Vannie - 2 wheeled, Environmental Consultant - 4 wheeled, Wheelchair (manual), Graybar electric, and Ramped entry, Rollator  OCCUPATION: On disability.  PLOF: Independent  PATIENT GOALS: Patient wants to be able to improve gait and balance with gait.    NEXT MD VISIT: 05/30/2023 January 10th to check  for blood clots.   OBJECTIVE:  Note: Objective measures were completed at Evaluation unless otherwise noted.  DIAGNOSTIC FINDINGS: COMPARISON:  CT pulmonary angiogram performed January 19, 2024   FINDINGS: VENOUS   Nonocclusive thrombus is apparent in the right common femoral vein. The imaged portion of the right greater saphenous vein is patent. Occlusive thrombus is identified within the right superficial femoral vein in the proximal  segment. Nonocclusive thrombus is favored in the mid and distal segments. The popliteal vein is noncompressible as are the gastrocnemius/soleal veins. The patient is status post right lower extremity amputation and therefore the tibial veins were not evaluated.   The left common femoral, femoral, and popliteal veins are patent. There is no evidence of DVT within the tibial veins on the left.   OTHER   None.   Limitations: none   IMPRESSION: 1. Extensive DVT in the right lower extremity which involves the popliteal, femoral, and common femoral veins. 2. No evidence of left lower extremity DVT.   These results will be called to the ordering clinician or representative by the Radiologist Assistant, and communication documented in the PACS or [employer].     Electronically Signed   By: Maude Naegeli M.D.   On: 01/19/2024 12:29  PATIENT SURVEYS:  LEFS  Extreme difficulty/unable (0), Quite a bit of difficulty (1), Moderate difficulty (2), Little difficulty (3), No difficulty (4) Survey date:    Any of your usual work, housework or school activities 1  2. Usual hobbies, recreational or sporting activities 0  3. Getting into/out of the bath 1  4. Walking between rooms 0  5. Putting on socks/shoes 1  6. Squatting  0  7. Lifting an object, like a bag of groceries from the floor 1  8. Performing light activities around your home 1  9. Performing heavy activities around your home 0  10. Getting into/out of a car 1  11. Walking 2 blocks 0  12. Walking 1 mile 0  13. Going up/down 10 stairs (1 flight) 0  14. Standing for 1 hour 0  15.  sitting for 1 hour 4  16. Running on even ground 0  17. Running on uneven ground 0  18. Making sharp turns while running fast 0  19. Hopping  2  20. Rolling over in bed 1  Score total:  13/80     COGNITION: Overall cognitive status: Within functional limits for tasks assessed     SENSATION: Slight decrease in sensation to anterior R  tibia.      POSTURE: rounded shoulders, forward head, and increased thoracic kyphosis  PALPATION: No pain with palpation to R distal stump.   LOWER EXTREMITY ROM:  Active ROM Right eval Left eval  Hip flexion    Hip extension    Hip abduction    Hip adduction    Hip internal rotation    Hip external rotation    Knee flexion    Knee extension 9   Ankle dorsiflexion    Ankle plantarflexion    Ankle inversion    Ankle eversion     (Blank rows = not tested)  LOWER EXTREMITY MMT:  MMT Right eval Left eval  Hip flexion 4+ 4  Hip extension 4+ 4+  Hip abduction 4+ 5  Hip adduction    Hip internal rotation    Hip external rotation    Knee flexion 5 4+  Knee extension 5 4+  Ankle dorsiflexion    Ankle plantarflexion  Ankle inversion    Ankle eversion     (Blank rows = not tested)    FUNCTIONAL TESTS:  Timed up and go (TUG): Not safe to perform at evaluation. 10 meter walk test: Not safe to perform at evaluation.  GAIT: Distance walked: 53' x2 once using // bars and another using FWW.  Assistive device utilized: Environmental Consultant - 2 wheeled and // bars.  Level of assistance: CGA Comments: CGA for safety, decreased stride length, decreased gait speed, lack of R knee extension resulting in lack of R heel strike.                                                                                                                                 TREATMENT DATE: 04/21/2024   -Seated HS stretch with prosthetic on chair and 5 lb. AW on top of R knee. 1'x2     GT training in // bars:   -Trial 1: 3 laps forward   -Trial 2: 2 laps forward/backward  ---------------------------------------------------------------------------  GT outside of // bars using FWW   -Trial 1: 70' with FWW and CGA  -Trial 2: 14' with FWW and CGA    -Trial 3: 21' with FWW and CGA.   -Standing weight shifts at white board finding letters on the write then shifting back to L to make out ABCs in order  A-Z. X1.     PATIENT EDUCATION:  Education details: POC, HEP, safety with gait/AD at home.  Person educated: Patient Education method: Explanation, Demonstration, and Handouts Education comprehension: verbalized understanding  HOME EXERCISE PROGRAM: Access Code: [email or web address] URL: [email or web address] Date: 04/21/2024 Prepared by: Norman Sharps  Exercises - Long Sitting Quad Set  - 2 x daily - 1 x weekly - 3 sets - 10 reps - 5 seconds hold - Seated Hamstring Stretch with Chair  - 2 x daily - 1 x weekly - 1 sets - 5 reps - 30 second hold - Prone Knee Extension Hang  - 1 x daily - 7 x weekly - 1 sets - 3 reps - 5 minute hold  ASSESSMENT:  CLINICAL IMPRESSION: Pt began today's treatment session with ambulating in // bars to forward and backward working to improve gait mechanics (R knee extension) and balance with gait. Seated HS stretch performed to improve R knee extension with AW  on top of R knee with prosthetic in chair. He continues to have limited extension resulting in lack of terminal knee extension during stance phase and decreased heel strike. He then performed gait out of // bars using FWW and CGA for safety. Patient walked 3x trials >50' each for improved gait mechanics, improved endurance, and improved ability to perform functional tasks at home. Standing weight shifts with activity at white board performed to improve standing activity tolerance and functional activities at home. Patient has some pain in R stump initially, but reported pain reduced the more he walked on it during treatment today.  Overall, patient continues to be very motivated to reach goals and follows cues well for improved performance during PT treatment session. Patient is a good candidate for skilled PT treatment 2x/week for improvement on deficits listed above.    OBJECTIVE IMPAIRMENTS: Abnormal gait, decreased activity tolerance, decreased balance, decreased coordination, decreased endurance,  difficulty walking, decreased ROM, and decreased strength.   ACTIVITY LIMITATIONS: carrying, lifting, bending, standing, squatting, stairs, and transfers  PARTICIPATION LIMITATIONS: meal prep, cleaning, laundry, driving, shopping, community activity, and yard work  PERSONAL FACTORS: Time since onset of injury/illness/exacerbation and 1-2 comorbidities: HTN, HLD, CAD, s/p stent, DM, Chronic LBP, Obesity. are also affecting patient's functional outcome.   REHAB POTENTIAL: Good  CLINICAL DECISION MAKING: Stable/uncomplicated  EVALUATION COMPLEXITY: Moderate   GOALS: Goals reviewed with patient? Yes  SHORT TERM GOALS: Target date:  05/19/2024 Baseline: Goal status: INITIAL    LONG TERM GOALS: Target date: 07/14/2024  1.  Patient will improve R knee extension to neutral (0) degrees by discharge allowing improved gait mechanics.  Baseline: 9 degrees from full extension.  Goal status: INITIAL  2.  Patient will improve LEFS outcome score by 12 points or higher by discharge allowing him to perform ADLs with less difficulty.  Baseline: 13/80 Goal status: INITIAL   3.  Patient will reduce timed up and go to <11 seconds to reduce fall risk and demonstrate improved transfer/gait ability. Baseline: Did not perform at evaluation due to decreased safety.  Goal status: INITIAL  4.  Patient will increase 10 meter walk test to >1.36m/s as to improve gait speed for better community ambulation and to reduce fall risk. Baseline: Did not perform at evaluation due to decreased safety.  Goal status: INITIAL  5.  Patient will ambulated > 500 feet with least restrictive AD on all surfaces with modified independence  for improved normalized gait pattern with less chance of fatigue/LBP.  Baseline: EVAL- 20'' in // bars with CGA.  Goal status: INITIAL   PLAN:  PT FREQUENCY: 1-2x/week  PT DURATION: 12 weeks  PLANNED INTERVENTIONS: 97164- PT Re-evaluation, 97750- Physical Performance Testing,  97110-Therapeutic exercises, 97530- Therapeutic activity, [id number]- Neuromuscular re-education, 97535- Self Care, [id number]- Manual therapy, [phone]- Gait training, [phone]- Prosthetic Initial , [phone]- Orthotic/Prosthetic subsequent, Patient/Family education, Balance training, Stair training, DME instructions, and Wheelchair mobility training  PLAN FOR NEXT SESSION:  Work to improve R knee extension ROM.  Continue working to improve standing balance/gait/tolerance to weight bearing through R LE.  Norman Sharps, PT, DPT Physical Therapist - [facility]   Norman KATHEE Sharps, Patrick 04/28/2024, 4:16 PM

## 2024-04-29 ENCOUNTER — Encounter

## 2024-05-03 ENCOUNTER — Ambulatory Visit

## 2024-05-03 DIAGNOSIS — Z89511 Acquired absence of right leg below knee: Secondary | ICD-10-CM | POA: Diagnosis not present

## 2024-05-03 NOTE — Therapy (Incomplete)
 OUTPATIENT PHYSICAL THERAPY LOWER EXTREMITY EVALUATION   Patient Name: Gerald Ellis MRN: [id number] DOB:01/11/1966, 58 y.o., male Today's Date: 05/03/2024  END OF SESSION:    Past Medical History:  Diagnosis Date   Benign enlargement of prostate    Bronchitis    Childhood asthma    Diabetes mellitus without complication (HCC)    Environmental allergies    Erectile dysfunction    Headache    Hemorrhoids    Hypercholesteremia    Hypertension    Hypogonadism in male    Lumbago    Over weight    Past Surgical History:  Procedure Laterality Date   AMPUTATION Right 05/15/2023   Procedure: AMPUTATION BELOW KNEE;  Surgeon: Marea Selinda RAMAN, MD;  Location: [facility];  Service: General;  Laterality: Right;   AMPUTATION Right 05/16/2023   Procedure: REVISION OF BELOW KNEE AMPUTATION;  Surgeon: Marea Selinda RAMAN, MD;  Location: [facility];  Service: General;  Laterality: Right;   BACK SURGERY  05/20/1990   CHOLECYSTECTOMY     COLONOSCOPY N/A 12/26/2023   Procedure: COLONOSCOPY;  Surgeon: Maryruth Ole DASEN, MD;  Location: [facility];  Service: Endoscopy;  Laterality: N/A;   COLONOSCOPY WITH PROPOFOL  N/A 08/07/2015   Procedure: COLONOSCOPY WITH PROPOFOL ;  Surgeon: Gladis RAYMOND Mariner, MD;  Location: [facility] ENDOSCOPY;  Service: Endoscopy;  Laterality: N/A;   COLONOSCOPY WITH PROPOFOL  N/A 08/08/2015   Procedure: COLONOSCOPY WITH PROPOFOL ;  Surgeon: Gladis RAYMOND Mariner, MD;  Location: [facility] ENDOSCOPY;  Service: Endoscopy;  Laterality: N/A;   COLONOSCOPY WITH PROPOFOL  N/A 09/16/2017   Procedure: COLONOSCOPY WITH PROPOFOL ;  Surgeon: Mariner Gladis RAYMOND, MD;  Location: [facility] ENDOSCOPY;  Service: Endoscopy;  Laterality: N/A;   COLONOSCOPY WITH PROPOFOL  N/A 12/20/2020   Procedure: COLONOSCOPY WITH PROPOFOL ;  Surgeon: Dessa Reyes ORN, MD;  Location: [facility] ENDOSCOPY;  Service: Endoscopy;  Laterality: N/A;  IDDM   CORONARY STENT INTERVENTION N/A 04/20/2019   Procedure: CORONARY STENT INTERVENTION;   Surgeon: Ammon Blunt, MD;  Location: [facility];  Service: Cardiovascular;  Laterality: N/A;   ESOPHAGOGASTRODUODENOSCOPY N/A 12/26/2023   Procedure: EGD (ESOPHAGOGASTRODUODENOSCOPY);  Surgeon: Maryruth Ole DASEN, MD;  Location: [facility] ENDOSCOPY;  Service: Endoscopy;  Laterality: N/A;   ESOPHAGOGASTRODUODENOSCOPY (EGD) WITH PROPOFOL  N/A 08/07/2015   Procedure: ESOPHAGOGASTRODUODENOSCOPY (EGD) WITH PROPOFOL ;  Surgeon: Gladis RAYMOND Mariner, MD;  Location: [facility] ENDOSCOPY;  Service: Endoscopy;  Laterality: N/A;   ESOPHAGOGASTRODUODENOSCOPY (EGD) WITH PROPOFOL  N/A 09/16/2017   Procedure: ESOPHAGOGASTRODUODENOSCOPY (EGD) WITH PROPOFOL ;  Surgeon: Mariner Gladis RAYMOND, MD;  Location: [facility] ENDOSCOPY;  Service: Endoscopy;  Laterality: N/A;   ESOPHAGOGASTRODUODENOSCOPY (EGD) WITH PROPOFOL  N/A 12/20/2020   Procedure: ESOPHAGOGASTRODUODENOSCOPY (EGD) WITH PROPOFOL ;  Surgeon: Dessa Reyes ORN, MD;  Location: [facility] ENDOSCOPY;  Service: Endoscopy;  Laterality: N/A;   HEMOSTASIS CLIP PLACEMENT  12/26/2023   Procedure: CONTROL OF HEMORRHAGE, GI TRACT, ENDOSCOPIC, BY CLIPPING OR OVERSEWING;  Surgeon: Maryruth Ole DASEN, MD;  Location: [facility] ENDOSCOPY;  Service: Endoscopy;;   IRRIGATION AND DEBRIDEMENT FOOT Right 05/12/2023   Procedure: IRRIGATION AND DEBRIDEMENT FOOT, WASH OUT;  Surgeon: Neill Boas, DPM;  Location: [facility];  Service: Orthopedics/Podiatry;  Laterality: Right;   LEFT HEART CATH AND CORONARY ANGIOGRAPHY Left 04/20/2019   Procedure: LEFT HEART CATH AND CORONARY ANGIOGRAPHY;  Surgeon: Ammon Blunt, MD;  Location: [facility];  Service: Cardiovascular;  Laterality: Left;   LOWER EXTREMITY ANGIOGRAPHY Right 05/09/2023   Procedure: Lower Extremity Angiography;  Surgeon: Jama Cordella MATSU, MD;  Location: [facility];  Service: Cardiovascular;  Laterality:  Right;   POLYPECTOMY  12/26/2023   Procedure: POLYPECTOMY, INTESTINE;  Surgeon: Maryruth Ole DASEN, MD;   Location: [facility] ENDOSCOPY;  Service: Endoscopy;;   TRANSMETATARSAL AMPUTATION Right 05/08/2023   Procedure: TRANSMETATARSAL AMPUTATION;  Surgeon: Neill Boas, DPM;  Location: [facility];  Service: Orthopedics/Podiatry;  Laterality: Right;   Patient Active Problem List   Diagnosis Date Noted   DVT (deep venous thrombosis) (HCC) 02/24/2024   Acute respiratory failure with hypoxia (HCC) 01/21/2024   Saddle embolus of pulmonary artery (HCC) 01/19/2024   Hx of BKA, right (HCC) 09/02/2023   Diabetic foot infection (HCC) 05/07/2023   Sepsis (HCC) 05/07/2023   Chronic back pain 05/07/2023   Anxiety 05/07/2023   Obesity, Class III, BMI 40-49.9 (morbid obesity) (HCC) 05/07/2023   Uncontrolled type 2 diabetes mellitus with hyperglycemia, with long-term current use of insulin  (HCC) 05/07/2023   Genetic testing 04/09/2022   Chronic venous insufficiency 06/29/2020   Lymphedema 06/29/2020   PAD (peripheral artery disease) 05/30/2020   Ankle ulcer, right, with fat layer exposed (HCC) 05/30/2020   Allergy 05/29/2020   Asthma without status asthmaticus 05/29/2020   Uncontrolled type 2 diabetes mellitus with hyperglycemia (HCC) 05/29/2020   S/P coronary artery stent placement 04/30/2019   CAD S/P percutaneous coronary angioplasty 04/20/2019   Abnormal ECG 03/18/2019   Chest pain with high risk for cardiac etiology 03/18/2019   SOB (shortness of breath) on exertion 03/18/2019   Hypercholesterolemia 05/07/2015   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   Hypogonadism in male 11/22/2014   BPH with obstruction/lower urinary tract symptoms 11/22/2014   DDD (degenerative disc disease), lumbar 10/13/2014   Status post lumbar laminectomy 10/13/2014   DJD of shoulder 10/13/2014   Bilateral occipital neuralgia 10/13/2014   Lumbar radiculopathy 10/13/2014   DDD (degenerative disc disease), cervical 10/13/2014   Depression 08/25/2013   HTN (hypertension) 08/25/2013   History of hemorrhoids 08/25/2013    Low back pain 08/25/2013    PCP: Valora Lynwood FALCON, MD   REFERRING PROVIDER: Delores Orvin BRAVO, NP   REFERRING DIAG: [phone] (ICD-10-CM) - Hx of right BKA (HCC)   THERAPY DIAG:  No diagnosis found.  Rationale for Evaluation and Treatment: Rehabilitation  ONSET DATE: 05/15/2023  SUBJECTIVE:   SUBJECTIVE STATEMENT: ***   PERTINENT HISTORY: Patient is a 58 year old male with R BKA on 05/15/2023 and revision of R BKA on 05/16/2023. PMH: with medical history HTN, HLD, CAD s/p stent, DM with neuropathy, chronic back pain on opiates,, anxiety.  PAIN:  Are you having pain? No  PRECAUTIONS: Fall  RED FLAGS: None   WEIGHT BEARING RESTRICTIONS: No  FALLS:  Has patient fallen in last 6 months? No  LIVING ENVIRONMENT: Lives with: lives with their spouse Lives in: House/apartment Stairs: Ramp to entry Has following equipment at home: Vannie - 2 wheeled, Environmental Consultant - 4 wheeled, Wheelchair (manual), Graybar electric, and Ramped entry, Rollator  OCCUPATION: On disability.  PLOF: Independent  PATIENT GOALS: Patient wants to be able to improve gait and balance with gait.    NEXT MD VISIT: 05/30/2023 January 10th to check for blood clots.   OBJECTIVE:  Note: Objective measures were completed at Evaluation unless otherwise noted.  DIAGNOSTIC FINDINGS: COMPARISON:  CT pulmonary angiogram performed January 19, 2024   FINDINGS: VENOUS   Nonocclusive thrombus is apparent in the right common femoral vein. The imaged portion of the right greater saphenous vein is patent. Occlusive thrombus is identified within the right superficial femoral vein in the proximal segment. Nonocclusive thrombus  is favored in the mid and distal segments. The popliteal vein is noncompressible as are the gastrocnemius/soleal veins. The patient is status post right lower extremity amputation and therefore the tibial veins were not evaluated.   The left common femoral, femoral, and popliteal veins are  patent. There is no evidence of DVT within the tibial veins on the left.   OTHER   None.   Limitations: none   IMPRESSION: 1. Extensive DVT in the right lower extremity which involves the popliteal, femoral, and common femoral veins. 2. No evidence of left lower extremity DVT.   These results will be called to the ordering clinician or representative by the Radiologist Assistant, and communication documented in the PACS or [employer].     Electronically Signed   By: Maude Naegeli M.D.   On: 01/19/2024 12:29  PATIENT SURVEYS:  LEFS  Extreme difficulty/unable (0), Quite a bit of difficulty (1), Moderate difficulty (2), Little difficulty (3), No difficulty (4) Survey date:    Any of your usual work, housework or school activities 1  2. Usual hobbies, recreational or sporting activities 0  3. Getting into/out of the bath 1  4. Walking between rooms 0  5. Putting on socks/shoes 1  6. Squatting  0  7. Lifting an object, like a bag of groceries from the floor 1  8. Performing light activities around your home 1  9. Performing heavy activities around your home 0  10. Getting into/out of a car 1  11. Walking 2 blocks 0  12. Walking 1 mile 0  13. Going up/down 10 stairs (1 flight) 0  14. Standing for 1 hour 0  15.  sitting for 1 hour 4  16. Running on even ground 0  17. Running on uneven ground 0  18. Making sharp turns while running fast 0  19. Hopping  2  20. Rolling over in bed 1  Score total:  13/80     COGNITION: Overall cognitive status: Within functional limits for tasks assessed     SENSATION: Slight decrease in sensation to anterior R tibia.      POSTURE: rounded shoulders, forward head, and increased thoracic kyphosis  PALPATION: No pain with palpation to R distal stump.   LOWER EXTREMITY ROM:  Active ROM Right eval Left eval  Hip flexion    Hip extension    Hip abduction    Hip adduction    Hip internal rotation    Hip external rotation     Knee flexion    Knee extension 9   Ankle dorsiflexion    Ankle plantarflexion    Ankle inversion    Ankle eversion     (Blank rows = not tested)  LOWER EXTREMITY MMT:  MMT Right eval Left eval  Hip flexion 4+ 4  Hip extension 4+ 4+  Hip abduction 4+ 5  Hip adduction    Hip internal rotation    Hip external rotation    Knee flexion 5 4+  Knee extension 5 4+  Ankle dorsiflexion    Ankle plantarflexion    Ankle inversion    Ankle eversion     (Blank rows = not tested)    FUNCTIONAL TESTS:  Timed up and go (TUG): Not safe to perform at evaluation. 10 meter walk test: Not safe to perform at evaluation.  GAIT: Distance walked: 29' x2 once using // bars and another using FWW.  Assistive device utilized: Environmental Consultant - 2 wheeled and // bars.  Level of assistance:  CGA Comments: CGA for safety, decreased stride length, decreased gait speed, lack of R knee extension resulting in lack of R heel strike.                                                                                                                                 TREATMENT DATE: 04/21/2024   *** -Seated HS stretch with prosthetic on chair and 5 lb. AW on top of R knee. 1'x2     GT training in // bars:   -Trial 1: 3 laps forward   -Trial 2: 2 laps forward/backward  ---------------------------------------------------------------------------  GT outside of // bars using FWW   -Trial 1: 31' with FWW and CGA  -Trial 2: 20' with FWW and CGA    -Trial 3: 40' with FWW and CGA.   -Standing weight shifts at white board finding letters on the write then shifting back to L to make out ABCs in order A-Z. X1.     PATIENT EDUCATION:  Education details: POC, HEP, safety with gait/AD at home.  Person educated: Patient Education method: Explanation, Demonstration, and Handouts Education comprehension: verbalized understanding  HOME EXERCISE PROGRAM: Access Code: [email or web address] URL:  [email or web address] Date: 04/21/2024 Prepared by: Norman Sharps  Exercises - Long Sitting Quad Set  - 2 x daily - 1 x weekly - 3 sets - 10 reps - 5 seconds hold - Seated Hamstring Stretch with Chair  - 2 x daily - 1 x weekly - 1 sets - 5 reps - 30 second hold - Prone Knee Extension Hang  - 1 x daily - 7 x weekly - 1 sets - 3 reps - 5 minute hold  ASSESSMENT:  CLINICAL IMPRESSION:  ***  Pt began today's treatment session with ambulating in // bars to forward and backward working to improve gait mechanics (R knee extension) and balance with gait. Seated HS stretch performed to improve R knee extension with AW  on top of R knee with prosthetic in chair. He continues to have limited extension resulting in lack of terminal knee extension during stance phase and decreased heel strike. He then performed gait out of // bars using FWW and CGA for safety. Patient walked 3x trials >50' each for improved gait mechanics, improved endurance, and improved ability to perform functional tasks at home. Standing weight shifts with activity at white board performed to improve standing activity tolerance and functional activities at home. Patient has some pain in R stump initially, but reported pain reduced the more he walked on it during treatment today.   Overall, patient continues to be very motivated to reach goals and follows cues well for improved performance during PT treatment session. Patient is a good candidate for skilled PT treatment 2x/week for improvement on deficits listed above.    OBJECTIVE IMPAIRMENTS: Abnormal gait, decreased activity tolerance, decreased balance, decreased coordination, decreased endurance, difficulty walking, decreased ROM, and decreased strength.   ACTIVITY LIMITATIONS: carrying, lifting, bending,  standing, squatting, stairs, and transfers  PARTICIPATION LIMITATIONS: meal prep, cleaning, laundry, driving, shopping, community activity, and yard work  PERSONAL  FACTORS: Time since onset of injury/illness/exacerbation and 1-2 comorbidities: HTN, HLD, CAD, s/p stent, DM, Chronic LBP, Obesity. are also affecting patient's functional outcome.   REHAB POTENTIAL: Good  CLINICAL DECISION MAKING: Stable/uncomplicated  EVALUATION COMPLEXITY: Moderate   GOALS: Goals reviewed with patient? Yes  SHORT TERM GOALS: Target date:  05/19/2024 Baseline: Goal status: INITIAL    LONG TERM GOALS: Target date: 07/14/2024  1.  Patient will improve R knee extension to neutral (0) degrees by discharge allowing improved gait mechanics.  Baseline: 9 degrees from full extension.  Goal status: INITIAL  2.  Patient will improve LEFS outcome score by 12 points or higher by discharge allowing him to perform ADLs with less difficulty.  Baseline: 13/80 Goal status: INITIAL   3.  Patient will reduce timed up and go to <11 seconds to reduce fall risk and demonstrate improved transfer/gait ability. Baseline: Did not perform at evaluation due to decreased safety.  Goal status: INITIAL  4.  Patient will increase 10 meter walk test to >1.33m/s as to improve gait speed for better community ambulation and to reduce fall risk. Baseline: Did not perform at evaluation due to decreased safety.  Goal status: INITIAL  5.  Patient will ambulated > 500 feet with least restrictive AD on all surfaces with modified independence  for improved normalized gait pattern with less chance of fatigue/LBP.  Baseline: EVAL- 20'' in // bars with CGA.  Goal status: INITIAL   PLAN:  PT FREQUENCY: 1-2x/week  PT DURATION: 12 weeks  PLANNED INTERVENTIONS: [id number]- PT Re-evaluation, 97750- Physical Performance Testing, 97110-Therapeutic exercises, 97530- Therapeutic activity, [id number]- Neuromuscular re-education, 97535- Self Care, [id number]- Manual therapy, [phone]- Gait training, [phone]- Prosthetic Initial , [phone]- Orthotic/Prosthetic subsequent, Patient/Family education, Balance training, Stair training,  DME instructions, and Wheelchair mobility training  PLAN FOR NEXT SESSION:  Work to improve R knee extension ROM.  Continue working to improve standing balance/gait/tolerance to weight bearing through R LE.    Fonda Simpers, PT, DPT Physical Therapist - [facility]  05/03/2024, 2:53 PM

## 2024-05-04 ENCOUNTER — Encounter

## 2024-05-05 ENCOUNTER — Ambulatory Visit

## 2024-05-05 DIAGNOSIS — R262 Difficulty in walking, not elsewhere classified: Secondary | ICD-10-CM

## 2024-05-05 DIAGNOSIS — R269 Unspecified abnormalities of gait and mobility: Secondary | ICD-10-CM | POA: Diagnosis not present

## 2024-05-05 DIAGNOSIS — M6281 Muscle weakness (generalized): Secondary | ICD-10-CM

## 2024-05-05 DIAGNOSIS — R278 Other lack of coordination: Secondary | ICD-10-CM

## 2024-05-05 DIAGNOSIS — R2689 Other abnormalities of gait and mobility: Secondary | ICD-10-CM

## 2024-05-05 NOTE — Therapy (Signed)
 OUTPATIENT PHYSICAL THERAPY LOWER EXTREMITY Treatment   Patient Name: Gerald Ellis MRN: [id number] DOB:July 14, 1965, 58 y.o., male Today's Date: 05/05/2024  END OF SESSION:  PT End of Session - 05/05/24 1449     Visit Number 4    Number of Visits 25    Date for Recertification  07/14/24    PT Start Time 1448    PT Stop Time 1530    PT Time Calculation (min) 42 min    Equipment Utilized During Treatment Gait belt    Activity Tolerance Patient tolerated treatment well           Past Medical History:  Diagnosis Date   Benign enlargement of prostate    Bronchitis    Childhood asthma    Diabetes mellitus without complication (HCC)    Environmental allergies    Erectile dysfunction    Headache    Hemorrhoids    Hypercholesteremia    Hypertension    Hypogonadism in male    Lumbago    Over weight    Past Surgical History:  Procedure Laterality Date   AMPUTATION Right 05/15/2023   Procedure: AMPUTATION BELOW KNEE;  Surgeon: Marea Selinda RAMAN, MD;  Location: [facility];  Service: General;  Laterality: Right;   AMPUTATION Right 05/16/2023   Procedure: REVISION OF BELOW KNEE AMPUTATION;  Surgeon: Marea Selinda RAMAN, MD;  Location: [facility];  Service: General;  Laterality: Right;   BACK SURGERY  05/20/1990   CHOLECYSTECTOMY     COLONOSCOPY N/A 12/26/2023   Procedure: COLONOSCOPY;  Surgeon: Maryruth Ole DASEN, MD;  Location: [facility];  Service: Endoscopy;  Laterality: N/A;   COLONOSCOPY WITH PROPOFOL  N/A 08/07/2015   Procedure: COLONOSCOPY WITH PROPOFOL ;  Surgeon: Gladis RAYMOND Mariner, MD;  Location: [facility] ENDOSCOPY;  Service: Endoscopy;  Laterality: N/A;   COLONOSCOPY WITH PROPOFOL  N/A 08/08/2015   Procedure: COLONOSCOPY WITH PROPOFOL ;  Surgeon: Gladis RAYMOND Mariner, MD;  Location: [facility] ENDOSCOPY;  Service: Endoscopy;  Laterality: N/A;   COLONOSCOPY WITH PROPOFOL  N/A 09/16/2017   Procedure: COLONOSCOPY WITH PROPOFOL ;  Surgeon: Mariner Gladis RAYMOND, MD;  Location: [facility] ENDOSCOPY;   Service: Endoscopy;  Laterality: N/A;   COLONOSCOPY WITH PROPOFOL  N/A 12/20/2020   Procedure: COLONOSCOPY WITH PROPOFOL ;  Surgeon: Dessa Reyes ORN, MD;  Location: [facility] ENDOSCOPY;  Service: Endoscopy;  Laterality: N/A;  IDDM   CORONARY STENT INTERVENTION N/A 04/20/2019   Procedure: CORONARY STENT INTERVENTION;  Surgeon: Ammon Blunt, MD;  Location: [facility];  Service: Cardiovascular;  Laterality: N/A;   ESOPHAGOGASTRODUODENOSCOPY N/A 12/26/2023   Procedure: EGD (ESOPHAGOGASTRODUODENOSCOPY);  Surgeon: Maryruth Ole DASEN, MD;  Location: [facility]COPY;  Service: Endoscopy;  Laterality: N/A;   ESOPHAGOGASTRODUODENOSCOPY (EGD) WITH PROPOFOL  N/A 08/07/2015   Procedure: ESOPHAGOGASTRODUODENOSCOPY (EGD) WITH PROPOFOL ;  Surgeon: Gladis RAYMOND Mariner, MD;  Location: [facility] ENDOSCOPY;  Service: Endoscopy;  Laterality: N/A;   ESOPHAGOGASTRODUODENOSCOPY (EGD) WITH PROPOFOL  N/A 09/16/2017   Procedure: ESOPHAGOGASTRODUODENOSCOPY (EGD) WITH PROPOFOL ;  Surgeon: Mariner Gladis RAYMOND, MD;  Location: [facility] ENDOSCOPY;  Service: Endoscopy;  Laterality: N/A;   ESOPHAGOGASTRODUODENOSCOPY (EGD) WITH PROPOFOL  N/A 12/20/2020   Procedure: ESOPHAGOGASTRODUODENOSCOPY (EGD) WITH PROPOFOL ;  Surgeon: Dessa Reyes ORN, MD;  Location: [facility] ENDOSCOPY;  Service: Endoscopy;  Laterality: N/A;   HEMOSTASIS CLIP PLACEMENT  12/26/2023   Procedure: CONTROL OF HEMORRHAGE, GI TRACT, ENDOSCOPIC, BY CLIPPING OR OVERSEWING;  Surgeon: Maryruth Ole DASEN, MD;  Location: [facility] ENDOSCOPY;  Service: Endoscopy;;   IRRIGATION AND DEBRIDEMENT FOOT Right 05/12/2023   Procedure: IRRIGATION AND DEBRIDEMENT FOOT, WASH OUT;  Surgeon: Neill,  Krystal, DPM;  Location: [facility];  Service: Orthopedics/Podiatry;  Laterality: Right;   LEFT HEART CATH AND CORONARY ANGIOGRAPHY Left 04/20/2019   Procedure: LEFT HEART CATH AND CORONARY ANGIOGRAPHY;  Surgeon: Ammon Blunt, MD;  Location: [facility];  Service: Cardiovascular;  Laterality:  Left;   LOWER EXTREMITY ANGIOGRAPHY Right 05/09/2023   Procedure: Lower Extremity Angiography;  Surgeon: Jama Cordella MATSU, MD;  Location: [facility];  Service: Cardiovascular;  Laterality: Right;   POLYPECTOMY  12/26/2023   Procedure: POLYPECTOMY, INTESTINE;  Surgeon: Maryruth Ole DASEN, MD;  Location: [facility] ENDOSCOPY;  Service: Endoscopy;;   TRANSMETATARSAL AMPUTATION Right 05/08/2023   Procedure: TRANSMETATARSAL AMPUTATION;  Surgeon: Neill Krystal, DPM;  Location: [facility];  Service: Orthopedics/Podiatry;  Laterality: Right;   Patient Active Problem List   Diagnosis Date Noted   DVT (deep venous thrombosis) (HCC) 02/24/2024   Acute respiratory failure with hypoxia (HCC) 01/21/2024   Saddle embolus of pulmonary artery (HCC) 01/19/2024   Hx of BKA, right (HCC) 09/02/2023   Diabetic foot infection (HCC) 05/07/2023   Sepsis (HCC) 05/07/2023   Chronic back pain 05/07/2023   Anxiety 05/07/2023   Obesity, Class III, BMI 40-49.9 (morbid obesity) (HCC) 05/07/2023   Uncontrolled type 2 diabetes mellitus with hyperglycemia, with long-term current use of insulin  (HCC) 05/07/2023   Genetic testing 04/09/2022   Chronic venous insufficiency 06/29/2020   Lymphedema 06/29/2020   PAD (peripheral artery disease) 05/30/2020   Ankle ulcer, right, with fat layer exposed (HCC) 05/30/2020   Allergy 05/29/2020   Asthma without status asthmaticus 05/29/2020   Uncontrolled type 2 diabetes mellitus with hyperglycemia (HCC) 05/29/2020   S/P coronary artery stent placement 04/30/2019   CAD S/P percutaneous coronary angioplasty 04/20/2019   Abnormal ECG 03/18/2019   Chest pain with high risk for cardiac etiology 03/18/2019   SOB (shortness of breath) on exertion 03/18/2019   Hypercholesterolemia 05/07/2015   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   Hypogonadism in male 11/22/2014   BPH with obstruction/lower urinary tract symptoms 11/22/2014   DDD (degenerative disc disease), lumbar  10/13/2014   Status post lumbar laminectomy 10/13/2014   DJD of shoulder 10/13/2014   Bilateral occipital neuralgia 10/13/2014   Lumbar radiculopathy 10/13/2014   DDD (degenerative disc disease), cervical 10/13/2014   Depression 08/25/2013   HTN (hypertension) 08/25/2013   History of hemorrhoids 08/25/2013   Low back pain 08/25/2013    PCP: Valora Lynwood FALCON, MD   REFERRING PROVIDER: Delores Orvin BRAVO, NP   REFERRING DIAG: [phone] (ICD-10-CM) - Hx of right BKA (HCC)   THERAPY DIAG:  No diagnosis found.  Rationale for Evaluation and Treatment: Rehabilitation  ONSET DATE: 05/15/2023  SUBJECTIVE:   SUBJECTIVE STATEMENT: Patient reports doing well with no pain upon arrival. He reports he went to [facility] a couple days ago, but they did not make any adjustments. He reports he has been working on stretching the R leg at home and is walking a little bit.    PERTINENT HISTORY: Patient is a 58 year old male with R BKA on 05/15/2023 and revision of R BKA on 05/16/2023. PMH: with medical history HTN, HLD, CAD s/p stent, DM with neuropathy, chronic back pain on opiates,, anxiety.  PAIN:  Are you having pain? No  PRECAUTIONS: Fall  RED FLAGS: None   WEIGHT BEARING RESTRICTIONS: No  FALLS:  Has patient fallen in last 6 months? No  LIVING ENVIRONMENT: Lives with: lives with their spouse Lives in: House/apartment Stairs: Ramp to entry Has following equipment  at home: Vannie - 2 wheeled, Environmental Consultant - 4 wheeled, Wheelchair (manual), Graybar electric, and Ramped entry, Rollator  OCCUPATION: On disability.  PLOF: Independent  PATIENT GOALS: Patient wants to be able to improve gait and balance with gait.    NEXT MD VISIT: 05/30/2023 January 10th to check for blood clots.   OBJECTIVE:  Note: Objective measures were completed at Evaluation unless otherwise noted.  DIAGNOSTIC FINDINGS: COMPARISON:  CT pulmonary angiogram performed January 19, 2024   FINDINGS: VENOUS   Nonocclusive  thrombus is apparent in the right common femoral vein. The imaged portion of the right greater saphenous vein is patent. Occlusive thrombus is identified within the right superficial femoral vein in the proximal segment. Nonocclusive thrombus is favored in the mid and distal segments. The popliteal vein is noncompressible as are the gastrocnemius/soleal veins. The patient is status post right lower extremity amputation and therefore the tibial veins were not evaluated.   The left common femoral, femoral, and popliteal veins are patent. There is no evidence of DVT within the tibial veins on the left.   OTHER   None.   Limitations: none   IMPRESSION: 1. Extensive DVT in the right lower extremity which involves the popliteal, femoral, and common femoral veins. 2. No evidence of left lower extremity DVT.   These results will be called to the ordering clinician or representative by the Radiologist Assistant, and communication documented in the PACS or [employer].     Electronically Signed   By: Maude Naegeli M.D.   On: 01/19/2024 12:29  PATIENT SURVEYS:  LEFS  Extreme difficulty/unable (0), Quite a bit of difficulty (1), Moderate difficulty (2), Little difficulty (3), No difficulty (4) Survey date:    Any of your usual work, housework or school activities 1  2. Usual hobbies, recreational or sporting activities 0  3. Getting into/out of the bath 1  4. Walking between rooms 0  5. Putting on socks/shoes 1  6. Squatting  0  7. Lifting an object, like a bag of groceries from the floor 1  8. Performing light activities around your home 1  9. Performing heavy activities around your home 0  10. Getting into/out of a car 1  11. Walking 2 blocks 0  12. Walking 1 mile 0  13. Going up/down 10 stairs (1 flight) 0  14. Standing for 1 hour 0  15.  sitting for 1 hour 4  16. Running on even ground 0  17. Running on uneven ground 0  18. Making sharp turns while running fast 0   19. Hopping  2  20. Rolling over in bed 1  Score total:  13/80     COGNITION: Overall cognitive status: Within functional limits for tasks assessed     SENSATION: Slight decrease in sensation to anterior R tibia.      POSTURE: rounded shoulders, forward head, and increased thoracic kyphosis  PALPATION: No pain with palpation to R distal stump.   LOWER EXTREMITY ROM:  Active ROM Right eval Left eval  Hip flexion    Hip extension    Hip abduction    Hip adduction    Hip internal rotation    Hip external rotation    Knee flexion    Knee extension 9   Ankle dorsiflexion    Ankle plantarflexion    Ankle inversion    Ankle eversion     (Blank rows = not tested)  LOWER EXTREMITY MMT:  MMT Right eval Left eval  Hip  flexion 4+ 4  Hip extension 4+ 4+  Hip abduction 4+ 5  Hip adduction    Hip internal rotation    Hip external rotation    Knee flexion 5 4+  Knee extension 5 4+  Ankle dorsiflexion    Ankle plantarflexion    Ankle inversion    Ankle eversion     (Blank rows = not tested)    FUNCTIONAL TESTS:  Timed up and go (TUG): Not safe to perform at evaluation. 10 meter walk test: Not safe to perform at evaluation.  GAIT: Distance walked: 80' x2 once using // bars and another using FWW.  Assistive device utilized: Environmental Consultant - 2 wheeled and // bars.  Level of assistance: CGA Comments: CGA for safety, decreased stride length, decreased gait speed, lack of R knee extension resulting in lack of R heel strike.                                                                                                                                 TREATMENT DATE: 04/21/2024    -Seated HS stretch with prosthetic on chair and 5 lb. AW on top of R knee. 2'x2     GT training in // bars:  Forward x2 laps.  Backwards x2 laps Side stepping x3 laps (more difficulty shifting to the prosthetic side).      ---------------------------------------------------------------------------  GT outside of // bars using FWW  -78' x4 using FWW with CGA for safety.      PATIENT EDUCATION:  Education details: POC, HEP, safety with gait/AD at home.  Person educated: Patient Education method: Explanation, Demonstration, and Handouts Education comprehension: verbalized understanding  HOME EXERCISE PROGRAM: Access Code: [email or web address] URL: [email or web address] Date: 04/21/2024 Prepared by: Norman Sharps  Exercises - Long Sitting Quad Set  - 2 x daily - 1 x weekly - 3 sets - 10 reps - 5 seconds hold - Seated Hamstring Stretch with Chair  - 2 x daily - 1 x weekly - 1 sets - 5 reps - 30 second hold - Prone Knee Extension Hang  - 1 x daily - 7 x weekly - 1 sets - 3 reps - 5 minute hold  ASSESSMENT:  CLINICAL IMPRESSION:   Patient began today's session with hamstring stretch seated in chair. He continues to have lack of R knee terminal extension noted causing difficulty with standing/gait. He then performed gait activities in // bars walking forward/backward/lateral. He was noted to have more difficulty shifting to the R today during side-steps compared to L. Patient then went to hallway and performed 4 gait trials with FWW CGA for safety. Verbal cuing given for heel strike on R, more upright posture, and to keep head up. Patient ambulated further today than in previous treatment session. He continues to respond well to cuing and show improved mechanics with practice.   Overall, patient continues to be very motivated to reach goals and follows cues well for improved performance  during PT treatment session. Patient is a good candidate for skilled PT treatment 2x/week for improvement on deficits listed above.    OBJECTIVE IMPAIRMENTS: Abnormal gait, decreased activity tolerance, decreased balance, decreased coordination, decreased endurance, difficulty walking, decreased ROM, and decreased  strength.   ACTIVITY LIMITATIONS: carrying, lifting, bending, standing, squatting, stairs, and transfers  PARTICIPATION LIMITATIONS: meal prep, cleaning, laundry, driving, shopping, community activity, and yard work  PERSONAL FACTORS: Time since onset of injury/illness/exacerbation and 1-2 comorbidities: HTN, HLD, CAD, s/p stent, DM, Chronic LBP, Obesity. are also affecting patient's functional outcome.   REHAB POTENTIAL: Good  CLINICAL DECISION MAKING: Stable/uncomplicated  EVALUATION COMPLEXITY: Moderate   GOALS: Goals reviewed with patient? Yes  SHORT TERM GOALS: Target date:  05/19/2024: Patient will be independent with HEP in 4 weeks allowing him to perform exercises at home with proper mechanics and safety.  Baseline: HEP initiated at evaluation.  Goal status: INITIAL    LONG TERM GOALS: Target date: 07/14/2024  1.  Patient will improve R knee extension to neutral (0) degrees by discharge allowing improved gait mechanics.  Baseline: 9 degrees from full extension.  Goal status: INITIAL  2.  Patient will improve LEFS outcome score by 12 points or higher by discharge allowing him to perform ADLs with less difficulty.  Baseline: 13/80 Goal status: INITIAL   3.  Patient will reduce timed up and go to <11 seconds to reduce fall risk and demonstrate improved transfer/gait ability. Baseline: Did not perform at evaluation due to decreased safety.  Goal status: INITIAL  4.  Patient will increase 10 meter walk test to >1.32m/s as to improve gait speed for better community ambulation and to reduce fall risk. Baseline: Did not perform at evaluation due to decreased safety.  Goal status: INITIAL  5.  Patient will ambulated > 500 feet with least restrictive AD on all surfaces with modified independence  for improved normalized gait pattern with less chance of fatigue/LBP.  Baseline: EVAL- 20'' in // bars with CGA.  Goal status: INITIAL   PLAN:  PT FREQUENCY: 1-2x/week  PT  DURATION: 12 weeks  PLANNED INTERVENTIONS: [id number]- PT Re-evaluation, 97750- Physical Performance Testing, 97110-Therapeutic exercises, 97530- Therapeutic activity, [id number]- Neuromuscular re-education, 97535- Self Care, [id number]- Manual therapy, [phone]- Gait training, [phone]- Prosthetic Initial , [phone]- Orthotic/Prosthetic subsequent, Patient/Family education, Balance training, Stair training, DME instructions, and Wheelchair mobility training  PLAN FOR NEXT SESSION:  Work to improve R knee extension ROM.  Continue working to improve standing balance/gait/tolerance to weight bearing through R LE.    Fonda Simpers, PT, DPT Physical Therapist - [facility]  05/05/2024, 5:32 PM

## 2024-05-06 ENCOUNTER — Encounter

## 2024-05-10 ENCOUNTER — Ambulatory Visit

## 2024-05-10 DIAGNOSIS — R269 Unspecified abnormalities of gait and mobility: Secondary | ICD-10-CM | POA: Diagnosis not present

## 2024-05-10 DIAGNOSIS — R262 Difficulty in walking, not elsewhere classified: Secondary | ICD-10-CM

## 2024-05-10 DIAGNOSIS — R2689 Other abnormalities of gait and mobility: Secondary | ICD-10-CM

## 2024-05-10 DIAGNOSIS — M6281 Muscle weakness (generalized): Secondary | ICD-10-CM

## 2024-05-10 DIAGNOSIS — R278 Other lack of coordination: Secondary | ICD-10-CM

## 2024-05-10 NOTE — Therapy (Signed)
 " OUTPATIENT PHYSICAL THERAPY LOWER EXTREMITY Treatment   Patient Name: Gerald Ellis MRN: [id number] DOB:1966/04/15, 58 y.o., male Today's Date: 05/10/2024  END OF SESSION:  PT End of Session - 05/10/24 1620     Visit Number 5    Number of Visits 25    Date for Recertification  07/14/24    PT Start Time 1620    PT Stop Time 1658    PT Time Calculation (min) 38 min    Equipment Utilized During Treatment Gait belt    Activity Tolerance Patient tolerated treatment well            Past Medical History:  Diagnosis Date   Benign enlargement of prostate    Bronchitis    Childhood asthma    Diabetes mellitus without complication (HCC)    Environmental allergies    Erectile dysfunction    Headache    Hemorrhoids    Hypercholesteremia    Hypertension    Hypogonadism in male    Lumbago    Over weight    Past Surgical History:  Procedure Laterality Date   AMPUTATION Right 05/15/2023   Procedure: AMPUTATION BELOW KNEE;  Surgeon: Marea Selinda RAMAN, MD;  Location: [facility];  Service: General;  Laterality: Right;   AMPUTATION Right 05/16/2023   Procedure: REVISION OF BELOW KNEE AMPUTATION;  Surgeon: Marea Selinda RAMAN, MD;  Location: [facility];  Service: General;  Laterality: Right;   BACK SURGERY  05/20/1990   CHOLECYSTECTOMY     COLONOSCOPY N/A 12/26/2023   Procedure: COLONOSCOPY;  Surgeon: Maryruth Ole DASEN, MD;  Location: [facility];  Service: Endoscopy;  Laterality: N/A;   COLONOSCOPY WITH PROPOFOL  N/A 08/07/2015   Procedure: COLONOSCOPY WITH PROPOFOL ;  Surgeon: Gladis RAYMOND Mariner, MD;  Location: [facility] ENDOSCOPY;  Service: Endoscopy;  Laterality: N/A;   COLONOSCOPY WITH PROPOFOL  N/A 08/08/2015   Procedure: COLONOSCOPY WITH PROPOFOL ;  Surgeon: Gladis RAYMOND Mariner, MD;  Location: [facility] ENDOSCOPY;  Service: Endoscopy;  Laterality: N/A;   COLONOSCOPY WITH PROPOFOL  N/A 09/16/2017   Procedure: COLONOSCOPY WITH PROPOFOL ;  Surgeon: Mariner Gladis RAYMOND, MD;  Location: [facility] ENDOSCOPY;   Service: Endoscopy;  Laterality: N/A;   COLONOSCOPY WITH PROPOFOL  N/A 12/20/2020   Procedure: COLONOSCOPY WITH PROPOFOL ;  Surgeon: Dessa Reyes ORN, MD;  Location: [facility] ENDOSCOPY;  Service: Endoscopy;  Laterality: N/A;  IDDM   CORONARY STENT INTERVENTION N/A 04/20/2019   Procedure: CORONARY STENT INTERVENTION;  Surgeon: Ammon Blunt, MD;  Location: [facility];  Service: Cardiovascular;  Laterality: N/A;   ESOPHAGOGASTRODUODENOSCOPY N/A 12/26/2023   Procedure: EGD (ESOPHAGOGASTRODUODENOSCOPY);  Surgeon: Maryruth Ole DASEN, MD;  Location: [facility] ENDOSCOPY;  Service: Endoscopy;  Laterality: N/A;   ESOPHAGOGASTRODUODENOSCOPY (EGD) WITH PROPOFOL  N/A 08/07/2015   Procedure: ESOPHAGOGASTRODUODENOSCOPY (EGD) WITH PROPOFOL ;  Surgeon: Gladis RAYMOND Mariner, MD;  Location: [facility] ENDOSCOPY;  Service: Endoscopy;  Laterality: N/A;   ESOPHAGOGASTRODUODENOSCOPY (EGD) WITH PROPOFOL  N/A 09/16/2017   Procedure: ESOPHAGOGASTRODUODENOSCOPY (EGD) WITH PROPOFOL ;  Surgeon: Mariner Gladis RAYMOND, MD;  Location: [facility]OSCOPY;  Service: Endoscopy;  Laterality: N/A;   ESOPHAGOGASTRODUODENOSCOPY (EGD) WITH PROPOFOL  N/A 12/20/2020   Procedure: ESOPHAGOGASTRODUODENOSCOPY (EGD) WITH PROPOFOL ;  Surgeon: Dessa Reyes ORN, MD;  Location: [facility] ENDOSCOPY;  Service: Endoscopy;  Laterality: N/A;   HEMOSTASIS CLIP PLACEMENT  12/26/2023   Procedure: CONTROL OF HEMORRHAGE, GI TRACT, ENDOSCOPIC, BY CLIPPING OR OVERSEWING;  Surgeon: Maryruth Ole DASEN, MD;  Location: [facility] ENDOSCOPY;  Service: Endoscopy;;   IRRIGATION AND DEBRIDEMENT FOOT Right 05/12/2023   Procedure: IRRIGATION AND DEBRIDEMENT FOOT, WASH OUT;  Surgeon: Neill Boas, DPM;  Location: [facility];  Service: Orthopedics/Podiatry;  Laterality: Right;   LEFT HEART CATH AND CORONARY ANGIOGRAPHY Left 04/20/2019   Procedure: LEFT HEART CATH AND CORONARY ANGIOGRAPHY;  Surgeon: Ammon Blunt, MD;  Location: [facility];  Service: Cardiovascular;  Laterality:  Left;   LOWER EXTREMITY ANGIOGRAPHY Right 05/09/2023   Procedure: Lower Extremity Angiography;  Surgeon: Jama Cordella MATSU, MD;  Location: [facility];  Service: Cardiovascular;  Laterality: Right;   POLYPECTOMY  12/26/2023   Procedure: POLYPECTOMY, INTESTINE;  Surgeon: Maryruth Ole DASEN, MD;  Location: [facility] ENDOSCOPY;  Service: Endoscopy;;   TRANSMETATARSAL AMPUTATION Right 05/08/2023   Procedure: TRANSMETATARSAL AMPUTATION;  Surgeon: Neill Boas, DPM;  Location: [facility];  Service: Orthopedics/Podiatry;  Laterality: Right;   Patient Active Problem List   Diagnosis Date Noted   DVT (deep venous thrombosis) (HCC) 02/24/2024   Acute respiratory failure with hypoxia (HCC) 01/21/2024   Saddle embolus of pulmonary artery (HCC) 01/19/2024   Hx of BKA, right (HCC) 09/02/2023   Diabetic foot infection (HCC) 05/07/2023   Sepsis (HCC) 05/07/2023   Chronic back pain 05/07/2023   Anxiety 05/07/2023   Obesity, Class III, BMI 40-49.9 (morbid obesity) (HCC) 05/07/2023   Uncontrolled type 2 diabetes mellitus with hyperglycemia, with long-term current use of insulin  (HCC) 05/07/2023   Genetic testing 04/09/2022   Chronic venous insufficiency 06/29/2020   Lymphedema 06/29/2020   PAD (peripheral artery disease) 05/30/2020   Ankle ulcer, right, with fat layer exposed (HCC) 05/30/2020   Allergy 05/29/2020   Asthma without status asthmaticus 05/29/2020   Uncontrolled type 2 diabetes mellitus with hyperglycemia (HCC) 05/29/2020   S/P coronary artery stent placement 04/30/2019   CAD S/P percutaneous coronary angioplasty 04/20/2019   Abnormal ECG 03/18/2019   Chest pain with high risk for cardiac etiology 03/18/2019   SOB (shortness of breath) on exertion 03/18/2019   Hypercholesterolemia 05/07/2015   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   Hypogonadism in male 11/22/2014   BPH with obstruction/lower urinary tract symptoms 11/22/2014   DDD (degenerative disc disease), lumbar  10/13/2014   Status post lumbar laminectomy 10/13/2014   DJD of shoulder 10/13/2014   Bilateral occipital neuralgia 10/13/2014   Lumbar radiculopathy 10/13/2014   DDD (degenerative disc disease), cervical 10/13/2014   Depression 08/25/2013   HTN (hypertension) 08/25/2013   History of hemorrhoids 08/25/2013   Low back pain 08/25/2013    PCP: Valora Lynwood FALCON, MD   REFERRING PROVIDER: Delores Orvin BRAVO, NP   REFERRING DIAG: [phone] (ICD-10-CM) - Hx of right BKA (HCC)   THERAPY DIAG:  Abnormality of gait and mobility  Other abnormalities of gait and mobility  Difficulty in walking, not elsewhere classified  Muscle weakness (generalized)  Other lack of coordination  Rationale for Evaluation and Treatment: Rehabilitation  ONSET DATE: 05/15/2023  SUBJECTIVE:   SUBJECTIVE STATEMENT:   Patient reports pain in the residual limb on R. He recently had been dealing with the flu.    PERTINENT HISTORY: Patient is a 58 year old male with R BKA on 05/15/2023 and revision of R BKA on 05/16/2023. PMH: with medical history HTN, HLD, CAD s/p stent, DM with neuropathy, chronic back pain on opiates,, anxiety.  PAIN:  Are you having pain? No  PRECAUTIONS: Fall  RED FLAGS: None   WEIGHT BEARING RESTRICTIONS: No  FALLS:  Has patient fallen in last 6 months? No  LIVING ENVIRONMENT: Lives with: lives with their spouse Lives in: House/apartment Stairs: Ramp to entry Has following  equipment at home: Vannie - 2 wheeled, Environmental Consultant - 4 wheeled, Wheelchair (manual), Graybar electric, and Ramped entry, Rollator  OCCUPATION: On disability.  PLOF: Independent  PATIENT GOALS: Patient wants to be able to improve gait and balance with gait.    NEXT MD VISIT: 05/30/2023 January 10th to check for blood clots.   OBJECTIVE:  Note: Objective measures were completed at Evaluation unless otherwise noted.  DIAGNOSTIC FINDINGS: COMPARISON:  CT pulmonary angiogram performed January 19, 2024    FINDINGS: VENOUS   Nonocclusive thrombus is apparent in the right common femoral vein. The imaged portion of the right greater saphenous vein is patent. Occlusive thrombus is identified within the right superficial femoral vein in the proximal segment. Nonocclusive thrombus is favored in the mid and distal segments. The popliteal vein is noncompressible as are the gastrocnemius/soleal veins. The patient is status post right lower extremity amputation and therefore the tibial veins were not evaluated.   The left common femoral, femoral, and popliteal veins are patent. There is no evidence of DVT within the tibial veins on the left.   OTHER   None.   Limitations: none   IMPRESSION: 1. Extensive DVT in the right lower extremity which involves the popliteal, femoral, and common femoral veins. 2. No evidence of left lower extremity DVT.   These results will be called to the ordering clinician or representative by the Radiologist Assistant, and communication documented in the PACS or [employer].     Electronically Signed   By: Maude Naegeli M.D.   On: 01/19/2024 12:29  PATIENT SURVEYS:  LEFS  Extreme difficulty/unable (0), Quite a bit of difficulty (1), Moderate difficulty (2), Little difficulty (3), No difficulty (4) Survey date:    Any of your usual work, housework or school activities 1  2. Usual hobbies, recreational or sporting activities 0  3. Getting into/out of the bath 1  4. Walking between rooms 0  5. Putting on socks/shoes 1  6. Squatting  0  7. Lifting an object, like a bag of groceries from the floor 1  8. Performing light activities around your home 1  9. Performing heavy activities around your home 0  10. Getting into/out of a car 1  11. Walking 2 blocks 0  12. Walking 1 mile 0  13. Going up/down 10 stairs (1 flight) 0  14. Standing for 1 hour 0  15.  sitting for 1 hour 4  16. Running on even ground 0  17. Running on uneven ground 0  18. Making  sharp turns while running fast 0  19. Hopping  2  20. Rolling over in bed 1  Score total:  13/80     COGNITION: Overall cognitive status: Within functional limits for tasks assessed     SENSATION: Slight decrease in sensation to anterior R tibia.      POSTURE: rounded shoulders, forward head, and increased thoracic kyphosis  PALPATION: No pain with palpation to R distal stump.   LOWER EXTREMITY ROM:  Active ROM Right eval Left eval  Hip flexion    Hip extension    Hip abduction    Hip adduction    Hip internal rotation    Hip external rotation    Knee flexion    Knee extension 9   Ankle dorsiflexion    Ankle plantarflexion    Ankle inversion    Ankle eversion     (Blank rows = not tested)  LOWER EXTREMITY MMT:  MMT Right eval Left eval  Hip flexion 4+ 4  Hip extension 4+ 4+  Hip abduction 4+ 5  Hip adduction    Hip internal rotation    Hip external rotation    Knee flexion 5 4+  Knee extension 5 4+  Ankle dorsiflexion    Ankle plantarflexion    Ankle inversion    Ankle eversion     (Blank rows = not tested)    FUNCTIONAL TESTS:  Timed up and go (TUG): Not safe to perform at evaluation. 10 meter walk test: Not safe to perform at evaluation.  GAIT: Distance walked: 26' x2 once using // bars and another using FWW.  Assistive device utilized: Environmental Consultant - 2 wheeled and // bars.  Level of assistance: CGA Comments: CGA for safety, decreased stride length, decreased gait speed, lack of R knee extension resulting in lack of R heel strike.                                                                                                                                 TREATMENT DATE: 04/21/2024     -Seated HS stretch with prosthetic on chair and 5 lb. AW on top of R knee. 2'x2     GT training in // bars:  Forward 2x2 laps.  Backwards 2x2 laps Side stepping x2 laps (more difficulty shifting to the prosthetic side).    ---------------------------------------------------------------------------  GT outside of // bars using FWW   - 150' x 2 with CGA for safety and w/c follow     PATIENT EDUCATION:  Education details: POC, HEP, safety with gait/AD at home.  Person educated: Patient Education method: Explanation, Demonstration, and Handouts Education comprehension: verbalized understanding  HOME EXERCISE PROGRAM: Access Code: [email or web address] URL: [email or web address] Date: 04/21/2024 Prepared by: Norman Sharps  Exercises - Long Sitting Quad Set  - 2 x daily - 1 x weekly - 3 sets - 10 reps - 5 seconds hold - Seated Hamstring Stretch with Chair  - 2 x daily - 1 x weekly - 1 sets - 5 reps - 30 second hold - Prone Knee Extension Hang  - 1 x daily - 7 x weekly - 1 sets - 3 reps - 5 minute hold  ASSESSMENT:  CLINICAL IMPRESSION:    Patient began today's session with hamstring stretch seated in chair. He continues to have lack of R knee terminal extension noted causing difficulty with standing/gait. Continued focus on gait training in // bars and with RW in hallway with CGA-minA. Verbal cuing given for heel strike on R, more upright posture, and to keep head up.  He continues to respond well to cuing and show improved mechanics with practice. Overall, patient continues to be very motivated to reach goals and follows cues well for improved performance during PT treatment session.  OBJECTIVE IMPAIRMENTS: Abnormal gait, decreased activity tolerance, decreased balance, decreased coordination, decreased endurance, difficulty walking, decreased ROM, and decreased strength.   ACTIVITY LIMITATIONS: carrying, lifting, bending, standing,  squatting, stairs, and transfers  PARTICIPATION LIMITATIONS: meal prep, cleaning, laundry, driving, shopping, community activity, and yard work  PERSONAL FACTORS: Time since onset of injury/illness/exacerbation and 1-2 comorbidities: HTN, HLD, CAD, s/p stent, DM, Chronic  LBP, Obesity. are also affecting patient's functional outcome.   REHAB POTENTIAL: Good  CLINICAL DECISION MAKING: Stable/uncomplicated  EVALUATION COMPLEXITY: Moderate   GOALS: Goals reviewed with patient? Yes  SHORT TERM GOALS: Target date:  05/19/2024: Patient will be independent with HEP in 4 weeks allowing him to perform exercises at home with proper mechanics and safety.  Baseline: HEP initiated at evaluation.  Goal status: INITIAL    LONG TERM GOALS: Target date: 07/14/2024  1.  Patient will improve R knee extension to neutral (0) degrees by discharge allowing improved gait mechanics.  Baseline: 9 degrees from full extension.  Goal status: INITIAL  2.  Patient will improve LEFS outcome score by 12 points or higher by discharge allowing him to perform ADLs with less difficulty.  Baseline: 13/80 Goal status: INITIAL   3.  Patient will reduce timed up and go to <11 seconds to reduce fall risk and demonstrate improved transfer/gait ability. Baseline: Did not perform at evaluation due to decreased safety.  Goal status: INITIAL  4.  Patient will increase 10 meter walk test to >1.41m/s as to improve gait speed for better community ambulation and to reduce fall risk. Baseline: Did not perform at evaluation due to decreased safety.  Goal status: INITIAL  5.  Patient will ambulated > 500 feet with least restrictive AD on all surfaces with modified independence  for improved normalized gait pattern with less chance of fatigue/LBP.  Baseline: EVAL- 20'' in // bars with CGA.  Goal status: INITIAL   PLAN:  PT FREQUENCY: 1-2x/week  PT DURATION: 12 weeks  PLANNED INTERVENTIONS: 97164- PT Re-evaluation, 97750- Physical Performance Testing, 97110-Therapeutic exercises, 97530- Therapeutic activity, [id number]- Neuromuscular re-education, 97535- Self Care, [id number]- Manual therapy, [phone]- Gait training, [phone]- Prosthetic Initial , [phone]- Orthotic/Prosthetic subsequent, Patient/Family  education, Balance training, Stair training, DME instructions, and Wheelchair mobility training  PLAN FOR NEXT SESSION:  Work to improve R knee extension ROM.  Continue working to improve standing balance/gait/tolerance to weight bearing through R LE.   Maryanne Finder, PT, DPT Physical Therapist - [facility] 05/10/2024, 4:56 PM  "

## 2024-05-11 ENCOUNTER — Encounter

## 2024-05-12 ENCOUNTER — Ambulatory Visit

## 2024-05-17 ENCOUNTER — Ambulatory Visit: Admitting: Physical Therapy

## 2024-05-17 NOTE — Therapy (Deleted)
 " OUTPATIENT PHYSICAL THERAPY LOWER EXTREMITY Treatment   Patient Name: Gerald Ellis MRN: [id number] DOB:June 26, 1965, 58 y.o., male Today's Date: 05/17/2024  END OF SESSION:      Past Medical History:  Diagnosis Date   Benign enlargement of prostate    Bronchitis    Childhood asthma    Diabetes mellitus without complication (HCC)    Environmental allergies    Erectile dysfunction    Headache    Hemorrhoids    Hypercholesteremia    Hypertension    Hypogonadism in male    Lumbago    Over weight    Past Surgical History:  Procedure Laterality Date   AMPUTATION Right 05/15/2023   Procedure: AMPUTATION BELOW KNEE;  Surgeon: Marea Selinda RAMAN, MD;  Location: [facility];  Service: General;  Laterality: Right;   AMPUTATION Right 05/16/2023   Procedure: REVISION OF BELOW KNEE AMPUTATION;  Surgeon: Marea Selinda RAMAN, MD;  Location: [facility];  Service: General;  Laterality: Right;   BACK SURGERY  05/20/1990   CHOLECYSTECTOMY     COLONOSCOPY N/A 12/26/2023   Procedure: COLONOSCOPY;  Surgeon: Maryruth Ole DASEN, MD;  Location: [facility];  Service: Endoscopy;  Laterality: N/A;   COLONOSCOPY WITH PROPOFOL  N/A 08/07/2015   Procedure: COLONOSCOPY WITH PROPOFOL ;  Surgeon: Gladis RAYMOND Mariner, MD;  Location: [facility] ENDOSCOPY;  Service: Endoscopy;  Laterality: N/A;   COLONOSCOPY WITH PROPOFOL  N/A 08/08/2015   Procedure: COLONOSCOPY WITH PROPOFOL ;  Surgeon: Gladis RAYMOND Mariner, MD;  Location: [facility] ENDOSCOPY;  Service: Endoscopy;  Laterality: N/A;   COLONOSCOPY WITH PROPOFOL  N/A 09/16/2017   Procedure: COLONOSCOPY WITH PROPOFOL ;  Surgeon: Mariner Gladis RAYMOND, MD;  Location: [facility] ENDOSCOPY;  Service: Endoscopy;  Laterality: N/A;   COLONOSCOPY WITH PROPOFOL  N/A 12/20/2020   Procedure: COLONOSCOPY WITH PROPOFOL ;  Surgeon: Dessa Reyes ORN, MD;  Location: [facility] ENDOSCOPY;  Service: Endoscopy;  Laterality: N/A;  IDDM   CORONARY STENT INTERVENTION N/A 04/20/2019   Procedure: CORONARY STENT INTERVENTION;   Surgeon: Ammon Blunt, MD;  Location: [facility];  Service: Cardiovascular;  Laterality: N/A;   ESOPHAGOGASTRODUODENOSCOPY N/A 12/26/2023   Procedure: EGD (ESOPHAGOGASTRODUODENOSCOPY);  Surgeon: Maryruth Ole DASEN, MD;  Location: [facility] ENDOSCOPY;  Service: Endoscopy;  Laterality: N/A;   ESOPHAGOGASTRODUODENOSCOPY (EGD) WITH PROPOFOL  N/A 08/07/2015   Procedure: ESOPHAGOGASTRODUODENOSCOPY (EGD) WITH PROPOFOL ;  Surgeon: Gladis RAYMOND Mariner, MD;  Location: [facility] ENDOSCOPY;  Service: Endoscopy;  Laterality: N/A;   ESOPHAGOGASTRODUODENOSCOPY (EGD) WITH PROPOFOL  N/A 09/16/2017   Procedure: ESOPHAGOGASTRODUODENOSCOPY (EGD) WITH PROPOFOL ;  Surgeon: Mariner Gladis RAYMOND, MD;  Location: [facility] ENDOSCOPY;  Service: Endoscopy;  Laterality: N/A;   ESOPHAGOGASTRODUODENOSCOPY (EGD) WITH PROPOFOL  N/A 12/20/2020   Procedure: ESOPHAGOGASTRODUODENOSCOPY (EGD) WITH PROPOFOL ;  Surgeon: Dessa Reyes ORN, MD;  Location: [facility] ENDOSCOPY;  Service: Endoscopy;  Laterality: N/A;   HEMOSTASIS CLIP PLACEMENT  12/26/2023   Procedure: CONTROL OF HEMORRHAGE, GI TRACT, ENDOSCOPIC, BY CLIPPING OR OVERSEWING;  Surgeon: Maryruth Ole DASEN, MD;  Location: [facility] ENDOSCOPY;  Service: Endoscopy;;   IRRIGATION AND DEBRIDEMENT FOOT Right 05/12/2023   Procedure: IRRIGATION AND DEBRIDEMENT FOOT, WASH OUT;  Surgeon: Neill Boas, DPM;  Location: [facility];  Service: Orthopedics/Podiatry;  Laterality: Right;   LEFT HEART CATH AND CORONARY ANGIOGRAPHY Left 04/20/2019   Procedure: LEFT HEART CATH AND CORONARY ANGIOGRAPHY;  Surgeon: Ammon Blunt, MD;  Location: [facility];  Service: Cardiovascular;  Laterality: Left;   LOWER EXTREMITY ANGIOGRAPHY Right 05/09/2023   Procedure: Lower Extremity Angiography;  Surgeon: Jama Cordella MATSU, MD;  Location: [facility];  Service:  Cardiovascular;  Laterality: Right;   POLYPECTOMY  12/26/2023   Procedure: POLYPECTOMY, INTESTINE;  Surgeon: Maryruth Ole DASEN, MD;   Location: [facility] ENDOSCOPY;  Service: Endoscopy;;   TRANSMETATARSAL AMPUTATION Right 05/08/2023   Procedure: TRANSMETATARSAL AMPUTATION;  Surgeon: Neill Boas, DPM;  Location: [facility];  Service: Orthopedics/Podiatry;  Laterality: Right;   Patient Active Problem List   Diagnosis Date Noted   DVT (deep venous thrombosis) (HCC) 02/24/2024   Acute respiratory failure with hypoxia (HCC) 01/21/2024   Saddle embolus of pulmonary artery (HCC) 01/19/2024   Hx of BKA, right (HCC) 09/02/2023   Diabetic foot infection (HCC) 05/07/2023   Sepsis (HCC) 05/07/2023   Chronic back pain 05/07/2023   Anxiety 05/07/2023   Obesity, Class III, BMI 40-49.9 (morbid obesity) (HCC) 05/07/2023   Uncontrolled type 2 diabetes mellitus with hyperglycemia, with long-term current use of insulin  (HCC) 05/07/2023   Genetic testing 04/09/2022   Chronic venous insufficiency 06/29/2020   Lymphedema 06/29/2020   PAD (peripheral artery disease) 05/30/2020   Ankle ulcer, right, with fat layer exposed (HCC) 05/30/2020   Allergy 05/29/2020   Asthma without status asthmaticus 05/29/2020   Uncontrolled type 2 diabetes mellitus with hyperglycemia (HCC) 05/29/2020   S/P coronary artery stent placement 04/30/2019   CAD S/P percutaneous coronary angioplasty 04/20/2019   Abnormal ECG 03/18/2019   Chest pain with high risk for cardiac etiology 03/18/2019   SOB (shortness of breath) on exertion 03/18/2019   Hypercholesterolemia 05/07/2015   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   Hypogonadism in male 11/22/2014   BPH with obstruction/lower urinary tract symptoms 11/22/2014   DDD (degenerative disc disease), lumbar 10/13/2014   Status post lumbar laminectomy 10/13/2014   DJD of shoulder 10/13/2014   Bilateral occipital neuralgia 10/13/2014   Lumbar radiculopathy 10/13/2014   DDD (degenerative disc disease), cervical 10/13/2014   Depression 08/25/2013   HTN (hypertension) 08/25/2013   History of hemorrhoids 08/25/2013    Low back pain 08/25/2013    PCP: Valora Lynwood FALCON, MD   REFERRING PROVIDER: Delores Orvin BRAVO, NP   REFERRING DIAG: [phone] (ICD-10-CM) - Hx of right BKA (HCC)   THERAPY DIAG:  No diagnosis found.  Rationale for Evaluation and Treatment: Rehabilitation  ONSET DATE: 05/15/2023  SUBJECTIVE:   SUBJECTIVE STATEMENT:   Patient reports pain in the residual limb on R. He recently had been dealing with the flu.    PERTINENT HISTORY: Patient is a 58 year old male with R BKA on 05/15/2023 and revision of R BKA on 05/16/2023. PMH: with medical history HTN, HLD, CAD s/p stent, DM with neuropathy, chronic back pain on opiates,, anxiety.  PAIN:  Are you having pain? No  PRECAUTIONS: Fall  RED FLAGS: None   WEIGHT BEARING RESTRICTIONS: No  FALLS:  Has patient fallen in last 6 months? No  LIVING ENVIRONMENT: Lives with: lives with their spouse Lives in: House/apartment Stairs: Ramp to entry Has following equipment at home: Vannie - 2 wheeled, Environmental Consultant - 4 wheeled, Wheelchair (manual), Graybar electric, and Ramped entry, Rollator  OCCUPATION: On disability.  PLOF: Independent  PATIENT GOALS: Patient wants to be able to improve gait and balance with gait.    NEXT MD VISIT: 05/30/2023 January 10th to check for blood clots.   OBJECTIVE:  Note: Objective measures were completed at Evaluation unless otherwise noted.  DIAGNOSTIC FINDINGS: COMPARISON:  CT pulmonary angiogram performed January 19, 2024   FINDINGS: VENOUS   Nonocclusive thrombus is apparent in the right common femoral vein. The imaged portion of the  right greater saphenous vein is patent. Occlusive thrombus is identified within the right superficial femoral vein in the proximal segment. Nonocclusive thrombus is favored in the mid and distal segments. The popliteal vein is noncompressible as are the gastrocnemius/soleal veins. The patient is status post right lower extremity amputation and therefore the tibial  veins were not evaluated.   The left common femoral, femoral, and popliteal veins are patent. There is no evidence of DVT within the tibial veins on the left.   OTHER   None.   Limitations: none   IMPRESSION: 1. Extensive DVT in the right lower extremity which involves the popliteal, femoral, and common femoral veins. 2. No evidence of left lower extremity DVT.   These results will be called to the ordering clinician or representative by the Radiologist Assistant, and communication documented in the PACS or [employer].     Electronically Signed   By: Maude Naegeli M.D.   On: 01/19/2024 12:29  PATIENT SURVEYS:  LEFS  Extreme difficulty/unable (0), Quite a bit of difficulty (1), Moderate difficulty (2), Little difficulty (3), No difficulty (4) Survey date:    Any of your usual work, housework or school activities 1  2. Usual hobbies, recreational or sporting activities 0  3. Getting into/out of the bath 1  4. Walking between rooms 0  5. Putting on socks/shoes 1  6. Squatting  0  7. Lifting an object, like a bag of groceries from the floor 1  8. Performing light activities around your home 1  9. Performing heavy activities around your home 0  10. Getting into/out of a car 1  11. Walking 2 blocks 0  12. Walking 1 mile 0  13. Going up/down 10 stairs (1 flight) 0  14. Standing for 1 hour 0  15.  sitting for 1 hour 4  16. Running on even ground 0  17. Running on uneven ground 0  18. Making sharp turns while running fast 0  19. Hopping  2  20. Rolling over in bed 1  Score total:  13/80     COGNITION: Overall cognitive status: Within functional limits for tasks assessed     SENSATION: Slight decrease in sensation to anterior R tibia.      POSTURE: rounded shoulders, forward head, and increased thoracic kyphosis  PALPATION: No pain with palpation to R distal stump.   LOWER EXTREMITY ROM:  Active ROM Right eval Left eval  Hip flexion    Hip extension     Hip abduction    Hip adduction    Hip internal rotation    Hip external rotation    Knee flexion    Knee extension 9   Ankle dorsiflexion    Ankle plantarflexion    Ankle inversion    Ankle eversion     (Blank rows = not tested)  LOWER EXTREMITY MMT:  MMT Right eval Left eval  Hip flexion 4+ 4  Hip extension 4+ 4+  Hip abduction 4+ 5  Hip adduction    Hip internal rotation    Hip external rotation    Knee flexion 5 4+  Knee extension 5 4+  Ankle dorsiflexion    Ankle plantarflexion    Ankle inversion    Ankle eversion     (Blank rows = not tested)    FUNCTIONAL TESTS:  Timed up and go (TUG): Not safe to perform at evaluation. 10 meter walk test: Not safe to perform at evaluation.  GAIT: Distance walked: 70' x2 once  using // bars and another using FWW.  Assistive device utilized: Environmental Consultant - 2 wheeled and // bars.  Level of assistance: CGA Comments: CGA for safety, decreased stride length, decreased gait speed, lack of R knee extension resulting in lack of R heel strike.                                                                                                                                 TREATMENT DATE: 04/21/2024     -Seated HS stretch with prosthetic on chair and 5 lb. AW on top of R knee. 2'x2     GT training in // bars:  Forward 2x2 laps.  Backwards 2x2 laps Side stepping x2 laps (more difficulty shifting to the prosthetic side).   ---------------------------------------------------------------------------  GT outside of // bars using FWW   - 150' x 2 with CGA for safety and w/c follow     PATIENT EDUCATION:  Education details: POC, HEP, safety with gait/AD at home.  Person educated: Patient Education method: Explanation, Demonstration, and Handouts Education comprehension: verbalized understanding  HOME EXERCISE PROGRAM: Access Code: [email or web address] URL: [email or web address] Date: 04/21/2024 Prepared by: Norman Sharps  Exercises - Long Sitting Quad Set  - 2 x daily - 1 x weekly - 3 sets - 10 reps - 5 seconds hold - Seated Hamstring Stretch with Chair  - 2 x daily - 1 x weekly - 1 sets - 5 reps - 30 second hold - Prone Knee Extension Hang  - 1 x daily - 7 x weekly - 1 sets - 3 reps - 5 minute hold  ASSESSMENT:  CLINICAL IMPRESSION:    Patient began today's session with hamstring stretch seated in chair. He continues to have lack of R knee terminal extension noted causing difficulty with standing/gait. Continued focus on gait training in // bars and with RW in hallway with CGA-minA. Verbal cuing given for heel strike on R, more upright posture, and to keep head up.  He continues to respond well to cuing and show improved mechanics with practice. Overall, patient continues to be very motivated to reach goals and follows cues well for improved performance during PT treatment session.  OBJECTIVE IMPAIRMENTS: Abnormal gait, decreased activity tolerance, decreased balance, decreased coordination, decreased endurance, difficulty walking, decreased ROM, and decreased strength.   ACTIVITY LIMITATIONS: carrying, lifting, bending, standing, squatting, stairs, and transfers  PARTICIPATION LIMITATIONS: meal prep, cleaning, laundry, driving, shopping, community activity, and yard work  PERSONAL FACTORS: Time since onset of injury/illness/exacerbation and 1-2 comorbidities: HTN, HLD, CAD, s/p stent, DM, Chronic LBP, Obesity. are also affecting patient's functional outcome.   REHAB POTENTIAL: Good  CLINICAL DECISION MAKING: Stable/uncomplicated  EVALUATION COMPLEXITY: Moderate   GOALS: Goals reviewed with patient? Yes  SHORT TERM GOALS: Target date:  05/19/2024: Patient will be independent with HEP in 4 weeks allowing him to perform exercises at home with proper mechanics and safety.  Baseline: HEP initiated at evaluation.  Goal status: INITIAL    LONG TERM GOALS: Target date: 07/14/2024  1.  Patient  will improve R knee extension to neutral (0) degrees by discharge allowing improved gait mechanics.  Baseline: 9 degrees from full extension.  Goal status: INITIAL  2.  Patient will improve LEFS outcome score by 12 points or higher by discharge allowing him to perform ADLs with less difficulty.  Baseline: 13/80 Goal status: INITIAL   3.  Patient will reduce timed up and go to <11 seconds to reduce fall risk and demonstrate improved transfer/gait ability. Baseline: Did not perform at evaluation due to decreased safety.  Goal status: INITIAL  4.  Patient will increase 10 meter walk test to >1.65m/s as to improve gait speed for better community ambulation and to reduce fall risk. Baseline: Did not perform at evaluation due to decreased safety.  Goal status: INITIAL  5.  Patient will ambulated > 500 feet with least restrictive AD on all surfaces with modified independence  for improved normalized gait pattern with less chance of fatigue/LBP.  Baseline: EVAL- 20'' in // bars with CGA.  Goal status: INITIAL   PLAN:  PT FREQUENCY: 1-2x/week  PT DURATION: 12 weeks  PLANNED INTERVENTIONS: 97164- PT Re-evaluation, 97750- Physical Performance Testing, 97110-Therapeutic exercises, 97530- Therapeutic activity, [id number]- Neuromuscular re-education, 97535- Self Care, [id number]- Manual therapy, [phone]- Gait training, [phone]- Prosthetic Initial , [phone]- Orthotic/Prosthetic subsequent, Patient/Family education, Balance training, Stair training, DME instructions, and Wheelchair mobility training  PLAN FOR NEXT SESSION:  Work to improve R knee extension ROM.  Continue working to improve standing balance/gait/tolerance to weight bearing through R LE.   Maryanne Finder, PT, DPT Physical Therapist - [facility] 05/17/2024, 1:43 PM  "

## 2024-05-18 ENCOUNTER — Encounter

## 2024-05-19 ENCOUNTER — Ambulatory Visit

## 2024-05-24 ENCOUNTER — Ambulatory Visit: Attending: Nurse Practitioner

## 2024-05-25 ENCOUNTER — Encounter

## 2024-05-26 ENCOUNTER — Ambulatory Visit: Attending: Nurse Practitioner

## 2024-05-26 DIAGNOSIS — M6281 Muscle weakness (generalized): Secondary | ICD-10-CM | POA: Insufficient documentation

## 2024-05-26 DIAGNOSIS — R2689 Other abnormalities of gait and mobility: Secondary | ICD-10-CM | POA: Insufficient documentation

## 2024-05-26 DIAGNOSIS — R278 Other lack of coordination: Secondary | ICD-10-CM | POA: Diagnosis present

## 2024-05-26 DIAGNOSIS — R269 Unspecified abnormalities of gait and mobility: Secondary | ICD-10-CM | POA: Diagnosis present

## 2024-05-26 DIAGNOSIS — R262 Difficulty in walking, not elsewhere classified: Secondary | ICD-10-CM | POA: Insufficient documentation

## 2024-05-26 NOTE — Therapy (Signed)
 " OUTPATIENT PHYSICAL THERAPY LOWER EXTREMITY Treatment   Patient Name: Gerald Ellis MRN: [id number] DOB:11/12/1965, 59 y.o., male Today's Date: 05/26/2024  END OF SESSION:  PT End of Session - 05/26/24 1559     Visit Number 6    Number of Visits 25    Date for Recertification  07/14/24    PT Start Time 1600    PT Stop Time 1646    PT Time Calculation (min) 46 min    Equipment Utilized During Treatment Gait belt    Activity Tolerance Patient tolerated treatment well             Past Medical History:  Diagnosis Date   Benign enlargement of prostate    Bronchitis    Childhood asthma    Diabetes mellitus without complication (HCC)    Environmental allergies    Erectile dysfunction    Headache    Hemorrhoids    Hypercholesteremia    Hypertension    Hypogonadism in male    Lumbago    Over weight    Past Surgical History:  Procedure Laterality Date   AMPUTATION Right 05/15/2023   Procedure: AMPUTATION BELOW KNEE;  Surgeon: Marea Selinda RAMAN, MD;  Location: [facility];  Service: General;  Laterality: Right;   AMPUTATION Right 05/16/2023   Procedure: REVISION OF BELOW KNEE AMPUTATION;  Surgeon: Marea Selinda RAMAN, MD;  Location: [facility];  Service: General;  Laterality: Right;   BACK SURGERY  05/20/1990   CHOLECYSTECTOMY     COLONOSCOPY N/A 12/26/2023   Procedure: COLONOSCOPY;  Surgeon: Maryruth Ole DASEN, MD;  Location: [facility] ENDOSCOPY;  Service: Endoscopy;  Laterality: N/A;   COLONOSCOPY WITH PROPOFOL  N/A 08/07/2015   Procedure: COLONOSCOPY WITH PROPOFOL ;  Surgeon: Gladis RAYMOND Mariner, MD;  Location: [facility] ENDOSCOPY;  Service: Endoscopy;  Laterality: N/A;   COLONOSCOPY WITH PROPOFOL  N/A 08/08/2015   Procedure: COLONOSCOPY WITH PROPOFOL ;  Surgeon: Gladis RAYMOND Mariner, MD;  Location: [facility] ENDOSCOPY;  Service: Endoscopy;  Laterality: N/A;   COLONOSCOPY WITH PROPOFOL  N/A 09/16/2017   Procedure: COLONOSCOPY WITH PROPOFOL ;  Surgeon: Mariner Gladis RAYMOND, MD;  Location: [facility] ENDOSCOPY;   Service: Endoscopy;  Laterality: N/A;   COLONOSCOPY WITH PROPOFOL  N/A 12/20/2020   Procedure: COLONOSCOPY WITH PROPOFOL ;  Surgeon: Dessa Reyes ORN, MD;  Location: [facility] ENDOSCOPY;  Service: Endoscopy;  Laterality: N/A;  IDDM   CORONARY STENT INTERVENTION N/A 04/20/2019   Procedure: CORONARY STENT INTERVENTION;  Surgeon: Ammon Blunt, MD;  Location: [facility] IN[facility];  Service: Cardiovascular;  Laterality: N/A;   ESOPHAGOGASTRODUODENOSCOPY N/A 12/26/2023   Procedure: EGD (ESOPHAGOGASTRODUODENOSCOPY);  Surgeon: Maryruth Ole DASEN, MD;  Location: [facility] ENDOSCOPY;  Service: Endoscopy;  Laterality: N/A;   ESOPHAGOGASTRODUODENOSCOPY (EGD) WITH PROPOFOL  N/A 08/07/2015   Procedure: ESOPHAGOGASTRODUODENOSCOPY (EGD) WITH PROPOFOL ;  Surgeon: Gladis RAYMOND Mariner, MD;  Location: [facility] ENDOSCOPY;  Service: Endoscopy;  Laterality: N/A;   ESOPHAGOGASTRODUODENOSCOPY (EGD) WITH PROPOFOL  N/A 09/16/2017   Procedure: ESOPHAGOGASTRODUODENOSCOPY (EGD) WITH PROPOFOL ;  Surgeon: Mariner Gladis RAYMOND, MD;  Location: [facility] ENDOSCOPY;  Service: Endoscopy;  Laterality: N/A;   ESOPHAGOGASTRODUODENOSCOPY (EGD) WITH PROPOFOL  N/A 12/20/2020   Procedure: ESOPHAGOGASTRODUODENOSCOPY (EGD) WITH PROPOFOL ;  Surgeon: Dessa Reyes ORN, MD;  Location: [facility] ENDOSCOPY;  Service: Endoscopy;  Laterality: N/A;   HEMOSTASIS CLIP PLACEMENT  12/26/2023   Procedure: CONTROL OF HEMORRHAGE, GI TRACT, ENDOSCOPIC, BY CLIPPING OR OVERSEWING;  Surgeon: Maryruth Ole DASEN, MD;  Location: [facility] ENDOSCOPY;  Service: Endoscopy;;   IRRIGATION AND DEBRIDEMENT FOOT Right 05/12/2023   Procedure: IRRIGATION AND DEBRIDEMENT FOOT, WASH OUT;  Surgeon: Neill Boas, DPM;  Location: [facility];  Service: Orthopedics/Podiatry;  Laterality: Right;   LEFT HEART CATH AND CORONARY ANGIOGRAPHY Left 04/20/2019   Procedure: LEFT HEART CATH AND CORONARY ANGIOGRAPHY;  Surgeon: Ammon Blunt, MD;  Location: [facility];  Service: Cardiovascular;  Laterality:  Left;   LOWER EXTREMITY ANGIOGRAPHY Right 05/09/2023   Procedure: Lower Extremity Angiography;  Surgeon: Jama Cordella MATSU, MD;  Location: [facility];  Service: Cardiovascular;  Laterality: Right;   POLYPECTOMY  12/26/2023   Procedure: POLYPECTOMY, INTESTINE;  Surgeon: Maryruth Ole DASEN, MD;  Location: [facility] ENDOSCOPY;  Service: Endoscopy;;   TRANSMETATARSAL AMPUTATION Right 05/08/2023   Procedure: TRANSMETATARSAL AMPUTATION;  Surgeon: Neill Boas, DPM;  Location: [facility];  Service: Orthopedics/Podiatry;  Laterality: Right;   Patient Active Problem List   Diagnosis Date Noted   DVT (deep venous thrombosis) (HCC) 02/24/2024   Acute respiratory failure with hypoxia (HCC) 01/21/2024   Saddle embolus of pulmonary artery (HCC) 01/19/2024   Hx of BKA, right (HCC) 09/02/2023   Diabetic foot infection (HCC) 05/07/2023   Sepsis (HCC) 05/07/2023   Chronic back pain 05/07/2023   Anxiety 05/07/2023   Obesity, Class III, BMI 40-49.9 (morbid obesity) (HCC) 05/07/2023   Uncontrolled type 2 diabetes mellitus with hyperglycemia, with long-term current use of insulin  (HCC) 05/07/2023   Genetic testing 04/09/2022   Chronic venous insufficiency 06/29/2020   Lymphedema 06/29/2020   PAD (peripheral artery disease) 05/30/2020   Ankle ulcer, right, with fat layer exposed (HCC) 05/30/2020   Allergy 05/29/2020   Asthma without status asthmaticus 05/29/2020   Uncontrolled type 2 diabetes mellitus with hyperglycemia (HCC) 05/29/2020   S/P coronary artery stent placement 04/30/2019   CAD S/P percutaneous coronary angioplasty 04/20/2019   Abnormal ECG 03/18/2019   Chest pain with high risk for cardiac etiology 03/18/2019   SOB (shortness of breath) on exertion 03/18/2019   Hypercholesterolemia 05/07/2015   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   Hypogonadism in male 11/22/2014   BPH with obstruction/lower urinary tract symptoms 11/22/2014   DDD (degenerative disc disease), lumbar  10/13/2014   Status post lumbar laminectomy 10/13/2014   DJD of shoulder 10/13/2014   Bilateral occipital neuralgia 10/13/2014   Lumbar radiculopathy 10/13/2014   DDD (degenerative disc disease), cervical 10/13/2014   Depression 08/25/2013   HTN (hypertension) 08/25/2013   History of hemorrhoids 08/25/2013   Low back pain 08/25/2013    PCP: Valora Lynwood FALCON, MD   REFERRING PROVIDER: Delores Orvin BRAVO, NP   REFERRING DIAG: [phone] (ICD-10-CM) - Hx of right BKA (HCC)   THERAPY DIAG:  Abnormality of gait and mobility  Other abnormalities of gait and mobility  Difficulty in walking, not elsewhere classified  Muscle weakness (generalized)  Other lack of coordination  Rationale for Evaluation and Treatment: Rehabilitation  ONSET DATE: 05/15/2023  SUBJECTIVE:   SUBJECTIVE STATEMENT:   Patient reports doing okay today. He reports that he is still recovering as he had the flu last week, but is feeling better. He reports he has been improving and the R leg seems to be more stable and locking better than before.    PERTINENT HISTORY: Patient is a 59 year old male with R BKA on 05/15/2023 and revision of R BKA on 05/16/2023. PMH: with medical history HTN, HLD, CAD s/p stent, DM with neuropathy, chronic back pain on opiates,, anxiety.  PAIN:  Are you having pain? No  PRECAUTIONS: Fall  RED FLAGS: None   WEIGHT BEARING RESTRICTIONS: No  FALLS:  Has patient fallen in last 6 months? No  LIVING ENVIRONMENT: Lives with: lives with their spouse Lives in: House/apartment Stairs: Ramp to entry Has following equipment at home: Vannie - 2 wheeled, Environmental Consultant - 4 wheeled, Wheelchair (manual), Graybar electric, and Ramped entry, Rollator  OCCUPATION: On disability.  PLOF: Independent  PATIENT GOALS: Patient wants to be able to improve gait and balance with gait.    NEXT MD VISIT: 05/30/2023 January 10th to check for blood clots.   OBJECTIVE:  Note: Objective measures were  completed at Evaluation unless otherwise noted.  DIAGNOSTIC FINDINGS: COMPARISON:  CT pulmonary angiogram performed January 19, 2024   FINDINGS: VENOUS   Nonocclusive thrombus is apparent in the right common femoral vein. The imaged portion of the right greater saphenous vein is patent. Occlusive thrombus is identified within the right superficial femoral vein in the proximal segment. Nonocclusive thrombus is favored in the mid and distal segments. The popliteal vein is noncompressible as are the gastrocnemius/soleal veins. The patient is status post right lower extremity amputation and therefore the tibial veins were not evaluated.   The left common femoral, femoral, and popliteal veins are patent. There is no evidence of DVT within the tibial veins on the left.   OTHER   None.   Limitations: none   IMPRESSION: 1. Extensive DVT in the right lower extremity which involves the popliteal, femoral, and common femoral veins. 2. No evidence of left lower extremity DVT.   These results will be called to the ordering clinician or representative by the Radiologist Assistant, and communication documented in the PACS or [employer].     Electronically Signed   By: Maude Naegeli M.D.   On: 01/19/2024 12:29  PATIENT SURVEYS:  LEFS  Extreme difficulty/unable (0), Quite a bit of difficulty (1), Moderate difficulty (2), Little difficulty (3), No difficulty (4) Survey date:    Any of your usual work, housework or school activities 1  2. Usual hobbies, recreational or sporting activities 0  3. Getting into/out of the bath 1  4. Walking between rooms 0  5. Putting on socks/shoes 1  6. Squatting  0  7. Lifting an object, like a bag of groceries from the floor 1  8. Performing light activities around your home 1  9. Performing heavy activities around your home 0  10. Getting into/out of a car 1  11. Walking 2 blocks 0  12. Walking 1 mile 0  13. Going up/down 10 stairs (1  flight) 0  14. Standing for 1 hour 0  15.  sitting for 1 hour 4  16. Running on even ground 0  17. Running on uneven ground 0  18. Making sharp turns while running fast 0  19. Hopping  2  20. Rolling over in bed 1  Score total:  13/80     COGNITION: Overall cognitive status: Within functional limits for tasks assessed     SENSATION: Slight decrease in sensation to anterior R tibia.      POSTURE: rounded shoulders, forward head, and increased thoracic kyphosis  PALPATION: No pain with palpation to R distal stump.   LOWER EXTREMITY ROM:  Active ROM Right eval Left eval  Hip flexion    Hip extension    Hip abduction    Hip adduction    Hip internal rotation    Hip external rotation    Knee flexion    Knee extension 9   Ankle dorsiflexion    Ankle plantarflexion    Ankle  inversion    Ankle eversion     (Blank rows = not tested)  LOWER EXTREMITY MMT:  MMT Right eval Left eval  Hip flexion 4+ 4  Hip extension 4+ 4+  Hip abduction 4+ 5  Hip adduction    Hip internal rotation    Hip external rotation    Knee flexion 5 4+  Knee extension 5 4+  Ankle dorsiflexion    Ankle plantarflexion    Ankle inversion    Ankle eversion     (Blank rows = not tested)    FUNCTIONAL TESTS:  Timed up and go (TUG): Not safe to perform at evaluation. 10 meter walk test: Not safe to perform at evaluation.  GAIT: Distance walked: 51' x2 once using // bars and another using FWW.  Assistive device utilized: Environmental Consultant - 2 wheeled and // bars.  Level of assistance: CGA Comments: CGA for safety, decreased stride length, decreased gait speed, lack of R knee extension resulting in lack of R heel strike.                                                                                                                                 TREATMENT DATE: 04/21/2024     -Seated HS stretch with prosthetic on chair and 5 lb. AW on top of R knee x5 minutes.     GT training in // bars:   Forward 4 laps Backwards 4 laps Side stepping x2 laps (more difficulty shifting to the prosthetic side with slight increase in pain).   Stepping over 3 1/2 rolls in // bars with UE support x3 laps. (More difficulty leading with L leg due to difficulty clearing prosthetic without circumducting).  ---------------------------------------------------------------------------  GT outside of // bars using FWW   - 147' x1 with CGA for safety.  - 12' x2 with CGA for safety.   -Figure 8 around 3 cones x2 laps set 12' apart.   PATIENT EDUCATION:  Education details: POC, HEP, safety with gait/AD at home.  Person educated: Patient Education method: Explanation, Demonstration, and Handouts Education comprehension: verbalized understanding  HOME EXERCISE PROGRAM: Access Code: [email or web address] URL: [email or web address] Date: 04/21/2024 Prepared by: Norman Sharps  Exercises - Long Sitting Quad Set  - 2 x daily - 1 x weekly - 3 sets - 10 reps - 5 seconds hold - Seated Hamstring Stretch with Chair  - 2 x daily - 1 x weekly - 1 sets - 5 reps - 30 second hold - Prone Knee Extension Hang  - 1 x daily - 7 x weekly - 1 sets - 3 reps - 5 minute hold  ASSESSMENT:  CLINICAL IMPRESSION:    Patient continues to demonstrate decreased extension in R knee therefore R hamstring stretch performed with leg propped in chair prior to walking. Patient demonstrated good improvement today in gait distance walking 150 x1 and 27' x2 using FWW with CGA for safety. He also progressed today walking over  obstacles in // bars and practiced walking around obstacles outside of // bars using FWW.  He continues to respond well to cuing and show improved mechanics with practice. Overall, patient continues to be very motivated to reach goals and follows cues well for improved performance during PT treatment session.  OBJECTIVE IMPAIRMENTS: Abnormal gait, decreased activity tolerance, decreased balance, decreased coordination,  decreased endurance, difficulty walking, decreased ROM, and decreased strength.   ACTIVITY LIMITATIONS: carrying, lifting, bending, standing, squatting, stairs, and transfers  PARTICIPATION LIMITATIONS: meal prep, cleaning, laundry, driving, shopping, community activity, and yard work  PERSONAL FACTORS: Time since onset of injury/illness/exacerbation and 1-2 comorbidities: HTN, HLD, CAD, s/p stent, DM, Chronic LBP, Obesity. are also affecting patient's functional outcome.   REHAB POTENTIAL: Good  CLINICAL DECISION MAKING: Stable/uncomplicated  EVALUATION COMPLEXITY: Moderate   GOALS: Goals reviewed with patient? Yes  SHORT TERM GOALS: Target date:  05/19/2024: Patient will be independent with HEP in 4 weeks allowing him to perform exercises at home with proper mechanics and safety.  Baseline: HEP initiated at evaluation.  Goal status: INITIAL    LONG TERM GOALS: Target date: 07/14/2024  1.  Patient will improve R knee extension to neutral (0) degrees by discharge allowing improved gait mechanics.  Baseline: 9 degrees from full extension.  Goal status: INITIAL  2.  Patient will improve LEFS outcome score by 12 points or higher by discharge allowing him to perform ADLs with less difficulty.  Baseline: 13/80 Goal status: INITIAL   3.  Patient will reduce timed up and go to <11 seconds to reduce fall risk and demonstrate improved transfer/gait ability. Baseline: Did not perform at evaluation due to decreased safety.  Goal status: INITIAL  4.  Patient will increase 10 meter walk test to >1.36m/s as to improve gait speed for better community ambulation and to reduce fall risk. Baseline: Did not perform at evaluation due to decreased safety.  Goal status: INITIAL  5.  Patient will ambulated > 500 feet with least restrictive AD on all surfaces with modified independence  for improved normalized gait pattern with less chance of fatigue/LBP.  Baseline: EVAL- 20'' in // bars with CGA.   Goal status: INITIAL   PLAN:  PT FREQUENCY: 1-2x/week  PT DURATION: 12 weeks  PLANNED INTERVENTIONS: 97164- PT Re-evaluation, 97750- Physical Performance Testing, 97110-Therapeutic exercises, 97530- Therapeutic activity, [id number]- Neuromuscular re-education, 97535- Self Care, [id number]- Manual therapy, [phone]- Gait training, [phone]- Prosthetic Initial , [phone]- Orthotic/Prosthetic subsequent, Patient/Family education, Balance training, Stair training, DME instructions, and Wheelchair mobility training  PLAN FOR NEXT SESSION:  Work to improve R knee extension ROM.  Continue working to improve standing balance/gait/tolerance to weight bearing through R LE.   Norman Sharps, PT, DPT Physical Therapist - [facility]  05/26/2024, 4:54 PM  "

## 2024-05-27 ENCOUNTER — Encounter

## 2024-05-31 ENCOUNTER — Ambulatory Visit

## 2024-06-02 ENCOUNTER — Encounter (HOSPITAL_COMMUNITY): Payer: Self-pay

## 2024-06-02 ENCOUNTER — Other Ambulatory Visit: Payer: Self-pay

## 2024-06-02 ENCOUNTER — Emergency Department (HOSPITAL_COMMUNITY)

## 2024-06-02 ENCOUNTER — Emergency Department (HOSPITAL_COMMUNITY)
Admission: EM | Admit: 2024-06-02 | Discharge: 2024-06-02 | Disposition: A | Discharge status: Home / Self Care | Attending: Emergency Medicine | Admitting: Emergency Medicine

## 2024-06-02 ENCOUNTER — Ambulatory Visit

## 2024-06-02 DIAGNOSIS — J189 Pneumonia, unspecified organism: Secondary | ICD-10-CM

## 2024-06-02 HISTORY — DX: Acute embolism and thrombosis of unspecified deep veins of unspecified lower extremity: I82.409

## 2024-06-02 HISTORY — DX: Other pulmonary embolism without acute cor pulmonale: I26.99

## 2024-06-02 LAB — COMPREHENSIVE METABOLIC PANEL WITH GFR
ALT: 15 U/L (ref 0–44)
AST: 26 U/L (ref 15–41)
Albumin: 3.5 g/dL (ref 3.5–5.0)
Alkaline Phosphatase: 116 U/L (ref 38–126)
Anion gap: 12 (ref 5–15)
BUN: 8 mg/dL (ref 6–20)
CO2: 29 mmol/L (ref 22–32)
Calcium: 8.7 mg/dL — ABNORMAL LOW (ref 8.9–10.3)
Chloride: 99 mmol/L (ref 98–111)
Creatinine, Ser: 0.84 mg/dL (ref 0.61–1.24)
GFR, Estimated: >60 mL/min
Glucose, Bld: 297 mg/dL — ABNORMAL HIGH (ref 70–99)
Potassium: 3.5 mmol/L (ref 3.5–5.1)
Sodium: 140 mmol/L (ref 135–145)
Total Bilirubin: 0.4 mg/dL (ref 0.0–1.2)
Total Protein: 7.5 g/dL (ref 6.5–8.1)

## 2024-06-02 LAB — CBC WITH DIFFERENTIAL/PLATELET
Abs Immature Granulocytes: 0.02 K/uL (ref 0.00–0.07)
Basophils Absolute: 0.1 K/uL (ref 0.0–0.1)
Basophils Relative: 1 %
Eosinophils Absolute: 0.5 K/uL (ref 0.0–0.5)
Eosinophils Relative: 5 %
HCT: 40.3 % (ref 39.0–52.0)
Hemoglobin: 13.5 g/dL (ref 13.0–17.0)
Immature Granulocytes: 0 %
Lymphocytes Relative: 21 %
Lymphs Abs: 2.2 K/uL (ref 0.7–4.0)
MCH: 27.8 pg (ref 26.0–34.0)
MCHC: 33.5 g/dL (ref 30.0–36.0)
MCV: 83.1 fL (ref 80.0–100.0)
Monocytes Absolute: 0.6 K/uL (ref 0.1–1.0)
Monocytes Relative: 6 %
Neutro Abs: 7.1 K/uL (ref 1.7–7.7)
Neutrophils Relative %: 67 %
Platelets: 394 K/uL (ref 150–400)
RBC: 4.85 MIL/uL (ref 4.22–5.81)
RDW: 12.9 % (ref 11.5–15.5)
WBC: 10.4 K/uL (ref 4.0–10.5)
nRBC: 0 % (ref 0.0–0.2)

## 2024-06-02 LAB — URINALYSIS, ROUTINE W REFLEX MICROSCOPIC
Bacteria, UA: NONE SEEN
Bilirubin Urine: NEGATIVE
Glucose, UA: >=500 mg/dL — AB
Hgb urine dipstick: NEGATIVE
Ketones, ur: NEGATIVE mg/dL
Leukocytes,Ua: NEGATIVE
Nitrite: NEGATIVE
Protein, ur: >=300 mg/dL — AB
Specific Gravity, Urine: 1.022 (ref 1.005–1.030)
pH: 8 (ref 5.0–8.0)

## 2024-06-02 LAB — TROPONIN T, HIGH SENSITIVITY
Troponin T High Sensitivity: 45 ng/L — ABNORMAL HIGH (ref 0–19)
Troponin T High Sensitivity: 39 ng/L — ABNORMAL HIGH (ref 0–19)

## 2024-06-02 LAB — CBG MONITORING, ED: Glucose-Capillary: 312 mg/dL — ABNORMAL HIGH (ref 70–99)

## 2024-06-02 LAB — PRO BRAIN NATRIURETIC PEPTIDE: Pro Brain Natriuretic Peptide: 846 pg/mL — ABNORMAL HIGH

## 2024-06-02 MED ORDER — IPRATROPIUM-ALBUTEROL 0.5-2.5 (3) MG/3ML IN SOLN
3.0000 mL | Freq: Once | RESPIRATORY_TRACT | Status: AC
  Administered 2024-06-02: 3 mL via RESPIRATORY_TRACT
  Filled 2024-06-02: qty 3

## 2024-06-02 MED ORDER — IOHEXOL 350 MG/ML SOLN
75.0000 mL | Freq: Once | INTRAVENOUS | Status: AC | PRN
  Administered 2024-06-02: 75 mL via INTRAVENOUS

## 2024-06-02 MED ORDER — AMOXICILLIN-POT CLAVULANATE 875-125 MG PO TABS: 1.0000 | ORAL_TABLET | Freq: Two times a day (BID) | ORAL | 0 refills | Status: AC

## 2024-06-02 MED ORDER — AMOXICILLIN-POT CLAVULANATE 875-125 MG PO TABS
1.0000 | ORAL_TABLET | Freq: Once | ORAL | Status: AC
  Administered 2024-06-02: 1 via ORAL
  Filled 2024-06-02: qty 1

## 2024-06-02 MED ORDER — BUDESONIDE-FORMOTEROL FUMARATE 80-4.5 MCG/ACT IN AERO: 2.0000 | INHALATION_SPRAY | Freq: Every day | RESPIRATORY_TRACT | 1 refills | Status: AC | PRN

## 2024-06-02 MED ORDER — DOXYCYCLINE HYCLATE 100 MG PO TABS
100.0000 mg | ORAL_TABLET | Freq: Once | ORAL | Status: AC
  Administered 2024-06-02: 100 mg via ORAL
  Filled 2024-06-02: qty 1

## 2024-06-02 MED ORDER — DOXYCYCLINE HYCLATE 100 MG PO CAPS: 100.0000 mg | ORAL_CAPSULE | Freq: Two times a day (BID) | ORAL | 0 refills | Status: AC

## 2024-06-02 MED ORDER — ALBUTEROL SULFATE HFA 108 (90 BASE) MCG/ACT IN AERS: 2.0000 | INHALATION_SPRAY | RESPIRATORY_TRACT | 0 refills | Status: AC | PRN

## 2024-06-02 NOTE — Discharge Instructions (Addendum)
 You are seen in the ER today for cough and shortness of breath for the past 2 weeks.  Fortunately you did not have a new blood clot.  Your CT scan showed a residual clot that is chronic appearing, likely atypical no pneumonia and inflamed lymph nodes in your chest.  You are treating you with 2 antibiotics for the infection, and albuterol  to help with your shortness of breath.  The rest of your workup was reassuring.  Is important for you to have a repeat CT scan in about 3 months to recheck of the lymph nodes to make sure they have not gotten bigger.   You also have extensive coronary artery calcifications, mild enlarged heart and arterial hypertension.  Follow-up closely with your cardiologist for these issues.  Follow close with your primary care doctor for a blood pressure recheck, continue taking your diabetes medicines and have your PCP recheck this as well as it was elevated today.  Come back to the emergency room for any new or worsening symptoms.  Take your blood pressure medication when you get home since your blood pressure is high and you have not taken that yet today.

## 2024-06-02 NOTE — ED Provider Notes (Cosign Needed)
 " [facility] AT [facility] Provider Note   CSN: [id number] Arrival date & time: 06/02/24  1730     Patient presents with: Cough   Gerald Ellis is a 59 y.o. male.  Does ER today for cough and shortness of breath x 2 weeks, he had negative COVID and flu testing but symptoms have been persistent.  He is having shortness of breath with a cough.  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  He states his cough is sometimes productive of clear sputum, no hemoptysis.  No chest pain, no fevers or chills.  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    Cough      Prior to Admission medications  Medication Sig Start Date End Date Taking? Authorizing Provider  albuterol  (VENTOLIN  HFA) 108 (90 Base) MCG/ACT inhaler Inhale 1-2 puffs into the lungs every 6 (six) hours as needed for wheezing or shortness of breath. 01/21/24   Evonnie Lenis, MD  apixaban  (ELIQUIS ) 5 MG TABS tablet Take 2 tablets (10 mg total) by mouth 2 (two) times daily for 7 days, THEN 1 tablet (5 mg total) 2 (two) times daily. 01/21/24 07/26/24  Evonnie Lenis, MD  aspirin  81 MG chewable tablet Chew 1 tablet (81 mg total) by mouth daily. 04/21/19   Clarisa Kung, PA-C  atorvastatin  (LIPITOR ) 80 MG tablet Take 1 tablet (80 mg total) by mouth daily at 6 PM. 04/21/19   Clarisa Kung, PA-C  budesonide -formoterol  (SYMBICORT ) 80-4.5 MCG/ACT inhaler Inhale 2 puffs into the lungs daily as needed (SOB). 01/21/24   Evonnie Lenis, MD  cetirizine (ZYRTEC) 10 MG tablet Take 10 mg by mouth daily.    [provider]  Continuous Glucose Sensor (FREESTYLE LIBRE 2 SENSOR) MISC USE 1 KIT EVERY 14 (FOURTEEN) DAYS FOR GLUCOSE MONITORING 05/12/23   [provider]  fluticasone  (FLONASE ) 50 MCG/ACT nasal  spray Place 1 spray into both nostrils 2 (two) times daily as needed (nasal congestion).    [provider]  gabapentin  (NEURONTIN ) 600 MG tablet Take 600 mg by mouth 3 (three) times daily.    [provider]  HUMALOG KWIKPEN 200 UNIT/ML KwikPen Inject 50 Units into the skin with breakfast, with lunch, and with evening meal. 06/12/23   [provider]  insulin  degludec (TRESIBA  FLEXTOUCH) 200 UNIT/ML FlexTouch Pen Inject 90 Units into the skin daily at 10 pm. 01/21/24   Tat, Lenis, MD  isosorbide  mononitrate (IMDUR ) 30 MG 24 hr tablet Take 30 mg by mouth daily.    [provider]  lisinopril  (ZESTRIL ) 20 MG tablet Take 20 mg by mouth daily.    [provider]  metFORMIN (GLUCOPHAGE) 1000 MG tablet Take 1,000 mg by mouth 2 (two) times daily.     [provider]  metoprolol  succinate (TOPROL -XL) 50 MG 24 hr tablet Take 1 tablet (50 mg total) by mouth daily. Take with or immediately following a meal. 01/22/24   Tat, Lenis, MD  naloxone [facility]) nasal spray 4 mg/0.1 mL Place 0.4 mg into the nose once. 06/05/23   [provider]  oxyCODONE -acetaminophen  (PERCOCET) 10-325 MG tablet Take 1 tablet by mouth every 6 (six) hours as needed for pain.    [provider]  pantoprazole  (PROTONIX ) 40 MG tablet  Take 1 tablet (40 mg total) by mouth daily. 04/25/23 04/24/24  Lang Dover, MD  pioglitazone  (ACTOS ) 15 MG tablet Take 15 mg by mouth 2 (two) times daily.    [provider]  tadalafil (CIALIS) 20 MG tablet Take 20 mg by mouth daily as needed for erectile dysfunction.    [provider]    Allergies: Ibuprofen, Nsaids, and Fish oil    Review of Systems  Respiratory:  Positive for cough.     Updated Vital Signs BP (!) 167/99 (BP Location: Right Arm)   Pulse 98   Temp 98.4 F (36.9 C) (Oral)   Resp 17   Ht 5' 10 (1.778 m)   Wt 108.9 kg   SpO2 97%   BMI 34.44 kg/m   Physical Exam Vitals and nursing note  reviewed.  Constitutional:      General: He is not in acute distress.    Appearance: He is well-developed.  HENT:     Head: Normocephalic and atraumatic.     Mouth/Throat:     Mouth: Mucous membranes are moist.  Eyes:     Conjunctiva/sclera: Conjunctivae normal.  Cardiovascular:     Rate and Rhythm: Normal rate and regular rhythm.     Heart sounds: No murmur heard. Pulmonary:     Effort: Pulmonary effort is normal. No respiratory distress.     Breath sounds: Rhonchi present.  Abdominal:     Palpations: Abdomen is soft.     Tenderness: There is no abdominal tenderness.  Musculoskeletal:        General: No swelling or tenderness.     Cervical back: Neck supple.     Right lower leg: No edema.     Left lower leg: No edema.     Comments: R bka,   Skin:    General: Skin is warm and dry.     Capillary Refill: Capillary refill takes less than 2 seconds.  Neurological:     General: No focal deficit present.     Mental Status: He is alert and oriented to person, place, and time.     Sensory: No sensory deficit.     Motor: No weakness.  Psychiatric:        Mood and Affect: Mood normal.     (all labs ordered are listed, but only abnormal results are displayed) Labs Reviewed  COMPREHENSIVE METABOLIC PANEL WITH GFR - Abnormal; Notable for the following components:      Result Value   Glucose, Bld 297 (*)    Calcium  8.7 (*)    All other components within normal limits  CBG MONITORING, ED - Abnormal; Notable for the following components:   Glucose-Capillary 312 (*)    All other components within normal limits  TROPONIN T, HIGH SENSITIVITY - Abnormal; Notable for the following components:   Troponin T High Sensitivity 45 (*)    All other components within normal limits  TROPONIN T, HIGH SENSITIVITY - Abnormal; Notable for the following components:   Troponin T High Sensitivity 39 (*)    All other components within normal limits  CBC WITH DIFFERENTIAL/PLATELET  URINALYSIS,  ROUTINE W REFLEX MICROSCOPIC  PRO BRAIN NATRIURETIC PEPTIDE    EKG: None  Radiology: DG Chest 2 View Result Date: 06/02/2024 EXAM: 2 VIEW(S) XRAY OF THE CHEST 06/02/2024 06:01:00 PM COMPARISON: None available. CLINICAL HISTORY: SOB, cough FINDINGS: LUNGS AND PLEURA: No focal pulmonary opacity. No pleural effusion. No pneumothorax. HEART AND MEDIASTINUM: No acute abnormality of the cardiac and mediastinal silhouettes.  Coronary artery stent. BONES AND SOFT TISSUES: No acute osseous abnormality. IMPRESSION: 1. No acute process. Electronically signed by: Morgane Naveau MD 06/02/2024 06:04 PM EST RP Workstation: [id number]     Procedures   Medications Ordered in the ED  ipratropium-albuterol  (DUONEB) 0.5-2.5 (3) MG/3ML nebulizer solution 3 mL (has no administration in time range)                                    Medical Decision Making This patient presents to the ED for concern of cough and shortness of breath, this involves an extensive number of treatment options, and is a complaint that carries with it a high risk of complications and morbidity.  The differential diagnosis includes ACS, PE, pneumonia, bronchitis, heart failure, acute anemia, other   Co morbidities that complicate the patient evaluation :   Pulmonary hypertension, diabetes, CAD, PE, chronic anticoagulation   Additional history obtained:  Additional history obtained from EMR External records from outside source obtained and reviewed including prior notes, labs and imaging   Lab Tests:  I Ordered, and personally interpreted labs.  The pertinent results include: Troponin delta of 6 downtrending; UA without infection, CMP with elevated glucose at 297 but no increased anion gap, CBC normal, proBNP within range for patient's age   Imaging Studies ordered:  I ordered imaging studies including chest x-ray which shows no pulmonary edema or infiltrate, CTA chest shows small mild residual bleb of clot in left lower  lobar pulmonary artery consistent with chronic PE, no acute PE, there is mediastinal and bilateral hilar adenopathy with multiple lymph nodes measuring up to 18 mm, bronchial wall thickening with some patchy infiltrates and air trapping I independently visualized and interpreted imaging within scope of identifying emergent findings  I agree with the radiologist interpretation   Cardiac Monitoring: / EKG:  The patient was maintained on a cardiac monitor.  I personally viewed and interpreted the cardiac monitored which showed an underlying rhythm of: Sinus rhythm     Problem List / ED Course / Critical interventions / Medication management  Persistent productive cough and shortness of breath x 2 weeks.  Patient had azithromycin and had temporary improvement but after he stopped this symptoms returned.  He is worried about recurrent PE despite implants with his anticoagulants.  CTA chest ordered to further evaluate, there is no cute PE but he does have mediastinal and hilar adenopathy and bronchial wall thickening with air trapping, no symptoms of productive cough I suspect he likely has a pneumonia, will treat with doxycycline  and Augmentin  at this time.  He was given DuoNeb with improvement of his symptoms, oxygen  saturation has been 94 to 97% on the monitor, he is not having any shortness of breath.  We discussed the hilar adenopathy and that he was recommended to have repeat CT or PET scan in 3 months to assess for stability.  He is feeling much better, requesting discharge at this time.  Blood pressure was elevated today, but patient states he is not taking his medications.  He was instructed to take his medication and to go home and follow-up with his PCP for blood pressure recheck as well. I ordered medication including duoneb  for sob  Reevaluation of the patient after these medicines showed that the patient improved I have reviewed the patients home medicines and have made adjustments as  needed      Amount and/or Complexity of  Data Reviewed Labs: ordered. Radiology: ordered.  Risk Prescription drug management.        Final diagnoses:  None    ED Discharge Orders     None          Suellen Sherran LABOR, [place] 06/03/24 1323  "

## 2024-06-02 NOTE — ED Triage Notes (Signed)
 Pt arrived via POV c/o persistent cough X 2 weeks. Pt saw his PCP a week ago and received negative Flu and Covid results however Pt reports concern for possible reoccurrence of blood clots in his longs. Pt on blood thinners and reports finishing recently prescribed medications w/o relief.

## 2024-06-03 ENCOUNTER — Ambulatory Visit (INDEPENDENT_AMBULATORY_CARE_PROVIDER_SITE_OTHER)

## 2024-06-03 ENCOUNTER — Ambulatory Visit (INDEPENDENT_AMBULATORY_CARE_PROVIDER_SITE_OTHER): Admitting: Nurse Practitioner

## 2024-06-03 ENCOUNTER — Encounter (INDEPENDENT_AMBULATORY_CARE_PROVIDER_SITE_OTHER): Payer: Self-pay | Admitting: Nurse Practitioner

## 2024-06-03 VITALS — BP 161/82 | HR 96 | Resp 16 | Ht 70.0 in

## 2024-06-03 DIAGNOSIS — I739 Peripheral vascular disease, unspecified: Secondary | ICD-10-CM

## 2024-06-03 DIAGNOSIS — Z89511 Acquired absence of right leg below knee: Secondary | ICD-10-CM | POA: Diagnosis not present

## 2024-06-03 NOTE — Progress Notes (Signed)
 Gerald Ellis                                          MRN: [id number]   06/03/2024   The [facility] Quality Team Specialist reviewed this patient medical record for the purposes of chart review for care gap closure. The following were reviewed: chart review for care gap closure-kidney health evaluation for diabetes:eGFR  and uACR.    [facility] Quality Team

## 2024-06-07 ENCOUNTER — Ambulatory Visit

## 2024-06-07 LAB — VAS US ABI WITH/WO TBI: Left ABI: 1.05

## 2024-06-09 ENCOUNTER — Ambulatory Visit

## 2024-06-12 ENCOUNTER — Encounter (INDEPENDENT_AMBULATORY_CARE_PROVIDER_SITE_OTHER): Payer: Self-pay | Admitting: Nurse Practitioner

## 2024-06-12 NOTE — Progress Notes (Signed)
 "  Subjective:    Patient ID: Gerald Ellis, male    DOB: June 10, 1965, 59 y.o.   MRN: [id number] Chief Complaint  Patient presents with   Follow-up    fu 6 months + ABI     HPI  Discussed the use of AI scribe software for clinical note transcription with the patient, who gave verbal consent to proceed.  History of Present Illness Gerald Ellis is a 59 year old male with peripheral artery disease and right below-knee amputation who presents for vascular surgery follow-up.  Over the past three weeks, he has resumed physical therapy following right below-knee amputation. Initially, he required a walker due to frequent buckling of the limb, but now experiences buckling only occasionally. Pain in the limb has decreased significantly, and he is able to walk farther and perform various movements during therapy. He continues to require support when ambulating and is unable to walk independently. He is working on regaining balance and strength, noting improved prosthesis function as the ligaments are stretched and restrengthened.  He has experienced systemic illness over the past ten days, with worsening symptoms in the last week, culminating in an emergency room visit last night where he was diagnosed with pneumonia. He describes persistent cough for nearly three weeks, frequent gagging, and expectoration of clear sputum, sometimes thick and sometimes runny. He notes significant congestion and swelling, and reports being told that his lymph nodes near his lungs and heart were enlarged on recent CT imaging. He denies colored sputum but occasionally vomits due to coughing. COVID and influenza testing performed last week were negative, and he was prescribed medication at that time, but subsequently developed pneumonia requiring emergency care.  He has a history of blood clots, but recent CT imaging did not show any new thromboembolic events. He expresses concern regarding lymphadenopathy around  his lungs and heart, and has been advised to follow up with his cardiologist. Over the past couple of months, he has developed deep pruritus under both arms, which he describes as a non-cutaneous, deep sensation. He is concerned about possible malignancy due to family history and recent imaging findings.    Results Diagnostic Ankle-brachial index (ABI) (06/03/2024): Right ABI 1.05, decreased from 1.21; toe pressure 0.87, increased from 0.70; normal range 0.96-1.25; strong waveforms to toes   Review of Systems  Musculoskeletal:  Positive for gait problem.  Psychiatric/Behavioral:  The patient is nervous/anxious.   All other systems reviewed and are negative.      Objective:   Physical Exam Vitals reviewed.  HENT:     Head: Normocephalic.  Cardiovascular:     Rate and Rhythm: Normal rate.  Pulmonary:     Effort: Pulmonary effort is normal.  Musculoskeletal:     Right Lower Extremity: Right leg is amputated below knee.  Skin:    General: Skin is warm and dry.  Neurological:     Mental Status: He is alert and oriented to person, place, and time.  Psychiatric:        Mood and Affect: Mood normal.        Behavior: Behavior normal.        Thought Content: Thought content normal.        Judgment: Judgment normal.     Physical Exam    BP (!) 161/82   Pulse 96   Resp 16   Ht 5' 10 (1.778 m)   BMI 34.44 kg/m   Past Medical History:  Diagnosis Date   Benign enlargement of  prostate    Bronchitis    Childhood asthma    Diabetes mellitus without complication (HCC)    DVT (deep venous thrombosis) (HCC)    Environmental allergies    Erectile dysfunction    Headache    Hemorrhoids    Hypercholesteremia    Hypertension    Hypogonadism in male    Lumbago    Over weight    PE (pulmonary thromboembolism) (HCC)     Social History   Socioeconomic History   Marital status: Married    Spouse name: Not on file   Number of children: Not on file   Years of education:  Not on file   Highest education level: Not on file  Occupational History   Not on file  Tobacco Use   Smoking status: Never   Smokeless tobacco: Never  Vaping Use   Vaping status: Never Used  Substance and Sexual Activity   Alcohol use: Yes    Alcohol/week: 0.0 standard drinks of alcohol    Comment: occasionally   Drug use: No   Sexual activity: Not Currently  Other Topics Concern   Not on file  Social History Narrative   Not on file   Social Drivers of Health   Tobacco Use: Low Risk (06/03/2024)   Patient History    Smoking Tobacco Use: Never    Smokeless Tobacco Use: Never    Passive Exposure: Not on file  Financial Resource Strain: High Risk (07/03/2023)   Received from [facility]   Overall Financial Resource Strain (CARDIA)    Difficulty of Paying Living Expenses: Hard  Food Insecurity: No Food Insecurity (01/19/2024)   Epic    Worried About Running Out of Food in the Last Year: Never true    Ran Out of Food in the Last Year: Never true  Transportation Needs: No Transportation Needs (01/19/2024)   Epic    Lack of Transportation (Medical): No    Lack of Transportation (Non-Medical): No  Physical Activity: Not on file  Stress: Not on file  Social Connections: Not on file  Intimate Partner Violence: Not At Risk (01/19/2024)   Epic    Fear of Current or Ex-Partner: No    Emotionally Abused: No    Physically Abused: No    Sexually Abused: No  Depression (PHQ2-9): Not on file  Alcohol Screen: Not on file  Housing: Low Risk (01/19/2024)   Epic    Unable to Pay for Housing in the Last Year: No    Number of Times Moved in the Last Year: 0    Homeless in the Last Year: No  Utilities: Not At Risk (01/19/2024)   Epic    Threatened with loss of utilities: No  Health Literacy: Not on file    Past Surgical History:  Procedure Laterality Date   AMPUTATION Right 05/15/2023   Procedure: AMPUTATION BELOW KNEE;  Surgeon: Marea Selinda RAMAN, MD;  Location: [facility] ORS;   Service: General;  Laterality: Right;   AMPUTATION Right 05/16/2023   Procedure: REVISION OF BELOW KNEE AMPUTATION;  Surgeon: Marea Selinda RAMAN, MD;  Location: [facility];  Service: General;  Laterality: Right;   BACK SURGERY  05/20/1990   CHOLECYSTECTOMY     COLONOSCOPY N/A 12/26/2023   Procedure: COLONOSCOPY;  Surgeon: Maryruth Ole DASEN, MD;  Location: [facility] ENDOSCOPY;  Service: Endoscopy;  Laterality: N/A;   COLONOSCOPY WITH PROPOFOL  N/A 08/07/2015   Procedure: COLONOSCOPY WITH PROPOFOL ;  Surgeon: Gladis RAYMOND Mariner, MD;  Location: [facility] ENDOSCOPY;  Service:  Endoscopy;  Laterality: N/A;   COLONOSCOPY WITH PROPOFOL  N/A 08/08/2015   Procedure: COLONOSCOPY WITH PROPOFOL ;  Surgeon: Gladis RAYMOND Mariner, MD;  Location: [facility] ENDOSCOPY;  Service: Endoscopy;  Laterality: N/A;   COLONOSCOPY WITH PROPOFOL  N/A 09/16/2017   Procedure: COLONOSCOPY WITH PROPOFOL ;  Surgeon: Mariner Gladis RAYMOND, MD;  Location: [facility] ENDOSCOPY;  Service: Endoscopy;  Laterality: N/A;   COLONOSCOPY WITH PROPOFOL  N/A 12/20/2020   Procedure: COLONOSCOPY WITH PROPOFOL ;  Surgeon: Dessa Reyes ORN, MD;  Location: [facility] ENDOSCOPY;  Service: Endoscopy;  Laterality: N/A;  IDDM   CORONARY STENT INTERVENTION N/A 04/20/2019   Procedure: CORONARY STENT INTERVENTION;  Surgeon: Ammon Blunt, MD;  Location: [facility] INVASIVE CV LAB;  Service: Cardiovascular;  Laterality: N/A;   ESOPHAGOGASTRODUODENOSCOPY N/A 12/26/2023   Procedure: EGD (ESOPHAGOGASTRODUODENOSCOPY);  Surgeon: Maryruth Ole DASEN, MD;  Location: [facility] ENDOSCOPY;  Service: Endoscopy;  Laterality: N/A;   ESOPHAGOGASTRODUODENOSCOPY (EGD) WITH PROPOFOL  N/A 08/07/2015   Procedure: ESOPHAGOGASTRODUODENOSCOPY (EGD) WITH PROPOFOL ;  Surgeon: Gladis RAYMOND Mariner, MD;  Location: [facility] ENDOSCOPY;  Service: Endoscopy;  Laterality: N/A;   ESOPHAGOGASTRODUODENOSCOPY (EGD) WITH PROPOFOL  N/A 09/16/2017   Procedure: ESOPHAGOGASTRODUODENOSCOPY (EGD) WITH PROPOFOL ;  Surgeon: Mariner Gladis RAYMOND, MD;   Location: [facility] END[facility]COPY;  Service: Endoscopy;  Laterality: N/A;   ESOPHAGOGASTRODUODENOSCOPY (EGD) WITH PROPOFOL  N/A 12/20/2020   Procedure: ESOPHAGOGASTRODUODENOSCOPY (EGD) WITH PROPOFOL ;  Surgeon: Dessa Reyes ORN, MD;  Location: [facility] ENDOSCOPY;  Service: Endoscopy;  Laterality: N/A;   HEMOSTASIS CLIP PLACEMENT  12/26/2023   Procedure: CONTROL OF HEMORRHAGE, GI TRACT, ENDOSCOPIC, BY CLIPPING OR OVERSEWING;  Surgeon: Maryruth Ole DASEN, MD;  Location: [facility] ENDOSCOPY;  Service: Endoscopy;;   IRRIGATION AND DEBRIDEMENT FOOT Right 05/12/2023   Procedure: IRRIGATION AND DEBRIDEMENT FOOT, WASH OUT;  Surgeon: Neill Boas, DPM;  Location: [facility];  Service: Orthopedics/Podiatry;  Laterality: Right;   LEFT HEART CATH AND CORONARY ANGIOGRAPHY Left 04/20/2019   Procedure: LEFT HEART CATH AND CORONARY ANGIOGRAPHY;  Surgeon: Ammon Blunt, MD;  Location: [facility];  Service: Cardiovascular;  Laterality: Left;   LOWER EXTREMITY ANGIOGRAPHY Right 05/09/2023   Procedure: Lower Extremity Angiography;  Surgeon: Jama Cordella MATSU, MD;  Location: [facility];  Service: Cardiovascular;  Laterality: Right;   POLYPECTOMY  12/26/2023   Procedure: POLYPECTOMY, INTESTINE;  Surgeon: Maryruth Ole DASEN, MD;  Location: [facility] ENDOSCOPY;  Service: Endoscopy;;   TRANSMETATARSAL AMPUTATION Right 05/08/2023   Procedure: TRANSMETATARSAL AMPUTATION;  Surgeon: Neill Boas, DPM;  Location: [facility];  Service: Orthopedics/Podiatry;  Laterality: Right;    Family History  Problem Relation Age of Onset   Heart disease Mother        CABG   Lung cancer Mother        Disseminated   Hypertension Mother    Hyperlipidemia Mother    Diabetes Father        mother   Colon cancer Father 59   Cancer Maternal Aunt        x3 aunts, unk types of cancer   Colon cancer Maternal Uncle        x3 uncles   Prostate cancer Maternal Uncle        x4 uncles   Cancer Paternal Uncle        prostate vs colon    Alzheimer's disease Other    Kidney disease Neg Hx     Allergies[1]     Latest Ref Rng & Units 06/02/2024    6:23 PM 01/21/2024    4:05 AM 01/20/2024    4:44 AM  CBC  WBC 4.0 - 10.5 K/uL 10.4  11.5  11.9   Hemoglobin 13.0 - 17.0 g/dL 86.4  88.5  88.0   Hematocrit 39.0 - 52.0 % 40.3  34.0  34.9   Platelets 150 - 400 K/uL 394  223  198       CMP     Component Value Date/Time   NA 140 06/02/2024 1823   K 3.5 06/02/2024 1823   CL 99 06/02/2024 1823   CO2 29 06/02/2024 1823   GLUCOSE 297 (H) 06/02/2024 1823   BUN 8 06/02/2024 1823   CREATININE 0.84 06/02/2024 1823   CALCIUM  8.7 (L) 06/02/2024 1823   PROT 7.5 06/02/2024 1823   ALBUMIN 3.5 06/02/2024 1823   AST 26 06/02/2024 1823   ALT 15 06/02/2024 1823   ALKPHOS 116 06/02/2024 1823   BILITOT 0.4 06/02/2024 1823   GFRNONAA >60 06/02/2024 1823     VAS US  ABI WITH/WO TBI Result Date: 06/07/2024  LOWER EXTREMITY DOPPLER STUDY Patient Name:  ZAYLIN RUNCO  Date of Exam:   06/03/2024 Medical Rec #: [id number]         Accession #:    [id number] Date of Birth: 02/09/1966        Patient Gender: M Patient Age:   8 years Exam Location:  [facility] Procedure:      VAS US  ABI WITH/WO TBI Referring Phys: SELINDA DEW --------------------------------------------------------------------------------  Indications: Peripheral artery disease. right BKA  Vascular               Right SFA/popliteal stent with subsequent BKA in Interventions:         04/2023;. Comparison Study: 11/2023 Performing Technologist: Jerel Croak RVT  Examination Guidelines: A complete evaluation includes at minimum, Doppler waveform signals and systolic blood pressure reading at the level of bilateral brachial, anterior tibial, and posterior tibial arteries, when vessel segments are accessible. Bilateral testing is considered an integral part of a complete examination. Photoelectric Plethysmograph (PPG) waveforms and toe systolic pressure readings are included as  required and additional duplex testing as needed. Limited examinations for reoccurring indications may be performed as noted.  ABI Findings: +--------+------------------+-----+--------+--------+ Right   Rt Pressure (mmHg)IndexWaveformComment  +--------+------------------+-----+--------+--------+ [id number]                                     +--------+------------------+-----+--------+--------+ +---------+------------------+-----+---------+-------+ Left     Lt Pressure (mmHg)IndexWaveform Comment +---------+------------------+-----+---------+-------+ Brachial 179                                     +---------+------------------+-----+---------+-------+ PTA      187               1.01 triphasic        +---------+------------------+-----+---------+-------+ DP       196               1.05 triphasic        +---------+------------------+-----+---------+-------+ Great Toe162               0.87 Normal           +---------+------------------+-----+---------+-------+ +-------+-----------+-----------+------------+------------+ ABI/TBIToday's ABIToday's TBIPrevious ABIPrevious TBI +-------+-----------+-----------+------------+------------+ Right  BKA                   BKA                      +-------+-----------+-----------+------------+------------+  Left   1.05       .87        1.21        .70          +-------+-----------+-----------+------------+------------+ Left ABIs and TBIs appear essentially unchanged compared to prior study on 11/2023.  Summary: Right:  BKA. Left: Resting left ankle-brachial index is within normal range. The left toe-brachial index is normal.  *See table(s) above for measurements and observations.  Electronically signed by Selinda Gu MD on 06/07/2024 at 11:12:59 AM.    Final        Assessment & Plan:   1. PAD (peripheral artery disease) (Primary) Peripheral artery disease Peripheral artery disease is well-managed with normal vascular  flow studies and improved toe flow. - Reviewed vascular flow study results confirming normal range and improved toe flow. - Provided reassurance regarding vascular status. - Scheduled follow-up in six months. - VAS US  ABI WITH/WO TBI; Future   2. Hx of right BKA (HCC) Right below-knee amputation Status post right below-knee amputation with progress in rehabilitation and prosthetic adjustment. Significant pain reduction and improved prosthetic function. - Discussed ongoing rehabilitation and prosthetic adjustment. - Encouraged continued physical therapy and stretching to optimize prosthetic fit and function. - Scheduled follow-up in six months.    Medications Ordered Prior to Encounter[2]  There are no Patient Instructions on file for this visit. Return in about 6 months (around 12/01/2024) for 6 months ABI JD/FB.   Orvin FORBES Daring, NP      [1]  Allergies Allergen Reactions   Ibuprofen Shortness Of Breath   Nsaids Swelling    Swelling of throat   Fish Oil Nausea And Vomiting, Nausea Only and Other (See Comments)    Acid reflux  [2]  Current Outpatient Medications on File Prior to Visit  Medication Sig Dispense Refill   albuterol  (VENTOLIN  HFA) 108 (90 Base) MCG/ACT inhaler Inhale 2 puffs into the lungs every 4 (four) hours as needed for wheezing or shortness of breath. 18 g 0   amoxicillin -clavulanate (AUGMENTIN ) 875-125 MG tablet Take 1 tablet by mouth every 12 (twelve) hours. 14 tablet 0   apixaban  (ELIQUIS ) 5 MG TABS tablet Take 2 tablets (10 mg total) by mouth 2 (two) times daily for 7 days, THEN 1 tablet (5 mg total) 2 (two) times daily. 60 tablet 2   aspirin  81 MG chewable tablet Chew 1 tablet (81 mg total) by mouth daily. 30 tablet 11   atorvastatin  (LIPITOR ) 80 MG tablet Take 1 tablet (80 mg total) by mouth daily at 6 PM. 30 tablet 11   budesonide -formoterol  (SYMBICORT ) 80-4.5 MCG/ACT inhaler Inhale 2 puffs into the lungs daily as needed (SOB). 1 each 1   cetirizine  (ZYRTEC) 10 MG tablet Take 10 mg by mouth daily.     Continuous Glucose Sensor (FREESTYLE LIBRE 2 SENSOR) MISC USE 1 KIT EVERY 14 (FOURTEEN) DAYS FOR GLUCOSE MONITORING     fluticasone  (FLONASE ) 50 MCG/ACT nasal spray Place 1 spray into both nostrils 2 (two) times daily as needed (nasal congestion).     gabapentin  (NEURONTIN ) 600 MG tablet Take 600 mg by mouth 3 (three) times daily.     HUMALOG KWIKPEN 200 UNIT/ML KwikPen Inject 50 Units into the skin with breakfast, with lunch, and with evening meal.     insulin  degludec (TRESIBA  FLEXTOUCH) 200 UNIT/ML FlexTouch Pen Inject 90 Units into the skin daily at 10 pm.     isosorbide  mononitrate (IMDUR ) 30 MG 24 hr tablet Take 30 mg  by mouth daily.     lisinopril  (ZESTRIL ) 20 MG tablet Take 20 mg by mouth daily.     metFORMIN (GLUCOPHAGE) 1000 MG tablet Take 1,000 mg by mouth 2 (two) times daily.      metoprolol  succinate (TOPROL -XL) 50 MG 24 hr tablet Take 1 tablet (50 mg total) by mouth daily. Take with or immediately following a meal. 30 tablet 1   naloxone (NARCAN) nasal spray 4 mg/0.1 mL Place 0.4 mg into the nose once.     oxyCODONE -acetaminophen  (PERCOCET) 10-325 MG tablet Take 1 tablet by mouth every 6 (six) hours as needed for pain.     pantoprazole  (PROTONIX ) 40 MG tablet Take 1 tablet (40 mg total) by mouth daily. 30 tablet 1   pioglitazone  (ACTOS ) 15 MG tablet Take 15 mg by mouth 2 (two) times daily.     tadalafil (CIALIS) 20 MG tablet Take 20 mg by mouth daily as needed for erectile dysfunction.     No current facility-administered medications on file prior to visit.   "

## 2024-06-14 ENCOUNTER — Ambulatory Visit

## 2024-06-16 ENCOUNTER — Ambulatory Visit

## 2024-06-21 ENCOUNTER — Ambulatory Visit: Attending: Nurse Practitioner

## 2024-06-23 ENCOUNTER — Ambulatory Visit

## 2024-06-28 ENCOUNTER — Ambulatory Visit

## 2024-06-30 ENCOUNTER — Ambulatory Visit

## 2024-06-30 ENCOUNTER — Ambulatory Visit: Attending: Nurse Practitioner

## 2024-07-05 ENCOUNTER — Ambulatory Visit

## 2024-07-07 ENCOUNTER — Ambulatory Visit

## 2024-07-12 ENCOUNTER — Ambulatory Visit

## 2024-07-14 ENCOUNTER — Ambulatory Visit

## 2024-12-01 ENCOUNTER — Ambulatory Visit (INDEPENDENT_AMBULATORY_CARE_PROVIDER_SITE_OTHER): Admitting: Nurse Practitioner

## 2024-12-01 ENCOUNTER — Encounter (INDEPENDENT_AMBULATORY_CARE_PROVIDER_SITE_OTHER)
